# Patient Record
Sex: Female | Born: 1945
Health system: Southern US, Community
[De-identification: ages and names within clinical notes are randomized; demographics above are authoritative.]

## PROBLEM LIST (undated history)

## (undated) DIAGNOSIS — I10 Essential (primary) hypertension: Secondary | ICD-10-CM

## (undated) DIAGNOSIS — K5790 Diverticulosis of intestine, part unspecified, without perforation or abscess without bleeding: Secondary | ICD-10-CM

## (undated) DIAGNOSIS — E0590 Thyrotoxicosis, unspecified without thyrotoxic crisis or storm: Secondary | ICD-10-CM

## (undated) DIAGNOSIS — E039 Hypothyroidism, unspecified: Secondary | ICD-10-CM

## (undated) DIAGNOSIS — M542 Cervicalgia: Secondary | ICD-10-CM

## (undated) DIAGNOSIS — B351 Tinea unguium: Secondary | ICD-10-CM

## (undated) DIAGNOSIS — I447 Left bundle-branch block, unspecified: Secondary | ICD-10-CM

## (undated) DIAGNOSIS — H6120 Impacted cerumen, unspecified ear: Secondary | ICD-10-CM

## (undated) DIAGNOSIS — F411 Generalized anxiety disorder: Secondary | ICD-10-CM

## (undated) DIAGNOSIS — IMO0001 Reserved for inherently not codable concepts without codable children: Secondary | ICD-10-CM

## (undated) DIAGNOSIS — K644 Residual hemorrhoidal skin tags: Secondary | ICD-10-CM

## (undated) DIAGNOSIS — E041 Nontoxic single thyroid nodule: Secondary | ICD-10-CM

## (undated) DIAGNOSIS — R197 Diarrhea, unspecified: Secondary | ICD-10-CM

## (undated) DIAGNOSIS — C801 Malignant (primary) neoplasm, unspecified: Secondary | ICD-10-CM

## (undated) DIAGNOSIS — M545 Low back pain, unspecified: Secondary | ICD-10-CM

## (undated) DIAGNOSIS — L639 Alopecia areata, unspecified: Secondary | ICD-10-CM

## (undated) DIAGNOSIS — R350 Frequency of micturition: Secondary | ICD-10-CM

## (undated) DIAGNOSIS — L84 Corns and callosities: Secondary | ICD-10-CM

## (undated) DIAGNOSIS — H8110 Benign paroxysmal vertigo, unspecified ear: Secondary | ICD-10-CM

## (undated) DIAGNOSIS — S93529A Sprain of metatarsophalangeal joint of unspecified toe(s), initial encounter: Secondary | ICD-10-CM

## (undated) DIAGNOSIS — N6029 Fibroadenosis of unspecified breast: Secondary | ICD-10-CM

## (undated) DIAGNOSIS — E559 Vitamin D deficiency, unspecified: Secondary | ICD-10-CM

## (undated) DIAGNOSIS — M810 Age-related osteoporosis without current pathological fracture: Secondary | ICD-10-CM

## (undated) DIAGNOSIS — M797 Fibromyalgia: Secondary | ICD-10-CM

## (undated) DIAGNOSIS — M7750 Other enthesopathy of unspecified foot: Secondary | ICD-10-CM

## (undated) DIAGNOSIS — J310 Chronic rhinitis: Secondary | ICD-10-CM

## (undated) DIAGNOSIS — R1310 Dysphagia, unspecified: Secondary | ICD-10-CM

## (undated) DIAGNOSIS — M1990 Unspecified osteoarthritis, unspecified site: Secondary | ICD-10-CM

## (undated) DIAGNOSIS — M069 Rheumatoid arthritis, unspecified: Secondary | ICD-10-CM

## (undated) HISTORY — DX: Fibroadenosis of unspecified breast: N60.29

## (undated) HISTORY — DX: Generalized anxiety disorder: F41.1

## (undated) HISTORY — DX: Reserved for inherently not codable concepts without codable children: IMO0001

## (undated) HISTORY — PX: EYE SURGERY: SHX253

## (undated) HISTORY — DX: Essential (primary) hypertension: I10

## (undated) HISTORY — PX: COLONOSCOPY: SHX174

## (undated) HISTORY — DX: Low back pain: M54.5

## (undated) HISTORY — DX: Thyrotoxicosis, unspecified without thyrotoxic crisis or storm: E05.90

## (undated) HISTORY — DX: Alopecia areata, unspecified: L63.9

## (undated) HISTORY — PX: DG FINGERS, MULTIPLE LT HAND (ARMC HX): HXRAD1001

## (undated) HISTORY — DX: Tinea unguium: B35.1

## (undated) HISTORY — DX: Nontoxic single thyroid nodule: E04.1

## (undated) HISTORY — DX: Corns and callosities: L84

## (undated) HISTORY — DX: Rheumatoid arthritis, unspecified: M06.9

## (undated) HISTORY — DX: Impacted cerumen, unspecified ear: H61.20

## (undated) HISTORY — DX: Diverticulosis of intestine, part unspecified, without perforation or abscess without bleeding: K57.90

## (undated) HISTORY — DX: Frequency of micturition: R35.0

## (undated) HISTORY — DX: Vitamin D deficiency, unspecified: E55.9

## (undated) HISTORY — DX: Fibromyalgia: M79.7

## (undated) HISTORY — DX: Unspecified osteoarthritis, unspecified site: M19.90

## (undated) HISTORY — DX: Sprain of metatarsophalangeal joint of unspecified toe(s), initial encounter: S93.529A

## (undated) HISTORY — DX: Benign paroxysmal vertigo, unspecified ear: H81.10

## (undated) HISTORY — DX: Age-related osteoporosis without current pathological fracture: M81.0

## (undated) HISTORY — DX: Residual hemorrhoidal skin tags: K64.4

## (undated) HISTORY — DX: Dysphagia, unspecified: R13.10

## (undated) HISTORY — DX: Left bundle-branch block, unspecified: I44.7

## (undated) HISTORY — DX: Other enthesopathy of unspecified foot and ankle: M77.50

## (undated) HISTORY — DX: Cervicalgia: M54.2

## (undated) HISTORY — DX: Low back pain, unspecified: M54.50

## (undated) HISTORY — DX: Chronic rhinitis: J31.0

---

## 1971-09-05 HISTORY — PX: TUBAL LIGATION: SHX77

## 1972-09-04 HISTORY — PX: TONSILLECTOMY AND ADENOIDECTOMY: SUR1326

## 1995-09-05 HISTORY — PX: OTHER SURGICAL HISTORY: SHX169

## 1996-03-31 DIAGNOSIS — M7750 Other enthesopathy of unspecified foot: Secondary | ICD-10-CM

## 1996-03-31 HISTORY — DX: Other enthesopathy of unspecified foot and ankle: M77.50

## 1996-09-04 HISTORY — PX: OTHER SURGICAL HISTORY: SHX169

## 1998-03-23 ENCOUNTER — Ambulatory Visit (HOSPITAL_BASED_OUTPATIENT_CLINIC_OR_DEPARTMENT_OTHER): Admission: RE | Admit: 1998-03-23 | Discharge: 1998-03-23 | Payer: Self-pay | Admitting: Orthopedic Surgery

## 1998-03-26 ENCOUNTER — Other Ambulatory Visit: Admission: RE | Admit: 1998-03-26 | Discharge: 1998-03-26 | Payer: Self-pay | Admitting: Obstetrics and Gynecology

## 1999-04-07 ENCOUNTER — Other Ambulatory Visit: Admission: RE | Admit: 1999-04-07 | Discharge: 1999-04-07 | Payer: Self-pay | Admitting: Obstetrics and Gynecology

## 1999-07-27 ENCOUNTER — Other Ambulatory Visit: Admission: RE | Admit: 1999-07-27 | Discharge: 1999-07-27 | Payer: Self-pay | Admitting: Obstetrics and Gynecology

## 2000-03-31 ENCOUNTER — Encounter: Payer: Self-pay | Admitting: Internal Medicine

## 2000-03-31 ENCOUNTER — Encounter: Admission: RE | Admit: 2000-03-31 | Discharge: 2000-03-31 | Payer: Self-pay | Admitting: Internal Medicine

## 2000-04-05 ENCOUNTER — Encounter: Payer: Self-pay | Admitting: Internal Medicine

## 2000-04-05 ENCOUNTER — Encounter: Admission: RE | Admit: 2000-04-05 | Discharge: 2000-04-05 | Payer: Self-pay | Admitting: Internal Medicine

## 2000-07-06 ENCOUNTER — Encounter: Payer: Self-pay | Admitting: Obstetrics and Gynecology

## 2000-07-06 ENCOUNTER — Encounter: Admission: RE | Admit: 2000-07-06 | Discharge: 2000-07-06 | Payer: Self-pay | Admitting: Obstetrics and Gynecology

## 2000-07-31 ENCOUNTER — Other Ambulatory Visit: Admission: RE | Admit: 2000-07-31 | Discharge: 2000-07-31 | Payer: Self-pay | Admitting: Obstetrics and Gynecology

## 2000-12-04 ENCOUNTER — Encounter: Payer: Self-pay | Admitting: Neurosurgery

## 2000-12-04 ENCOUNTER — Encounter: Admission: RE | Admit: 2000-12-04 | Discharge: 2000-12-04 | Payer: Self-pay | Admitting: Neurosurgery

## 2000-12-10 ENCOUNTER — Encounter: Admission: RE | Admit: 2000-12-10 | Discharge: 2000-12-10 | Payer: Self-pay | Admitting: Neurosurgery

## 2000-12-10 ENCOUNTER — Encounter: Payer: Self-pay | Admitting: Neurosurgery

## 2001-07-15 ENCOUNTER — Encounter: Payer: Self-pay | Admitting: Neurosurgery

## 2001-07-15 ENCOUNTER — Encounter: Admission: RE | Admit: 2001-07-15 | Discharge: 2001-07-15 | Payer: Self-pay | Admitting: Neurosurgery

## 2001-07-17 ENCOUNTER — Encounter: Admission: RE | Admit: 2001-07-17 | Discharge: 2001-07-17 | Payer: Self-pay | Admitting: Obstetrics and Gynecology

## 2001-07-17 ENCOUNTER — Encounter: Payer: Self-pay | Admitting: Obstetrics and Gynecology

## 2001-09-12 ENCOUNTER — Other Ambulatory Visit: Admission: RE | Admit: 2001-09-12 | Discharge: 2001-09-12 | Payer: Self-pay | Admitting: Obstetrics and Gynecology

## 2002-08-22 ENCOUNTER — Encounter: Admission: RE | Admit: 2002-08-22 | Discharge: 2002-08-22 | Payer: Self-pay | Admitting: Obstetrics and Gynecology

## 2002-08-22 ENCOUNTER — Encounter: Payer: Self-pay | Admitting: Obstetrics and Gynecology

## 2002-09-01 ENCOUNTER — Ambulatory Visit (HOSPITAL_COMMUNITY): Admission: RE | Admit: 2002-09-01 | Discharge: 2002-09-01 | Payer: Self-pay | Admitting: Neurosurgery

## 2002-09-04 DIAGNOSIS — I10 Essential (primary) hypertension: Secondary | ICD-10-CM

## 2002-09-04 DIAGNOSIS — F411 Generalized anxiety disorder: Secondary | ICD-10-CM

## 2002-09-04 DIAGNOSIS — R1310 Dysphagia, unspecified: Secondary | ICD-10-CM

## 2002-09-04 HISTORY — DX: Generalized anxiety disorder: F41.1

## 2002-09-04 HISTORY — DX: Essential (primary) hypertension: I10

## 2002-09-04 HISTORY — DX: Dysphagia, unspecified: R13.10

## 2003-02-04 ENCOUNTER — Other Ambulatory Visit: Admission: RE | Admit: 2003-02-04 | Discharge: 2003-02-04 | Payer: Self-pay | Admitting: Obstetrics and Gynecology

## 2003-04-01 DIAGNOSIS — M1990 Unspecified osteoarthritis, unspecified site: Secondary | ICD-10-CM

## 2003-04-01 DIAGNOSIS — M810 Age-related osteoporosis without current pathological fracture: Secondary | ICD-10-CM

## 2003-04-01 DIAGNOSIS — H8110 Benign paroxysmal vertigo, unspecified ear: Secondary | ICD-10-CM

## 2003-04-01 DIAGNOSIS — N6029 Fibroadenosis of unspecified breast: Secondary | ICD-10-CM

## 2003-04-01 DIAGNOSIS — M542 Cervicalgia: Secondary | ICD-10-CM

## 2003-04-01 HISTORY — DX: Unspecified osteoarthritis, unspecified site: M19.90

## 2003-04-01 HISTORY — DX: Fibroadenosis of unspecified breast: N60.29

## 2003-04-01 HISTORY — DX: Cervicalgia: M54.2

## 2003-04-01 HISTORY — DX: Age-related osteoporosis without current pathological fracture: M81.0

## 2003-04-01 HISTORY — DX: Benign paroxysmal vertigo, unspecified ear: H81.10

## 2003-08-26 ENCOUNTER — Encounter: Admission: RE | Admit: 2003-08-26 | Discharge: 2003-08-26 | Payer: Self-pay | Admitting: Obstetrics and Gynecology

## 2004-02-18 ENCOUNTER — Other Ambulatory Visit: Admission: RE | Admit: 2004-02-18 | Discharge: 2004-02-18 | Payer: Self-pay | Admitting: Obstetrics and Gynecology

## 2004-10-05 DIAGNOSIS — M069 Rheumatoid arthritis, unspecified: Secondary | ICD-10-CM

## 2004-10-05 HISTORY — DX: Rheumatoid arthritis, unspecified: M06.9

## 2005-02-21 ENCOUNTER — Other Ambulatory Visit: Admission: RE | Admit: 2005-02-21 | Discharge: 2005-02-21 | Payer: Self-pay | Admitting: Obstetrics and Gynecology

## 2005-03-01 DIAGNOSIS — IMO0001 Reserved for inherently not codable concepts without codable children: Secondary | ICD-10-CM

## 2005-03-01 HISTORY — DX: Reserved for inherently not codable concepts without codable children: IMO0001

## 2005-09-01 DIAGNOSIS — J310 Chronic rhinitis: Secondary | ICD-10-CM

## 2005-09-01 HISTORY — DX: Chronic rhinitis: J31.0

## 2005-11-28 ENCOUNTER — Ambulatory Visit: Payer: Self-pay | Admitting: Internal Medicine

## 2005-12-01 ENCOUNTER — Ambulatory Visit: Payer: Self-pay | Admitting: Internal Medicine

## 2005-12-01 ENCOUNTER — Encounter (INDEPENDENT_AMBULATORY_CARE_PROVIDER_SITE_OTHER): Payer: Self-pay | Admitting: *Deleted

## 2005-12-01 LAB — HM COLONOSCOPY

## 2005-12-08 ENCOUNTER — Ambulatory Visit (HOSPITAL_COMMUNITY): Admission: RE | Admit: 2005-12-08 | Discharge: 2005-12-08 | Payer: Self-pay | Admitting: Internal Medicine

## 2006-01-01 ENCOUNTER — Ambulatory Visit: Payer: Self-pay | Admitting: Internal Medicine

## 2006-02-07 ENCOUNTER — Ambulatory Visit: Payer: Self-pay | Admitting: Internal Medicine

## 2006-02-16 DIAGNOSIS — L639 Alopecia areata, unspecified: Secondary | ICD-10-CM

## 2006-02-16 HISTORY — DX: Alopecia areata, unspecified: L63.9

## 2006-05-11 ENCOUNTER — Encounter: Admission: RE | Admit: 2006-05-11 | Discharge: 2006-05-11 | Payer: Self-pay | Admitting: Internal Medicine

## 2007-01-04 ENCOUNTER — Ambulatory Visit: Payer: Self-pay | Admitting: Internal Medicine

## 2009-07-16 ENCOUNTER — Encounter: Admission: RE | Admit: 2009-07-16 | Discharge: 2009-07-16 | Payer: Self-pay | Admitting: Neurosurgery

## 2009-11-19 ENCOUNTER — Encounter: Admission: RE | Admit: 2009-11-19 | Discharge: 2009-11-19 | Payer: Self-pay | Admitting: Obstetrics and Gynecology

## 2010-10-31 ENCOUNTER — Other Ambulatory Visit: Payer: Self-pay | Admitting: Obstetrics and Gynecology

## 2010-10-31 DIAGNOSIS — Z1231 Encounter for screening mammogram for malignant neoplasm of breast: Secondary | ICD-10-CM

## 2010-12-05 ENCOUNTER — Ambulatory Visit
Admission: RE | Admit: 2010-12-05 | Discharge: 2010-12-05 | Disposition: A | Discharge status: Home / Self Care | Payer: Medicare Other | Source: Ambulatory Visit | Attending: Obstetrics and Gynecology | Admitting: Obstetrics and Gynecology

## 2010-12-05 DIAGNOSIS — Z1231 Encounter for screening mammogram for malignant neoplasm of breast: Secondary | ICD-10-CM

## 2011-01-20 NOTE — Assessment & Plan Note (Signed)
[name]                         GASTROENTEROLOGY OFFICE NOTE   Joan, [name]                       MRN:          [id number]  DATE:01/04/2007                            DOB:          October 11, 1945    REFERRING PHYSICIAN:  Lemmie Evens, M.D.   PROBLEM:  Iron-deficiency anemia.   HISTORY:  Joan [name] is a very nice 65 year old white female known to Dr.  [name] from workup approximately one year ago at which time she was seen  for microcytic anemia. She underwent colonoscopy which showed sigmoid  colon diverticulosis and internal hemorrhoids and also had an upper  endoscopy which revealed an esophageal stricture which was dilated in  evidence for GERD.  The patient subsequently had small-bowel follow-  through which was also negative. She was treated with oral iron  supplementation.  She says that she thinks she took the iron for 3-4  months and then stopped taking it as her hemoglobin had returned to  normal.  She had recently had labs done with Dr. Jimmy Footman and was found  again to be anemic and, therefore, resumed iron tablets at 325 three  times a day.  Her most recent hemoglobin was 10.6, hematocrit 32.1, and  MCV 79.4.  If she had iron studies done, we do not have the results of  those today.  The patient says she feels fine and has no GI symptoms.  She has noted that her stool has been dark since starting the iron.  She  has ongoing difficulty with rheumatoid arthritis and fibromyalgia.   CURRENT MEDICATIONS:  1. Levothyroxine, uncertain dosage, 6 days per week.  2. Folic acid 1 mg daily.  3. Amitriptyline 10 mg nightly.  4. Arthrotec 75 mg daily.  5. Fosamax weekly.  6. Ferrous sulfate 325 mg 3 times a day.  7. Prednisone 5 mg daily.  8. Trental 50 b.i.d. p.r.n.  9. Enbrel weekly.   ALLERGIES:  Intolerance to PENICILLIN with rash.   PHYSICAL EXAMINATION:  GENERAL:  Well-developed, small white female in  no acute distress.  VITAL  SIGNS:  Weight 106. Blood pressure 124/82, pulse 92.  CARDIOVASCULAR:  Regular rate and rhythm with S1 and S2.  No murmurs,  rubs, or gallops.  PULMONARY:  Clear to auscultation and percussion.  ABDOMEN: Soft and nontender.  There is no palpable mass or  hepatosplenomegaly.  RECTAL:  Exam not done today.   IMPRESSION:  A 65 year old white female with recurrent microcytic  anemia, suspect iron deficiency anemia. Negative workup one year ago.  Suspect that she does have chronic intermittent blood loss with chronic  NSAID use accounting for recurrence of iron-deficiency anemia .   PLAN:  1. Continue ferrous sulfate 325 mg 3 times a day x4 months and repeat      hemoglobin at that time.  If her hemoglobin has normalized, we have      asked her to continue iron supplement but decrease dose to 325 once      daily or the minimal dose required to maintain a normal hemoglobin.  2. In the event that her hemoglobin and  iron studies do not correct,      please refer her back and will consider repeat endoscopy and/or      capsule endoscopy for further evaluation.  However, at this point,      do not feel that further endoscopic evaluation is warranted.      Mike Gip, PA-C  Electronically Signed      Wilhemina Bonito. [name], MD  Electronically Signed   AE/MedQ  DD: 01/07/2007  DT: 01/07/2007  Job #: [id number]   cc:   Lemmie Evens, M.D.  Lenon Curt Chilton Si, M.D.

## 2011-04-25 ENCOUNTER — Other Ambulatory Visit: Payer: Self-pay | Admitting: Obstetrics and Gynecology

## 2011-09-07 ENCOUNTER — Other Ambulatory Visit: Payer: Self-pay | Admitting: Obstetrics and Gynecology

## 2011-09-07 DIAGNOSIS — R102 Pelvic and perineal pain: Secondary | ICD-10-CM

## 2011-09-11 ENCOUNTER — Ambulatory Visit
Admission: RE | Admit: 2011-09-11 | Discharge: 2011-09-11 | Disposition: A | Discharge status: Home / Self Care | Payer: Medicare Other | Source: Ambulatory Visit | Attending: Obstetrics and Gynecology | Admitting: Obstetrics and Gynecology

## 2011-09-11 DIAGNOSIS — R102 Pelvic and perineal pain: Secondary | ICD-10-CM

## 2011-09-11 MED ORDER — IOHEXOL 300 MG/ML  SOLN
100.0000 mL | Freq: Once | INTRAMUSCULAR | Status: AC | PRN
  Administered 2011-09-11: 100 mL via INTRAVENOUS

## 2011-11-07 ENCOUNTER — Other Ambulatory Visit: Payer: Self-pay | Admitting: Obstetrics and Gynecology

## 2011-11-07 DIAGNOSIS — Z1231 Encounter for screening mammogram for malignant neoplasm of breast: Secondary | ICD-10-CM

## 2011-12-12 ENCOUNTER — Ambulatory Visit: Payer: Medicare Other

## 2011-12-15 ENCOUNTER — Ambulatory Visit
Admission: RE | Admit: 2011-12-15 | Discharge: 2011-12-15 | Disposition: A | Discharge status: Home / Self Care | Payer: Medicare Other | Source: Ambulatory Visit | Attending: Obstetrics and Gynecology | Admitting: Obstetrics and Gynecology

## 2011-12-15 DIAGNOSIS — Z1231 Encounter for screening mammogram for malignant neoplasm of breast: Secondary | ICD-10-CM

## 2012-05-22 ENCOUNTER — Other Ambulatory Visit: Payer: Self-pay | Admitting: Neurosurgery

## 2012-05-22 DIAGNOSIS — M549 Dorsalgia, unspecified: Secondary | ICD-10-CM

## 2012-05-31 ENCOUNTER — Ambulatory Visit
Admission: RE | Admit: 2012-05-31 | Discharge: 2012-05-31 | Disposition: A | Discharge status: Home / Self Care | Payer: Medicare Other | Source: Ambulatory Visit | Attending: Neurosurgery | Admitting: Neurosurgery

## 2012-05-31 DIAGNOSIS — M549 Dorsalgia, unspecified: Secondary | ICD-10-CM

## 2012-05-31 MED ORDER — IOHEXOL 180 MG/ML  SOLN
1.0000 mL | Freq: Once | INTRAMUSCULAR | Status: AC | PRN
  Administered 2012-05-31: 1 mL via EPIDURAL

## 2012-05-31 MED ORDER — METHYLPREDNISOLONE ACETATE 40 MG/ML INJ SUSP (RADIOLOG
120.0000 mg | Freq: Once | INTRAMUSCULAR | Status: AC
  Administered 2012-05-31: 120 mg via EPIDURAL

## 2012-05-31 NOTE — Discharge Instructions (Signed)

## 2012-11-01 ENCOUNTER — Encounter: Payer: Self-pay | Admitting: *Deleted

## 2012-11-07 ENCOUNTER — Other Ambulatory Visit: Payer: Self-pay

## 2012-11-07 DIAGNOSIS — Z1231 Encounter for screening mammogram for malignant neoplasm of breast: Secondary | ICD-10-CM

## 2012-11-21 ENCOUNTER — Other Ambulatory Visit: Payer: Self-pay | Admitting: Geriatric Medicine

## 2012-11-21 DIAGNOSIS — E0590 Thyrotoxicosis, unspecified without thyrotoxic crisis or storm: Secondary | ICD-10-CM

## 2012-11-29 ENCOUNTER — Other Ambulatory Visit: Payer: Self-pay

## 2012-11-29 DIAGNOSIS — Z1231 Encounter for screening mammogram for malignant neoplasm of breast: Secondary | ICD-10-CM

## 2012-11-29 DIAGNOSIS — E0590 Thyrotoxicosis, unspecified without thyrotoxic crisis or storm: Secondary | ICD-10-CM

## 2012-11-30 LAB — TSH: TSH: 0.007 u[IU]/mL — ABNORMAL LOW (ref 0.450–4.500)

## 2012-12-03 ENCOUNTER — Encounter: Payer: Self-pay | Admitting: Internal Medicine

## 2012-12-03 ENCOUNTER — Ambulatory Visit (INDEPENDENT_AMBULATORY_CARE_PROVIDER_SITE_OTHER): Payer: Medicare Other | Admitting: Internal Medicine

## 2012-12-03 ENCOUNTER — Other Ambulatory Visit: Payer: Self-pay | Admitting: Internal Medicine

## 2012-12-03 VITALS — BP 118/62 | HR 82 | Temp 98.5°F | Resp 18 | Ht 60.0 in | Wt 99.0 lb

## 2012-12-03 DIAGNOSIS — E0590 Thyrotoxicosis, unspecified without thyrotoxic crisis or storm: Secondary | ICD-10-CM | POA: Insufficient documentation

## 2012-12-03 NOTE — Patient Instructions (Signed)
Do not take any thyroid supplements

## 2012-12-03 NOTE — Progress Notes (Signed)
Subjective:     Patient ID: Joan [name], female   DOB: 1945/10/15, 67 y.o.   MRN: [id number]  HPI Current lab work shows an extremely low TSH of 0.007. She had an episode of hyperthyroidism several years ago and was evaluated with multiple tests at that time. More recently she has been hypothyroid. She denies any pain in the neck, tremor, headaches, difficulty swallowing, painful swallowing, shortness of breath, palpitations, significant weight loss, or GI symptoms. She stopped taking thyroid supplements approximately 6 weeks ago. Lab work continues to show the low TSH. Current Outpatient Prescriptions on File Prior to Visit  Medication Sig Dispense Refill  . traMADol (ULTRAM) 50 MG tablet Take 50 mg by mouth 4 (four) times daily. To help with pain       No current facility-administered medications on file prior to visit.    Review of Systems  Constitutional: Negative for fever, chills, weight loss and malaise/fatigue.  HENT: Negative for hearing loss, ear pain, congestion, sore throat, neck pain, tinnitus and ear discharge.   Eyes: Negative for blurred vision, double vision, photophobia and pain.  Respiratory: Negative for cough, hemoptysis, sputum production, shortness of breath and wheezing.   Cardiovascular: Negative for chest pain, palpitations, orthopnea, claudication, leg swelling and PND.  Gastrointestinal: Negative for heartburn, nausea, vomiting, abdominal pain, diarrhea and constipation.  Endocrine: Negative for polydipsia.  Genitourinary: Negative for dysuria, urgency and frequency.  Musculoskeletal: Positive for myalgias, back pain and joint pain. Negative for falls.  Skin: Negative for itching and rash.  Allergic/Immunologic: Negative for environmental allergies.  Neurological: Negative for dizziness, tingling, tremors, sensory change, speech change, focal weakness, seizures, loss of consciousness, weakness and headaches.  Hematological: Does not bruise/bleed easily.   Psychiatric/Behavioral: Negative for depression, suicidal ideas, hallucinations and memory loss. The patient is nervous/anxious. The patient does not have insomnia.    Review of Systems  Constitutional: Negative for fever, chills, weight loss and malaise/fatigue.  HENT: Negative for hearing loss, ear pain, congestion, sore throat, neck pain, tinnitus and ear discharge.   Eyes: Negative for blurred vision, double vision, photophobia and pain.  Respiratory: Negative for cough, hemoptysis, sputum production, shortness of breath and wheezing.   Cardiovascular: Negative for chest pain, palpitations, orthopnea, claudication, leg swelling and PND.  Gastrointestinal: Negative for heartburn, nausea, vomiting, abdominal pain, diarrhea and constipation.  Genitourinary: Negative for dysuria, urgency and frequency.  Musculoskeletal: Positive for myalgias, back pain and joint pain. Negative for falls.  Skin: Negative for itching and rash.  Neurological: Negative for dizziness, tingling, tremors, sensory change, speech change, focal weakness, seizures, loss of consciousness, weakness and headaches.  Endo/Heme/Allergies: Negative for environmental allergies and polydipsia. Does not bruise/bleed easily.  Psychiatric/Behavioral: Negative for depression, suicidal ideas, hallucinations and memory loss. The patient is nervous/anxious. The patient does not have insomnia.       Objective:BP 118/62  Pulse 82  Temp(Src) 98.5 F (36.9 C) (Oral)  Resp 18  Ht 5' (1.524 m)  Wt 99 lb (44.906 kg)  BMI 19.33 kg/m2  SpO2 99%    Physical Exam  Constitutional: She is oriented to person, place, and time. She appears well-developed and well-nourished.  HENT:  Head: Normocephalic.  Right Ear: External ear normal.  Left Ear: External ear normal.  Mouth/Throat: No oropharyngeal exudate.  Eyes: Conjunctivae and EOM are normal. Pupils are equal, round, and reactive to light.  Neck: Neck supple. No tracheal deviation  present. Thyromegaly present.  Cardiovascular: Normal rate, regular rhythm, normal heart sounds and intact distal  pulses.   Pulmonary/Chest: Effort normal and breath sounds normal. No stridor.  Abdominal: Soft. Bowel sounds are normal.  Musculoskeletal: She exhibits no edema and no tenderness.  Diffuse arthralgias. History of rheumatoid arthritis. Currently on Enbrel. Under the care of Dr. Dierdre Forth.  Lymphadenopathy:    She has no cervical adenopathy.  Neurological: She is oriented to person, place, and time. She has normal reflexes. No cranial nerve deficit. Coordination normal.  Skin: Skin is warm and dry. No rash noted.  Psychiatric: She has a normal mood and affect. Her behavior is normal. Judgment and thought content normal.       Assessment:     Asymptomatic hyperthyroid state    Plan:    Additional studies have been ordered: Radioactive iodine uptake, thyroid ultrasound, anti-thyroid peroxidase antibodies, TSH receptor antibodies, thyroid anti-thyroglobulin antibodies. Patient will return after completion of the studies.

## 2012-12-06 ENCOUNTER — Other Ambulatory Visit: Payer: Self-pay | Admitting: Geriatric Medicine

## 2012-12-06 DIAGNOSIS — E0590 Thyrotoxicosis, unspecified without thyrotoxic crisis or storm: Secondary | ICD-10-CM

## 2012-12-06 NOTE — Addendum Note (Signed)
Addended by: Conception Chancy R on: 12/06/2012 04:10 PM   Modules accepted: Orders

## 2012-12-18 ENCOUNTER — Encounter: Payer: Self-pay | Admitting: Internal Medicine

## 2012-12-18 DIAGNOSIS — E0590 Thyrotoxicosis, unspecified without thyrotoxic crisis or storm: Secondary | ICD-10-CM | POA: Insufficient documentation

## 2012-12-18 DIAGNOSIS — E041 Nontoxic single thyroid nodule: Secondary | ICD-10-CM | POA: Insufficient documentation

## 2012-12-18 DIAGNOSIS — I10 Essential (primary) hypertension: Secondary | ICD-10-CM | POA: Insufficient documentation

## 2012-12-18 LAB — THYROGLOBULIN ANTIBODY: Thyroglobulin Ab: 1.6 IU/mL — ABNORMAL HIGH (ref 0.0–0.9)

## 2012-12-18 LAB — THYROTROPIN RECEPTOR ANTIBODY: Thyrotropin Receptor Ab: 2.46 IU/L — ABNORMAL HIGH (ref 0.00–1.75)

## 2012-12-18 LAB — THYROID PEROXIDASE ANTIBODY: Thyroid Peroxidase Ab: 28 IU/mL (ref 0–34)

## 2012-12-18 NOTE — Addendum Note (Signed)
Addended by: Conception Chancy R on: 12/18/2012 03:47 PM   Modules accepted: Orders

## 2012-12-23 ENCOUNTER — Ambulatory Visit
Admission: RE | Admit: 2012-12-23 | Discharge: 2012-12-23 | Disposition: A | Discharge status: Home / Self Care | Payer: Medicare Other | Source: Ambulatory Visit

## 2012-12-26 ENCOUNTER — Ambulatory Visit (HOSPITAL_COMMUNITY)
Admission: RE | Admit: 2012-12-26 | Discharge: 2012-12-26 | Disposition: A | Discharge status: Home / Self Care | Payer: Medicare Other | Source: Ambulatory Visit | Attending: Internal Medicine | Admitting: Internal Medicine

## 2012-12-26 DIAGNOSIS — E0590 Thyrotoxicosis, unspecified without thyrotoxic crisis or storm: Secondary | ICD-10-CM

## 2012-12-26 DIAGNOSIS — E0510 Thyrotoxicosis with toxic single thyroid nodule without thyrotoxic crisis or storm: Secondary | ICD-10-CM | POA: Insufficient documentation

## 2013-01-06 ENCOUNTER — Encounter (HOSPITAL_COMMUNITY)
Admission: RE | Admit: 2013-01-06 | Discharge: 2013-01-06 | Disposition: A | Discharge status: Home / Self Care | Payer: Medicare Other | Source: Ambulatory Visit | Attending: Internal Medicine | Admitting: Internal Medicine

## 2013-01-06 ENCOUNTER — Encounter (HOSPITAL_COMMUNITY): Payer: Medicare Other

## 2013-01-06 DIAGNOSIS — E0500 Thyrotoxicosis with diffuse goiter without thyrotoxic crisis or storm: Secondary | ICD-10-CM | POA: Insufficient documentation

## 2013-01-06 DIAGNOSIS — E0590 Thyrotoxicosis, unspecified without thyrotoxic crisis or storm: Secondary | ICD-10-CM

## 2013-01-07 ENCOUNTER — Encounter (HOSPITAL_COMMUNITY): Payer: Medicare Other

## 2013-01-07 ENCOUNTER — Encounter (HOSPITAL_COMMUNITY)
Admission: RE | Admit: 2013-01-07 | Discharge: 2013-01-07 | Disposition: A | Discharge status: Home / Self Care | Payer: Medicare Other | Source: Ambulatory Visit | Attending: Internal Medicine | Admitting: Internal Medicine

## 2013-01-07 MED ORDER — SODIUM IODIDE I 131 CAPSULE
8.4000 | Freq: Once | INTRAVENOUS | Status: AC | PRN
  Administered 2013-01-06: 8.4 via ORAL

## 2013-01-07 MED ORDER — SODIUM PERTECHNETATE TC 99M INJECTION
10.4000 | Freq: Once | INTRAVENOUS | Status: AC | PRN
  Administered 2013-01-07: 10 via INTRAVENOUS

## 2013-01-15 ENCOUNTER — Encounter: Payer: Self-pay | Admitting: Internal Medicine

## 2013-01-15 ENCOUNTER — Encounter: Payer: Self-pay | Admitting: *Deleted

## 2013-01-15 ENCOUNTER — Ambulatory Visit (INDEPENDENT_AMBULATORY_CARE_PROVIDER_SITE_OTHER): Payer: Medicare Other | Admitting: Internal Medicine

## 2013-01-15 VITALS — BP 118/72 | HR 88 | Temp 98.5°F | Resp 14 | Ht 60.0 in | Wt 100.2 lb

## 2013-01-15 DIAGNOSIS — I10 Essential (primary) hypertension: Secondary | ICD-10-CM

## 2013-01-15 DIAGNOSIS — IMO0001 Reserved for inherently not codable concepts without codable children: Secondary | ICD-10-CM

## 2013-01-15 DIAGNOSIS — E0590 Thyrotoxicosis, unspecified without thyrotoxic crisis or storm: Secondary | ICD-10-CM

## 2013-01-15 DIAGNOSIS — R197 Diarrhea, unspecified: Secondary | ICD-10-CM

## 2013-01-15 DIAGNOSIS — M069 Rheumatoid arthritis, unspecified: Secondary | ICD-10-CM

## 2013-01-15 DIAGNOSIS — E041 Nontoxic single thyroid nodule: Secondary | ICD-10-CM | POA: Insufficient documentation

## 2013-01-15 MED ORDER — DIPHENOXYLATE-ATROPINE 2.5-0.025 MG PO TABS: 1.0000 | ORAL_TABLET | Freq: Four times a day (QID) | ORAL | Status: DC | PRN

## 2013-01-15 MED ORDER — PREDNISONE 5 MG PO TABS: 5.0000 mg | ORAL_TABLET | Freq: Every day | ORAL | Status: DC

## 2013-01-15 NOTE — Patient Instructions (Signed)
See Dr Altheimer about the thyroid nodule.

## 2013-01-15 NOTE — Progress Notes (Signed)
Subjective:    Patient ID: Joan [name], female    DOB: 02-25-1946, 67 y.o.   MRN: [id number]  HPI Has a cold nodule on recent evaluation of hyperthyroid state. Has a tremor, but it has not increased. Some intolerance to heat. Denies difficulty sleeping. Complains of diarrhea. Multiple loose stools. No blood. History of diverticulosis. Has not responded to Imodium. BP doing OK. Sticking with Enbrel for RA. Sees Dr. Dierdre Forth.  Review of Systems  Constitutional: Positive for unexpected weight change. Negative for fever, chills, weight loss and malaise/fatigue.       Losing weight.  HENT: Negative for hearing loss, ear pain, congestion, sore throat, neck pain, tinnitus and ear discharge.   Eyes: Negative for blurred vision, double vision, photophobia and pain.  Respiratory: Negative for cough, hemoptysis, sputum production, shortness of breath and wheezing.   Cardiovascular: Negative for chest pain, palpitations, orthopnea, claudication, leg swelling and PND.  Gastrointestinal: Positive for diarrhea. Negative for heartburn, nausea, vomiting, abdominal pain and constipation.  Endocrine: Negative for polydipsia.  Genitourinary: Negative for dysuria, urgency and frequency.  Musculoskeletal: Positive for myalgias, back pain and joint pain. Negative for falls.  Skin: Negative for itching and rash.  Allergic/Immunologic: Negative for environmental allergies.  Neurological: Negative for dizziness, tingling, tremors, sensory change, speech change, focal weakness, seizures, loss of consciousness, weakness and headaches.  Hematological: Does not bruise/bleed easily.  Psychiatric/Behavioral: Negative for depression, suicidal ideas, hallucinations and memory loss. The patient is nervous/anxious. The patient does not have insomnia.          Objective:BP 118/72  Pulse 88  Temp(Src) 98.5 F (36.9 C) (Oral)  Resp 14  Ht 5' (1.524 m)  Wt 100 lb 3.2 oz (45.45 kg)  BMI 19.57 kg/m2    Physical  Exam  Constitutional: She is oriented to person, place, and time. She appears well-developed and well-nourished.  HENT:  Head: Normocephalic.  Right Ear: External ear normal.  Left Ear: External ear normal.  Mouth/Throat: No oropharyngeal exudate.  Eyes: Conjunctivae and EOM are normal. Pupils are equal, round, and reactive to light.  Neck: Neck supple. No tracheal deviation present. Thyromegaly present.  Cardiovascular: Normal rate, regular rhythm, normal heart sounds and intact distal pulses.   Pulmonary/Chest: Effort normal and breath sounds normal. No stridor.  Abdominal: Soft. Bowel sounds are normal.  Musculoskeletal: She exhibits no edema and no tenderness.  Diffuse arthralgias. History of rheumatoid arthritis. Currently on Enbrel. Under the care of Dr. Dierdre Forth.  Lymphadenopathy:    She has no cervical adenopathy.  Neurological: She is oriented to person, place, and time. She has normal reflexes. No cranial nerve deficit. Coordination normal.  Skin: Skin is warm and dry. No rash noted.  Psychiatric: She has a normal mood and affect. Her behavior is normal. Judgment and thought content normal.    Orders Only on 12/03/2012  Component Date Value Range Status  . Thyroglobulin Ab 12/03/2012 1.6* 0.0 - 0.9 IU/mL Final   Comment: Low positive Thyroglobulin antibodies are seen in a portion of the                          asymptomatic populations.                          Antithyroglobulin antibodies measured by [facility] Methodology  . Thyrotropin Receptor Ab 12/03/2012 2.46* 0.00 - 1.75 IU/L Final  . Thyroid Peroxidase Ab 12/03/2012 28  0 -  34 IU/mL Final  Appointment on 11/29/2012  Component Date Value Range Status  . TSH 11/29/2012 0.007* 0.450 - 4.500 uIU/mL Final   Orders Only on 12/03/2012  Component Date Value Range Status  . Thyroglobulin Ab 12/03/2012 1.6* 0.0 - 0.9 IU/mL Final   Comment: Low positive Thyroglobulin antibodies are seen in a portion of the                           asymptomatic populations.                          Antithyroglobulin antibodies measured by [facility] Methodology  . Thyrotropin Receptor Ab 12/03/2012 2.46* 0.00 - 1.75 IU/L Final  . Thyroid Peroxidase Ab 12/03/2012 28  0 - 34 IU/mL Final  Appointment on 11/29/2012  Component Date Value Range Status  . TSH 11/29/2012 0.007* 0.450 - 4.500 uIU/mL Final   01/07/13 Thyroid ULTRASOUND   Technique: Ultrasound examination of the thyroid gland and adjacent soft tissues was performed.   Comparison:  None   Findings:   Right thyroid lobe:  4.9 x 2.1 x 2.7 cm.  Heterogeneous echogenicity. Left thyroid lobe:  5.0 x 2.7 x 1.8 cm.  Heterogeneous echogenicity. Isthmus:  4 mm thick   Focal nodules:  Isoechoic to hypoechoic noncalcified nodule at mid to inferior right thyroid lobe, 2.7 x 1.6 x 2.1 cm.  No additional discrete thyroid mass/nodule, cyst or calcification.   Lymphadenopathy:  None identified   IMPRESSION: Solitary right thyroid nodule 2.7 x 1.6 x 2.1 cm. Findings meet consensus criteria for biopsy.  Ultrasound-guided fine needle aspiration should be considered, as per the consensus statement: Management of Thyroid Nodules Detected at [place]:  Society of Radiologists in Ultrasound Consensus Conference Statement. Radiology 2005; 161:096-045.  12/26/12 THYROID SCAN AND UPTAKE - 24 HOURS   Technique:  Following the per oral administration of I-131 Sodium Iodide, the patient returned at 24 hours and uptake measurements were acquired with the uptake probe centered on the neck.  Thyroid imaging was performed following the intravenous administration of the Tc-70m Pertechnetate.   Radiopharmaceuticals: 8.4uCi I-131 Sodium Iodide orally and10.80mCi Tc-68m Pertechnetate intravenously.   Comparison:  Thyroid ultrasound 12/26/2012.   Findings:  The 24 hour uptake by the thyroid gland is 14.3%. Normal 24 hour uptake range is 10-35%.   The thyroid scan demonstrates fairly  homogeneous activity throughout the left lobe.  There is decreased activity throughout much of the right lobe, suspicious for a cold right thyroid nodule as correlated with prior ultrasound.  No focally increased activity is identified.   IMPRESSION: 1.  Normal (14%) 24 hour radioiodine uptake by the thyroid gland.   2.  Decreased activity in the right thyroid lobe suspicious for a cold nodule corresponding with the 2.7 cm lesion demonstrated on ultrasound.  This increases the need for fine-needle aspiration.        Assessment & Plan:  Hyperthyroidism  See endocrine Thyrotoxicosis  See endocrine Myalgia and myositis, unspecified  unchanged Rheumatoid arthritis  Continue with Dr. Dierdre Forth Nontoxic uninodular goiter  Now in toxic range. Hypertension  stable Cold thyroid nodule  Needs biopsy. Referred to endocrine. Patient would like Dr. Leslie Dales if possible. Diarrhea.  Possible related to hyperthyroidism.   Add Lomotil

## 2013-02-03 ENCOUNTER — Other Ambulatory Visit: Payer: Self-pay | Admitting: Endocrinology

## 2013-02-03 DIAGNOSIS — E041 Nontoxic single thyroid nodule: Secondary | ICD-10-CM

## 2013-02-05 ENCOUNTER — Other Ambulatory Visit (HOSPITAL_COMMUNITY)
Admission: RE | Admit: 2013-02-05 | Discharge: 2013-02-05 | Disposition: A | Discharge status: Home / Self Care | Payer: Medicare Other | Source: Ambulatory Visit | Attending: Interventional Radiology | Admitting: Interventional Radiology

## 2013-02-05 ENCOUNTER — Ambulatory Visit
Admission: RE | Admit: 2013-02-05 | Discharge: 2013-02-05 | Disposition: A | Discharge status: Home / Self Care | Payer: Medicare Other | Source: Ambulatory Visit | Attending: Endocrinology | Admitting: Endocrinology

## 2013-02-05 DIAGNOSIS — E041 Nontoxic single thyroid nodule: Secondary | ICD-10-CM

## 2013-02-12 ENCOUNTER — Other Ambulatory Visit: Payer: Self-pay | Admitting: Endocrinology

## 2013-02-12 DIAGNOSIS — E0590 Thyrotoxicosis, unspecified without thyrotoxic crisis or storm: Secondary | ICD-10-CM

## 2013-02-21 ENCOUNTER — Other Ambulatory Visit: Payer: Self-pay | Admitting: Internal Medicine

## 2013-02-25 ENCOUNTER — Ambulatory Visit: Payer: Medicare Other | Admitting: Internal Medicine

## 2013-02-27 ENCOUNTER — Ambulatory Visit (HOSPITAL_COMMUNITY): Payer: Medicare Other

## 2013-02-28 ENCOUNTER — Encounter: Payer: Self-pay | Admitting: *Deleted

## 2013-03-05 ENCOUNTER — Ambulatory Visit (INDEPENDENT_AMBULATORY_CARE_PROVIDER_SITE_OTHER): Payer: Medicare Other | Admitting: Internal Medicine

## 2013-03-05 ENCOUNTER — Encounter: Payer: Self-pay | Admitting: Internal Medicine

## 2013-03-05 VITALS — BP 112/62 | HR 76 | Temp 98.3°F | Resp 14 | Ht 60.0 in | Wt 100.0 lb

## 2013-03-05 DIAGNOSIS — E0590 Thyrotoxicosis, unspecified without thyrotoxic crisis or storm: Secondary | ICD-10-CM

## 2013-03-05 DIAGNOSIS — M069 Rheumatoid arthritis, unspecified: Secondary | ICD-10-CM

## 2013-03-05 MED ORDER — FLUTICASONE PROPIONATE 50 MCG/ACT NA SUSP: 1.0000 | Freq: Every day | NASAL | Status: DC

## 2013-03-05 MED ORDER — PREDNISONE 5 MG PO TABS: 5.0000 mg | ORAL_TABLET | Freq: Every day | ORAL | Status: DC

## 2013-03-05 MED ORDER — DIPHENOXYLATE-ATROPINE 2.5-0.025 MG PO TABS: 1.0000 | ORAL_TABLET | Freq: Four times a day (QID) | ORAL | Status: DC

## 2013-03-05 NOTE — Patient Instructions (Signed)
Continue to see Dr. Leslie Dales.

## 2013-03-21 ENCOUNTER — Encounter (HOSPITAL_COMMUNITY)
Admission: RE | Admit: 2013-03-21 | Discharge: 2013-03-21 | Disposition: A | Discharge status: Home / Self Care | Payer: Medicare Other | Source: Ambulatory Visit | Attending: Endocrinology | Admitting: Endocrinology

## 2013-03-21 DIAGNOSIS — E0590 Thyrotoxicosis, unspecified without thyrotoxic crisis or storm: Secondary | ICD-10-CM | POA: Insufficient documentation

## 2013-03-21 DIAGNOSIS — E041 Nontoxic single thyroid nodule: Secondary | ICD-10-CM | POA: Insufficient documentation

## 2013-03-21 MED ORDER — SODIUM IODIDE I 131 CAPSULE
29.5000 | Freq: Once | INTRAVENOUS | Status: AC | PRN
  Administered 2013-03-21: 29.5 via ORAL

## 2013-03-27 ENCOUNTER — Other Ambulatory Visit: Payer: Self-pay | Admitting: Internal Medicine

## 2013-03-28 ENCOUNTER — Other Ambulatory Visit: Payer: Self-pay | Admitting: Geriatric Medicine

## 2013-04-26 NOTE — Progress Notes (Signed)
Subjective:    Patient ID: Joan [name], female    DOB: 1945-09-11, 67 y.o.   MRN: [id number]  HPI Patient returns to the office for followup of hyperthyroidism. She has seen Dr. Leslie Dales. The biopsy of the thyroid was done 02/05/13 with findings consistent with a nonneoplastic disorder in regards to the inferior right lobe nodule. She has a followup appointment with Dr. Leslie Dales in 4 weeks.  She is mildly tremulous and maintained her weight at about 100 pounds. She denies rapid palpitations or dyspnea.  Current Outpatient Prescriptions on File Prior to Visit  Medication Sig Dispense Refill  . acetaminophen (TYLENOL) 500 MG tablet Take 500 mg by mouth every 6 (six) hours as needed for pain. Take two tablets up to four times a day as needed      . amitriptyline (ELAVIL) 10 MG tablet Take one tablet once at bedtime to help relax and rest.      . Calcium Carb-Cholecalciferol (CALCIUM 500 +D PO) Take 500 mg by mouth daily. Take one tablet once a daily for bone health.      . Diclofenac-Misoprostol 75-0.2 MG TBEC Take 50 mg by mouth daily. Take 1 tablet twice daily for arthritis.      Marland Kitchen ENBREL SURECLICK 50 MG/ML injection Inject 50 mg as directed once a week. Inject once weekly for arthritis      . EVISTA 60 MG tablet Take one tablet once daily for osteoporosis      . folic acid (FOLVITE) 1 MG tablet Take 1 mg by mouth daily. Take two tablets every morning.      . Glucosamine-Chondroit-Vit C-Mn (GLUCOSAMINE CHONDR 500 COMPLEX PO) Take 500 mg by mouth daily. Take 2 tablets daily for joint health.      . hydrochlorothiazide (MICROZIDE) 12.5 MG capsule Take 12.5 mg by mouth daily. Take 1 tablet once daily for blood pressure.      . miconazole (MICOTIN) 2 % cream Apply 1 application topically 2 (two) times daily. Apply to thumbnail daily.      . Multiple Vitamin (MULTIVITAMIN) tablet Take 1 tablet by mouth daily. Take 1 tablet once a daily      . Omega-3 Fatty Acids (FISH OIL) 1000 MG CAPS Take 1,000  mg by mouth daily. Take one tablet once a daily for heart, joint and skin health.      . Salicylic Acid 10 % CREA Apply topically. Apply to callous daily      . traMADol (ULTRAM) 50 MG tablet Take 50 mg by mouth 4 (four) times daily. To help with pain       No current facility-administered medications on file prior to visit.    Review of Systems  Constitutional: Positive for unexpected weight change. Negative for fever, chills, weight loss and malaise/fatigue.       Losing weight.  HENT: Negative for hearing loss, ear pain, congestion, sore throat, neck pain, tinnitus and ear discharge.   Eyes: Negative for blurred vision, double vision, photophobia and pain.  Respiratory: Negative for cough, hemoptysis, sputum production, shortness of breath and wheezing.   Cardiovascular: Negative for chest pain, palpitations, orthopnea, claudication, leg swelling and PND.  Gastrointestinal: Positive for diarrhea. Negative for heartburn, nausea, vomiting, abdominal pain and constipation.  Endocrine: Negative for polydipsia.  Genitourinary: Negative for dysuria, urgency and frequency.  Musculoskeletal: Positive for myalgias, back pain and joint pain. Negative for falls.  Skin: Negative for itching and rash.  Allergic/Immunologic: Negative for environmental allergies.  Neurological: Negative for dizziness, tingling,  tremors, sensory change, speech change, focal weakness, seizures, loss of consciousness, weakness and headaches.  Hematological: Does not bruise/bleed easily.  Psychiatric/Behavioral: Negative for depression, suicidal ideas, hallucinations and memory loss. The patient is nervous/anxious. The patient does not have insomnia.        Objective:BP 112/62  Pulse 76  Temp(Src) 98.3 F (36.8 C) (Oral)  Resp 14  Ht 5' (1.524 m)  Wt 100 lb (45.36 kg)  BMI 19.53 kg/m2  SpO2 97%      Physical Exam  Constitutional: She is oriented to person, place, and time. She appears well-developed and  well-nourished.  HENT:  Head: Normocephalic.  Right Ear: External ear normal.  Left Ear: External ear normal.  Mouth/Throat: No oropharyngeal exudate.  Eyes: Conjunctivae and EOM are normal. Pupils are equal, round, and reactive to light.  Neck: Neck supple. No tracheal deviation present. Thyromegaly present.  Cardiovascular: Normal rate, regular rhythm, normal heart sounds and intact distal pulses.   Pulmonary/Chest: Effort normal and breath sounds normal. No stridor.  Abdominal: Soft. Bowel sounds are normal.  Musculoskeletal: She exhibits no edema and no tenderness.  Diffuse arthralgias. History of rheumatoid arthritis. Currently on Enbrel. Under the care of Dr. Dierdre Forth.  Lymphadenopathy:    She has no cervical adenopathy.  Neurological: She is oriented to person, place, and time. She has normal reflexes. No cranial nerve deficit. Coordination normal.  Skin: Skin is warm and dry. No rash noted.  Psychiatric: She has a normal mood and affect. Her behavior is normal. Judgment and thought content normal.      No visits with results within 3 Month(s) from this visit. Latest known visit with results is:  Orders Only on 12/03/2012  Component Date Value Range Status  . Thyroglobulin Ab 12/03/2012 1.6* 0.0 - 0.9 IU/mL Final   Comment: Low positive Thyroglobulin antibodies are seen in a portion of the                          asymptomatic populations.                          Antithyroglobulin antibodies measured by [facility] Methodology  . Thyrotropin Receptor Ab 12/03/2012 2.46* 0.00 - 1.75 IU/L Final  . Thyroid Peroxidase Ab 12/03/2012 28  0 - 34 IU/mL Final   02/05/2013 cytopathology report. Fine needle aspiration right thyroid: Findings consistent with nonneoplastic goiter. 2 x 7 x 1.6 x 2.1 cm solitary inferior right lobe nodule     Assessment & Plan:  Hyperthyroidism: Continue followup visit with Dr. Leslie Dales. He will make final recommendations regarding treatment of her  hyperthyroid state and the thyroid nodule.  Rheumatoid arthritis(714.0): Stable without flare

## 2013-05-06 ENCOUNTER — Other Ambulatory Visit: Payer: Self-pay | Admitting: *Deleted

## 2013-05-06 MED ORDER — TRAMADOL HCL 50 MG PO TABS: 50.0000 mg | ORAL_TABLET | Freq: Four times a day (QID) | ORAL | Status: DC

## 2013-05-25 ENCOUNTER — Other Ambulatory Visit: Payer: Self-pay | Admitting: Internal Medicine

## 2013-07-08 ENCOUNTER — Encounter: Payer: Self-pay | Admitting: Internal Medicine

## 2013-07-08 ENCOUNTER — Ambulatory Visit (INDEPENDENT_AMBULATORY_CARE_PROVIDER_SITE_OTHER): Payer: Medicare Other | Admitting: Internal Medicine

## 2013-07-08 VITALS — BP 122/72 | HR 89 | Temp 98.4°F | Ht 58.5 in | Wt 104.4 lb

## 2013-07-08 DIAGNOSIS — I10 Essential (primary) hypertension: Secondary | ICD-10-CM

## 2013-07-08 DIAGNOSIS — R197 Diarrhea, unspecified: Secondary | ICD-10-CM

## 2013-07-08 DIAGNOSIS — M069 Rheumatoid arthritis, unspecified: Secondary | ICD-10-CM

## 2013-07-08 DIAGNOSIS — E0590 Thyrotoxicosis, unspecified without thyrotoxic crisis or storm: Secondary | ICD-10-CM

## 2013-07-08 MED ORDER — DIPHENOXYLATE-ATROPINE 2.5-0.025 MG PO TABS: ORAL_TABLET | ORAL | Status: DC

## 2013-07-08 NOTE — Progress Notes (Signed)
Subjective:    Patient ID: Joan [name], female    DOB: 25-Apr-1946, 67 y.o.   MRN: [id number]  Chief Complaint  Patient presents with  . Medical Managment of Chronic Issues    4 month follow-up    HPI  Hypertension: controlled  Hyperthyroidism: had NM RAI 03/21/13. Followed by Dr. Leslie Dales. Still having 2-3 loose stools daily. No tremor. No palpitations  Rheumatoid arthritis(714.0): followed by Dr. Dierdre Forth. Using Enbrel. He proposed Remicaid, but she does not want to use the Remicaid.  Still with callous on the plantar aspect of the foot.  She says she had the pneumonia vaccine 13- valent in [place].   Current Outpatient Prescriptions on File Prior to Visit  Medication Sig Dispense Refill  . acetaminophen (TYLENOL) 500 MG tablet Take 500 mg by mouth every 6 (six) hours as needed for pain. Take two tablets up to four times a day as needed      . amitriptyline (ELAVIL) 10 MG tablet Take one tablet once at bedtime to help relax and rest.      . Calcium Carb-Cholecalciferol (CALCIUM 500 +D PO) Take 500 mg by mouth daily. Take one tablet once a daily for bone health.      . Diclofenac-Misoprostol 75-0.2 MG TBEC Take 50 mg by mouth daily. Take 1 tablet twice daily for arthritis.      Marland Kitchen diphenoxylate-atropine (LOMOTIL) 2.5-0.025 MG per tablet TAKE ONE TABLET BY MOUTH 4 TIMES DAILY AS NEEDED FOR DIARRHEA OR LOOSE STOOLS.  50 tablet  0  . ENBREL SURECLICK 50 MG/ML injection Inject 50 mg as directed once a week. Inject once weekly for arthritis      . EVISTA 60 MG tablet Take one tablet once daily for osteoporosis      . fluticasone (FLONASE) 50 MCG/ACT nasal spray Place 1 spray into the nose daily.  16 g  3  . folic acid (FOLVITE) 1 MG tablet Take 1 mg by mouth daily. Take two tablets every morning.      . Glucosamine-Chondroit-Vit C-Mn (GLUCOSAMINE CHONDR 500 COMPLEX PO) Take 500 mg by mouth daily. Take 2 tablets daily for joint health.      . hydrochlorothiazide (MICROZIDE) 12.5 MG  capsule TAKE ONE CAPSULE BY MOUTH EVERY DAY FOR BLOOD PRESSURE  90 capsule  1  . miconazole (MICOTIN) 2 % cream Apply 1 application topically 2 (two) times daily. Apply to thumbnail daily.      . Multiple Vitamin (MULTIVITAMIN) tablet Take 1 tablet by mouth daily. Take 1 tablet once a daily      . Omega-3 Fatty Acids (FISH OIL) 1000 MG CAPS Take 1,000 mg by mouth daily. Take one tablet once a daily for heart, joint and skin health.      . predniSONE (DELTASONE) 5 MG tablet Take 1 tablet (5 mg total) by mouth daily. Taken for arthritis  90 tablet  4  . Salicylic Acid 10 % CREA Apply topically. Apply to callous daily      . traMADol (ULTRAM) 50 MG tablet Take 1 tablet (50 mg total) by mouth 4 (four) times daily. To help with pain  120 tablet  5   No current facility-administered medications on file prior to visit.    Review of Systems  Constitutional: Negative for fever, chills, weight loss, malaise/fatigue and unexpected weight change.       Regaining. weight.  HENT: Negative for congestion, ear discharge, ear pain, hearing loss, sore throat and tinnitus.   Eyes: Negative for blurred  vision, double vision, photophobia and pain.  Respiratory: Negative for cough, hemoptysis, sputum production, shortness of breath and wheezing.   Cardiovascular: Negative for chest pain, palpitations, orthopnea, claudication, leg swelling and PND.  Gastrointestinal: Positive for diarrhea. Negative for heartburn, nausea, vomiting, abdominal pain and constipation.  Endocrine: Negative for polydipsia.  Genitourinary: Negative for dysuria, urgency and frequency.  Musculoskeletal: Positive for back pain, joint pain and myalgias. Negative for falls and neck pain.  Skin: Negative for itching and rash.  Allergic/Immunologic: Negative for environmental allergies.  Neurological: Negative for dizziness, tingling, tremors, sensory change, speech change, focal weakness, seizures, loss of consciousness, weakness and headaches.   Hematological: Does not bruise/bleed easily.  Psychiatric/Behavioral: Negative for depression, suicidal ideas, hallucinations and memory loss. The patient is nervous/anxious. The patient does not have insomnia.        Objective:BP 122/72  Pulse 89  Temp(Src) 98.4 F (36.9 C) (Oral)  Ht 4' 10.5" (1.486 m)  Wt 104 lb 6.4 oz (47.356 kg)  BMI 21.45 kg/m2  SpO2 98%    Physical Exam  Constitutional: She is oriented to person, place, and time. She appears well-developed and well-nourished.  HENT:  Head: Normocephalic.  Right Ear: External ear normal.  Left Ear: External ear normal.  Mouth/Throat: No oropharyngeal exudate.  Eyes: Conjunctivae and EOM are normal. Pupils are equal, round, and reactive to light.  Neck: Neck supple. No tracheal deviation present. Thyromegaly present.  Cardiovascular: Normal rate, regular rhythm, normal heart sounds and intact distal pulses.   Pulmonary/Chest: Effort normal and breath sounds normal. No stridor.  Abdominal: Soft. Bowel sounds are normal.  Musculoskeletal: She exhibits no edema and no tenderness.  Diffuse arthralgias. History of rheumatoid arthritis. Currently on Enbrel. Under the care of Dr. Dierdre Forth.  Lymphadenopathy:    She has no cervical adenopathy.  Neurological: She is oriented to person, place, and time. She has normal reflexes. No cranial nerve deficit. Coordination normal.  Skin: Skin is warm and dry. No rash noted.  Psychiatric: She has a normal mood and affect. Her behavior is normal. Judgment and thought content normal.          Assessment & Plan:  Hypertension: controlled  Hyperthyroidism: continue follow up with Dr. Leslie Dales in Dec 2014.  Rheumatoid arthritis(714.0): continue Enbrel and follow up with Dr. Dierdre Forth.

## 2013-07-08 NOTE — Patient Instructions (Signed)
Continue current medications. 

## 2013-07-29 ENCOUNTER — Encounter: Payer: Self-pay | Admitting: Internal Medicine

## 2013-09-17 ENCOUNTER — Other Ambulatory Visit: Payer: Self-pay | Admitting: Internal Medicine

## 2013-11-03 ENCOUNTER — Other Ambulatory Visit: Payer: 59

## 2013-11-03 DIAGNOSIS — E0590 Thyrotoxicosis, unspecified without thyrotoxic crisis or storm: Secondary | ICD-10-CM

## 2013-11-03 DIAGNOSIS — I10 Essential (primary) hypertension: Secondary | ICD-10-CM

## 2013-11-03 DIAGNOSIS — M069 Rheumatoid arthritis, unspecified: Secondary | ICD-10-CM

## 2013-11-04 LAB — COMPREHENSIVE METABOLIC PANEL
A/G RATIO: 1.5 (ref 1.1–2.5)
ALBUMIN: 3.5 g/dL — AB (ref 3.6–4.8)
ALK PHOS: 55 IU/L (ref 39–117)
ALT: 10 IU/L (ref 0–32)
AST: 13 IU/L (ref 0–40)
BILIRUBIN TOTAL: 0.3 mg/dL (ref 0.0–1.2)
BUN / CREAT RATIO: 27 — AB (ref 11–26)
BUN: 17 mg/dL (ref 8–27)
CO2: 28 mmol/L (ref 18–29)
Calcium: 8.5 mg/dL — ABNORMAL LOW (ref 8.7–10.3)
Chloride: 99 mmol/L (ref 97–108)
Creatinine, Ser: 0.63 mg/dL (ref 0.57–1.00)
GFR, EST AFRICAN AMERICAN: 108 mL/min/{1.73_m2} (ref >59)
GFR, EST NON AFRICAN AMERICAN: 94 mL/min/{1.73_m2} (ref >59)
GLUCOSE: 82 mg/dL (ref 65–99)
Globulin, Total: 2.3 g/dL (ref 1.5–4.5)
Potassium: 4.4 mmol/L (ref 3.5–5.2)
Sodium: 142 mmol/L (ref 134–144)
TOTAL PROTEIN: 5.8 g/dL — AB (ref 6.0–8.5)

## 2013-11-04 LAB — TSH: TSH: 2.63 u[IU]/mL (ref 0.450–4.500)

## 2013-11-05 ENCOUNTER — Other Ambulatory Visit: Payer: Medicare Other

## 2013-11-07 ENCOUNTER — Encounter: Payer: Self-pay | Admitting: *Deleted

## 2013-11-11 ENCOUNTER — Ambulatory Visit (INDEPENDENT_AMBULATORY_CARE_PROVIDER_SITE_OTHER): Payer: 59 | Admitting: Internal Medicine

## 2013-11-11 ENCOUNTER — Encounter: Payer: Self-pay | Admitting: Internal Medicine

## 2013-11-11 VITALS — BP 136/72 | HR 81 | Temp 98.4°F | Ht 58.5 in | Wt 107.4 lb

## 2013-11-11 DIAGNOSIS — IMO0001 Reserved for inherently not codable concepts without codable children: Secondary | ICD-10-CM

## 2013-11-11 DIAGNOSIS — E0590 Thyrotoxicosis, unspecified without thyrotoxic crisis or storm: Secondary | ICD-10-CM

## 2013-11-11 DIAGNOSIS — R197 Diarrhea, unspecified: Secondary | ICD-10-CM

## 2013-11-11 DIAGNOSIS — L84 Corns and callosities: Secondary | ICD-10-CM

## 2013-11-11 DIAGNOSIS — I10 Essential (primary) hypertension: Secondary | ICD-10-CM

## 2013-11-11 DIAGNOSIS — M069 Rheumatoid arthritis, unspecified: Secondary | ICD-10-CM

## 2013-11-11 MED ORDER — UREA 20 % EX CREA: TOPICAL_CREAM | CUTANEOUS | Status: DC

## 2013-11-11 MED ORDER — DIPHENOXYLATE-ATROPINE 2.5-0.025 MG PO TABS: ORAL_TABLET | ORAL | Status: DC

## 2013-11-11 MED ORDER — AMITRIPTYLINE HCL 10 MG PO TABS: ORAL_TABLET | ORAL | Status: DC

## 2013-11-11 NOTE — Progress Notes (Signed)
Patient ID: Joan [name], female   DOB: [birth year], 68 y.o.   MRN: [id number]    Location:    AM  Place of Servce:  OFFICE    Allergies  Allergen Reactions  . Arava [Leflunomide] Diarrhea, Nausea Only and Other (See Comments)    Loss appetite  . Neurontin [Gabapentin] Nausea And Vomiting  . Penicillins Rash    Chief Complaint  Patient presents with  . Medical Managment of Chronic Issues    4 Month follow up and complains of blister on right foot    HPI:  Diarrhea - INTERMITTENT. IMPROVED. Benefits from diphenoxylate-atropine (LOMOTIL) 2.5-0.025 MG per tablet  Hyperthyroidism - : TSH now normal  Hypertension - controlled  Myalgia and myositis, unspecified: improved  Rheumatoid arthritis(714.0) -stable. Seeing Dr. Amil Amen  Callus -salicylic acid not helping    Medications: Patient's Medications  New Prescriptions   No medications on file  Previous Medications   ACETAMINOPHEN (TYLENOL) 500 MG TABLET    Take 500 mg by mouth every 6 (six) hours as needed for pain. Take two tablets up to four times a day as needed   CALCIUM CARB-CHOLECALCIFEROL (CALCIUM 500 +D PO)    Take 500 mg by mouth daily. Take one tablet once a daily for bone health.   DICLOFENAC-MISOPROSTOL 75-0.2 MG TBEC    Take 50 mg by mouth daily. Take 1 tablet twice daily for arthritis.   ENBREL SURECLICK 50 MG/ML INJECTION    Inject 50 mg as directed once a week. Inject once weekly for arthritis   EVISTA 60 MG TABLET    Take one tablet once daily for osteoporosis   FLUTICASONE (FLONASE) 50 MCG/ACT NASAL SPRAY    Place 1 spray into the nose daily.   FOLIC ACID (FOLVITE) 1 MG TABLET    Take 1 mg by mouth daily. Take two tablets every morning.   GLUCOSAMINE-CHONDROIT-VIT C-MN (GLUCOSAMINE CHONDR 500 COMPLEX PO)    Take 500 mg by mouth daily. Take 2 tablets daily for joint health.   HYDROCHLOROTHIAZIDE (MICROZIDE) 12.5 MG CAPSULE    TAKE ONE CAPSULE BY MOUTH EVERY DAY FOR BLOOD PRESSURE   MICONAZOLE (MICOTIN)  2 % CREAM    Apply 1 application topically 2 (two) times daily. Apply to thumbnail daily.   MULTIPLE VITAMIN (MULTIVITAMIN) TABLET    Take 1 tablet by mouth daily. Take 1 tablet once a daily   OMEGA-3 FATTY ACIDS (FISH OIL) 1000 MG CAPS    Take 1,000 mg by mouth daily. Take one tablet once a daily for heart, joint and skin health.   PREDNISONE (DELTASONE) 5 MG TABLET    Take 1 tablet (5 mg total) by mouth daily. Taken for arthritis   SALICYLIC ACID 10 % CREA    Apply topically. Apply to callous daily   TRAMADOL (ULTRAM) 50 MG TABLET    Take 1 tablet (50 mg total) by mouth 4 (four) times daily. To help with pain  Modified Medications   Modified Medication Previous Medication   AMITRIPTYLINE (ELAVIL) 10 MG TABLET amitriptyline (ELAVIL) 10 MG tablet      Take one tablet by mouth at bedtime to help relax muscles    TAKE ONE TABLET BY MOUTH AT BEDTIME TO HELP RELAX MUSCLES.   DIPHENOXYLATE-ATROPINE (LOMOTIL) 2.5-0.025 MG PER TABLET diphenoxylate-atropine (LOMOTIL) 2.5-0.025 MG per tablet      Take one tablet by mouth four times daily as needed for diarrhea or loose stools    TAKE ONE TABLET BY MOUTH 4 TIMES DAILY  AS NEEDED FOR DIARRHEA OR LOOSE STOOLS.  Discontinued Medications   No medications on file     Review of Systems  Constitutional: Negative for fever, chills, weight loss, malaise/fatigue and unexpected weight change.       Regaining. weight.  HENT: Negative for congestion, ear discharge, ear pain, hearing loss, sore throat and tinnitus.   Eyes: Negative for blurred vision, double vision, photophobia and pain.  Respiratory: Negative for cough, hemoptysis, sputum production, shortness of breath and wheezing.   Cardiovascular: Negative for chest pain, palpitations, orthopnea, claudication, leg swelling and PND.  Gastrointestinal: Positive for diarrhea. Negative for heartburn, nausea, vomiting, abdominal pain and constipation.  Endocrine: Negative for polydipsia.  Genitourinary: Negative  for dysuria, urgency and frequency.  Musculoskeletal: Positive for back pain, joint pain and myalgias. Negative for falls and neck pain.  Skin: Negative for itching and rash.       caullus on heels  Allergic/Immunologic: Negative for environmental allergies.  Neurological: Negative for dizziness, tingling, tremors, sensory change, speech change, focal weakness, seizures, loss of consciousness, weakness and headaches.  Hematological: Does not bruise/bleed easily.  Psychiatric/Behavioral: Negative for depression, suicidal ideas, hallucinations and memory loss. The patient is nervous/anxious. The patient does not have insomnia.     Filed Vitals:   11/11/13 1324  BP: 136/72  Pulse: 81  Temp: 98.4 F (36.9 C)  TempSrc: Oral  Height: 4' 10.5" (1.486 m)  Weight: 107 lb 6.4 oz (48.716 kg)   Physical Exam  Constitutional: She is oriented to person, place, and time. She appears well-developed and well-nourished.  HENT:  Head: Normocephalic.  Right Ear: External ear normal.  Left Ear: External ear normal.  Mouth/Throat: No oropharyngeal exudate.  Eyes: Conjunctivae and EOM are normal. Pupils are equal, round, and reactive to light.  Neck: Neck supple. No tracheal deviation present. Thyromegaly present.  Cardiovascular: Normal rate, regular rhythm, normal heart sounds and intact distal pulses.   Pulmonary/Chest: Effort normal and breath sounds normal. No stridor.  Abdominal: Soft. Bowel sounds are normal.  Musculoskeletal: She exhibits no edema and no tenderness.  Diffuse arthralgias. History of rheumatoid arthritis. Currently on Enbrel. Under the care of Dr. Amil Amen.  Lymphadenopathy:    She has no cervical adenopathy.  Neurological: She is oriented to person, place, and time. She has normal reflexes. No cranial nerve deficit. Coordination normal.  Skin: Skin is warm and dry. No rash noted.  Sore on the right foot at bunion  Psychiatric: She has a normal mood and affect. Her behavior is  normal. Judgment and thought content normal.     Labs reviewed: Abstract on 11/07/2013  Component Date Value Ref Range Status  . HM Colonoscopy 12/01/2005 [facility]/Dr Henrene Pastor, nl repeat 10 yrs   Final  Appointment on 11/03/2013  Component Date Value Ref Range Status  . TSH 11/03/2013 2.630  0.450 - 4.500 uIU/mL Final  . Glucose 11/03/2013 82  65 - 99 mg/dL Final  . BUN 11/03/2013 17  8 - 27 mg/dL Final  . Creatinine, Ser 11/03/2013 0.63  0.57 - 1.00 mg/dL Final  . GFR calc non Af Amer 11/03/2013 94  >59 mL/min/1.73 Final  . GFR calc Af Amer 11/03/2013 108  >59 mL/min/1.73 Final  . BUN/Creatinine Ratio 11/03/2013 27* 11 - 26 Final  . Sodium 11/03/2013 142  134 - 144 mmol/L Final  . Potassium 11/03/2013 4.4  3.5 - 5.2 mmol/L Final  . Chloride 11/03/2013 99  97 - 108 mmol/L Final  . CO2 11/03/2013 28  18 - 29 mmol/L Final  . Calcium 11/03/2013 8.5* 8.7 - 10.3 mg/dL Final  . Total Protein 11/03/2013 5.8* 6.0 - 8.5 g/dL Final  . Albumin 11/03/2013 3.5* 3.6 - 4.8 g/dL Final  . Globulin, Total 11/03/2013 2.3  1.5 - 4.5 g/dL Final  . Albumin/Globulin Ratio 11/03/2013 1.5  1.1 - 2.5 Final  . Total Bilirubin 11/03/2013 0.3  0.0 - 1.2 mg/dL Final  . Alkaline Phosphatase 11/03/2013 55  39 - 117 IU/L Final  . AST 11/03/2013 13  0 - 40 IU/L Final  . ALT 11/03/2013 10  0 - 32 IU/L Final      Assessment/Plan  1. Diarrhea intermiitent - diphenoxylate-atropine (LOMOTIL) 2.5-0.025 MG per tablet; Take one tablet by mouth four times daily as needed for diarrhea or loose stools  Dispense: 50 tablet; Refill: 1  2. Hyperthyroidism Normal TSH - TSH; Future  3. Hypertension controlled - CMP; Future  4. Myalgia and myositis, unspecified improved  5. Rheumatoid arthritis(714.0) stable - CMP; Future  6. Callus Stop salicylic acid. Start  urea (CARMOL) 20 % cream; Apply daily to callus  Dispense: 85 g; Refill: 5

## 2013-11-11 NOTE — Patient Instructions (Signed)
Use medication as listed 

## 2013-11-12 ENCOUNTER — Other Ambulatory Visit: Payer: Self-pay | Admitting: Internal Medicine

## 2013-11-16 ENCOUNTER — Other Ambulatory Visit: Payer: Self-pay | Admitting: Nurse Practitioner

## 2013-11-18 ENCOUNTER — Other Ambulatory Visit: Payer: Self-pay

## 2013-11-18 DIAGNOSIS — Z1231 Encounter for screening mammogram for malignant neoplasm of breast: Secondary | ICD-10-CM

## 2013-12-09 ENCOUNTER — Encounter: Payer: Self-pay | Admitting: Internal Medicine

## 2013-12-22 ENCOUNTER — Other Ambulatory Visit: Payer: Self-pay | Admitting: Nurse Practitioner

## 2013-12-24 ENCOUNTER — Telehealth: Payer: Self-pay | Admitting: *Deleted

## 2013-12-24 NOTE — Telephone Encounter (Signed)
Patient called and needed a handicap placard. Filled out form and Dr. Nyoka Cowden signed. Patient notified to pick up.

## 2013-12-29 ENCOUNTER — Encounter (INDEPENDENT_AMBULATORY_CARE_PROVIDER_SITE_OTHER): Payer: Self-pay

## 2013-12-29 ENCOUNTER — Ambulatory Visit
Admission: RE | Admit: 2013-12-29 | Discharge: 2013-12-29 | Disposition: A | Discharge status: Home / Self Care | Payer: Medicare Other | Source: Ambulatory Visit

## 2013-12-29 DIAGNOSIS — Z1231 Encounter for screening mammogram for malignant neoplasm of breast: Secondary | ICD-10-CM

## 2014-02-19 ENCOUNTER — Other Ambulatory Visit: Payer: Self-pay

## 2014-02-19 ENCOUNTER — Ambulatory Visit (INDEPENDENT_AMBULATORY_CARE_PROVIDER_SITE_OTHER): Payer: 59

## 2014-02-19 VITALS — BP 119/72 | HR 78 | Resp 18

## 2014-02-19 DIAGNOSIS — L03119 Cellulitis of unspecified part of limb: Principal | ICD-10-CM

## 2014-02-19 DIAGNOSIS — L02619 Cutaneous abscess of unspecified foot: Secondary | ICD-10-CM

## 2014-02-19 DIAGNOSIS — L84 Corns and callosities: Secondary | ICD-10-CM

## 2014-02-19 DIAGNOSIS — M069 Rheumatoid arthritis, unspecified: Secondary | ICD-10-CM

## 2014-02-19 DIAGNOSIS — Q828 Other specified congenital malformations of skin: Secondary | ICD-10-CM

## 2014-02-19 DIAGNOSIS — M2010 Hallux valgus (acquired), unspecified foot: Secondary | ICD-10-CM

## 2014-02-19 DIAGNOSIS — M2040 Other hammer toe(s) (acquired), unspecified foot: Secondary | ICD-10-CM

## 2014-02-19 MED ORDER — MUPIROCIN CALCIUM 2 % EX CREA: TOPICAL_CREAM | CUTANEOUS | Status: DC

## 2014-02-19 MED ORDER — CLINDAMYCIN HCL 150 MG PO CAPS: 150.0000 mg | ORAL_CAPSULE | Freq: Three times a day (TID) | ORAL | Status: DC

## 2014-02-19 NOTE — Progress Notes (Signed)
Subjective:    Patient ID: Joan [name], female    DOB: October 06, 1945, 68 y.o.   MRN: [id number]  HPI I  have some places on the bottom of both feet and have been going on longer than 6 months and went to Dr. Franne Grip and he trimmed on both of my feet and they are tender some    Review of Systems  Musculoskeletal: Positive for back pain.  Hematological: Bruises/bleeds easily.  All other systems reviewed and are negative.      Objective:   Physical Exam Lower extremity objective findings as follows this is a 68 year old white female well-developed well-nourished oriented x3 presents with a complaint of painful calluses or lesions plantar aspects of both feet. These are associated with her severe rheumatoid deformities right foot more so than left. There is very is a hemorrhage a keratoses subsecond MTP area bilateral as well along the lateral fifth MTP area in the right foot as well these been painful tender and symptomatic patient been doing tried different treatments creams or ointments clean urea creams shoes even told to apply a wart medications to the area which I suggested she avoid.  Okay objective findings as follows vascular status is intact DP postal for PT plus one over 4 bilateral capillary refill time 3 seconds epicritic and proprioceptive sensations intact and symmetric bilateral. Normal plantar response DTRs are listed dermatologically skin color pigment normal hair growth absent nails somewhat criptotic incurvated friable. There is multiple keratoses sub-second bilateral with hemorrhage a keratosis being noted and sub-fifth right secondary to digital contractures and plantigrade metatarsals no x-rays taken this time our based on deformities there is likely dislocation subluxation of the MTP joints with both HAV deformity as well as hammertoe deformities been present patient indicates she is not interested in surgery however this time patient is given several options for  treatment       Assessment & Plan:  Assessment this time is multiple 4 keratoses as well as possible abscess subsecond MTP area right foot secondary to rheumatoid arthropathy and deformities and plantigrade metatarsals. At this time debridement of keratoses was undertaken sub-fifth right subsecond left were debrided without consequence however on debriding the keratotic lesion subsecond right purulent discharge and drainage was expressed from the knee to keratosis at this time a deep culture swab was obtained and submitted for evaluation the area was flushed with sterile saline cleansed and further debrided down to the dermal subdermal junction. At this time Silvadene and gauze dressing were applied. Patient is prescribed antibiotic ointment in the form of clindamycin water 50 mg 3 times a day x10 days and a prescription for Bactroban or muprocin ointment is also given patient is also advised offloading areas much as possible no high heels no flip-flops no barefoot maintain a thick soled cushion shoe at all times. Long-term options were discussed including accommodative shoes with memory foam insoles. Routine a regular debridement of the recurrent keratoses. Toe separators pads and cushions which contemporary alleviate pain. Also the option of surgical intervention was given. Patient would be candidate for a Hoffman-Clayton procedure for reconstruction of the forefoot including a first MTP fusion pan met head resection and digital fusions. It was explained to the patient is a surgery to be done an outpatient basis under IV sedation and regional block. Patient however have to be out of work for at least 6-8 weeks all she was ambulating in a [employer]. Patient will consider her options. Recheck for abscess within 2  weeks contact us immediately dizzy changes or exacerbations  Harriet Masson DPM

## 2014-02-19 NOTE — Patient Instructions (Signed)
ANTIBACTERIAL SOAP INSTRUCTIONS  THE DAY AFTER PROCEDURE  Please follow the instructions your doctor has marked.   Shower as usual. Before getting out, place a drop of antibacterial liquid soap (Dial) on a wet, clean washcloth.  Gently wipe washcloth over affected area.  Afterward, rinse the area with warm water.  Blot the area dry with a soft cloth and cover with antibiotic ointment (neosporin, polysporin, bacitracin) and band aid or gauze and tape  Place 3-4 drops of antibacterial liquid soap in a quart of warm tap water.  Submerge foot into water for 20 minutes.  If bandage was applied after your procedure, leave on to allow for easy lift off, then remove and continue with soak for the remaining time.  Next, blot area dry with a soft cloth and cover with a bandage.  Apply other medications as directed by your doctor, such as cortisporin otic solution (eardrops) or neosporin antibiotic ointment  After washing and drying thoroughly applying the Bactroban or muprocin ointment and a dry sterile compressive dressing

## 2014-02-23 LAB — CULTURE, ROUTINE-ABSCESS
Gram Stain: NONE SEEN
Gram Stain: NONE SEEN

## 2014-02-26 ENCOUNTER — Other Ambulatory Visit: Payer: Self-pay | Admitting: Internal Medicine

## 2014-03-02 ENCOUNTER — Other Ambulatory Visit: Payer: Self-pay | Admitting: Internal Medicine

## 2014-03-03 ENCOUNTER — Telehealth: Payer: Self-pay | Admitting: *Deleted

## 2014-03-03 NOTE — Telephone Encounter (Signed)
Patient is take her normal bath or shower wash the foot regularly and apply the cream or lotion as instructed daily she's been using Silvadene and should continue to do so if the wound is still present.  Looking for my files for previous surgeries for rheumatoid arthritis find the patient are to cool is had pan metatarsal head resections and first MTP fusion procedures done. Contact one of these patients and ask if you would be okay for them to talk to the patient considering this procedure. Seek you can put this already in contact with one of my past patient's, you'll need to get their permission first  Thanks, Harriet Masson DPM

## 2014-03-03 NOTE — Telephone Encounter (Signed)
Been soaking foot in dial soap and water since 02/19/2014.  I won't see him again until 03/16/2014  Do I keep soaking my foot until then or can I just apply the cream?  I also want to talk to someone who has had the surgery done by him, before I make this major decision to have it done or not.

## 2014-03-04 NOTE — Telephone Encounter (Signed)
Called patient and left a message for the patient as far as what Dr Blenda Mounts wanted me to tell her and to have her call if any concerns or questions and that I would get back with her about some surgeries that the doctor has done in the past. lisa

## 2014-03-05 ENCOUNTER — Telehealth: Payer: Self-pay | Admitting: *Deleted

## 2014-03-05 ENCOUNTER — Ambulatory Visit: Payer: 59

## 2014-03-05 NOTE — Telephone Encounter (Signed)
I left a message day before yesterday.  I had asked if I need to still soak in dial sap and use the cream.  Do I need to continue to do this until the 13th when I see Dr. Blenda Mounts?  I'd also like to talk to some one who has had the surgery.  Please give me a call back today because I'm going out of town tomorrow.

## 2014-03-05 NOTE — Telephone Encounter (Signed)
Called patient and I told her that I had left a message on her cell phone which she doesn't check much so I went over what Dr Blenda Mounts stated he wanted her to do and I was still working on the surgeries for her. lisa

## 2014-03-14 ENCOUNTER — Other Ambulatory Visit: Payer: Self-pay | Admitting: Internal Medicine

## 2014-03-16 ENCOUNTER — Other Ambulatory Visit: Payer: 59

## 2014-03-16 ENCOUNTER — Ambulatory Visit (INDEPENDENT_AMBULATORY_CARE_PROVIDER_SITE_OTHER): Payer: 59

## 2014-03-16 VITALS — BP 142/80 | HR 87 | Resp 18

## 2014-03-16 DIAGNOSIS — M069 Rheumatoid arthritis, unspecified: Secondary | ICD-10-CM

## 2014-03-16 DIAGNOSIS — Q828 Other specified congenital malformations of skin: Secondary | ICD-10-CM

## 2014-03-16 DIAGNOSIS — M2010 Hallux valgus (acquired), unspecified foot: Secondary | ICD-10-CM

## 2014-03-16 DIAGNOSIS — I10 Essential (primary) hypertension: Secondary | ICD-10-CM

## 2014-03-16 DIAGNOSIS — M2040 Other hammer toe(s) (acquired), unspecified foot: Secondary | ICD-10-CM

## 2014-03-16 DIAGNOSIS — E0590 Thyrotoxicosis, unspecified without thyrotoxic crisis or storm: Secondary | ICD-10-CM

## 2014-03-16 NOTE — Patient Instructions (Signed)
Rheumatoid Arthritis Rheumatoid arthritis is a disease that causes pain, puffiness (swelling), and stiffness of the joints. It is a long-term (chronic) disease. It can affect the whole body, even the eyes and lungs.  HOME CARE  Stay active, but lessen activity when the disease gets worse.  Eat healthy foods.  Put heat on the affected joints when you wake up and before activity. Keep the heat on for as long as told by your doctor.  Put ice on the affected joints after activity or exercise.  Put ice in a plastic bag.  Place a towel between your skin and the bag.  Leave the ice on for 15-20 minutes, 03-04 times a day.  Take all medicine and other dietary pills (supplements) as told by your doctor.  Use a splint as told by your doctor. Splints help keep joints in a certain position to keep the joint working right.  Do not sleep with pillows under your knees.  Go to programs that can keep you updated on treatments and ways to deal with your disease. GET HELP RIGHT AWAY IF:  You have times where you pass out (faint).  You have times where you are really weak.  You suddenly have a hot, painful joint that feels worse than your normal joint ache.  You have chills.  You have a fever. MAKE SURE YOU:   Understand these instructions.  Will watch your condition.  Will get help right away if you are not doing well or get worse. Document Released: 11/13/2011 Document Reviewed: 11/13/2011 [facility] Patient Information 2015 [place]. This information is not intended to replace advice given to you by your health care provider. Make sure you discuss any questions you have with your health care provider.

## 2014-03-16 NOTE — Progress Notes (Signed)
   Subjective:    Patient ID: Joan [name], female    DOB: 1946-08-15, 68 y.o.   MRN: [id number]  HPI the places on the balls of both my feet are still hurting and still there    Review of Systems no new findings or systemic changes noted     Objective:   Physical Exam Neurovascular status is intact and unchanged pedal pulses are palpable epicritic and proprioceptive sensations unchanged patient these have keratoses subsecond MTP area bilateral with right having pre-ulceration which is resolved are improved for easy screw shoes at this time begin discussed options for surgical intervention from a pan met head resection or Tretha Sciara type procedure was advised to take the first MTP fusion as well as hammertoe repairs 2 through 5 and met head resections 2 through 5. Patient by she would be able were high heels are dry shoes that was of concern to her however ID films on Wednesday benefit or try to make arrangements for her to talk to somebody that had often Truddie Crumble procedure done in the past patient did have some questions about the risk of cases surgery postop followup advised to be nonweightbearing for at least 6 weeks in air fracture boot up to 8 or 10 weeks using using a rollabout or crutches since she her hand arthropathy would allow crutches or rollabout would be a good option for her      Assessment & Plan:  Assessment rheumatoid arthropathy with HAV deformity and hammertoe deformities 2 through 5 as well as subluxation dislocation MTP joints 2 through 5 both feet right worse or than left is associated 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 On patient is recommended conservative care as well cushioned shoes pads insoles were shoes with met pads applied at this time. Reappointed to be  is for surgical consult when ready next progress Harriet Masson DPM

## 2014-03-17 LAB — COMPREHENSIVE METABOLIC PANEL
ALK PHOS: 48 IU/L (ref 39–117)
ALT: 12 IU/L (ref 0–32)
AST: 15 IU/L (ref 0–40)
Albumin/Globulin Ratio: 2 (ref 1.1–2.5)
Albumin: 4.1 g/dL (ref 3.6–4.8)
BILIRUBIN TOTAL: 0.3 mg/dL (ref 0.0–1.2)
BUN / CREAT RATIO: 22 (ref 11–26)
BUN: 16 mg/dL (ref 8–27)
CHLORIDE: 99 mmol/L (ref 97–108)
CO2: 28 mmol/L (ref 18–29)
Calcium: 8.4 mg/dL — ABNORMAL LOW (ref 8.7–10.3)
Creatinine, Ser: 0.73 mg/dL (ref 0.57–1.00)
GFR calc non Af Amer: 86 mL/min/{1.73_m2} (ref >59)
GFR, EST AFRICAN AMERICAN: 99 mL/min/{1.73_m2} (ref >59)
Globulin, Total: 2.1 g/dL (ref 1.5–4.5)
Glucose: 85 mg/dL (ref 65–99)
Potassium: 4 mmol/L (ref 3.5–5.2)
SODIUM: 141 mmol/L (ref 134–144)
Total Protein: 6.2 g/dL (ref 6.0–8.5)

## 2014-03-17 LAB — TSH: TSH: 4.61 u[IU]/mL — AB (ref 0.450–4.500)

## 2014-03-18 ENCOUNTER — Encounter: Payer: Self-pay | Admitting: Internal Medicine

## 2014-03-18 ENCOUNTER — Ambulatory Visit (INDEPENDENT_AMBULATORY_CARE_PROVIDER_SITE_OTHER): Payer: 59 | Admitting: Internal Medicine

## 2014-03-18 VITALS — BP 124/70 | HR 71 | Temp 99.3°F | Ht <= 58 in | Wt 108.0 lb

## 2014-03-18 DIAGNOSIS — M2041 Other hammer toe(s) (acquired), right foot: Secondary | ICD-10-CM | POA: Insufficient documentation

## 2014-03-18 DIAGNOSIS — M21611 Bunion of right foot: Secondary | ICD-10-CM | POA: Insufficient documentation

## 2014-03-18 DIAGNOSIS — M2040 Other hammer toe(s) (acquired), unspecified foot: Secondary | ICD-10-CM

## 2014-03-18 DIAGNOSIS — I10 Essential (primary) hypertension: Secondary | ICD-10-CM

## 2014-03-18 DIAGNOSIS — M21619 Bunion of unspecified foot: Secondary | ICD-10-CM

## 2014-03-18 DIAGNOSIS — R197 Diarrhea, unspecified: Secondary | ICD-10-CM | POA: Insufficient documentation

## 2014-03-18 DIAGNOSIS — E0590 Thyrotoxicosis, unspecified without thyrotoxic crisis or storm: Secondary | ICD-10-CM

## 2014-03-18 DIAGNOSIS — M069 Rheumatoid arthritis, unspecified: Secondary | ICD-10-CM

## 2014-03-18 DIAGNOSIS — M21612 Bunion of left foot: Secondary | ICD-10-CM

## 2014-03-18 MED ORDER — DIPHENOXYLATE-ATROPINE 2.5-0.025 MG PO TABS: ORAL_TABLET | ORAL | Status: DC

## 2014-03-18 MED ORDER — TRAMADOL HCL 50 MG PO TABS: ORAL_TABLET | ORAL | Status: DC

## 2014-03-18 MED ORDER — PREDNISONE 5 MG PO TABS: ORAL_TABLET | ORAL | Status: DC

## 2014-03-18 MED ORDER — HYDROCHLOROTHIAZIDE 12.5 MG PO CAPS: ORAL_CAPSULE | ORAL | Status: DC

## 2014-03-18 NOTE — Progress Notes (Signed)
Patient ID: Joan [name], female   DOB: [birth year], 68 y.o.   MRN: [id number]    Location:    [facility]  Place of Service:  OFFICE    Allergies  Allergen Reactions  . Arava [Leflunomide] Diarrhea, Nausea Only and Other (See Comments)    Loss appetite  . Neurontin [Gabapentin] Nausea And Vomiting  . Penicillins Rash    Chief Complaint  Patient presents with  . Medical Management of Chronic Issues    4 month follow-up, discuss labs (copy printed)     HPI:  Diarrhea - Intermittent. Benefits from diphenoxylate-atropine (LOMOTIL) 2.5-0.025 MG per tabletprn  Hyperthyroidism: drifting in to a hypothyroid state. Has appt with Dr. Elyse Hsu next week.  Rheumatoid arthritis(714.0): stable. Deformities of both hands and probably chin. Has steroid induced ecchymoses.  Essential hypertension: controlled  Bilateral bunions: seeing Dr. Blenda Mounts, who has proposed surgical correction and repair of the 3rd MT that has caused pressure on the sole and a large callus that recently became infected.  Hammer toe of right foot: 2nd toecrosses the right great toe and has a small red area on the PIP joint dorsally.    Medications: Patient's Medications  New Prescriptions   No medications on file  Previous Medications   ACETAMINOPHEN (TYLENOL) 500 MG TABLET    Take 500 mg by mouth every 6 (six) hours as needed for pain. Take two tablets up to four times a day as needed   AMITRIPTYLINE (ELAVIL) 10 MG TABLET    TAKE ONE TABLET BY MOUTH AT BEDTIME TO  HELP  RELAX  MUSCLES.   CALCIUM CARB-CHOLECALCIFEROL (CALCIUM 500 +D PO)    Take 500 mg by mouth daily. Take one tablet once a daily for bone health.   DICLOFENAC-MISOPROSTOL 75-0.2 MG TBEC    Take 50 mg by mouth daily. Take 1 tablet twice daily for arthritis.   DIPHENOXYLATE-ATROPINE (LOMOTIL) 2.5-0.025 MG PER TABLET    Take one tablet by mouth four times daily as needed for diarrhea or loose stools   ENBREL SURECLICK 50 MG/ML INJECTION    Inject 50 mg as  directed once a week. Inject once weekly for arthritis   EVISTA 60 MG TABLET    Take one tablet once daily for osteoporosis   FLUTICASONE (FLONASE) 50 MCG/ACT NASAL SPRAY    Place 1 spray into the nose daily.   FOLIC ACID (FOLVITE) 1 MG TABLET    Take 1 mg by mouth daily. Take two tablets every morning.   GLUCOSAMINE-CHONDROIT-VIT C-MN (GLUCOSAMINE CHONDR 500 COMPLEX PO)    Take 500 mg by mouth daily. Take 2 tablets daily for joint health.   HYDROCHLOROTHIAZIDE (MICROZIDE) 12.5 MG CAPSULE    TAKE ONE CAPSULE BY MOUTH ONCE DAILY FOR BLOOD PRESSURE   MICONAZOLE (MICOTIN) 2 % CREAM    Apply 1 application topically 2 (two) times daily. Apply to thumbnail daily.   MULTIPLE VITAMIN (MULTIVITAMIN) TABLET    Take 1 tablet by mouth daily. Take 1 tablet once a daily   MUPIROCIN CREAM (BACTROBAN) 2 %    Apply once daily to the affected area with dressing change   OMEGA-3 FATTY ACIDS (FISH OIL) 1000 MG CAPS    Take 1,000 mg by mouth daily. Take one tablet once a daily for heart, joint and skin health.   PREDNISONE (DELTASONE) 5 MG TABLET    TAKE ONE TABLET BY MOUTH ONCE DAILY FOR ARTHRITIS   TRAMADOL (ULTRAM) 50 MG TABLET    TAKE ONE TABLET BY MOUTH 4  TIMES DAILY AS NEEDED FOR PAIN   UREA (CARMOL) 20 % CREAM    Apply daily to callus  Modified Medications   No medications on file  Discontinued Medications   CLINDAMYCIN (CLEOCIN) 150 MG CAPSULE    Take 1 capsule (150 mg total) by mouth 3 (three) times daily.     Review of Systems  Constitutional: Negative for fever, chills, weight loss, malaise/fatigue and unexpected weight change.       Regaining. weight.  HENT: Negative for congestion, ear discharge, ear pain, hearing loss, sore throat and tinnitus.   Eyes: Negative for blurred vision, double vision, photophobia and pain.  Respiratory: Negative for cough, hemoptysis, sputum production, shortness of breath and wheezing.   Cardiovascular: Negative for chest pain, palpitations, orthopnea, claudication,  leg swelling and PND.  Gastrointestinal: Positive for diarrhea. Negative for heartburn, nausea, vomiting, abdominal pain and constipation.  Endocrine: Negative for polydipsia.  Genitourinary: Negative for dysuria, urgency and frequency.  Musculoskeletal: Positive for back pain, joint pain and myalgias. Negative for falls and neck pain.  Skin: Negative for itching and rash.       caullus on heels  Allergic/Immunologic: Negative for environmental allergies.  Neurological: Negative for dizziness, tingling, tremors, sensory change, speech change, focal weakness, seizures, loss of consciousness, weakness and headaches.  Hematological: Does not bruise/bleed easily.  Psychiatric/Behavioral: Negative for depression, suicidal ideas, hallucinations and memory loss. The patient is nervous/anxious. The patient does not have insomnia.     Filed Vitals:   03/18/14 1112  BP: 124/70  Pulse: 71  Temp: 99.3 F (37.4 C)  TempSrc: Oral  Height: 4\' 10"  (1.473 m)  Weight: 108 lb (48.988 kg)  SpO2: 98%   Body mass index is 22.58 kg/(m^2).  Physical Exam  Constitutional: She is oriented to person, place, and time. She appears well-developed and well-nourished.  HENT:  Head: Normocephalic.  Right Ear: External ear normal.  Left Ear: External ear normal.  Mouth/Throat: No oropharyngeal exudate.  Eyes: Conjunctivae and EOM are normal. Pupils are equal, round, and reactive to light.  Neck: Neck supple. No tracheal deviation present. Thyromegaly present.  Cardiovascular: Normal rate, regular rhythm, normal heart sounds and intact distal pulses.   Pulmonary/Chest: Effort normal and breath sounds normal. No stridor.  Abdominal: Soft. Bowel sounds are normal.  Musculoskeletal: She exhibits no edema and no tenderness.  Diffuse arthralgias. History of rheumatoid arthritis. Currently on Enbrel. Under the care of Dr. Amil Amen. Bilateral bunions and right hammer toe.  Lymphadenopathy:    She has no cervical  adenopathy.  Neurological: She is oriented to person, place, and time. She has normal reflexes. No cranial nerve deficit. Coordination normal.  Skin: Skin is warm and dry. No rash noted.  Ulceration of the right foot sole at the 3rd toe distal head.   Psychiatric: She has a normal mood and affect. Her behavior is normal. Judgment and thought content normal.     Labs reviewed: Appointment on 03/16/2014  Component Date Value Ref Range Status  . Glucose 03/16/2014 85  65 - 99 mg/dL Final  . BUN 03/16/2014 16  8 - 27 mg/dL Final  . Creatinine, Ser 03/16/2014 0.73  0.57 - 1.00 mg/dL Final  . GFR calc non Af Amer 03/16/2014 86  >59 mL/min/1.73 Final  . GFR calc Af Amer 03/16/2014 99  >59 mL/min/1.73 Final  . BUN/Creatinine Ratio 03/16/2014 22  11 - 26 Final  . Sodium 03/16/2014 141  134 - 144 mmol/L Final  . Potassium 03/16/2014 4.0  3.5 - 5.2 mmol/L Final  . Chloride 03/16/2014 99  97 - 108 mmol/L Final  . CO2 03/16/2014 28  18 - 29 mmol/L Final  . Calcium 03/16/2014 8.4* 8.7 - 10.3 mg/dL Final  . Total Protein 03/16/2014 6.2  6.0 - 8.5 g/dL Final  . Albumin 03/16/2014 4.1  3.6 - 4.8 g/dL Final  . Globulin, Total 03/16/2014 2.1  1.5 - 4.5 g/dL Final  . Albumin/Globulin Ratio 03/16/2014 2.0  1.1 - 2.5 Final  . Total Bilirubin 03/16/2014 0.3  0.0 - 1.2 mg/dL Final  . Alkaline Phosphatase 03/16/2014 48  39 - 117 IU/L Final  . AST 03/16/2014 15  0 - 40 IU/L Final  . ALT 03/16/2014 12  0 - 32 IU/L Final  . TSH 03/16/2014 4.610* 0.450 - 4.500 uIU/mL Final  Orders Only on 02/19/2014  Component Date Value Ref Range Status  . Culture 02/19/2014 Moderate STAPHYLOCOCCUS AUREUS   Final  . Gram Stain 02/19/2014 No WBC Seen   Final  . Gram Stain 02/19/2014 No Squamous Epithelial Cells Seen   Final  . Gram Stain 02/19/2014 Moderate Gram Positive Cocci In Pairs   Final  . Organism ID, Bacteria 02/19/2014 STAPHYLOCOCCUS AUREUS   Final   Comment: Rifampin and Gentamicin should not be used as                           single drugs for treatment of Staph infections.      Assessment/Plan  1. Diarrhea - diphenoxylate-atropine (LOMOTIL) 2.5-0.025 MG per tablet; Take one tablet by mouth four times daily as needed for diarrhea or loose stools  Dispense: 100 tablet; Refill: 3  2. Hyperthyroidism Will probably need supplementation - TSH; Future  3. Rheumatoid arthritis(714.0) stable - traMADol (ULTRAM) 50 MG tablet; TAKE ONE TABLET BY MOUTH 4 TIMES DAILY AS NEEDED FOR PAIN  Dispense: 120 tablet; Refill: 3 - predniSONE (DELTASONE) 5 MG tablet; TAKE ONE TABLET BY MOUTH ONCE DAILY FOR ARTHRITIS  Dispense: 90 tablet; Refill: 3 - CBC With differential/Platelet; Future - Comprehensive metabolic panel; Future  4. Essential hypertension controlled - hydrochlorothiazide (MICROZIDE) 12.5 MG capsule; TAKE ONE CAPSULE BY MOUTH ONCE DAILY FOR BLOOD PRESSURE  Dispense: 90 capsule; Refill: 3 - Comprehensive metabolic panel; Future  5. Bilateral bunions Surgery is justified  6. Hammer toe of right foot Surgery is justified

## 2014-03-18 NOTE — Patient Instructions (Signed)
Surgery to correct bunion and hammer toe is warranted.

## 2014-04-28 ENCOUNTER — Other Ambulatory Visit: Payer: Self-pay | Admitting: Internal Medicine

## 2014-04-28 NOTE — Telephone Encounter (Signed)
RX was called in.

## 2014-05-22 HISTORY — PX: FOOT SURGERY: SHX648

## 2014-05-27 ENCOUNTER — Other Ambulatory Visit: Payer: Self-pay | Admitting: Internal Medicine

## 2014-07-15 ENCOUNTER — Other Ambulatory Visit: Payer: 59

## 2014-07-15 ENCOUNTER — Other Ambulatory Visit: Payer: Self-pay | Admitting: Obstetrics and Gynecology

## 2014-07-15 DIAGNOSIS — E0590 Thyrotoxicosis, unspecified without thyrotoxic crisis or storm: Secondary | ICD-10-CM

## 2014-07-15 DIAGNOSIS — I10 Essential (primary) hypertension: Secondary | ICD-10-CM

## 2014-07-15 DIAGNOSIS — M069 Rheumatoid arthritis, unspecified: Secondary | ICD-10-CM

## 2014-07-16 LAB — CBC WITH DIFFERENTIAL
BASOS ABS: 0 10*3/uL (ref 0.0–0.2)
Basos: 0 %
EOS ABS: 0.2 10*3/uL (ref 0.0–0.4)
Eos: 2 %
HCT: 42.6 % (ref 34.0–46.6)
Hemoglobin: 14 g/dL (ref 11.1–15.9)
IMMATURE GRANULOCYTES: 0 %
Immature Grans (Abs): 0 10*3/uL (ref 0.0–0.1)
LYMPHS ABS: 3.3 10*3/uL — AB (ref 0.7–3.1)
Lymphs: 36 %
MCH: 31.2 pg (ref 26.6–33.0)
MCHC: 32.9 g/dL (ref 31.5–35.7)
MCV: 95 fL (ref 79–97)
Monocytes Absolute: 1.6 10*3/uL — ABNORMAL HIGH (ref 0.1–0.9)
Monocytes: 17 %
Neutrophils Absolute: 4.2 10*3/uL (ref 1.4–7.0)
Neutrophils Relative %: 45 %
Platelets: 307 10*3/uL (ref 150–379)
RBC: 4.49 x10E6/uL (ref 3.77–5.28)
RDW: 13.9 % (ref 12.3–15.4)
WBC: 9.2 10*3/uL (ref 3.4–10.8)

## 2014-07-16 LAB — COMPREHENSIVE METABOLIC PANEL
ALK PHOS: 60 IU/L (ref 39–117)
ALT: 14 IU/L (ref 0–32)
AST: 16 IU/L (ref 0–40)
Albumin/Globulin Ratio: 1.5 (ref 1.1–2.5)
Albumin: 4.4 g/dL (ref 3.6–4.8)
BILIRUBIN TOTAL: 0.3 mg/dL (ref 0.0–1.2)
BUN / CREAT RATIO: 26 (ref 11–26)
BUN: 22 mg/dL (ref 8–27)
CO2: 27 mmol/L (ref 18–29)
Calcium: 9.2 mg/dL (ref 8.7–10.3)
Chloride: 99 mmol/L (ref 97–108)
Creatinine, Ser: 0.85 mg/dL (ref 0.57–1.00)
GFR calc non Af Amer: 72 mL/min/{1.73_m2} (ref >59)
GFR, EST AFRICAN AMERICAN: 83 mL/min/{1.73_m2} (ref >59)
Globulin, Total: 2.9 g/dL (ref 1.5–4.5)
Glucose: 87 mg/dL (ref 65–99)
POTASSIUM: 4.3 mmol/L (ref 3.5–5.2)
Sodium: 142 mmol/L (ref 134–144)
Total Protein: 7.3 g/dL (ref 6.0–8.5)

## 2014-07-16 LAB — TSH: TSH: 3.82 u[IU]/mL (ref 0.450–4.500)

## 2014-07-16 LAB — CYTOLOGY - PAP

## 2014-07-20 ENCOUNTER — Other Ambulatory Visit: Payer: 59

## 2014-07-22 ENCOUNTER — Ambulatory Visit (INDEPENDENT_AMBULATORY_CARE_PROVIDER_SITE_OTHER): Payer: 59 | Admitting: Internal Medicine

## 2014-07-22 ENCOUNTER — Encounter: Payer: Self-pay | Admitting: Internal Medicine

## 2014-07-22 VITALS — BP 118/72 | HR 69 | Temp 98.7°F | Resp 10 | Ht <= 58 in | Wt 112.0 lb

## 2014-07-22 DIAGNOSIS — M069 Rheumatoid arthritis, unspecified: Secondary | ICD-10-CM

## 2014-07-22 DIAGNOSIS — R312 Other microscopic hematuria: Secondary | ICD-10-CM

## 2014-07-22 DIAGNOSIS — R350 Frequency of micturition: Secondary | ICD-10-CM

## 2014-07-22 DIAGNOSIS — M461 Sacroiliitis, not elsewhere classified: Secondary | ICD-10-CM

## 2014-07-22 DIAGNOSIS — R3129 Other microscopic hematuria: Secondary | ICD-10-CM

## 2014-07-22 LAB — POCT URINALYSIS DIPSTICK
BILIRUBIN UA: NEGATIVE
Glucose, UA: NEGATIVE
Ketones, UA: NEGATIVE
LEUKOCYTES UA: NEGATIVE
NITRITE UA: NEGATIVE
PH UA: 8
PROTEIN UA: NEGATIVE
RBC UA: NEGATIVE
Spec Grav, UA: <=1.005
Urobilinogen, UA: 0.2

## 2014-07-22 MED ORDER — AMITRIPTYLINE HCL 10 MG PO TABS: ORAL_TABLET | ORAL | Status: DC

## 2014-07-22 MED ORDER — PREDNISONE 5 MG PO TABS: ORAL_TABLET | ORAL | Status: DC

## 2014-07-22 MED ORDER — FLUTICASONE PROPIONATE 50 MCG/ACT NA SUSP: NASAL | Status: DC

## 2014-07-22 NOTE — Progress Notes (Signed)
Patient ID: Joan [name], female   DOB: 1946/06/19, 68 y.o.   MRN: [id number]    Facility  [facility]    Place of Service:   OFFICE   Allergies  Allergen Reactions  . Arava [Leflunomide] Diarrhea, Nausea Only and Other (See Comments)    Loss appetite  . Neurontin [Gabapentin] Nausea And Vomiting  . Penicillins Rash    Chief Complaint  Patient presents with  . Medical Management of Chronic Issues    4 month follow-up  . Acute Concern    Patient was at GYN last week and was told blood in urine- microscopic, nothing further was said or done. Patient would like recheck on urine today     HPI:  Increasing back pain in lower back for about 3 weeks. Thought she might have bladder infx. No dysuria, but has increased frequency. Using BR every hour in the day.   Rheumatoid arthritis - unchanged  Hematuria, microscopic - last week. Increased frquency, but no dysuria.  Sacroiliitis: mainly right sided  Frequency of urination - uncertain etiology    Medications: Patient's Medications  New Prescriptions   No medications on file  Previous Medications   ACETAMINOPHEN (TYLENOL) 500 MG TABLET    Take 500 mg by mouth every 6 (six) hours as needed for pain. Take two tablets up to four times a day as needed   CALCIUM CARB-CHOLECALCIFEROL (CALCIUM 500 +D PO)    Take 500 mg by mouth daily. Take one tablet once a daily for bone health.   DICLOFENAC-MISOPROSTOL 75-0.2 MG TBEC    Take 1 tablet twice daily for arthritis.   DIPHENOXYLATE-ATROPINE (LOMOTIL) 2.5-0.025 MG PER TABLET    Take one tablet by mouth four times daily as needed for diarrhea or loose stools   ENBREL SURECLICK 50 MG/ML INJECTION    Inject 50 mg as directed once a week. Inject once weekly for arthritis   EVISTA 60 MG TABLET    Take one tablet once daily for osteoporosis   FOLIC ACID (FOLVITE) 1 MG TABLET    Take 1 mg by mouth daily. Take two tablets every morning.   GLUCOSAMINE-CHONDROIT-VIT C-MN (GLUCOSAMINE CHONDR 500 COMPLEX  PO)    Take 500 mg by mouth daily. Take 2 tablets daily for joint health.   HYDROCHLOROTHIAZIDE (MICROZIDE) 12.5 MG CAPSULE    TAKE ONE CAPSULE BY MOUTH ONCE DAILY FOR BLOOD PRESSURE   MULTIPLE VITAMIN (MULTIVITAMIN) TABLET    Take 1 tablet by mouth daily. Take 1 tablet once a daily   MUPIROCIN CREAM (BACTROBAN) 2 %    Apply once daily to the affected area with dressing change   OMEGA-3 FATTY ACIDS (FISH OIL) 1000 MG CAPS    Take 1,000 mg by mouth daily. Take one tablet once a daily for heart, joint and skin health.   TRAMADOL (ULTRAM) 50 MG TABLET    TAKE ONE TABLET BY MOUTH 4 TIMES DAILY AS NEEDED FOR PAIN  Modified Medications   Modified Medication Previous Medication   AMITRIPTYLINE (ELAVIL) 10 MG TABLET amitriptyline (ELAVIL) 10 MG tablet      TAKE ONE TABLET BY MOUTH AT BEDTIME TO HELP  RELAX MUSCLES    TAKE ONE TABLET BY MOUTH AT BEDTIME TO HELP  RELAX MUSCLES   FLUTICASONE (FLONASE) 50 MCG/ACT NASAL SPRAY fluticasone (FLONASE) 50 MCG/ACT nasal spray      Place 1 spray into the nose daily.    Place 1 spray into the nose daily.   PREDNISONE (DELTASONE) 5 MG TABLET  predniSONE (DELTASONE) 5 MG tablet      TAKE ONE TABLET BY MOUTH ONCE DAILY FOR ARTHRITIS    TAKE ONE TABLET BY MOUTH ONCE DAILY FOR ARTHRITIS  Discontinued Medications   MICONAZOLE (MICOTIN) 2 % CREAM    Apply 1 application topically 2 (two) times daily. Apply to thumbnail daily.   UREA (CARMOL) 20 % CREAM    Apply daily to callus     Review of Systems  Constitutional: Negative for fever, chills and unexpected weight change.       Regaining. weight.  HENT: Negative for congestion, ear discharge, ear pain, hearing loss, sore throat and tinnitus.   Eyes: Negative for photophobia and pain.  Respiratory: Negative for cough, shortness of breath and wheezing.   Cardiovascular: Negative for chest pain, palpitations and leg swelling.  Gastrointestinal: Positive for diarrhea. Negative for nausea, vomiting, abdominal pain and  constipation.  Endocrine: Negative for polydipsia.  Genitourinary: Negative for dysuria, urgency and frequency.  Musculoskeletal: Positive for myalgias and back pain. Negative for neck pain.  Skin: Negative for rash.       caullus on heels  Allergic/Immunologic: Negative for environmental allergies.  Neurological: Negative for dizziness, tremors, seizures, weakness and headaches.  Hematological: Does not bruise/bleed easily.  Psychiatric/Behavioral: Negative for suicidal ideas and hallucinations. The patient is nervous/anxious.     Filed Vitals:   07/22/14 1125  BP: 118/72  Pulse: 69  Temp: 98.7 F (37.1 C)  TempSrc: Oral  Resp: 10  Height: 4\' 10"  (1.473 m)  Weight: 112 lb (50.803 kg)  SpO2: 96%   Body mass index is 23.41 kg/(m^2).  Physical Exam  Constitutional: She is oriented to person, place, and time. She appears well-developed and well-nourished.  HENT:  Head: Normocephalic.  Right Ear: External ear normal.  Left Ear: External ear normal.  Mouth/Throat: No oropharyngeal exudate.  Eyes: Conjunctivae and EOM are normal. Pupils are equal, round, and reactive to light.  Neck: Neck supple. No tracheal deviation present. Thyromegaly present.  Cardiovascular: Normal rate, regular rhythm, normal heart sounds and intact distal pulses.   Pulmonary/Chest: Effort normal and breath sounds normal. No stridor.  Abdominal: Soft. Bowel sounds are normal.  Musculoskeletal: She exhibits no edema or tenderness.  Diffuse arthralgias. History of rheumatoid arthritis. Currently on Enbrel. Under the care of Dr. Amil Amen. Bilateral bunions and right hammer toe. Tender right SI joint.  Lymphadenopathy:    She has no cervical adenopathy.  Neurological: She is oriented to person, place, and time. She has normal reflexes. No cranial nerve deficit. Coordination normal.  Skin: Skin is warm and dry. No rash noted.  Ulceration of the right foot sole at the 3rd toe distal head.   Psychiatric: She  has a normal mood and affect. Her behavior is normal. Judgment and thought content normal.     Labs reviewed: Office Visit on 07/22/2014  Component Date Value Ref Range Status  . Color, UA 07/22/2014 Bright Yellow   Final  . Clarity, UA 07/22/2014 Clear   Final  . Glucose, UA 07/22/2014 Neg   Final  . Bilirubin, UA 07/22/2014 Neg   Final  . Ketones, UA 07/22/2014 Neg   Final  . Spec Grav, UA 07/22/2014 <=1.005   Final  . Blood, UA 07/22/2014 Neg   Final  . pH, UA 07/22/2014 8.0   Final  . Protein, UA 07/22/2014 Neg   Final  . Urobilinogen, UA 07/22/2014 0.2   Final  . Nitrite, UA 07/22/2014 Neg   Final  .  Leukocytes, UA 07/22/2014 Negative   Final  Appointment on 07/15/2014  Component Date Value Ref Range Status  . WBC 07/15/2014 9.2  3.4 - 10.8 x10E3/uL Final  . RBC 07/15/2014 4.49  3.77 - 5.28 x10E6/uL Final  . Hemoglobin 07/15/2014 14.0  11.1 - 15.9 g/dL Final  . HCT 07/15/2014 42.6  34.0 - 46.6 % Final  . MCV 07/15/2014 95  79 - 97 fL Final  . MCH 07/15/2014 31.2  26.6 - 33.0 pg Final  . MCHC 07/15/2014 32.9  31.5 - 35.7 g/dL Final  . RDW 07/15/2014 13.9  12.3 - 15.4 % Final  . Platelets 07/15/2014 307  150 - 379 x10E3/uL Final  . Neutrophils Relative % 07/15/2014 45   Final  . Lymphs 07/15/2014 36   Final  . Monocytes 07/15/2014 17   Final  . Eos 07/15/2014 2   Final  . Basos 07/15/2014 0   Final  . Neutrophils Absolute 07/15/2014 4.2  1.4 - 7.0 x10E3/uL Final  . Lymphocytes Absolute 07/15/2014 3.3* 0.7 - 3.1 x10E3/uL Final  . Monocytes Absolute 07/15/2014 1.6* 0.1 - 0.9 x10E3/uL Final  . Eosinophils Absolute 07/15/2014 0.2  0.0 - 0.4 x10E3/uL Final  . Basophils Absolute 07/15/2014 0.0  0.0 - 0.2 x10E3/uL Final  . Immature Granulocytes 07/15/2014 0   Final  . Immature Grans (Abs) 07/15/2014 0.0  0.0 - 0.1 x10E3/uL Final  . Glucose 07/15/2014 87  65 - 99 mg/dL Final  . BUN 07/15/2014 22  8 - 27 mg/dL Final  . Creatinine, Ser 07/15/2014 0.85  0.57 - 1.00 mg/dL Final  .  GFR calc non Af Amer 07/15/2014 72  >59 mL/min/1.73 Final  . GFR calc Af Amer 07/15/2014 83  >59 mL/min/1.73 Final  . BUN/Creatinine Ratio 07/15/2014 26  11 - 26 Final  . Sodium 07/15/2014 142  134 - 144 mmol/L Final  . Potassium 07/15/2014 4.3  3.5 - 5.2 mmol/L Final  . Chloride 07/15/2014 99  97 - 108 mmol/L Final  . CO2 07/15/2014 27  18 - 29 mmol/L Final  . Calcium 07/15/2014 9.2  8.7 - 10.3 mg/dL Final  . Total Protein 07/15/2014 7.3  6.0 - 8.5 g/dL Final  . Albumin 07/15/2014 4.4  3.6 - 4.8 g/dL Final  . Globulin, Total 07/15/2014 2.9  1.5 - 4.5 g/dL Final  . Albumin/Globulin Ratio 07/15/2014 1.5  1.1 - 2.5 Final  . Total Bilirubin 07/15/2014 0.3  0.0 - 1.2 mg/dL Final  . Alkaline Phosphatase 07/15/2014 60  39 - 117 IU/L Final  . AST 07/15/2014 16  0 - 40 IU/L Final  . ALT 07/15/2014 14  0 - 32 IU/L Final  . TSH 07/15/2014 3.820  0.450 - 4.500 uIU/mL Final  Orders Only on 07/15/2014  Component Date Value Ref Range Status  . CYTOLOGY - PAP 07/15/2014 PAP RESULT   Final     Assessment/Plan  1. Rheumatoid arthritis - predniSONE (DELTASONE) 5 MG tablet; TAKE ONE TABLET BY MOUTH ONCE DAILY FOR ARTHRITIS  Dispense: 90 tablet; Refill: 3 - CBC With differential/Platelet; Future - Comprehensive metabolic panel; Future  2. Hematuria, microscopic - POCT urinalysis dipstick - Urine culture  3. Sacroiliitis Use otc Stopain roll on, warm packs, stretching exercises  4. Frequency of urination - Urine culture

## 2014-07-26 LAB — URINE CULTURE

## 2014-10-01 ENCOUNTER — Other Ambulatory Visit: Payer: Self-pay | Admitting: Neurosurgery

## 2014-10-01 DIAGNOSIS — M545 Low back pain: Secondary | ICD-10-CM

## 2014-10-13 ENCOUNTER — Ambulatory Visit
Admission: RE | Admit: 2014-10-13 | Discharge: 2014-10-13 | Disposition: A | Discharge status: Home / Self Care | Payer: PRIVATE HEALTH INSURANCE | Source: Ambulatory Visit | Attending: Neurosurgery | Admitting: Neurosurgery

## 2014-10-13 DIAGNOSIS — M545 Low back pain: Secondary | ICD-10-CM

## 2014-10-13 MED ORDER — IOHEXOL 180 MG/ML  SOLN
1.0000 mL | Freq: Once | INTRAMUSCULAR | Status: AC | PRN
  Administered 2014-10-13: 1 mL via EPIDURAL

## 2014-10-13 MED ORDER — METHYLPREDNISOLONE ACETATE 40 MG/ML INJ SUSP (RADIOLOG
120.0000 mg | Freq: Once | INTRAMUSCULAR | Status: AC
  Administered 2014-10-13: 120 mg via EPIDURAL

## 2014-10-13 NOTE — Discharge Instructions (Signed)

## 2014-11-23 ENCOUNTER — Other Ambulatory Visit: Payer: Self-pay

## 2014-11-23 DIAGNOSIS — Z1231 Encounter for screening mammogram for malignant neoplasm of breast: Secondary | ICD-10-CM

## 2014-11-30 ENCOUNTER — Other Ambulatory Visit: Payer: Medicare Other

## 2014-11-30 DIAGNOSIS — M069 Rheumatoid arthritis, unspecified: Secondary | ICD-10-CM

## 2014-12-01 LAB — COMPREHENSIVE METABOLIC PANEL
A/G RATIO: 1.8 (ref 1.1–2.5)
ALBUMIN: 4.4 g/dL (ref 3.6–4.8)
ALK PHOS: 53 IU/L (ref 39–117)
ALT: 17 IU/L (ref 0–32)
AST: 15 IU/L (ref 0–40)
BUN/Creatinine Ratio: 33 — ABNORMAL HIGH (ref 11–26)
BUN: 23 mg/dL (ref 8–27)
Bilirubin Total: 0.2 mg/dL (ref 0.0–1.2)
CHLORIDE: 101 mmol/L (ref 97–108)
CO2: 26 mmol/L (ref 18–29)
Calcium: 9.2 mg/dL (ref 8.7–10.3)
Creatinine, Ser: 0.69 mg/dL (ref 0.57–1.00)
GFR calc Af Amer: 104 mL/min/{1.73_m2} (ref >59)
GFR calc non Af Amer: 90 mL/min/{1.73_m2} (ref >59)
GLOBULIN, TOTAL: 2.4 g/dL (ref 1.5–4.5)
Glucose: 87 mg/dL (ref 65–99)
Potassium: 4.1 mmol/L (ref 3.5–5.2)
SODIUM: 142 mmol/L (ref 134–144)
Total Protein: 6.8 g/dL (ref 6.0–8.5)

## 2014-12-01 LAB — CBC WITH DIFFERENTIAL
Basophils Absolute: 0 10*3/uL (ref 0.0–0.2)
Basos: 0 %
EOS ABS: 0.1 10*3/uL (ref 0.0–0.4)
EOS: 1 %
HEMATOCRIT: 43.5 % (ref 34.0–46.6)
HEMOGLOBIN: 14.6 g/dL (ref 11.1–15.9)
IMMATURE GRANULOCYTES: 0 %
Immature Grans (Abs): 0 10*3/uL (ref 0.0–0.1)
LYMPHS ABS: 3.1 10*3/uL (ref 0.7–3.1)
LYMPHS: 35 %
MCH: 32.7 pg (ref 26.6–33.0)
MCHC: 33.6 g/dL (ref 31.5–35.7)
MCV: 97 fL (ref 79–97)
Monocytes Absolute: 1.7 10*3/uL — ABNORMAL HIGH (ref 0.1–0.9)
Monocytes: 19 %
Neutrophils Absolute: 3.9 10*3/uL (ref 1.4–7.0)
Neutrophils Relative %: 45 %
RBC: 4.47 x10E6/uL (ref 3.77–5.28)
RDW: 14 % (ref 12.3–15.4)
WBC: 8.8 10*3/uL (ref 3.4–10.8)

## 2014-12-02 ENCOUNTER — Ambulatory Visit: Payer: 59 | Admitting: Internal Medicine

## 2014-12-02 ENCOUNTER — Ambulatory Visit: Payer: Self-pay | Admitting: Internal Medicine

## 2014-12-02 ENCOUNTER — Other Ambulatory Visit: Payer: Self-pay | Admitting: Internal Medicine

## 2014-12-03 ENCOUNTER — Other Ambulatory Visit: Payer: Self-pay | Admitting: Internal Medicine

## 2014-12-03 MED ORDER — TRAMADOL HCL 50 MG PO TABS: ORAL_TABLET | ORAL | Status: DC

## 2014-12-03 NOTE — Addendum Note (Signed)
Addended byMarisa Cyphers C on: 12/03/2014 08:51 AM   Modules accepted: Orders

## 2014-12-09 ENCOUNTER — Ambulatory Visit (INDEPENDENT_AMBULATORY_CARE_PROVIDER_SITE_OTHER): Payer: Medicare Other | Admitting: Internal Medicine

## 2014-12-09 ENCOUNTER — Encounter: Payer: Self-pay | Admitting: Internal Medicine

## 2014-12-09 VITALS — BP 126/78 | HR 71 | Temp 98.6°F | Ht <= 58 in | Wt 115.2 lb

## 2014-12-09 DIAGNOSIS — E0590 Thyrotoxicosis, unspecified without thyrotoxic crisis or storm: Secondary | ICD-10-CM

## 2014-12-09 DIAGNOSIS — M5387 Other specified dorsopathies, lumbosacral region: Secondary | ICD-10-CM | POA: Insufficient documentation

## 2014-12-09 DIAGNOSIS — I10 Essential (primary) hypertension: Secondary | ICD-10-CM

## 2014-12-09 DIAGNOSIS — M069 Rheumatoid arthritis, unspecified: Secondary | ICD-10-CM | POA: Diagnosis not present

## 2014-12-09 DIAGNOSIS — L84 Corns and callosities: Secondary | ICD-10-CM

## 2014-12-09 DIAGNOSIS — M5431 Sciatica, right side: Secondary | ICD-10-CM

## 2014-12-09 MED ORDER — UREA 20 % EX CREA: TOPICAL_CREAM | CUTANEOUS | Status: DC

## 2014-12-09 NOTE — Progress Notes (Signed)
Patient ID: Joan [name], female   DOB: Jun 15, 1946, 69 y.o.   MRN: [id number]    Facility  [facility]    Place of Service:   OFFICE   Allergies  Allergen Reactions  . Arava [Leflunomide] Diarrhea, Nausea Only and Other (See Comments)    Loss appetite  . Neurontin [Gabapentin] Nausea And Vomiting  . Penicillins Rash    Chief Complaint  Patient presents with  . Medical Management of Chronic Issues    4 month Follow up    HPI:   Patient has surgery scheduled in [place], [place] to remove a bone of the right foot. She has bunion. She is artery had 1 foot done.  Essential hypertension: Controlled  Hyperthyroidism: Continues to see Dr. Elyse Hsu. She is not on any supplements at this time. She has been treated with radioiodine therapy. He anticipates that she will transition into a hypothyroid state in the future.  Rheumatoid arthritis: Controlled  Callus - ran out of urea (CARMOL) 20 % cream. Calluses started building up again and is painful to walk on.  Sciatica of right side associated with disorder of lumbosacral spine: Patient has had variable responses to epidural steroid injections. Her last shot was in February 2016 and she thought she had a pretty good response at that time. Dr. Jola Baptist gave her the injection of medication. She has had other injections by Dr. Maryjean Ka, but she did not believe she benefited as much from those injections. She has seen a neurosurgeon, Dr. Earle Gell. His last note indicates that she has not had any additional workup for several years and that if she was not responding well with the shots that she should probably return for further x-rays and evaluation. Because of the improvement with the last shot, she is anticipating that she could have another shot in July 2016. She would like this to be done by Dr. Jola Baptist.   Medications: Patient's Medications  New Prescriptions   No medications on file  Previous Medications   ACETAMINOPHEN (TYLENOL)  500 MG TABLET    Take 500 mg by mouth every 6 (six) hours as needed for pain. Take two tablets up to four times a day as needed   AMITRIPTYLINE (ELAVIL) 10 MG TABLET    TAKE ONE TABLET BY MOUTH AT BEDTIME TO HELP  RELAX MUSCLES   CALCIUM CARB-CHOLECALCIFEROL (CALCIUM 500 +D PO)    Take 500 mg by mouth daily. Take one tablet once a daily for bone health.   DICLOFENAC-MISOPROSTOL 75-0.2 MG TBEC    Take 1 tablet twice daily for arthritis.   DIPHENOXYLATE-ATROPINE (LOMOTIL) 2.5-0.025 MG PER TABLET    Take one tablet by mouth four times daily as needed for diarrhea or loose stools   ENBREL SURECLICK 50 MG/ML INJECTION    Inject 50 mg as directed once a week. Inject once weekly for arthritis   EVISTA 60 MG TABLET    Take one tablet once daily for osteoporosis   FLUTICASONE (FLONASE) 50 MCG/ACT NASAL SPRAY    Place 1 spray into the nose daily.   FOLIC ACID (FOLVITE) 1 MG TABLET    Take 1 mg by mouth daily. Take two tablets every morning.   GLUCOSAMINE-CHONDROIT-VIT C-MN (GLUCOSAMINE CHONDR 500 COMPLEX PO)    Take 500 mg by mouth daily. Take 2 tablets daily for joint health.   HYDROCHLOROTHIAZIDE (MICROZIDE) 12.5 MG CAPSULE    TAKE ONE CAPSULE BY MOUTH ONCE DAILY FOR BLOOD PRESSURE   MULTIPLE VITAMIN (MULTIVITAMIN) TABLET  Take 1 tablet by mouth daily. Take 1 tablet once a daily   MUPIROCIN CREAM (BACTROBAN) 2 %    Apply once daily to the affected area with dressing change   OMEGA-3 FATTY ACIDS (FISH OIL) 1000 MG CAPS    Take 1,000 mg by mouth daily. Take one tablet once a daily for heart, joint and skin health.   PREDNISONE (DELTASONE) 5 MG TABLET    TAKE ONE TABLET BY MOUTH ONCE DAILY FOR ARTHRITIS   TRAMADOL (ULTRAM) 50 MG TABLET    TAKE ONE TABLET BY MOUTH 4 TIMES DAILY AS NEEDED FOR PAIN  Modified Medications   No medications on file  Discontinued Medications   No medications on file     Review of Systems  Constitutional: Negative for fever, chills and unexpected weight change.        Regaining. weight.  HENT: Negative for congestion, ear discharge, ear pain, hearing loss, sore throat and tinnitus.   Eyes: Negative for photophobia and pain.  Respiratory: Negative for cough, shortness of breath and wheezing.   Cardiovascular: Negative for chest pain, palpitations and leg swelling.  Gastrointestinal: Positive for diarrhea. Negative for nausea, vomiting, abdominal pain and constipation.  Endocrine: Negative for polydipsia.  Genitourinary: Negative for dysuria, urgency and frequency.  Musculoskeletal: Positive for myalgias and back pain (Chronic low back discomfort with pain radiating down the right leg). Negative for neck pain.  Skin: Negative for rash.       caullus on heels  Allergic/Immunologic: Negative for environmental allergies.  Neurological: Negative for dizziness, tremors, seizures, weakness and headaches.  Hematological: Does not bruise/bleed easily.  Psychiatric/Behavioral: Negative for suicidal ideas and hallucinations. The patient is nervous/anxious.     Filed Vitals:   12/09/14 1201  BP: 126/78  Pulse: 71  Temp: 98.6 F (37 C)  TempSrc: Oral  Height: 4\' 10"  (1.473 m)  Weight: 115 lb 3.2 oz (52.254 kg)   Body mass index is 24.08 kg/(m^2).  Physical Exam  Constitutional: She is oriented to person, place, and time. She appears well-developed and well-nourished.  HENT:  Head: Normocephalic.  Right Ear: External ear normal.  Left Ear: External ear normal.  Mouth/Throat: No oropharyngeal exudate.  Eyes: Conjunctivae and EOM are normal. Pupils are equal, round, and reactive to light.  Neck: Neck supple. No tracheal deviation present. Thyromegaly present.  Cardiovascular: Normal rate, regular rhythm, normal heart sounds and intact distal pulses.   Pulmonary/Chest: Effort normal and breath sounds normal. No stridor.  Abdominal: Soft. Bowel sounds are normal.  Musculoskeletal: She exhibits no edema or tenderness.  Diffuse arthralgias. History of  rheumatoid arthritis. Currently on Enbrel. Under the care of Dr. Amil Amen. Bilateral bunions and right hammer toe. Tender right SI joint. Chronic back discomfort with pain radiating down the right leg.  Lymphadenopathy:    She has no cervical adenopathy.  Neurological: She is oriented to person, place, and time. She has normal reflexes. No cranial nerve deficit. Coordination normal.  Skin: Skin is warm and dry. No rash noted.  Psychiatric: She has a normal mood and affect. Her behavior is normal. Judgment and thought content normal.     Labs reviewed: Appointment on 11/30/2014  Component Date Value Ref Range Status  . WBC 11/30/2014 8.8  3.4 - 10.8 x10E3/uL Final  . RBC 11/30/2014 4.47  3.77 - 5.28 x10E6/uL Final  . Hemoglobin 11/30/2014 14.6  11.1 - 15.9 g/dL Final  . HCT 11/30/2014 43.5  34.0 - 46.6 % Final  . MCV 11/30/2014 97  79 - 97 fL Final  . MCH 11/30/2014 32.7  26.6 - 33.0 pg Final  . MCHC 11/30/2014 33.6  31.5 - 35.7 g/dL Final  . RDW 11/30/2014 14.0  12.3 - 15.4 % Final  . Neutrophils Relative % 11/30/2014 45   Final  . Lymphs 11/30/2014 35   Final  . Monocytes 11/30/2014 19   Final  . Eos 11/30/2014 1   Final  . Basos 11/30/2014 0   Final  . Neutrophils Absolute 11/30/2014 3.9  1.4 - 7.0 x10E3/uL Final  . Lymphocytes Absolute 11/30/2014 3.1  0.7 - 3.1 x10E3/uL Final  . Monocytes Absolute 11/30/2014 1.7* 0.1 - 0.9 x10E3/uL Final  . Eosinophils Absolute 11/30/2014 0.1  0.0 - 0.4 x10E3/uL Final  . Basophils Absolute 11/30/2014 0.0  0.0 - 0.2 x10E3/uL Final  . Immature Granulocytes 11/30/2014 0   Final  . Immature Grans (Abs) 11/30/2014 0.0  0.0 - 0.1 x10E3/uL Final  . Glucose 11/30/2014 87  65 - 99 mg/dL Final  . BUN 11/30/2014 23  8 - 27 mg/dL Final  . Creatinine, Ser 11/30/2014 0.69  0.57 - 1.00 mg/dL Final  . GFR calc non Af Amer 11/30/2014 90  >59 mL/min/1.73 Final  . GFR calc Af Amer 11/30/2014 104  >59 mL/min/1.73 Final  . BUN/Creatinine Ratio 11/30/2014 33* 11  - 26 Final  . Sodium 11/30/2014 142  134 - 144 mmol/L Final  . Potassium 11/30/2014 4.1  3.5 - 5.2 mmol/L Final  . Chloride 11/30/2014 101  97 - 108 mmol/L Final  . CO2 11/30/2014 26  18 - 29 mmol/L Final  . Calcium 11/30/2014 9.2  8.7 - 10.3 mg/dL Final  . Total Protein 11/30/2014 6.8  6.0 - 8.5 g/dL Final  . Albumin 11/30/2014 4.4  3.6 - 4.8 g/dL Final  . Globulin, Total 11/30/2014 2.4  1.5 - 4.5 g/dL Final  . Albumin/Globulin Ratio 11/30/2014 1.8  1.1 - 2.5 Final  . Bilirubin Total 11/30/2014 0.2  0.0 - 1.2 mg/dL Final  . Alkaline Phosphatase 11/30/2014 53  39 - 117 IU/L Final  . AST 11/30/2014 15  0 - 40 IU/L Final  . ALT 11/30/2014 17  0 - 32 IU/L Final     Assessment/Plan  Essential hypertension: Controlled  Hyperthyroidism: Continue see Dr. Elyse Hsu. Last TSH was in the normal range when checked at his office.  Rheumatoid arthritis: Continue to see rheumatologist. Arthritis seems under reasonable control at this time.  Callus  -Sharply debrided during exam today. No anesthesia. Suture tolerated well. There was an ulceration underneath the callus and some fluid that was clear and without odor. - Plan: urea (CARMOL) 20 % cream -Patient advised to soak foot 15-20 minutes daily and to keep wound covered. She was advised to call us for any problems.  Sciatica of right side associated with disorder of lumbosacral spine: If pain is still present in July, I advised patient that would be willing to give her a referral to Dr.Curnes. Her other alternative is to go back to Dr. Arnoldo Morale for further evaluation.

## 2015-01-11 ENCOUNTER — Ambulatory Visit: Payer: PRIVATE HEALTH INSURANCE

## 2015-01-12 ENCOUNTER — Ambulatory Visit
Admission: RE | Admit: 2015-01-12 | Discharge: 2015-01-12 | Disposition: A | Discharge status: Home / Self Care | Payer: Medicare Other | Source: Ambulatory Visit

## 2015-01-12 DIAGNOSIS — Z1231 Encounter for screening mammogram for malignant neoplasm of breast: Secondary | ICD-10-CM

## 2015-03-05 ENCOUNTER — Other Ambulatory Visit: Payer: Self-pay | Admitting: Nurse Practitioner

## 2015-03-23 ENCOUNTER — Encounter: Payer: Self-pay | Admitting: Nurse Practitioner

## 2015-03-23 ENCOUNTER — Encounter: Payer: Self-pay | Admitting: Internal Medicine

## 2015-03-23 ENCOUNTER — Encounter (INDEPENDENT_AMBULATORY_CARE_PROVIDER_SITE_OTHER): Payer: Medicare Other | Admitting: Nurse Practitioner

## 2015-03-23 ENCOUNTER — Ambulatory Visit (INDEPENDENT_AMBULATORY_CARE_PROVIDER_SITE_OTHER): Payer: Medicare Other | Admitting: Internal Medicine

## 2015-03-23 VITALS — BP 110/76 | HR 70 | Temp 98.1°F | Resp 18 | Ht <= 58 in | Wt 109.6 lb

## 2015-03-23 DIAGNOSIS — R197 Diarrhea, unspecified: Secondary | ICD-10-CM

## 2015-03-23 DIAGNOSIS — I10 Essential (primary) hypertension: Secondary | ICD-10-CM | POA: Diagnosis not present

## 2015-03-23 DIAGNOSIS — M069 Rheumatoid arthritis, unspecified: Secondary | ICD-10-CM

## 2015-03-23 DIAGNOSIS — E039 Hypothyroidism, unspecified: Secondary | ICD-10-CM | POA: Insufficient documentation

## 2015-03-23 DIAGNOSIS — M5431 Sciatica, right side: Secondary | ICD-10-CM

## 2015-03-23 DIAGNOSIS — M5387 Other specified dorsopathies, lumbosacral region: Secondary | ICD-10-CM

## 2015-03-23 MED ORDER — HYDROCHLOROTHIAZIDE 12.5 MG PO CAPS: ORAL_CAPSULE | ORAL | Status: DC

## 2015-03-23 MED ORDER — DICYCLOMINE HCL 20 MG PO TABS: ORAL_TABLET | ORAL | Status: DC

## 2015-03-23 MED ORDER — DICLOFENAC-MISOPROSTOL 75-0.2 MG PO TBEC: DELAYED_RELEASE_TABLET | ORAL | Status: DC

## 2015-03-23 MED ORDER — DIPHENOXYLATE-ATROPINE 2.5-0.025 MG PO TABS: ORAL_TABLET | ORAL | Status: DC

## 2015-03-23 NOTE — Progress Notes (Signed)
Patient ID: Joan [name], female   DOB: 02-15-1946, 69 y.o.   MRN: [id number]    Facility  [facility]    Place of Service:   OFFICE    Allergies  Allergen Reactions  . Arava [Leflunomide] Diarrhea, Nausea Only and Other (See Comments)    Loss appetite  . Neurontin [Gabapentin] Nausea And Vomiting  . Penicillins Rash    Chief Complaint  Patient presents with  . Acute Visit    diarrhea x 1 week    HPI:  Diarrhea: Started levothyroxine 25 mcg 02/11/15. Diarrhea started son afterwards. Stopped levothyroxine 7/816. Continues to have 4-5 stools daily. Liquid stools. Denies pain or cramping. Tried Imodium. Using Lomotil. No fever. Last colonoscopy 2007 by Dr. Henrene Pastor.  Hypothyroidism, unspecified hypothyroidism type: Has transitioned into the hypothyroid state following radioablation therapy of the thyroid due to hyperthyroidism.  Rheumatoid arthritis - benefits from Enbrel and Diclofenac-Misoprostol 75-0.2 MG. Dr. Amil Amen will be leaving [facility] to start his own practice. She has concerns about what will happen to her now.  Essential hypertension -controlled  Sciatica of right side associated with disorder of lumbosacral spine - pains are getting worse again. She said epidural steroid injections in the past and benefited her. She is requesting another epidural steroid. She would like to see Dr. Jola Baptist  Medications: Patient's Medications  New Prescriptions   No medications on file  Previous Medications   ACETAMINOPHEN (TYLENOL) 500 MG TABLET    Take 500 mg by mouth every 6 (six) hours as needed for pain. Take two tablets up to four times a day as needed   AMITRIPTYLINE (ELAVIL) 10 MG TABLET    TAKE ONE TABLET BY MOUTH AT BEDTIME TO HELP  RELAX MUSCLES   CALCIUM CARB-CHOLECALCIFEROL (CALCIUM 500 +D PO)    Take 500 mg by mouth daily. Take one tablet once a daily for bone health.   DICLOFENAC-MISOPROSTOL 75-0.2 MG TBEC    Take 1 tablet twice daily for arthritis.   DIPHENOXYLATE-ATROPINE (LOMOTIL) 2.5-0.025 MG PER TABLET    Take one tablet by mouth four times daily as needed for diarrhea or loose stools   ENBREL SURECLICK 50 MG/ML INJECTION    Inject 50 mg as directed once a week. Inject once weekly for arthritis   EVISTA 60 MG TABLET    Take one tablet once daily for osteoporosis   FLUTICASONE (FLONASE) 50 MCG/ACT NASAL SPRAY    Place 1 spray into the nose daily.   FOLIC ACID (FOLVITE) 1 MG TABLET    Take 1 mg by mouth daily. Take two tablets every morning.   GLUCOSAMINE-CHONDROIT-VIT C-MN (GLUCOSAMINE CHONDR 500 COMPLEX PO)    Take 500 mg by mouth daily. Take 2 tablets daily for joint health.   HYDROCHLOROTHIAZIDE (MICROZIDE) 12.5 MG CAPSULE    TAKE ONE CAPSULE BY MOUTH ONCE DAILY FOR BLOOD PRESSURE   MULTIPLE VITAMIN (MULTIVITAMIN) TABLET    Take 1 tablet by mouth daily. Take 1 tablet once a daily   MUPIROCIN CREAM (BACTROBAN) 2 %    Apply once daily to the affected area with dressing change   OMEGA-3 FATTY ACIDS (FISH OIL) 1000 MG CAPS    Take 1,000 mg by mouth daily. Take one tablet once a daily for heart, joint and skin health.   PREDNISONE (DELTASONE) 5 MG TABLET    TAKE ONE TABLET BY MOUTH ONCE DAILY FOR ARTHRITIS   TRAMADOL (ULTRAM) 50 MG TABLET    TAKE ONE TABLET BY MOUTH 4 TIMES DAILY  AS NEEDED FOR PAIN   UREA (CARMOL) 20 % CREAM    Apply to callus daily  Modified Medications   No medications on file  Discontinued Medications   No medications on file     Review of Systems  Constitutional: Negative for fever, chills and unexpected weight change.       Regaining. weight.  HENT: Negative for congestion, ear discharge, ear pain, hearing loss, sore throat and tinnitus.   Eyes: Negative for photophobia and pain.  Respiratory: Negative for cough, shortness of breath and wheezing.   Cardiovascular: Negative for chest pain, palpitations and leg swelling.  Gastrointestinal: Positive for diarrhea. Negative for nausea, vomiting, abdominal pain and  constipation.  Endocrine: Negative for polydipsia.  Genitourinary: Negative for dysuria, urgency and frequency.  Musculoskeletal: Positive for myalgias and back pain (Chronic low back discomfort with pain radiating down the right leg). Negative for neck pain.  Skin: Negative for rash.       caullus on heels  Allergic/Immunologic: Negative for environmental allergies.  Neurological: Negative for dizziness, tremors, seizures, weakness and headaches.  Hematological: Does not bruise/bleed easily.  Psychiatric/Behavioral: Negative for suicidal ideas and hallucinations. The patient is nervous/anxious.     Filed Vitals:   03/23/15 1616  BP: 110/76  Pulse: 70  Temp: 98.1 F (36.7 C)  TempSrc: Oral  Resp: 18  Height: 4\' 10"  (1.473 m)  Weight: 109 lb 9.6 oz (49.714 kg)  SpO2: 95%   Body mass index is 22.91 kg/(m^2).  Physical Exam  Constitutional: She is oriented to person, place, and time. She appears well-developed and well-nourished.  HENT:  Head: Normocephalic.  Right Ear: External ear normal.  Left Ear: External ear normal.  Mouth/Throat: No oropharyngeal exudate.  Eyes: Conjunctivae and EOM are normal. Pupils are equal, round, and reactive to light.  Neck: Neck supple. No tracheal deviation present. Thyromegaly present.  Cardiovascular: Normal rate, regular rhythm, normal heart sounds and intact distal pulses.   Pulmonary/Chest: Effort normal and breath sounds normal. No stridor.  Abdominal: Soft. Bowel sounds are normal.  Musculoskeletal: She exhibits no edema or tenderness.  Diffuse arthralgias. History of rheumatoid arthritis. Currently on Enbrel. Under the care of Dr. Amil Amen. Bilateral bunions and right hammer toe. Tender right SI joint. Chronic back discomfort with pain radiating down the right leg.  Lymphadenopathy:    She has no cervical adenopathy.  Neurological: She is oriented to person, place, and time. She has normal reflexes. No cranial nerve deficit.  Coordination normal.  Skin: Skin is warm and dry. No rash noted.  Psychiatric: She has a normal mood and affect. Her behavior is normal. Judgment and thought content normal.     Labs reviewed: No visits with results within 3 Month(s) from this visit. Latest known visit with results is:  Appointment on 11/30/2014  Component Date Value Ref Range Status  . WBC 11/30/2014 8.8  3.4 - 10.8 x10E3/uL Final  . RBC 11/30/2014 4.47  3.77 - 5.28 x10E6/uL Final  . Hemoglobin 11/30/2014 14.6  11.1 - 15.9 g/dL Final  . HCT 11/30/2014 43.5  34.0 - 46.6 % Final  . MCV 11/30/2014 97  79 - 97 fL Final  . MCH 11/30/2014 32.7  26.6 - 33.0 pg Final  . MCHC 11/30/2014 33.6  31.5 - 35.7 g/dL Final  . RDW 11/30/2014 14.0  12.3 - 15.4 % Final  . Neutrophils Relative % 11/30/2014 45   Final  . Lymphs 11/30/2014 35   Final  . Monocytes 11/30/2014 19  Final  . Eos 11/30/2014 1   Final  . Basos 11/30/2014 0   Final  . Neutrophils Absolute 11/30/2014 3.9  1.4 - 7.0 x10E3/uL Final  . Lymphocytes Absolute 11/30/2014 3.1  0.7 - 3.1 x10E3/uL Final  . Monocytes Absolute 11/30/2014 1.7* 0.1 - 0.9 x10E3/uL Final  . Eosinophils Absolute 11/30/2014 0.1  0.0 - 0.4 x10E3/uL Final  . Basophils Absolute 11/30/2014 0.0  0.0 - 0.2 x10E3/uL Final  . Immature Granulocytes 11/30/2014 0   Final  . Immature Grans (Abs) 11/30/2014 0.0  0.0 - 0.1 x10E3/uL Final  . Glucose 11/30/2014 87  65 - 99 mg/dL Final  . BUN 11/30/2014 23  8 - 27 mg/dL Final  . Creatinine, Ser 11/30/2014 0.69  0.57 - 1.00 mg/dL Final  . GFR calc non Af Amer 11/30/2014 90  >59 mL/min/1.73 Final  . GFR calc Af Amer 11/30/2014 104  >59 mL/min/1.73 Final  . BUN/Creatinine Ratio 11/30/2014 33* 11 - 26 Final  . Sodium 11/30/2014 142  134 - 144 mmol/L Final  . Potassium 11/30/2014 4.1  3.5 - 5.2 mmol/L Final  . Chloride 11/30/2014 101  97 - 108 mmol/L Final  . CO2 11/30/2014 26  18 - 29 mmol/L Final  . Calcium 11/30/2014 9.2  8.7 - 10.3 mg/dL Final  . Total  Protein 11/30/2014 6.8  6.0 - 8.5 g/dL Final  . Albumin 11/30/2014 4.4  3.6 - 4.8 g/dL Final  . Globulin, Total 11/30/2014 2.4  1.5 - 4.5 g/dL Final  . Albumin/Globulin Ratio 11/30/2014 1.8  1.1 - 2.5 Final  . Bilirubin Total 11/30/2014 0.2  0.0 - 1.2 mg/dL Final  . Alkaline Phosphatase 11/30/2014 53  39 - 117 IU/L Final  . AST 11/30/2014 15  0 - 40 IU/L Final  . ALT 11/30/2014 17  0 - 32 IU/L Final     Assessment/Plan  1. Diarrhea - diphenoxylate-atropine (LOMOTIL) 2.5-0.025 MG per tablet; Take one tablet by mouth four times daily as needed for diarrhea or loose stools  Dispense: 100 tablet; Refill: 3 - dicyclomine (BENTYL) 20 MG tablet; One every 6 hours as needed to control diarrhea.  Dispense: 50 tablet; Refill: 2 - Clostridium difficile EIA; Future - Stool culture; Future  2. Hypothyroidism, unspecified hypothyroidism type She is currently off her levothyroxine supplement of 25 g daily.  3. Rheumatoid arthritis - Diclofenac-Misoprostol 75-0.2 MG TBEC; Take 1 tablet twice daily for arthritis.  Dispense: 60 tablet; Refill: 5  4. Essential hypertension Controlled - hydrochlorothiazide (MICROZIDE) 12.5 MG capsule; TAKE ONE CAPSULE BY MOUTH ONCE DAILY FOR BLOOD PRESSURE  Dispense: 90 capsule; Refill: 3  5. Sciatica of right side associated with disorder of lumbosacral spine - Ambulatory epidural steroid injection; Future

## 2015-03-24 ENCOUNTER — Other Ambulatory Visit: Payer: Medicare Other

## 2015-03-24 DIAGNOSIS — R197 Diarrhea, unspecified: Secondary | ICD-10-CM

## 2015-03-26 LAB — CLOSTRIDIUM DIFFICILE EIA: C difficile Toxins A+B, EIA: NEGATIVE

## 2015-03-29 ENCOUNTER — Telehealth: Payer: Self-pay | Admitting: Internal Medicine

## 2015-03-29 LAB — STOOL CULTURE: E COLI SHIGA TOXIN ASSAY: NEGATIVE

## 2015-03-29 NOTE — Telephone Encounter (Signed)
Pt states she has been having diarrhea and seeing her PCP. States she has taken bentyl, lomotil, and Imodium but they do not help. Pt has also started a probiotic. Offered pt an appt for tomorrow with [facility] but she requested an appt next Monday. Pt scheduled to see Lori Hvozdovic, PA-C 04/05/15@1 :15pm. Pt aware of appt.

## 2015-03-29 NOTE — Telephone Encounter (Signed)
Left message for pt to call back  °

## 2015-03-30 ENCOUNTER — Telehealth: Payer: Self-pay | Admitting: *Deleted

## 2015-03-30 NOTE — Telephone Encounter (Signed)
Patient called and wanted to know what labs was done last. Stool Cx and C Diff was done last and was negative. Patient wanted to make sure the GI provider would be able to see these. Confirmed that they are connected to [facility] and would be able to review the records. Patient worried about why she still has the diarrhea for so long. Confirmed with patient she does have an appointment with the GI provider on 04/05/2015.

## 2015-03-30 NOTE — Progress Notes (Signed)
This encounter was created in error - please disregard.

## 2015-04-05 ENCOUNTER — Ambulatory Visit (INDEPENDENT_AMBULATORY_CARE_PROVIDER_SITE_OTHER): Payer: Medicare Other | Admitting: Physician Assistant

## 2015-04-05 ENCOUNTER — Encounter: Payer: Self-pay | Admitting: Physician Assistant

## 2015-04-05 ENCOUNTER — Other Ambulatory Visit (INDEPENDENT_AMBULATORY_CARE_PROVIDER_SITE_OTHER): Payer: Medicare Other

## 2015-04-05 VITALS — BP 120/70 | HR 80 | Ht <= 58 in | Wt 104.1 lb

## 2015-04-05 DIAGNOSIS — R197 Diarrhea, unspecified: Secondary | ICD-10-CM

## 2015-04-05 LAB — CBC WITH DIFFERENTIAL/PLATELET
Basophils Absolute: 0 10*3/uL (ref 0.0–0.1)
Basophils Relative: 0.4 % (ref 0.0–3.0)
Eosinophils Absolute: 0 10*3/uL (ref 0.0–0.7)
Eosinophils Relative: 0.4 % (ref 0.0–5.0)
HEMATOCRIT: 40.8 % (ref 36.0–46.0)
HEMOGLOBIN: 13.5 g/dL (ref 12.0–15.0)
LYMPHS ABS: 2.2 10*3/uL (ref 0.7–4.0)
LYMPHS PCT: 23.8 % (ref 12.0–46.0)
MCHC: 33.1 g/dL (ref 30.0–36.0)
MCV: 93 fl (ref 78.0–100.0)
MONO ABS: 1 10*3/uL (ref 0.1–1.0)
Monocytes Relative: 10.8 % (ref 3.0–12.0)
Neutro Abs: 6 10*3/uL (ref 1.4–7.7)
Neutrophils Relative %: 64.6 % (ref 43.0–77.0)
PLATELETS: 328 10*3/uL (ref 150.0–400.0)
RBC: 4.39 Mil/uL (ref 3.87–5.11)
RDW: 14.1 % (ref 11.5–15.5)
WBC: 9.2 10*3/uL (ref 4.0–10.5)

## 2015-04-05 LAB — IGA: IgA: 182 mg/dL (ref 68–378)

## 2015-04-05 MED ORDER — MOVIPREP 100 G PO SOLR: 1.0000 | Freq: Once | ORAL | Status: DC

## 2015-04-05 MED ORDER — METRONIDAZOLE 250 MG PO TABS: 250.0000 mg | ORAL_TABLET | Freq: Three times a day (TID) | ORAL | Status: DC

## 2015-04-05 MED ORDER — COLESTIPOL HCL 1 G PO TABS: 1.0000 g | ORAL_TABLET | Freq: Two times a day (BID) | ORAL | Status: DC

## 2015-04-05 NOTE — Progress Notes (Signed)
Patient ID: Joan [name], female   DOB: Mar 06, 1946, 69 y.o.   MRN: [id number]    HPI:  Joan [name] is a 69 y.o.   female referred by Estill Dooms, MD for evaluation of diarrhea. She has had a colonoscopy by Dr. Henrene Pastor in March 2007 which revealed diverticulosis and internal hemorrhoids, but no polyps. She was advised to have surveillance in 10 years.    Violia presents today with a 2-1/2 to three-month history of diarrhea. She states her diarrhea started abruptly. She had started a new thyroid medication and her diarrhea started 2 weeks afterwards. She states she has been on the medication intermittently for years and has never had problems with it. She was seen by her PCP and was prescribed Lomotil and Bentyl. She had a stool culture done which was negative for salmonella, shigella, Campylobacter, or Escherichia coli. C. Difficile toxins a and B were negative. She states the Bentyl alleviates her abdominal cramping but the Lomotil does not improve her diarrhea at all. She has also tried using a probiotic with no relief. She had had a dental crown placed several weeks prior to the onset of her diarrhea but says she was not on any anti-biotics. She is currently having 3-4  Watery bowel movements daily that are moderate in volume. She has no nocturnal stooling. She has no bright red blood per rectum or melena. Her stools are occasionally oily. Prior to the onset of her diarrhea she had not had any Vicente Males biotic therapy nor had she traveled outside of the [place] she has no new pets. No one else in the house has diarrhea. She does have well water at home. She has no associated oral ulcers, eye pain, joint pain, or skin rashes. She denies frequent use of non-steroidal anti-inflammatory's but has been on diclofenac misoprostol for years. She does have days where she is gassy. Her appetite has been diminished and she is afraid to eat because she has diarrhea shortly after every meal. She has lost about 6  pounds in the past month. She is unaware if a family history of colon cancer, colon polyps, or inflammatory bowel disease.    her past medical history  Is significant for hypertension,  Thyrotoxicosis status post treatment now hypothyroid , myalgia and myositis, rheumatoid arthritis , alopecia , anxiety , vertigo , and left bundle branch block.   Past Medical History  Diagnosis Date  . Thyroid disease     hypothyroidism  . Impacted cerumen     right ear  . Thyrotoxicosis   . Unspecified vitamin D deficiency   . Nontoxic uninodular goiter   . Other left bundle branch block   . External hemorrhoids without mention of complication   . Alopecia areata 02/16/2006  . Chronic rhinitis 09/01/05  . Pain in joint, site unspecified 03/01/05  . Myalgia and myositis, unspecified 03/01/05  . Rheumatoid arthritis(714.0) 10/05/2004  . Dysphagia, unspecified(787.20) 2004  . Anxiety state, unspecified 2004  . Benign paroxysmal positional vertigo 04/01/03  . 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 Osteoarthrosis, unspecified whether generalized or localized, unspecified site 04/01/03  . Cervicalgia 04/01/03  . Senile osteoporosis 04/01/03  . Hypertension 2004  . Enthesopathy of ankle and tarsus, unspecified 03/31/96  . Foot and toe(s), blister, without mention of infection   . Corns and callosities   . Dermatophytosis of nail   . Other specified circulatory system disorders   . Encounter for long-term (current) use of other medications   .  Nontoxic uninodular goiter   . Thyrotoxicosis without mention of goiter or other cause, without mention of thyrotoxic crisis or storm   . Routine general medical examination at a health care facility   . Lumbago   . Urinary frequency   . Goiter, unspecified   . Anxiety state, unspecified   . Osteoarthrosis, unspecified whether generalized or localized, unspecified site   . Cervicalgia   . Osteoporosis, unspecified   . Disorder of bone and cartilage, unspecified   .  Other and unspecified nonspecific immunological findings   . Sprain of metatarsophalangeal (joint) of foot   . Unspecified essential hypertension   . Enthesopathy of ankle and tarsus, unspecified   . Fibromyalgia     Past Surgical History  Procedure Laterality Date  . Tubal ligation  1973  . Tonsillectomy and adenoidectomy  1974  . Right wrist sugery for tenosynovitis  1997    DR SYPHER   . Remove ganglion  1998    DR SYPHER   . Foot surgery Right 05/22/2014    Dr.Strom    Family History  Problem Relation Age of Onset  . Heart attack Father     age 7  . Heart disease Father    History  Substance Use Topics  . Smoking status: Never Smoker   . Smokeless tobacco: Never Used  . Alcohol Use: No   Current Outpatient Prescriptions  Medication Sig Dispense Refill  . acetaminophen (TYLENOL) 500 MG tablet Take 500 mg by mouth every 6 (six) hours as needed for pain. Take two tablets up to four times a day as needed    . amitriptyline (ELAVIL) 10 MG tablet TAKE ONE TABLET BY MOUTH AT BEDTIME TO HELP  RELAX MUSCLES 90 tablet 3  . Calcium Carb-Cholecalciferol (CALCIUM 500 +D PO) Take 500 mg by mouth daily. Take one tablet once a daily for bone health.    . Diclofenac-Misoprostol 75-0.2 MG TBEC Take 1 tablet twice daily for arthritis. 60 tablet 5  . dicyclomine (BENTYL) 20 MG tablet One every 6 hours as needed to control diarrhea. 50 tablet 2  . diphenoxylate-atropine (LOMOTIL) 2.5-0.025 MG per tablet Take one tablet by mouth four times daily as needed for diarrhea or loose stools 100 tablet 3  . ENBREL SURECLICK 50 MG/ML injection Inject 50 mg as directed once a week. Inject once weekly for arthritis    . EVISTA 60 MG tablet Take one tablet once daily for osteoporosis    . fluticasone (FLONASE) 50 MCG/ACT nasal spray Place 1 spray into the nose daily. 16 g 11  . folic acid (FOLVITE) 1 MG tablet Take 1 mg by mouth daily. Take two tablets every morning.    . Glucosamine-Chondroit-Vit C-Mn  (GLUCOSAMINE CHONDR 500 COMPLEX PO) Take 500 mg by mouth daily. Take 2 tablets daily for joint health.    . hydrochlorothiazide (MICROZIDE) 12.5 MG capsule TAKE ONE CAPSULE BY MOUTH ONCE DAILY FOR BLOOD PRESSURE 90 capsule 3  . Multiple Vitamin (MULTIVITAMIN) tablet Take 1 tablet by mouth daily. Take 1 tablet once a daily    . mupirocin cream (BACTROBAN) 2 % Apply once daily to the affected area with dressing change 30 g 1  . Omega-3 Fatty Acids (FISH OIL) 1000 MG CAPS Take 1,000 mg by mouth daily. Take one tablet once a daily for heart, joint and skin health.    . traMADol (ULTRAM) 50 MG tablet TAKE ONE TABLET BY MOUTH 4 TIMES DAILY AS NEEDED FOR PAIN 120 tablet 0  .  urea (CARMOL) 20 % cream Apply to callus daily 85 g 5  . colestipol (COLESTID) 1 G tablet Take 1 tablet (1 g total) by mouth 2 (two) times daily. 60 tablet 2  . metroNIDAZOLE (FLAGYL) 250 MG tablet Take 1 tablet (250 mg total) by mouth 3 (three) times daily. 30 tablet 0  . MOVIPREP 100 G SOLR Take 1 kit (200 g total) by mouth once. 1 kit 0   No current facility-administered medications for this visit.   Allergies  Allergen Reactions  . Arava [Leflunomide] Diarrhea, Nausea Only and Other (See Comments)    Loss appetite  . Neurontin [Gabapentin] Nausea And Vomiting  . Penicillins Rash     Review of Systems:  per history of present illness otherwise negative.   LAB RESULTS:  stool for C. Difficile on 03/24/2015 was negative. Stool culture on 03/24/2015 was negative. Prior Endoscopies:    Colonoscopy 12/01/2005 revealed diverticulosis and internal hemorrhoids, no polyps.  Physical Exam: BP 120/70 mmHg  Pulse 80  Ht 4' 8.75" (1.441 m)  Wt 104 lb 2 oz (47.231 kg)  BMI 22.75 kg/m2 Constitutional: Pleasant,well-developed, anxious, Caucasianfemale in no acute distress. HEENT: Normocephalic and atraumatic. Conjunctivae are normal. No scleral icterus. Neck supple.  No cervical adenopathy Cardiovascular: Normal rate, regular  rhythm.  Pulmonary/chest: Effort normal and breath sounds normal. No wheezing, rales or rhonchi. Abdominal: Soft, nondistended, nontender. Bowel sounds active throughout. There are no masses palpable. No hepatomegaly. Extremities: no edema Lymphadenopathy: No cervical adenopathy noted. Neurological: Alert and oriented to person place and time. Skin: Skin is warm and dry. No rashes noted. Psychiatric: Normal mood and affect. Behavior is normal.  ASSESSMENT AND PLAN:  69 year old female with a 2-1/2 to three-month history of diarrhea referred for evaluation. A CBC with differential, IgA, TTG,  and TSH will be obtained along with a repeat stool for C. Difficile PCR and a stool for Giardia as patient has well water. She will be scheduled for a colonoscopy to evaluate for polyps, neoplasia, IBD, or possible microscopic colitis.The risks, benefits, and alternatives to colonoscopy with possible biopsy and possible polypectomy were discussed with the patient and they consent to proceed.  In the meantime. She will avoid dairy and we'll try to adhere to a high-fiber low-fat diet. After she  Her stool test she will be given an empiric trial of Flagyl 250 mg 3 times a day for 10 days for possible small intestinal bacterial overgrowth. She will also try florastor  250 mg 1 by mouth twice daily for 30 days. Patient reports occasional oily stools and we'll also try Colestid 1 g twice a day. Further recommendations will be made pending the findings of the above.    Kimyah Frein, Deloris Ping 04/05/2015, 3:19 PM  CC: Estill Dooms, MD

## 2015-04-05 NOTE — Progress Notes (Signed)
Agree with initial assessment and plans 

## 2015-04-05 NOTE — Patient Instructions (Addendum)
You have been scheduled for a colonoscopy. Please follow written instructions given to you at your visit today.  Please pick up your prep supplies at the pharmacy within the next 1-3 days. If you use inhalers (even only as needed), please bring them with you on the day of your procedure. Your physician has requested that you go to www.startemmi.com and enter the access code given to you at your visit today. This web site gives a general overview about your procedure. However, you should still follow specific instructions given to you by our office regarding your preparation for the procedure.  We have sent the following medications to your pharmacy for you to pick up at your convenience: Colestid 1 gram twice daily  AFTER you drop off your stool tests at the lab, please start Flagyl 250 mg three times daily x 10 days.  Please purchase the following medications over the counter and take as directed: Florastor 250 ,g twice daily x 30 days  Your physician has requested that you go to the basement for the following lab work before leaving today: Stool for giardia, CBC, IgA, TtG, C diff by PCR, TSH  Please refrain from dairy.  Follow a high fiber, low fat diet.  High-Fiber Diet Fiber is found in fruits, vegetables, and grains. A high-fiber diet encourages the addition of more whole grains, legumes, fruits, and vegetables in your diet. The recommended amount of fiber for adult males is 38 g per day. For adult females, it is 25 g per day. Pregnant and lactating women should get 28 g of fiber per day. If you have a digestive or bowel problem, ask your caregiver for advice before adding high-fiber foods to your diet. Eat a variety of high-fiber foods instead of only a select few type of foods.  PURPOSE  To increase stool bulk.  To make bowel movements more regular to prevent constipation.  To lower cholesterol.  To prevent overeating. WHEN IS THIS DIET USED?  It may be used if you have  constipation and hemorrhoids.  It may be used if you have uncomplicated diverticulosis (intestine condition) and irritable bowel syndrome.  It may be used if you need help with weight management.  It may be used if you want to add it to your diet as a protective measure against atherosclerosis, diabetes, and cancer. SOURCES OF FIBER  Whole-grain breads and cereals.  Fruits, such as apples, oranges, bananas, berries, prunes, and pears.  Vegetables, such as green peas, carrots, sweet potatoes, beets, broccoli, cabbage, spinach, and artichokes.  Legumes, such split peas, soy, lentils.  Almonds. FIBER CONTENT IN FOODS Starches and Grains / Dietary Fiber (g)  Cheerios, 1 cup / 3 g  Corn Flakes cereal, 1 cup / 0.7 g  Rice crispy treat cereal, 1 cup / 0.3 g  Instant oatmeal (cooked),  cup / 2 g  Frosted wheat cereal, 1 cup / 5.1 g  Brown, long-grain rice (cooked), 1 cup / 3.5 g  White, long-grain rice (cooked), 1 cup / 0.6 g  Enriched macaroni (cooked), 1 cup / 2.5 g Legumes / Dietary Fiber (g)  Baked beans (canned, plain, or vegetarian),  cup / 5.2 g  Kidney beans (canned),  cup / 6.8 g  Pinto beans (cooked),  cup / 5.5 g Breads and Crackers / Dietary Fiber (g)  Plain or honey graham crackers, 2 squares / 0.7 g  Saltine crackers, 3 squares / 0.3 g  Plain, salted pretzels, 10 pieces / 1.8 g  Whole-wheat  bread, 1 slice / 1.9 g  White bread, 1 slice / 0.7 g  Raisin bread, 1 slice / 1.2 g  Plain bagel, 3 oz / 2 g  Flour tortilla, 1 oz / 0.9 g  Corn tortilla, 1 small / 1.5 g  Hamburger or hotdog bun, 1 small / 0.9 g Fruits / Dietary Fiber (g)  Apple with skin, 1 medium / 4.4 g  Sweetened applesauce,  cup / 1.5 g  Banana,  medium / 1.5 g  Grapes, 10 grapes / 0.4 g  Orange, 1 small / 2.3 g  Raisin, 1.5 oz / 1.6 g  Melon, 1 cup / 1.4 g Vegetables / Dietary Fiber (g)  Green beans (canned),  cup / 1.3 g  Carrots (cooked),  cup / 2.3  g  Broccoli (cooked),  cup / 2.8 g  Peas (cooked),  cup / 4.4 g  Mashed potatoes,  cup / 1.6 g  Lettuce, 1 cup / 0.5 g  Corn (canned),  cup / 1.6 g  Tomato,  cup / 1.1 g Document Released: 08/21/2005 Document Revised: 02/20/2012 Document Reviewed: 11/23/2011 [employer] Patient Information 2015 [place], [place]. This information is not intended to replace advice given to you by your health care provider. Make sure you discuss any questions you have with your health care provider.   Fat and Cholesterol Control Diet Fat and cholesterol levels in your blood and organs are influenced by your diet. High levels of fat and cholesterol may lead to diseases of the heart, small and large blood vessels, gallbladder, liver, and pancreas. CONTROLLING FAT AND CHOLESTEROL WITH DIET Although exercise and lifestyle factors are important, your diet is key. That is because certain foods are known to raise cholesterol and others to lower it. The goal is to balance foods for their effect on cholesterol and more importantly, to replace saturated and trans fat with other types of fat, such as monounsaturated fat, polyunsaturated fat, and omega-3 fatty acids. On average, a person should consume no more than 15 to 17 g of saturated fat daily. Saturated and trans fats are considered "bad" fats, and they will raise LDL cholesterol. Saturated fats are primarily found in animal products such as meats, butter, and cream. However, that does not mean you need to give up all your favorite foods. Today, there are good tasting, low-fat, low-cholesterol substitutes for most of the things you like to eat. Choose low-fat or nonfat alternatives. Choose round or loin cuts of red meat. These types of cuts are lowest in fat and cholesterol. Chicken (without the skin), fish, veal, and ground [place] breast are great choices. Eliminate fatty meats, such as hot dogs and salami. Even shellfish have little or no saturated fat. Have a 3 oz  (85 g) portion when you eat lean meat, poultry, or fish. Trans fats are also called "partially hydrogenated oils." They are oils that have been scientifically manipulated so that they are solid at room temperature resulting in a longer shelf life and improved taste and texture of foods in which they are added. Trans fats are found in stick margarine, some tub margarines, cookies, crackers, and baked goods.  When baking and cooking, oils are a great substitute for butter. The monounsaturated oils are especially beneficial since it is believed they lower LDL and raise HDL. The oils you should avoid entirely are saturated tropical oils, such as coconut and palm.  Remember to eat a lot from food groups that are naturally free of saturated and trans fat, including  fish, fruit, vegetables, beans, grains (barley, rice, couscous, bulgur wheat), and pasta (without cream sauces).  IDENTIFYING FOODS THAT LOWER FAT AND CHOLESTEROL  Soluble fiber may lower your cholesterol. This type of fiber is found in fruits such as apples, vegetables such as broccoli, potatoes, and carrots, legumes such as beans, peas, and lentils, and grains such as barley. Foods fortified with plant sterols (phytosterol) may also lower cholesterol. You should eat at least 2 g per day of these foods for a cholesterol lowering effect.  Read package labels to identify low-saturated fats, trans fat free, and low-fat foods at the supermarket. Select cheeses that have only 2 to 3 g saturated fat per ounce. Use a heart-healthy tub margarine that is free of trans fats or partially hydrogenated oil. When buying baked goods (cookies, crackers), avoid partially hydrogenated oils. Breads and muffins should be made from whole grains (whole-wheat or whole oat flour, instead of "flour" or "enriched flour"). Buy non-creamy canned soups with reduced salt and no added fats.  FOOD PREPARATION TECHNIQUES  Never deep-fry. If you must fry, either stir-fry, which uses  very little fat, or use non-stick cooking sprays. When possible, broil, bake, or roast meats, and steam vegetables. Instead of putting butter or margarine on vegetables, use lemon and herbs, applesauce, and cinnamon (for squash and sweet potatoes). Use nonfat yogurt, salsa, and low-fat dressings for salads.  LOW-SATURATED FAT / LOW-FAT FOOD SUBSTITUTES Meats / Saturated Fat (g)  Avoid: Steak, marbled (3 oz/85 g) / 11 g  Choose: Steak, lean (3 oz/85 g) / 4 g  Avoid: Hamburger (3 oz/85 g) / 7 g  Choose: Hamburger, lean (3 oz/85 g) / 5 g  Avoid: Ham (3 oz/85 g) / 6 g  Choose: Ham, lean cut (3 oz/85 g) / 2.4 g  Avoid: Chicken, with skin, dark meat (3 oz/85 g) / 4 g  Choose: Chicken, skin removed, dark meat (3 oz/85 g) / 2 g  Avoid: Chicken, with skin, light meat (3 oz/85 g) / 2.5 g  Choose: Chicken, skin removed, light meat (3 oz/85 g) / 1 g Dairy / Saturated Fat (g)  Avoid: Whole milk (1 cup) / 5 g  Choose: Low-fat milk, 2% (1 cup) / 3 g  Choose: Low-fat milk, 1% (1 cup) / 1.5 g  Choose: Skim milk (1 cup) / 0.3 g  Avoid: Hard cheese (1 oz/28 g) / 6 g  Choose: Skim milk cheese (1 oz/28 g) / 2 to 3 g  Avoid: Cottage cheese, 4% fat (1 cup) / 6.5 g  Choose: Low-fat cottage cheese, 1% fat (1 cup) / 1.5 g  Avoid: Ice cream (1 cup) / 9 g  Choose: Sherbet (1 cup) / 2.5 g  Choose: Nonfat frozen yogurt (1 cup) / 0.3 g  Choose: Frozen fruit bar / trace  Avoid: Whipped cream (1 tbs) / 3.5 g  Choose: Nondairy whipped topping (1 tbs) / 1 g Condiments / Saturated Fat (g)  Avoid: Mayonnaise (1 tbs) / 2 g  Choose: Low-fat mayonnaise (1 tbs) / 1 g  Avoid: Butter (1 tbs) / 7 g  Choose: Extra light margarine (1 tbs) / 1 g  Avoid: Coconut oil (1 tbs) / 11.8 g  Choose: Olive oil (1 tbs) / 1.8 g  Choose: Corn oil (1 tbs) / 1.7 g  Choose: Safflower oil (1 tbs) / 1.2 g  Choose: Sunflower oil (1 tbs) / 1.4 g  Choose: Soybean oil (1 tbs) / 2.4 g  Choose: Canola oil (1  tbs)  / 1 g Document Released: 08/21/2005 Document Revised: 12/16/2012 Document Reviewed: 11/19/2013 [facility] Patient Information 2015 [place], [place]. This information is not intended to replace advice given to you by your health care provider. Make sure you discuss any questions you have with your health care provider.

## 2015-04-06 ENCOUNTER — Other Ambulatory Visit: Payer: Medicare Other

## 2015-04-06 ENCOUNTER — Telehealth: Payer: Self-pay | Admitting: *Deleted

## 2015-04-06 DIAGNOSIS — R197 Diarrhea, unspecified: Secondary | ICD-10-CM

## 2015-04-06 LAB — TSH: TSH: 1.6 u[IU]/mL (ref 0.35–4.50)

## 2015-04-06 LAB — TISSUE TRANSGLUTAMINASE, IGA: TISSUE TRANSGLUTAMINASE AB, IGA: 1 U/mL (ref <4)

## 2015-04-06 NOTE — Telephone Encounter (Signed)
Patient notified of recommendations. OV scheduled.

## 2015-04-06 NOTE — Telephone Encounter (Signed)
Left a message for patient to call back. 

## 2015-04-06 NOTE — Telephone Encounter (Signed)
-----   Message from Vita Barley Hvozdovic, PA-C sent at 04/06/2015  9:51 AM EDT ----- Rollene Fare, this pt was in yesterday. Was given flagyl and colestid. We told her we would see if there was a spot for colonoscopy soon. Per Dr Henrene Pastor, have her try the flagyl first. If diarrhea doesn't stop with flagyl, have her start colestid that was prescribed. Have her come for f/u in 4-5 weeks. Thanks. ----- Message -----    From: Irene Shipper, MD    Sent: 04/05/2015   4:35 PM      To: Vita Barley Hvozdovic, PA-C  I saw your note. Treat her with empiric flagyl as you planned. If no better, Colestid as you planned. You could see her for follow up in 4-5 weeks, if no better, we'll find a spot for colonoscopy ----- Message -----    From: Vita Barley Hvozdovic, PA-C    Sent: 04/05/2015   2:16 PM      To: Irene Shipper, MD  Dr Henrene Pastor, Pt was in with 2 1/2 mos diarrhea.  Stool studies neg thus far. Pt due for colonoscopy--you are booking October. Is there any spot you could sneak a colonoscopy in sooner ?  Cecille Rubin

## 2015-04-07 ENCOUNTER — Other Ambulatory Visit: Payer: Medicare Other

## 2015-04-07 ENCOUNTER — Other Ambulatory Visit: Payer: Self-pay | Admitting: Physician Assistant

## 2015-04-07 DIAGNOSIS — R197 Diarrhea, unspecified: Secondary | ICD-10-CM

## 2015-04-08 ENCOUNTER — Encounter: Payer: Self-pay | Admitting: Physician Assistant

## 2015-04-08 LAB — CLOSTRIDIUM DIFFICILE BY PCR: Toxigenic C. Difficile by PCR: NOT DETECTED

## 2015-04-08 LAB — GIARDIA/CRYPTOSPORIDIUM (EIA)
Cryptosporidium Screen (EIA): NEGATIVE
Giardia Screen (EIA): NEGATIVE

## 2015-04-08 NOTE — Telephone Encounter (Signed)
ERROR

## 2015-04-09 ENCOUNTER — Telehealth: Payer: Self-pay | Admitting: Physician Assistant

## 2015-04-09 NOTE — Telephone Encounter (Signed)
Patient notified of recommendations. 

## 2015-04-09 NOTE — Telephone Encounter (Signed)
Patient is using Flagyl, Bentyl and Colestid. She states she is still having diarrhea. States she has had 3 diarrhea stools today. Occur after she eats. She is worried because the diarrhea is still occurring.

## 2015-04-09 NOTE — Telephone Encounter (Signed)
Stop colestid til done flagyl, then restart. Should also use florastor 250 mg 2 capsules twice daily for 4 weeks. Clear liquid diet x 24 hours, then a BRAT diet for 2 days, then slowly advance diet.

## 2015-04-14 ENCOUNTER — Encounter (HOSPITAL_COMMUNITY): Payer: Self-pay | Admitting: Emergency Medicine

## 2015-04-14 DIAGNOSIS — Z7952 Long term (current) use of systemic steroids: Secondary | ICD-10-CM

## 2015-04-14 DIAGNOSIS — I447 Left bundle-branch block, unspecified: Secondary | ICD-10-CM | POA: Diagnosis present

## 2015-04-14 DIAGNOSIS — E890 Postprocedural hypothyroidism: Secondary | ICD-10-CM | POA: Diagnosis present

## 2015-04-14 DIAGNOSIS — N6029 Fibroadenosis of unspecified breast: Secondary | ICD-10-CM | POA: Diagnosis present

## 2015-04-14 DIAGNOSIS — Z79899 Other long term (current) drug therapy: Secondary | ICD-10-CM

## 2015-04-14 DIAGNOSIS — M810 Age-related osteoporosis without current pathological fracture: Secondary | ICD-10-CM | POA: Diagnosis present

## 2015-04-14 DIAGNOSIS — I10 Essential (primary) hypertension: Secondary | ICD-10-CM | POA: Diagnosis present

## 2015-04-14 DIAGNOSIS — M1990 Unspecified osteoarthritis, unspecified site: Secondary | ICD-10-CM | POA: Diagnosis present

## 2015-04-14 DIAGNOSIS — N8320 Unspecified ovarian cysts: Secondary | ICD-10-CM | POA: Diagnosis present

## 2015-04-14 DIAGNOSIS — R197 Diarrhea, unspecified: Secondary | ICD-10-CM | POA: Diagnosis not present

## 2015-04-14 DIAGNOSIS — M47816 Spondylosis without myelopathy or radiculopathy, lumbar region: Secondary | ICD-10-CM | POA: Diagnosis present

## 2015-04-14 DIAGNOSIS — J310 Chronic rhinitis: Secondary | ICD-10-CM | POA: Diagnosis present

## 2015-04-14 DIAGNOSIS — M542 Cervicalgia: Secondary | ICD-10-CM | POA: Diagnosis present

## 2015-04-14 DIAGNOSIS — H8110 Benign paroxysmal vertigo, unspecified ear: Secondary | ICD-10-CM | POA: Diagnosis present

## 2015-04-14 DIAGNOSIS — M797 Fibromyalgia: Secondary | ICD-10-CM | POA: Diagnosis present

## 2015-04-14 DIAGNOSIS — K529 Noninfective gastroenteritis and colitis, unspecified: Principal | ICD-10-CM | POA: Diagnosis present

## 2015-04-14 DIAGNOSIS — K5730 Diverticulosis of large intestine without perforation or abscess without bleeding: Secondary | ICD-10-CM | POA: Diagnosis present

## 2015-04-14 DIAGNOSIS — D509 Iron deficiency anemia, unspecified: Secondary | ICD-10-CM | POA: Diagnosis present

## 2015-04-14 DIAGNOSIS — E559 Vitamin D deficiency, unspecified: Secondary | ICD-10-CM | POA: Diagnosis present

## 2015-04-14 DIAGNOSIS — F411 Generalized anxiety disorder: Secondary | ICD-10-CM | POA: Diagnosis present

## 2015-04-14 DIAGNOSIS — M069 Rheumatoid arthritis, unspecified: Secondary | ICD-10-CM | POA: Diagnosis present

## 2015-04-14 DIAGNOSIS — Z880 Allergy status to penicillin: Secondary | ICD-10-CM

## 2015-04-14 DIAGNOSIS — E860 Dehydration: Secondary | ICD-10-CM | POA: Diagnosis present

## 2015-04-14 DIAGNOSIS — Z888 Allergy status to other drugs, medicaments and biological substances status: Secondary | ICD-10-CM

## 2015-04-14 DIAGNOSIS — E871 Hypo-osmolality and hyponatremia: Secondary | ICD-10-CM | POA: Diagnosis present

## 2015-04-14 DIAGNOSIS — E876 Hypokalemia: Secondary | ICD-10-CM | POA: Diagnosis present

## 2015-04-14 LAB — COMPREHENSIVE METABOLIC PANEL
ALBUMIN: 3.6 g/dL (ref 3.5–5.0)
ALT: 19 U/L (ref 14–54)
ANION GAP: 12 (ref 5–15)
AST: 25 U/L (ref 15–41)
Alkaline Phosphatase: 49 U/L (ref 38–126)
BUN: 17 mg/dL (ref 6–20)
CALCIUM: 8.6 mg/dL — AB (ref 8.9–10.3)
CO2: 26 mmol/L (ref 22–32)
Chloride: 95 mmol/L — ABNORMAL LOW (ref 101–111)
Creatinine, Ser: 0.75 mg/dL (ref 0.44–1.00)
GFR calc Af Amer: >60 mL/min (ref >60)
GFR calc non Af Amer: >60 mL/min (ref >60)
GLUCOSE: 121 mg/dL — AB (ref 65–99)
POTASSIUM: 2.8 mmol/L — AB (ref 3.5–5.1)
Sodium: 133 mmol/L — ABNORMAL LOW (ref 135–145)
TOTAL PROTEIN: 6.7 g/dL (ref 6.5–8.1)
Total Bilirubin: 0.5 mg/dL (ref 0.3–1.2)

## 2015-04-14 LAB — URINALYSIS, ROUTINE W REFLEX MICROSCOPIC
Glucose, UA: NEGATIVE mg/dL
KETONES UR: 40 mg/dL — AB
Nitrite: POSITIVE — AB
PH: 6 (ref 5.0–8.0)
Protein, ur: NEGATIVE mg/dL
Specific Gravity, Urine: >1.03 — ABNORMAL HIGH (ref 1.005–1.030)
UROBILINOGEN UA: 0.2 mg/dL (ref 0.0–1.0)

## 2015-04-14 LAB — CBC
HCT: 40.3 % (ref 36.0–46.0)
HEMOGLOBIN: 13.7 g/dL (ref 12.0–15.0)
MCH: 31.1 pg (ref 26.0–34.0)
MCHC: 34 g/dL (ref 30.0–36.0)
MCV: 91.4 fL (ref 78.0–100.0)
PLATELETS: 323 10*3/uL (ref 150–400)
RBC: 4.41 MIL/uL (ref 3.87–5.11)
RDW: 13.5 % (ref 11.5–15.5)
WBC: 18.5 10*3/uL — ABNORMAL HIGH (ref 4.0–10.5)

## 2015-04-14 LAB — URINE MICROSCOPIC-ADD ON

## 2015-04-14 LAB — LIPASE, BLOOD: LIPASE: 18 U/L — AB (ref 22–51)

## 2015-04-14 NOTE — ED Notes (Signed)
Pt. reports chronic/persistent diarrhea for 9 weeks , nausea , fatigue and generalized weakness. Pt. added left shoulder pain denies injury , states history of fibromyalgia . Denies fever or chills. Seen her gastroenterologist last week currently taking Flagyl and probiotics.

## 2015-04-15 ENCOUNTER — Encounter (HOSPITAL_COMMUNITY)
Admission: EM | Disposition: A | Discharge status: Home / Self Care | Payer: Self-pay | Source: Home / Self Care | Attending: Internal Medicine

## 2015-04-15 ENCOUNTER — Encounter (HOSPITAL_COMMUNITY): Payer: Self-pay | Admitting: Radiology

## 2015-04-15 ENCOUNTER — Inpatient Hospital Stay (HOSPITAL_COMMUNITY)
Admission: EM | Admit: 2015-04-15 | Discharge: 2015-04-17 | DRG: 392 | Disposition: A | Discharge status: Home / Self Care | Payer: Medicare Other | Attending: Internal Medicine | Admitting: Internal Medicine

## 2015-04-15 ENCOUNTER — Emergency Department (HOSPITAL_COMMUNITY): Payer: Medicare Other

## 2015-04-15 DIAGNOSIS — M1990 Unspecified osteoarthritis, unspecified site: Secondary | ICD-10-CM | POA: Diagnosis present

## 2015-04-15 DIAGNOSIS — I10 Essential (primary) hypertension: Secondary | ICD-10-CM | POA: Diagnosis present

## 2015-04-15 DIAGNOSIS — E559 Vitamin D deficiency, unspecified: Secondary | ICD-10-CM | POA: Diagnosis present

## 2015-04-15 DIAGNOSIS — E871 Hypo-osmolality and hyponatremia: Secondary | ICD-10-CM | POA: Diagnosis present

## 2015-04-15 DIAGNOSIS — E890 Postprocedural hypothyroidism: Secondary | ICD-10-CM | POA: Diagnosis present

## 2015-04-15 DIAGNOSIS — I447 Left bundle-branch block, unspecified: Secondary | ICD-10-CM | POA: Diagnosis present

## 2015-04-15 DIAGNOSIS — Z880 Allergy status to penicillin: Secondary | ICD-10-CM | POA: Diagnosis not present

## 2015-04-15 DIAGNOSIS — E876 Hypokalemia: Secondary | ICD-10-CM | POA: Diagnosis present

## 2015-04-15 DIAGNOSIS — M797 Fibromyalgia: Secondary | ICD-10-CM | POA: Diagnosis present

## 2015-04-15 DIAGNOSIS — D509 Iron deficiency anemia, unspecified: Secondary | ICD-10-CM | POA: Diagnosis present

## 2015-04-15 DIAGNOSIS — R1032 Left lower quadrant pain: Secondary | ICD-10-CM

## 2015-04-15 DIAGNOSIS — K529 Noninfective gastroenteritis and colitis, unspecified: Secondary | ICD-10-CM | POA: Diagnosis present

## 2015-04-15 DIAGNOSIS — N8320 Unspecified ovarian cysts: Secondary | ICD-10-CM | POA: Diagnosis present

## 2015-04-15 DIAGNOSIS — Z79899 Other long term (current) drug therapy: Secondary | ICD-10-CM | POA: Diagnosis not present

## 2015-04-15 DIAGNOSIS — M47816 Spondylosis without myelopathy or radiculopathy, lumbar region: Secondary | ICD-10-CM | POA: Diagnosis present

## 2015-04-15 DIAGNOSIS — M810 Age-related osteoporosis without current pathological fracture: Secondary | ICD-10-CM | POA: Diagnosis present

## 2015-04-15 DIAGNOSIS — N6029 Fibroadenosis of unspecified breast: Secondary | ICD-10-CM | POA: Diagnosis present

## 2015-04-15 DIAGNOSIS — H8110 Benign paroxysmal vertigo, unspecified ear: Secondary | ICD-10-CM | POA: Diagnosis present

## 2015-04-15 DIAGNOSIS — E860 Dehydration: Secondary | ICD-10-CM | POA: Diagnosis not present

## 2015-04-15 DIAGNOSIS — K5730 Diverticulosis of large intestine without perforation or abscess without bleeding: Secondary | ICD-10-CM | POA: Diagnosis present

## 2015-04-15 DIAGNOSIS — R197 Diarrhea, unspecified: Secondary | ICD-10-CM | POA: Diagnosis present

## 2015-04-15 DIAGNOSIS — M542 Cervicalgia: Secondary | ICD-10-CM | POA: Diagnosis present

## 2015-04-15 DIAGNOSIS — M069 Rheumatoid arthritis, unspecified: Secondary | ICD-10-CM | POA: Diagnosis present

## 2015-04-15 DIAGNOSIS — E039 Hypothyroidism, unspecified: Secondary | ICD-10-CM | POA: Diagnosis present

## 2015-04-15 DIAGNOSIS — Z888 Allergy status to other drugs, medicaments and biological substances status: Secondary | ICD-10-CM | POA: Diagnosis not present

## 2015-04-15 DIAGNOSIS — F411 Generalized anxiety disorder: Secondary | ICD-10-CM | POA: Diagnosis present

## 2015-04-15 DIAGNOSIS — Z7952 Long term (current) use of systemic steroids: Secondary | ICD-10-CM | POA: Diagnosis not present

## 2015-04-15 DIAGNOSIS — J310 Chronic rhinitis: Secondary | ICD-10-CM | POA: Diagnosis present

## 2015-04-15 DIAGNOSIS — K52838 Other microscopic colitis: Secondary | ICD-10-CM | POA: Diagnosis present

## 2015-04-15 HISTORY — PX: COLONOSCOPY: SHX5424

## 2015-04-15 HISTORY — DX: Hypothyroidism, unspecified: E03.9

## 2015-04-15 HISTORY — DX: Diarrhea, unspecified: R19.7

## 2015-04-15 LAB — MAGNESIUM: Magnesium: 1.9 mg/dL (ref 1.7–2.4)

## 2015-04-15 LAB — BASIC METABOLIC PANEL
ANION GAP: 8 (ref 5–15)
BUN: 8 mg/dL (ref 6–20)
CHLORIDE: 105 mmol/L (ref 101–111)
CO2: 25 mmol/L (ref 22–32)
Calcium: 7.8 mg/dL — ABNORMAL LOW (ref 8.9–10.3)
Creatinine, Ser: 0.61 mg/dL (ref 0.44–1.00)
Glucose, Bld: 93 mg/dL (ref 65–99)
POTASSIUM: 3.6 mmol/L (ref 3.5–5.1)
Sodium: 138 mmol/L (ref 135–145)

## 2015-04-15 SURGERY — COLONOSCOPY
Anesthesia: Moderate Sedation

## 2015-04-15 SURGERY — SIGMOIDOSCOPY, FLEXIBLE
Anesthesia: Moderate Sedation

## 2015-04-15 MED ORDER — ACETAMINOPHEN 650 MG RE SUPP: 650.0000 mg | Freq: Four times a day (QID) | RECTAL | Status: DC | PRN

## 2015-04-15 MED ORDER — ALBUTEROL SULFATE (2.5 MG/3ML) 0.083% IN NEBU: 2.5000 mg | INHALATION_SOLUTION | RESPIRATORY_TRACT | Status: DC | PRN

## 2015-04-15 MED ORDER — POTASSIUM CHLORIDE CRYS ER 20 MEQ PO TBCR
60.0000 meq | EXTENDED_RELEASE_TABLET | Freq: Once | ORAL | Status: AC
  Administered 2015-04-15: 60 meq via ORAL
  Filled 2015-04-15: qty 3

## 2015-04-15 MED ORDER — METRONIDAZOLE IN NACL 5-0.79 MG/ML-% IV SOLN
500.0000 mg | Freq: Once | INTRAVENOUS | Status: AC
  Administered 2015-04-15: 500 mg via INTRAVENOUS
  Filled 2015-04-15: qty 100

## 2015-04-15 MED ORDER — SODIUM CHLORIDE 0.9 % IJ SOLN
3.0000 mL | Freq: Two times a day (BID) | INTRAMUSCULAR | Status: DC
  Administered 2015-04-16 – 2015-04-17 (×2): 3 mL via INTRAVENOUS

## 2015-04-15 MED ORDER — SODIUM CHLORIDE 0.9 % IV SOLN: INTRAVENOUS | Status: DC

## 2015-04-15 MED ORDER — LEVOTHYROXINE SODIUM 25 MCG PO TABS
25.0000 ug | ORAL_TABLET | Freq: Every day | ORAL | Status: DC
  Filled 2015-04-15 (×2): qty 1

## 2015-04-15 MED ORDER — ACETAMINOPHEN 325 MG PO TABS
650.0000 mg | ORAL_TABLET | Freq: Four times a day (QID) | ORAL | Status: DC | PRN
Start: 2015-04-15 — End: 2015-04-17

## 2015-04-15 MED ORDER — METRONIDAZOLE IN NACL 5-0.79 MG/ML-% IV SOLN
500.0000 mg | Freq: Three times a day (TID) | INTRAVENOUS | Status: DC
  Administered 2015-04-15 – 2015-04-16 (×3): 500 mg via INTRAVENOUS
  Filled 2015-04-15 (×3): qty 100

## 2015-04-15 MED ORDER — TRAMADOL HCL 50 MG PO TABS
50.0000 mg | ORAL_TABLET | Freq: Four times a day (QID) | ORAL | Status: DC | PRN
  Administered 2015-04-15 – 2015-04-17 (×6): 50 mg via ORAL
  Filled 2015-04-15 (×6): qty 1

## 2015-04-15 MED ORDER — FOLIC ACID 1 MG PO TABS
1.0000 mg | ORAL_TABLET | Freq: Every day | ORAL | Status: DC
Start: 2015-04-15 — End: 2015-04-17
  Administered 2015-04-15 – 2015-04-17 (×3): 1 mg via ORAL
  Filled 2015-04-15 (×3): qty 1

## 2015-04-15 MED ORDER — SODIUM CHLORIDE 0.9 % IV BOLUS (SEPSIS)
2000.0000 mL | Freq: Once | INTRAVENOUS | Status: AC
  Administered 2015-04-15: 2000 mL via INTRAVENOUS

## 2015-04-15 MED ORDER — CIPROFLOXACIN IN D5W 400 MG/200ML IV SOLN
400.0000 mg | Freq: Once | INTRAVENOUS | Status: AC
  Administered 2015-04-15: 400 mg via INTRAVENOUS
  Filled 2015-04-15: qty 200

## 2015-04-15 MED ORDER — PREDNISONE 5 MG PO TABS
5.0000 mg | ORAL_TABLET | Freq: Every day | ORAL | Status: DC
  Administered 2015-04-16 – 2015-04-17 (×2): 5 mg via ORAL
  Filled 2015-04-15 (×2): qty 1

## 2015-04-15 MED ORDER — CIPROFLOXACIN IN D5W 400 MG/200ML IV SOLN
400.0000 mg | Freq: Two times a day (BID) | INTRAVENOUS | Status: DC
  Administered 2015-04-15 – 2015-04-16 (×2): 400 mg via INTRAVENOUS
  Filled 2015-04-15 (×2): qty 200

## 2015-04-15 MED ORDER — POTASSIUM CHLORIDE 10 MEQ/100ML IV SOLN
10.0000 meq | INTRAVENOUS | Status: AC
  Administered 2015-04-15 (×2): 10 meq via INTRAVENOUS
  Filled 2015-04-15 (×2): qty 100

## 2015-04-15 MED ORDER — POTASSIUM CHLORIDE IN NACL 40-0.9 MEQ/L-% IV SOLN
INTRAVENOUS | Status: AC
  Administered 2015-04-15 – 2015-04-16 (×2): 100 mL/h via INTRAVENOUS
  Filled 2015-04-15 (×4): qty 1000

## 2015-04-15 MED ORDER — DIPHENHYDRAMINE HCL 50 MG/ML IJ SOLN
INTRAMUSCULAR | Status: AC
Start: 2015-04-15 — End: 2015-04-15
  Filled 2015-04-15: qty 1

## 2015-04-15 MED ORDER — MIDAZOLAM HCL 5 MG/5ML IJ SOLN
INTRAMUSCULAR | Status: DC | PRN
  Administered 2015-04-15: 1 mg via INTRAVENOUS
  Administered 2015-04-15 (×2): 2 mg via INTRAVENOUS

## 2015-04-15 MED ORDER — FENTANYL CITRATE (PF) 100 MCG/2ML IJ SOLN
INTRAMUSCULAR | Status: AC
  Filled 2015-04-15: qty 2

## 2015-04-15 MED ORDER — ENOXAPARIN SODIUM 40 MG/0.4ML ~~LOC~~ SOLN
40.0000 mg | SUBCUTANEOUS | Status: DC
  Administered 2015-04-15 – 2015-04-16 (×2): 40 mg via SUBCUTANEOUS
  Filled 2015-04-15 (×2): qty 0.4

## 2015-04-15 MED ORDER — RALOXIFENE HCL 60 MG PO TABS
60.0000 mg | ORAL_TABLET | Freq: Every day | ORAL | Status: DC
  Administered 2015-04-15 – 2015-04-17 (×3): 60 mg via ORAL
  Filled 2015-04-15 (×3): qty 1

## 2015-04-15 MED ORDER — MIDAZOLAM HCL 5 MG/ML IJ SOLN
INTRAMUSCULAR | Status: AC
  Filled 2015-04-15: qty 2

## 2015-04-15 MED ORDER — FENTANYL CITRATE (PF) 100 MCG/2ML IJ SOLN
INTRAMUSCULAR | Status: DC | PRN
  Administered 2015-04-15 (×2): 25 ug via INTRAVENOUS
  Administered 2015-04-15: 12.5 ug via INTRAVENOUS

## 2015-04-15 MED ORDER — AMITRIPTYLINE HCL 10 MG PO TABS
10.0000 mg | ORAL_TABLET | Freq: Every day | ORAL | Status: DC
  Administered 2015-04-15 – 2015-04-16 (×2): 10 mg via ORAL
  Filled 2015-04-15 (×4): qty 1

## 2015-04-15 MED ORDER — DICYCLOMINE HCL 20 MG PO TABS
20.0000 mg | ORAL_TABLET | Freq: Four times a day (QID) | ORAL | Status: DC | PRN
  Filled 2015-04-15: qty 1

## 2015-04-15 MED ORDER — IOHEXOL 300 MG/ML  SOLN
75.0000 mL | Freq: Once | INTRAMUSCULAR | Status: AC | PRN
  Administered 2015-04-15: 75 mL via INTRAVENOUS

## 2015-04-15 MED ORDER — DIPHENOXYLATE-ATROPINE 2.5-0.025 MG PO TABS: 1.0000 | ORAL_TABLET | Freq: Four times a day (QID) | ORAL | Status: DC | PRN

## 2015-04-15 NOTE — ED Notes (Signed)
PA at bedside.

## 2015-04-15 NOTE — Progress Notes (Signed)
Patient trasfered from ED to 5W29 via stretcher; alert and oriented x 4; no complaints of pain; IV saline locked in RW; skin intact. Orient patient to room and unit; gave patient care guide; instructed how to use the call bell and  fall risk precautions. Will continue to monitor the patient.

## 2015-04-15 NOTE — ED Notes (Signed)
Dr. Claudine Mouton  at bedside with patient and family.

## 2015-04-15 NOTE — Op Note (Signed)
[facility] [place], [address]   COLONOSCOPY PROCEDURE REPORT  PATIENT: Joan [name], Joan [name]  MR#: [id number] BIRTHDATE: 1946-02-23 , 59  yrs. old GENDER: female ENDOSCOPIST: Gatha Mayer, MD, [facility] PROCEDURE DATE:  04/15/2015 PROCEDURE:   Colonoscopy, diagnostic and Colonoscopy with biopsy First Screening Colonoscopy - Avg.  risk and is 50 yrs.  old or older - No.  Prior Negative Screening - Now for repeat screening. N/A  History of Adenoma - Now for follow-up colonoscopy & has been > or = to 3 yrs.  N/A  Polyps removed today? No Recommend repeat exam, <10 yrs? No ASA CLASS:   Class II INDICATIONS:Clinically significant diarrhea of unexplained origin and Patient is not applicable for Colorectal Neoplasm Risk Assessment for this procedure. MEDICATIONS: Versed 5 mg IV and Fentanyl 62.5 mg IV  DESCRIPTION OF PROCEDURE:   After the risks benefits and alternatives of the procedure were thoroughly explained, informed consent was obtained.  The digital rectal exam revealed no abnormalities of the rectum.   The Pentax Adult Colon [id number] endoscope was introduced through the anus and advanced to the terminal ileum which was intubated for a short distance. No adverse events experienced.   The quality of the prep was none good.  The instrument was then slowly withdrawn as the colon was fully examined. Estimated blood loss is zero unless otherwise noted in this procedure report.      COLON FINDINGS: There was severe diverticulosis noted in the sigmoid colon with associated luminal narrowing and muscular hypertrophy. The colonic mucosa appeared normal throughout the entire examined colon.  Multiple random biopsies were performed using cold forceps. Samples were sent to R/O microscopic colitis.  Retroflexed views revealed no abnormalities. The time to cecum = 3.4 Withdrawal time = 10.3   The scope was withdrawn and the procedure  completed. COMPLICATIONS: There were no immediate complications.  ENDOSCOPIC IMPRESSION: 1.   There was severe diverticulosis noted in the sigmoid colon 2.   The colonic mucosa appeared normal throughout the entire examined colon; multiple random biopsies were performed using cold forceps  RECOMMENDATIONS: Await pathology results  eSigned:  Gatha Mayer, MD, [facility] 04/15/2015 4:28 PM

## 2015-04-15 NOTE — Consult Note (Signed)
[facility] Consult: 10:36 AM 04/15/2015  LOS: 0 days    Referring Provider:  Dr Algis Liming Primary Care Physician:  Estill Dooms, MD Primary Gastroenterologist:  Dr. Henrene Pastor    Reason for Consultation: Diarrhea.   HPI: Joan [name] is a 69 y.o. female.  History: Rhematoid arthritis.  Fibromyalgia. Iron def anemia. Thyroid nodule.  Hypothyroidism.  Alopecia.  Anxiety.  L BBB.   11/2005 Colonoscopy.  For diarrhea and weight loss.  Sigmoid diverticulosis and int rrhoids. No biopsies.  11/2005 EGD.  Esophageal stricture, dilated.  Duodenal biopsy: pathology benign SB mucosa.  2007 SBFT. Negative.   Upwards of 3 months hx diarrhea.  3 to 4 watery, non-bloody stools per 24 hours. Began ~ 2 weeks after starting new thyroid med. Had previously used this with no problem. Medications used to try to control symptoms include Imodium, Lomotil, Bentyl and more recently metronidazole, Florastor: None of these helped.  No exposure tablet via within the last several months. Home water supply is a well, however her husband has no GI symptoms. No exposure to children in diapers. Her only travel has been to [facility] on occasion. When she is there, she only drinks bottled water. Diminished appetite and po intake, as meals initiate diarrhea.  No abdominal pain or significant cramping. No nausea/vomiting. 9 # weight loss.  Studies thus far include C diff A & B EIA,  and repeat c diff PCR, stool cultures, IgA, TTG, TSH all negative.  Stool Giardia is pending. No stool fat studies.  GI added empiric 10 day course Metronidazole, added florastor, BID Colestid.  Was set up for colonoscopy on 10/4.  However the patient is increasingly weak and stools have failed to respond to Florastor  metronidazole. She was not to start the Colestid until  after she finished the metronidazole..  In the ED a temperature of 101.9 F noted at initial measurement but afebrile since.  WBCs 18 K.  urine analysis shows small leukocytes and is positive for nitrites, but only 0-2 WBCs. The hospitalist M.D. has empirically started on IV metronidazole and IV ciprofloxacin.   Past Medical History  Diagnosis Date  . Hypothyroidism   . Impacted cerumen     right ear  . Thyrotoxicosis   . Unspecified vitamin D deficiency   . Nontoxic uninodular goiter   . Other left bundle branch block   . External hemorrhoids without mention of complication   . Alopecia areata 02/16/2006  . Chronic rhinitis 09/01/05  . Myalgia and myositis, unspecified 03/01/05  . Rheumatoid arthritis(714.0) 10/05/2004    lumbar spondylosis. Lumbo-sacral anterolisthesis    . Dysphagia, unspecified(787.20) 2004  . Anxiety state, unspecified 2004  . Benign paroxysmal positional vertigo 04/01/03  . 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 Osteoarthrosis, unspecified whether generalized or localized, unspecified site 04/01/03  . Cervicalgia 04/01/03  . Senile osteoporosis 04/01/03  . Hypertension 2004  . Enthesopathy of ankle and tarsus, unspecified 03/31/96  . Corns and callosities   . Dermatophytosis of nail   . Thyrotoxicosis without mention of goiter or other  cause, without mention of thyrotoxic crisis or storm   . Lumbago   . Urinary frequency   . Sprain of metatarsophalangeal (joint) of foot   . Fibromyalgia     Past Surgical History  Procedure Laterality Date  . Tubal ligation  1973  . Tonsillectomy and adenoidectomy  1974  . Right wrist sugery for tenosynovitis  1997    DR SYPHER   . Remove ganglion  1998    DR SYPHER   . Foot surgery Right 05/22/2014    Dr.Strom     Prior to Admission medications   Medication Sig Start Date End Date Taking? Authorizing Provider  acetaminophen (TYLENOL) 500 MG tablet Take 500 mg by mouth every 6 (six) hours as needed for pain. Take two  tablets up to four times a day as needed   Yes Historical Provider, MD  amitriptyline (ELAVIL) 10 MG tablet TAKE ONE TABLET BY MOUTH AT BEDTIME TO HELP  RELAX MUSCLES 07/22/14  Yes Estill Dooms, MD  CALCIUM PO Take 1 tablet by mouth daily.   Yes Historical Provider, MD  Cholecalciferol (VITAMIN D PO) Take 1 tablet by mouth daily.   Yes Historical Provider, MD  Diclofenac-Misoprostol 75-0.2 MG TBEC Take 1 tablet twice daily for arthritis. 03/23/15  Yes Estill Dooms, MD  dicyclomine (BENTYL) 20 MG tablet One every 6 hours as needed to control diarrhea. 03/23/15  Yes Estill Dooms, MD  diphenoxylate-atropine (LOMOTIL) 2.5-0.025 MG per tablet Take one tablet by mouth four times daily as needed for diarrhea or loose stools 03/23/15  Yes Estill Dooms, MD  EVISTA 60 MG tablet Take one tablet once daily for osteoporosis 11/22/12  Yes Historical Provider, MD  fluticasone (FLONASE) 50 MCG/ACT nasal spray Place 1 spray into the nose daily. 07/22/14  Yes Estill Dooms, MD  folic acid (FOLVITE) 1 MG tablet Take 1 mg by mouth daily. Take two tablets every morning. 10/14/12  Yes Historical Provider, MD  Glucosamine-Chondroit-Vit C-Mn (GLUCOSAMINE CHONDR 500 COMPLEX PO) Take 500 mg by mouth daily. Take 2 tablets daily for joint health.   Yes Historical Provider, MD  hydrochlorothiazide (MICROZIDE) 12.5 MG capsule TAKE ONE CAPSULE BY MOUTH ONCE DAILY FOR BLOOD PRESSURE 03/23/15  Yes Estill Dooms, MD  levothyroxine (SYNTHROID, LEVOTHROID) 25 MCG tablet Take 25 mcg by mouth daily before breakfast.   Yes Historical Provider, MD  metroNIDAZOLE (FLAGYL) 250 MG tablet Take 1 tablet (250 mg total) by mouth 3 (three) times daily. 04/05/15  Yes Lori P Hvozdovic, PA-C  Multiple Vitamin (MULTIVITAMIN) tablet Take 1 tablet by mouth daily. Take 1 tablet once a daily   Yes Historical Provider, MD  mupirocin cream (BACTROBAN) 2 % Apply once daily to the affected area with dressing change 02/19/14  Yes Harriet Masson, DPM  Omega-3  Fatty Acids (FISH OIL) 1000 MG CAPS Take 1,000 mg by mouth daily. Take one tablet once a daily for heart, joint and skin health.   Yes Historical Provider, MD  predniSONE (DELTASONE) 5 MG tablet Take 5 mg by mouth daily with breakfast.   Yes Historical Provider, MD  traMADol (ULTRAM) 50 MG tablet TAKE ONE TABLET BY MOUTH 4 TIMES DAILY AS NEEDED FOR PAIN 03/05/15  Yes Tiffany L Reed, DO  urea (CARMOL) 20 % cream Apply to callus daily 12/09/14  Yes Estill Dooms, MD  colestipol (COLESTID) 1 G tablet Take 1 tablet (1 g total) by mouth 2 (two) times daily. 04/05/15   Lori P Hvozdovic, PA-C  ENBREL SURECLICK 50 MG/ML injection Inject 50 mg as directed once a week. Inject once weekly for arthritis 11/30/12   Historical Provider, MD  MOVIPREP 100 G SOLR Take 1 kit (200 g total) by mouth once. 04/05/15   Lori P Hvozdovic, PA-C    Scheduled Meds:   Infusions: . ciprofloxacin Stopped (04/15/15 0945)  . metronidazole     PRN Meds:    Allergies as of 04/14/2015 - Review Complete 04/14/2015  Allergen Reaction Noted  . Arava [leflunomide] Diarrhea, Nausea Only, and Other (See Comments) 01/15/2013  . Neurontin [gabapentin] Nausea And Vomiting 11/01/2012  . Penicillins Rash 05/31/2012    Family History  Problem Relation Age of Onset  . Heart attack Father     age 75  . Heart disease Father     Social History   Social History  . Marital Status: Married    Spouse Name: N/A  . Number of Children: N/A  . Years of Education: N/A   Occupational History  . Not on file.   Social History Main Topics  . Smoking status: Never Smoker   . Smokeless tobacco: Never Used  . Alcohol Use: No  . Drug Use: No  . Sexual Activity: Not on file   Other Topics Concern  . Not on file   Social History Narrative    REVIEW OF SYSTEMS: Constitutional:  Weakness, 9 pound weight loss. No falls. ENT:  No nose bleeds Pulm:  No cough, no dyspnea. CV:  No palpitations, no LE edema. No chest pain GU:  No hematuria,  no frequency.  No oliguria. GI:  Per HPI.  No dysphagia or significant reflux Heme:  No unusual bleeding or bruising   Transfusions:  None per her recall.  Neuro:  No headaches, no peripheral tingling or numbness MS: Chronically bothered by low back pain sometimes this radiates into the legs. Derm:  No itching, no rash or sores.  Endocrine:  + chills though only yesterday..  No polyuria or dysuria Immunization:  Did not inquire. Travel: per HPI   PHYSICAL EXAM: Vital signs in last 24 hours: Filed Vitals:   04/15/15 1000  BP: 116/55  Pulse: 81  Temp:  98.3. MAXIMUM TEMPERATURE 101.9.   Resp: 20   Wt Readings from Last 3 Encounters:  04/14/15 102 lb (46.267 kg)  04/05/15 104 lb 2 oz (47.231 kg)  03/23/15 109 lb 9.6 oz (49.714 kg)    General: Pleasant, frail and somewhat ill appearing, weak older WF Head:  No facial asymmetry, swelling or signs of trauma.  Eyes:  No scleral icterus. No conjunctival pallor. EOMI. Ears:  Hearing intact without obvious deficits  Nose:  No congestion or nasal discharge Mouth:  Clear, slightly dry oral mucosa. Dentition in good repair. Neck:  No JVD, no masses,. Although there is some swelling at the base of the neck. There is no palpable goiter.  Neck is not tender. Lungs:  Somewhat diminished breath sounds but clear. No cough. No dyspnea. Heart: RRR. No MRG. S1/S2 audible. Abdomen:  Soft, thin, no organomegaly or masses. No hernias. Bowel sounds hypoactive but no tinkling or tympanitic bowel sounds. No tenderness..   Rectal: Deferred   Musc/Skeltl: Rheumatoid changes in the hands/fingers. Slight kyphosis. Extremities:  No CCE. Feet warm with good perfusion.  Neurologic:  Oriented 3. Alert. Moves all 4 limbs, strength not tested. No gross neurologic deficits. No tremor. Skin:  No telangiectasia, no rashes or sores. Tattoos:  None Nodes:  No cervical or inguinal adenopathy.  Psych:  Cooperative, pleasant, relaxed.  Intake/Output from previous  day: 08/10 0701 - 08/11 0700 In: 2200 [I.V.:2200] Out: 425 [Urine:425] Intake/Output this shift:    LAB RESULTS:  Recent Labs  04/14/15 2137  WBC 18.5*  HGB 13.7  HCT 40.3  PLT 323   BMET Lab Results  Component Value Date   NA 138 04/15/2015   NA 133* 04/14/2015   NA 142 11/30/2014   K 3.6 04/15/2015   K 2.8* 04/14/2015   K 4.1 11/30/2014   CL 105 04/15/2015   CL 95* 04/14/2015   CL 101 11/30/2014   CO2 25 04/15/2015   CO2 26 04/14/2015   CO2 26 11/30/2014   GLUCOSE 93 04/15/2015   GLUCOSE 121* 04/14/2015   GLUCOSE 87 11/30/2014   BUN 8 04/15/2015   BUN 17 04/14/2015   BUN 23 11/30/2014   CREATININE 0.61 04/15/2015   CREATININE 0.75 04/14/2015   CREATININE 0.69 11/30/2014   CALCIUM 7.8* 04/15/2015   CALCIUM 8.6* 04/14/2015   CALCIUM 9.2 11/30/2014   LFT  Recent Labs  04/14/15 2137  PROT 6.7  ALBUMIN 3.6  AST 25  ALT 19  ALKPHOS 49  BILITOT 0.5   PT/INR No results found for: INR, PROTIME Hepatitis Panel No results for input(s): HEPBSAG, HCVAB, HEPAIGM, HEPBIGM in the last 72 hours. C-Diff No components found for: CDIFF Lipase     Component Value Date/Time   LIPASE 18* 04/14/2015 2137    Drugs of Abuse  No results found for: LABOPIA, COCAINSCRNUR, LABBENZ, AMPHETMU, THCU, LABBARB   RADIOLOGY STUDIES: Ct Abdomen Pelvis W Contrast  04/15/2015   CLINICAL DATA:  Chronic left lower quadrant abdominal pain and diarrhea. Initial encounter.  EXAM: CT ABDOMEN AND PELVIS WITH CONTRAST  TECHNIQUE: Multidetector CT imaging of the abdomen and pelvis was performed using the standard protocol following bolus administration of intravenous contrast.  CONTRAST:  105m OMNIPAQUE IOHEXOL 300 MG/ML  SOLN  COMPARISON:  CT of the abdomen and pelvis performed 09/11/2011  FINDINGS: The visualized lung bases are clear.  The liver and spleen are unremarkable in appearance. The gallbladder is within normal limits. The pancreas and adrenal glands are unremarkable.  The  kidneys are unremarkable in appearance. There is no evidence of hydronephrosis. No renal or ureteral stones are seen. No perinephric stranding is appreciated.  No free fluid is identified. The small bowel is unremarkable in appearance. The stomach is within normal limits. No acute vascular abnormalities are seen. Scattered calcification is noted along the abdominal aorta.  The appendix is not well characterized; there is no evidence for appendicitis. Mild mucosal thickening is suggested along the sigmoid colon, which could reflect a mild infectious or inflammatory process.  The bladder is mildly distended and grossly unremarkable. The uterus is unremarkable in appearance. A 4.2 cm cyst is noted at the left ovary. The ovaries are otherwise grossly unremarkable. No inguinal lymphadenopathy is seen.  No acute osseous abnormalities are identified. Vacuum phenomenon is noted at L3-L4, and L5-S1. There is grade 2 anterolisthesis of L5 on S1, reflecting underlying chronic bilateral pars defects at L5.  IMPRESSION: 1. Mild mucosal thickening suggested along the sigmoid colon, which could reflect a mild infectious or inflammatory process. 2. Scattered calcification along the abdominal aorta. 3. 4.2 cm cyst at the left ovary. Pelvic ultrasound could be considered for further evaluation, on an elective nonemergent basis. 4. Degenerative change at the lower lumbar spine. Grade 2 anterolisthesis of L5 on S1, reflecting underlying chronic bilateral pars defects at  L5.   Electronically Signed   By: Garald Balding M.D.   On: 04/15/2015 04:51    ENDOSCOPIC STUDIES: Per HPI  IMPRESSION:   *  At least 2 months history of unexplained diarrhea. Thus far infectious workup is negative.  Question microscopic colitis. Question medication side effect.  *  Remote history of nontoxic nodular goiter with previous chemical ablation. Develop hypothyroidism in 01/2015 at which point she was started on levothyroxine.  TSH of 8/10 + 1.6.    *  Long-standing rheumatoid arthritis. Meds for this include low-dose prednisone, Arthrotec, Ultram, weekly Enbrel.  Arthrotec is known to lead to diarrhea, however the patient has been taking this for many years without GI side effects.    PLAN:     *  Colonoscopy without prep set up for this afternoon. This will be an unprepped study. However due to her recently consuming oral contrast, she is having something of a bowel purge on top of her chronic diarrhea.   Azucena Freed  04/15/2015, 10:36 AM Pager: [phone]     [facility] Attending  I have also seen and assessed the patient and agree with the advanced practitioner's assessment and plan. Givenm immunosuppression CMV is possible  Gatha Mayer, MD, [facility] [phone] (pager) 04/15/2015 3:44 PM

## 2015-04-15 NOTE — Progress Notes (Signed)
   Chart reviewed and discussed patient w/ Dr. Algis Liming  Advised dc stool pathogen panel Anticipate unprepped sigmoidoscopy with moderate sedation later today to assess diarrhea and abnormal sigmoid on CT scan.  Full consult note from Azucena Freed, PA-C to follow. I have placed an order for the sigmoidoscopy.   Gatha Mayer, MD, [facility] [phone] (pager) 04/15/2015 8:35 AM

## 2015-04-15 NOTE — H&P (Signed)
History and Physical  Joan [name]:[id number] DOB: April 24, 1946 DOA: 04/15/2015  Referring physician: Dr. Everlene Balls, EDP PCP: Estill Dooms, MD  Outpatient Specialists:  1. Gastroenterology: Dr. Scarlette Shorts  Chief Complaint: Persistent diarrhea and abdominal pain  HPI: Joan [name] is a 69 y.o. female with PMH of hypothyroid, vitamin D deficiency, LBBB, RA on chronic prednisone, HTN, ongoing chronic diarrhea and abdominal pain of 9 weeks duration, presented to [facility] ED on 04/14/15 with complaints of persistent diarrhea, abdominal pain, generalized weakness. She states that for the last 9 weeks she has had persistent watery diarrhea, varying frequency but up to 4 times daily at least, no blood or mucus in stools, associated with mostly left lower quadrant abdominal pain-moderate in intensity and intermittent, decreased appetite, 10 pound weight loss, no fever or chills, no nausea or vomiting. She was seen by her gastroenterologist on 8/1, stool workup was negative for C. difficile toxin, Giardia and cryptosporidium. She was started on Flagyl and despite being on it for 1-1/2 weeks has not seen significant relief. She thereby presented to the ED where fever off 101.9, lab work significant for sodium 133, potassium 2.8, WBC 18.5 and CT abdomen shows findings suggestive of sigmoid colitis. She has received a dose of IV Cipro, Flagyl, oral and IV potassium. Hospitalist admission was requested.   Review of Systems: All systems reviewed and apart from history of presenting illness, are negative.  Past Medical History  Diagnosis Date  . Thyroid disease     hypothyroidism  . Impacted cerumen     right ear  . Thyrotoxicosis   . Unspecified vitamin D deficiency   . Nontoxic uninodular goiter   . Other left bundle branch block   . External hemorrhoids without mention of complication   . Alopecia areata 02/16/2006  . Chronic rhinitis 09/01/05  . Pain in joint, site unspecified 03/01/05  .  Myalgia and myositis, unspecified 03/01/05  . Rheumatoid arthritis(714.0) 10/05/2004  . Dysphagia, unspecified(787.20) 2004  . Anxiety state, unspecified 2004  . Benign paroxysmal positional vertigo 04/01/03  . 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 Osteoarthrosis, unspecified whether generalized or localized, unspecified site 04/01/03  . Cervicalgia 04/01/03  . Senile osteoporosis 04/01/03  . Hypertension 2004  . Enthesopathy of ankle and tarsus, unspecified 03/31/96  . Foot and toe(s), blister, without mention of infection   . Corns and callosities   . Dermatophytosis of nail   . Other specified circulatory system disorders   . Encounter for long-term (current) use of other medications   . Nontoxic uninodular goiter   . Thyrotoxicosis without mention of goiter or other cause, without mention of thyrotoxic crisis or storm   . Routine general medical examination at a health care facility   . Lumbago   . Urinary frequency   . Goiter, unspecified   . Anxiety state, unspecified   . Osteoarthrosis, unspecified whether generalized or localized, unspecified site   . Cervicalgia   . Osteoporosis, unspecified   . Disorder of bone and cartilage, unspecified   . Other and unspecified nonspecific immunological findings   . Sprain of metatarsophalangeal (joint) of foot   . Unspecified essential hypertension   . Enthesopathy of ankle and tarsus, unspecified   . Fibromyalgia    Past Surgical History  Procedure Laterality Date  . Tubal ligation  1973  . Tonsillectomy and adenoidectomy  1974  . Right wrist sugery for tenosynovitis  1997    DR SYPHER   . Remove ganglion  [place]   . Foot surgery Right 05/22/2014    Dr.Strom    Social History:  reports that she has never smoked. She has never used smokeless tobacco. She reports that she does not drink alcohol or use illicit drugs. Married. Independent of activities of daily living.  Allergies  Allergen Reactions  . Arava [Leflunomide]  Diarrhea, Nausea Only and Other (See Comments)    Loss appetite  . Methotrexate Derivatives     Hair loss  . Neurontin [Gabapentin] Nausea And Vomiting  . Penicillins Rash    Family History  Problem Relation Age of Onset  . Heart attack Father     age 34  . Heart disease Father    Prior to Admission medications   Medication Sig Start Date End Date Taking? Authorizing Provider  acetaminophen (TYLENOL) 500 MG tablet Take 500 mg by mouth every 6 (six) hours as needed for pain. Take two tablets up to four times a day as needed   Yes Historical Provider, MD  amitriptyline (ELAVIL) 10 MG tablet TAKE ONE TABLET BY MOUTH AT BEDTIME TO HELP  RELAX MUSCLES 07/22/14  Yes Estill Dooms, MD  CALCIUM PO Take 1 tablet by mouth daily.   Yes Historical Provider, MD  Cholecalciferol (VITAMIN D PO) Take 1 tablet by mouth daily.   Yes Historical Provider, MD  Diclofenac-Misoprostol 75-0.2 MG TBEC Take 1 tablet twice daily for arthritis. 03/23/15  Yes Estill Dooms, MD  dicyclomine (BENTYL) 20 MG tablet One every 6 hours as needed to control diarrhea. 03/23/15  Yes Estill Dooms, MD  diphenoxylate-atropine (LOMOTIL) 2.5-0.025 MG per tablet Take one tablet by mouth four times daily as needed for diarrhea or loose stools 03/23/15  Yes Estill Dooms, MD  EVISTA 60 MG tablet Take one tablet once daily for osteoporosis 11/22/12  Yes Historical Provider, MD  fluticasone (FLONASE) 50 MCG/ACT nasal spray Place 1 spray into the nose daily. 07/22/14  Yes Estill Dooms, MD  folic acid (FOLVITE) 1 MG tablet Take 1 mg by mouth daily. Take two tablets every morning. 10/14/12  Yes Historical Provider, MD  Glucosamine-Chondroit-Vit C-Mn (GLUCOSAMINE CHONDR 500 COMPLEX PO) Take 500 mg by mouth daily. Take 2 tablets daily for joint health.   Yes Historical Provider, MD  hydrochlorothiazide (MICROZIDE) 12.5 MG capsule TAKE ONE CAPSULE BY MOUTH ONCE DAILY FOR BLOOD PRESSURE 03/23/15  Yes Estill Dooms, MD  levothyroxine  (SYNTHROID, LEVOTHROID) 25 MCG tablet Take 25 mcg by mouth daily before breakfast.   Yes Historical Provider, MD  metroNIDAZOLE (FLAGYL) 250 MG tablet Take 1 tablet (250 mg total) by mouth 3 (three) times daily. 04/05/15  Yes Lori P Hvozdovic, PA-C  Multiple Vitamin (MULTIVITAMIN) tablet Take 1 tablet by mouth daily. Take 1 tablet once a daily   Yes Historical Provider, MD  mupirocin cream (BACTROBAN) 2 % Apply once daily to the affected area with dressing change 02/19/14  Yes Harriet Masson, DPM  Omega-3 Fatty Acids (FISH OIL) 1000 MG CAPS Take 1,000 mg by mouth daily. Take one tablet once a daily for heart, joint and skin health.   Yes Historical Provider, MD  predniSONE (DELTASONE) 5 MG tablet Take 5 mg by mouth daily with breakfast.   Yes Historical Provider, MD  traMADol (ULTRAM) 50 MG tablet TAKE ONE TABLET BY MOUTH 4 TIMES DAILY AS NEEDED FOR PAIN 03/05/15  Yes Tiffany L Reed, DO  urea (CARMOL) 20 % cream Apply to callus daily 12/09/14  Yes Estill Dooms, MD  colestipol (COLESTID) 1 G tablet Take 1 tablet (1 g total) by mouth 2 (two) times daily. 04/05/15   Lori P Hvozdovic, PA-C  ENBREL SURECLICK 50 MG/ML injection Inject 50 mg as directed once a week. Inject once weekly for arthritis 11/30/12   Historical Provider, MD  MOVIPREP 100 G SOLR Take 1 kit (200 g total) by mouth once. 04/05/15   Lori P Hvozdovic, PA-C   Physical Exam: Filed Vitals:   04/15/15 0630 04/15/15 0645 04/15/15 0700 04/15/15 0715  BP: 106/51 102/52 106/54 129/68  Pulse: 78 77 78 101  Temp:      TempSrc:      Resp: _0 Height:      Weight:      SpO2: 100% 99% 100% 100%   MAXIMUM TEMPERATURE 101.9   General exam: Moderately built and thinly nourished middle aged female patient, lying comfortably supine on the gurney in no obvious distress.  Head, eyes and ENT: Nontraumatic and normocephalic. Pupils equally reacting to light and accommodation. Oral mucosa dry.  Neck: Supple. No JVD, carotid bruit or  thyromegaly.  Lymphatics: No lymphadenopathy.  Respiratory system: Clear to auscultation. No increased work of breathing.  Cardiovascular system: S1 and S2 heard, RRR. No JVD, murmurs, gallops, clicks or pedal edema.  Gastrointestinal system: Abdomen is nondistended, soft. Diffuse mild tenderness but mostly in the left middle and lower quadrant without rigidity, guarding or rebound. Normal bowel sounds heard. No organomegaly or masses appreciated.  Central nervous system: Alert and oriented. No focal neurological deficits.  Extremities: Symmetric 5 x 5 power. Peripheral pulses symmetrically felt.   Skin: No rashes or acute findings.  Musculoskeletal system: Negative exam.  Psychiatry: Pleasant and cooperative.   Labs on Admission:  Basic Metabolic Panel:  Recent Labs Lab 04/14/15 2137 04/15/15 0120  NA 133*  --   K 2.8*  --   CL 95*  --   CO2 26  --   GLUCOSE 121*  --   BUN 17  --   CREATININE 0.75  --   CALCIUM 8.6*  --   MG  --  1.9   Liver Function Tests:  Recent Labs Lab 04/14/15 2137  AST 25  ALT 19  ALKPHOS 49  BILITOT 0.5  PROT 6.7  ALBUMIN 3.6    Recent Labs Lab 04/14/15 2137  LIPASE 18*   No results for input(s): AMMONIA in the last 168 hours. CBC:  Recent Labs Lab 04/14/15 2137  WBC 18.5*  HGB 13.7  HCT 40.3  MCV 91.4  PLT 323   Cardiac Enzymes: No results for input(s): CKTOTAL, CKMB, CKMBINDEX, TROPONINI in the last 168 hours.  BNP (last 3 results) No results for input(s): PROBNP in the last 8760 hours. CBG: No results for input(s): GLUCAP in the last 168 hours.  Radiological Exams on Admission: Ct Abdomen Pelvis W Contrast  04/15/2015   CLINICAL DATA:  Chronic left lower quadrant abdominal pain and diarrhea. Initial encounter.  EXAM: CT ABDOMEN AND PELVIS WITH CONTRAST  TECHNIQUE: Multidetector CT imaging of the abdomen and pelvis was performed using the standard protocol following bolus administration of intravenous contrast.   CONTRAST:  42m OMNIPAQUE IOHEXOL 300 MG/ML  SOLN  COMPARISON:  CT of the abdomen and pelvis performed 09/11/2011  FINDINGS: The visualized lung bases are clear.  The liver and spleen are unremarkable in appearance. The gallbladder is within normal limits. The pancreas and adrenal glands are unremarkable.  The kidneys  are unremarkable in appearance. There is no evidence of hydronephrosis. No renal or ureteral stones are seen. No perinephric stranding is appreciated.  No free fluid is identified. The small bowel is unremarkable in appearance. The stomach is within normal limits. No acute vascular abnormalities are seen. Scattered calcification is noted along the abdominal aorta.  The appendix is not well characterized; there is no evidence for appendicitis. Mild mucosal thickening is suggested along the sigmoid colon, which could reflect a mild infectious or inflammatory process.  The bladder is mildly distended and grossly unremarkable. The uterus is unremarkable in appearance. A 4.2 cm cyst is noted at the left ovary. The ovaries are otherwise grossly unremarkable. No inguinal lymphadenopathy is seen.  No acute osseous abnormalities are identified. Vacuum phenomenon is noted at L3-L4, and L5-S1. There is grade 2 anterolisthesis of L5 on S1, reflecting underlying chronic bilateral pars defects at L5.  IMPRESSION: 1. Mild mucosal thickening suggested along the sigmoid colon, which could reflect a mild infectious or inflammatory process. 2. Scattered calcification along the abdominal aorta. 3. 4.2 cm cyst at the left ovary. Pelvic ultrasound could be considered for further evaluation, on an elective nonemergent basis. 4. Degenerative change at the lower lumbar spine. Grade 2 anterolisthesis of L5 on S1, reflecting underlying chronic bilateral pars defects at L5.   Electronically Signed   By: Garald Balding M.D.   On: 04/15/2015 04:51    EKG: None seen in Epic for today  Assessment/Plan Principal Problem:    Colitis Active Problems:   Hypertension   Diarrhea   Hypothyroidism   Dehydration   Hypokalemia   1. Chronic diarrhea/?? Sigmoid colitis: Patient has been extensively evaluated as outpatient by [facility] including negative C. difficile toxin, negative Giardia and cryptosporidium, has failed oral antibiotics/Flagyl trial. Currently presented with persistent diarrhea, fever of 101.65F, leukocytosis and CT abdomen suggesting possible sigmoid colitis. Discussed with and consulted [facility]/Dr. Carlean Purl who recommended checking a C. difficile PCR, continue empiric IV Cipro and Flagyl for now and possibly for flexible sigmoidoscopy later this afternoon. Differential diagnosis-infectious versus noninfectious etiology (microscopic colitis, IBD, neoplasm, polyps) 2. Dehydration with hyponatremia: IV normal saline 3. Hypokalemia: Secondary to GI losses: Replaced aggressively overnight. Check BMP stat and replace as needed 4. Hypothyroid: Continue Synthroid 5. Essential hypertension: Controlled. Hold HCTZ due to dehydration and hypokalemia 6. Rheumatoid arthritis: Continue chronic prednisone but hold Enbrel pending GI evaluation    DVT prophylaxis: Lovenox Code Status: Full  Family Communication: None at bedside  Disposition Plan: DC home when medically stable, possibly 3-4 days.   Time spent: 60 minutes.  Vernell Leep, MD, FACP, FHM. [facility] Pager [phone]  If 7PM-7AM, please contact night-coverage [email or web address] Password [email or web address] 04/15/2015, 8:05 AM

## 2015-04-15 NOTE — ED Notes (Signed)
Meal tray ordered for patient.

## 2015-04-15 NOTE — ED Provider Notes (Signed)
CSN: [id number]     Arrival date & time 04/14/15  2119 History  This chart was scribed for Everlene Balls, MD by Evelene Croon, ED Scribe. This patient was seen in room B18C/B18C and the patient's care was started 12:36 AM.    Chief Complaint  Patient presents with  . Diarrhea    The history is provided by the patient and the spouse. No language interpreter was used.     HPI Comments:  Joan [name] is a 69 y.o. female with a PMHx of diverticulitis who presents to the Emergency Department complaining of persistent diarrhea for ~ 9 weeks with ~3-4 episodes a day. She reports associated intermittent LLQ abdominal pain an episode of nausea today, weight loss--10 pounds in 2 months and l . She denies blood in her stool and vomiting. No sick contacts at home; no alleviating factors noted. She has ben evaluated by her PCP for her symptoms and was placed on Flagyl.  She has a colonoscopy scheduled for Oct 4th, 2016.   Past Medical History  Diagnosis Date  . Thyroid disease     hypothyroidism  . Impacted cerumen     right ear  . Thyrotoxicosis   . Unspecified vitamin D deficiency   . Nontoxic uninodular goiter   . Other left bundle branch block   . External hemorrhoids without mention of complication   . Alopecia areata 02/16/2006  . Chronic rhinitis 09/01/05  . Pain in joint, site unspecified 03/01/05  . Myalgia and myositis, unspecified 03/01/05  . Rheumatoid arthritis(714.0) 10/05/2004  . Dysphagia, unspecified(787.20) 2004  . Anxiety state, unspecified 2004  . Benign paroxysmal positional vertigo 04/01/03  . 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 Osteoarthrosis, unspecified whether generalized or localized, unspecified site 04/01/03  . Cervicalgia 04/01/03  . Senile osteoporosis 04/01/03  . Hypertension 2004  . Enthesopathy of ankle and tarsus, unspecified 03/31/96  . Foot and toe(s), blister, without mention of infection   . Corns and callosities   . Dermatophytosis of nail   . Other  specified circulatory system disorders   . Encounter for long-term (current) use of other medications   . Nontoxic uninodular goiter   . Thyrotoxicosis without mention of goiter or other cause, without mention of thyrotoxic crisis or storm   . Routine general medical examination at a health care facility   . Lumbago   . Urinary frequency   . Goiter, unspecified   . Anxiety state, unspecified   . Osteoarthrosis, unspecified whether generalized or localized, unspecified site   . Cervicalgia   . Osteoporosis, unspecified   . Disorder of bone and cartilage, unspecified   . Other and unspecified nonspecific immunological findings   . Sprain of metatarsophalangeal (joint) of foot   . Unspecified essential hypertension   . Enthesopathy of ankle and tarsus, unspecified   . Fibromyalgia    Past Surgical History  Procedure Laterality Date  . Tubal ligation  1973  . Tonsillectomy and adenoidectomy  1974  . Right wrist sugery for tenosynovitis  1997    DR SYPHER   . Remove ganglion  1998    DR SYPHER   . Foot surgery Right 05/22/2014    Dr.Strom    Family History  Problem Relation Age of Onset  . Heart attack Father     age 92  . Heart disease Father    Social History  Substance Use Topics  . Smoking status: Never Smoker   . Smokeless tobacco: Never Used  . Alcohol Use:  No   OB History    No data available     Review of Systems  A complete 10 system review of systems was obtained and all systems are negative except as noted in the HPI and PMH.    Allergies  Arava; Neurontin; and Penicillins  Home Medications   Prior to Admission medications   Medication Sig Start Date End Date Taking? Authorizing Provider  acetaminophen (TYLENOL) 500 MG tablet Take 500 mg by mouth every 6 (six) hours as needed for pain. Take two tablets up to four times a day as needed    Historical Provider, MD  amitriptyline (ELAVIL) 10 MG tablet TAKE ONE TABLET BY MOUTH AT BEDTIME TO HELP  RELAX  MUSCLES 07/22/14   Estill Dooms, MD  Calcium Carb-Cholecalciferol (CALCIUM 500 +D PO) Take 500 mg by mouth daily. Take one tablet once a daily for bone health.    Historical Provider, MD  colestipol (COLESTID) 1 G tablet Take 1 tablet (1 g total) by mouth 2 (two) times daily. 04/05/15   Lori P Hvozdovic, PA-C  Diclofenac-Misoprostol 75-0.2 MG TBEC Take 1 tablet twice daily for arthritis. 03/23/15   Estill Dooms, MD  dicyclomine (BENTYL) 20 MG tablet One every 6 hours as needed to control diarrhea. 03/23/15   Estill Dooms, MD  diphenoxylate-atropine (LOMOTIL) 2.5-0.025 MG per tablet Take one tablet by mouth four times daily as needed for diarrhea or loose stools 03/23/15   Estill Dooms, MD  ENBREL SURECLICK 50 MG/ML injection Inject 50 mg as directed once a week. Inject once weekly for arthritis 11/30/12   Historical Provider, MD  EVISTA 60 MG tablet Take one tablet once daily for osteoporosis 11/22/12   Historical Provider, MD  fluticasone (FLONASE) 50 MCG/ACT nasal spray Place 1 spray into the nose daily. 07/22/14   Estill Dooms, MD  folic acid (FOLVITE) 1 MG tablet Take 1 mg by mouth daily. Take two tablets every morning. 10/14/12   Historical Provider, MD  Glucosamine-Chondroit-Vit C-Mn (GLUCOSAMINE CHONDR 500 COMPLEX PO) Take 500 mg by mouth daily. Take 2 tablets daily for joint health.    Historical Provider, MD  hydrochlorothiazide (MICROZIDE) 12.5 MG capsule TAKE ONE CAPSULE BY MOUTH ONCE DAILY FOR BLOOD PRESSURE 03/23/15   Estill Dooms, MD  metroNIDAZOLE (FLAGYL) 250 MG tablet Take 1 tablet (250 mg total) by mouth 3 (three) times daily. 04/05/15   Lori P Hvozdovic, PA-C  MOVIPREP 100 G SOLR Take 1 kit (200 g total) by mouth once. 04/05/15   Lori P Hvozdovic, PA-C  Multiple Vitamin (MULTIVITAMIN) tablet Take 1 tablet by mouth daily. Take 1 tablet once a daily    Historical Provider, MD  mupirocin cream (BACTROBAN) 2 % Apply once daily to the affected area with dressing change 02/19/14   Harriet Masson, DPM  Omega-3 Fatty Acids (FISH OIL) 1000 MG CAPS Take 1,000 mg by mouth daily. Take one tablet once a daily for heart, joint and skin health.    Historical Provider, MD  traMADol (ULTRAM) 50 MG tablet TAKE ONE TABLET BY MOUTH 4 TIMES DAILY AS NEEDED FOR PAIN 03/05/15   Tiffany L Reed, DO  urea (CARMOL) 20 % cream Apply to callus daily 12/09/14   Estill Dooms, MD   BP 136/53 mmHg  Pulse 95  Temp(Src) 101.9 F (38.8 C) (Oral)  Resp 18  Ht _0  (1.422 m)  Wt 102 lb (46.267 kg)  BMI 22.88 kg/m2  SpO2 99% Physical Exam  Constitutional: She is oriented to person, place, and time. She appears well-developed and well-nourished. No distress.  HENT:  Head: Normocephalic and atraumatic.  Nose: Nose normal.  Mouth/Throat: Oropharynx is clear and moist. No oropharyngeal exudate.  Eyes: Conjunctivae and EOM are normal. Pupils are equal, round, and reactive to light. No scleral icterus.  Neck: Normal range of motion. Neck supple. No JVD present. No tracheal deviation present. No thyromegaly present.  Cardiovascular: Normal rate, regular rhythm and normal heart sounds.  Exam reveals no gallop and no friction rub.   No murmur heard. Pulmonary/Chest: Effort normal and breath sounds normal. No respiratory distress. She has no wheezes. She exhibits no tenderness.  Abdominal: Soft. Bowel sounds are normal. She exhibits no distension and no mass. There is tenderness (LLQ). There is no rebound and no guarding.  Musculoskeletal: Normal range of motion. She exhibits no edema or tenderness.  Lymphadenopathy:    She has no cervical adenopathy.  Neurological: She is alert and oriented to person, place, and time. No cranial nerve deficit. She exhibits normal muscle tone.  Skin: Skin is warm and dry. No rash noted. No erythema. No pallor.  Nursing note and vitals reviewed.   ED Course  Procedures   DIAGNOSTIC STUDIES:  Oxygen Saturation is 99% on RA, normal by my interpretation.    COORDINATION OF  CARE:  12:41 AM Will order CT abdomen. Discussed treatment plan with pt at bedside and pt agreed to plan.  Labs Review Labs Reviewed  LIPASE, BLOOD - Abnormal; Notable for the following:    Lipase 18 (*)    All other components within normal limits  COMPREHENSIVE METABOLIC PANEL - Abnormal; Notable for the following:    Sodium 133 (*)    Potassium 2.8 (*)    Chloride 95 (*)    Glucose, Bld 121 (*)    Calcium 8.6 (*)    All other components within normal limits  CBC - Abnormal; Notable for the following:    WBC 18.5 (*)    All other components within normal limits  URINALYSIS, ROUTINE W REFLEX MICROSCOPIC (NOT AT [facility]) - Abnormal; Notable for the following:    APPearance HAZY (*)    Specific Gravity, Urine >1.030 (*)    Hgb urine dipstick LARGE (*)    Bilirubin Urine SMALL (*)    Ketones, ur 40 (*)    Nitrite POSITIVE (*)    Leukocytes, UA SMALL (*)    All other components within normal limits  URINE MICROSCOPIC-ADD ON  MAGNESIUM    Imaging Review Ct Abdomen Pelvis W Contrast  04/15/2015   CLINICAL DATA:  Chronic left lower quadrant abdominal pain and diarrhea. Initial encounter.  EXAM: CT ABDOMEN AND PELVIS WITH CONTRAST  TECHNIQUE: Multidetector CT imaging of the abdomen and pelvis was performed using the standard protocol following bolus administration of intravenous contrast.  CONTRAST:  38m OMNIPAQUE IOHEXOL 300 MG/ML  SOLN  COMPARISON:  CT of the abdomen and pelvis performed 09/11/2011  FINDINGS: The visualized lung bases are clear.  The liver and spleen are unremarkable in appearance. The gallbladder is within normal limits. The pancreas and adrenal glands are unremarkable.  The kidneys are unremarkable in appearance. There is no evidence of hydronephrosis. No renal or ureteral stones are seen. No perinephric stranding is appreciated.  No free fluid is identified. The small bowel is unremarkable in appearance. The stomach is within normal limits. No acute vascular  abnormalities are seen. Scattered calcification is noted along the abdominal aorta.  The  appendix is not well characterized; there is no evidence for appendicitis. Mild mucosal thickening is suggested along the sigmoid colon, which could reflect a mild infectious or inflammatory process.  The bladder is mildly distended and grossly unremarkable. The uterus is unremarkable in appearance. A 4.2 cm cyst is noted at the left ovary. The ovaries are otherwise grossly unremarkable. No inguinal lymphadenopathy is seen.  No acute osseous abnormalities are identified. Vacuum phenomenon is noted at L3-L4, and L5-S1. There is grade 2 anterolisthesis of L5 on S1, reflecting underlying chronic bilateral pars defects at L5.  IMPRESSION: 1. Mild mucosal thickening suggested along the sigmoid colon, which could reflect a mild infectious or inflammatory process. 2. Scattered calcification along the abdominal aorta. 3. 4.2 cm cyst at the left ovary. Pelvic ultrasound could be considered for further evaluation, on an elective nonemergent basis. 4. Degenerative change at the lower lumbar spine. Grade 2 anterolisthesis of L5 on S1, reflecting underlying chronic bilateral pars defects at L5.   Electronically Signed   By: Garald Balding M.D.   On: 04/15/2015 04:51     EKG Interpretation None      MDM   Final diagnoses:  None   Patient presents emergency department for 3 months worth of diarrhea. She has seen a gastroenterologist and has had numerous tests including C. difficile and Giardia which are negative. She has tried Flagyl as well. She continues to have diarrhea and is now becoming dehydrated and weak per her husband. Laboratory studies here reveal white count of 18, potassium 2.8, urinalysis shows possible infection. CT scan shows mild sigmoid colitis. Patient was given Cipro and Flagyl for treatment. Upon arrival patient was febrile to 38.8 as well. Patient is admitted to telemetry, I spoke with Dr. Jabier Mutton for  admission.  I personally performed the services described in this documentation, which was scribed in my presence. The recorded information has been reviewed and is accurate.    Everlene Balls, MD 04/15/15 [phone]

## 2015-04-16 ENCOUNTER — Encounter (HOSPITAL_COMMUNITY): Payer: Self-pay | Admitting: Internal Medicine

## 2015-04-16 DIAGNOSIS — K529 Noninfective gastroenteritis and colitis, unspecified: Principal | ICD-10-CM

## 2015-04-16 LAB — BASIC METABOLIC PANEL
Anion gap: 6 (ref 5–15)
CALCIUM: 7.4 mg/dL — AB (ref 8.9–10.3)
CO2: 25 mmol/L (ref 22–32)
Chloride: 106 mmol/L (ref 101–111)
Creatinine, Ser: 0.53 mg/dL (ref 0.44–1.00)
GFR calc Af Amer: >60 mL/min (ref >60)
GFR calc non Af Amer: >60 mL/min (ref >60)
GLUCOSE: 101 mg/dL — AB (ref 65–99)
Potassium: 3.7 mmol/L (ref 3.5–5.1)
SODIUM: 137 mmol/L (ref 135–145)

## 2015-04-16 LAB — CBC
HEMATOCRIT: 33.3 % — AB (ref 36.0–46.0)
HEMOGLOBIN: 11.1 g/dL — AB (ref 12.0–15.0)
MCH: 31.4 pg (ref 26.0–34.0)
MCHC: 33.3 g/dL (ref 30.0–36.0)
MCV: 94.1 fL (ref 78.0–100.0)
Platelets: 234 10*3/uL (ref 150–400)
RBC: 3.54 MIL/uL — ABNORMAL LOW (ref 3.87–5.11)
RDW: 14.3 % (ref 11.5–15.5)
WBC: 8.2 10*3/uL (ref 4.0–10.5)

## 2015-04-16 LAB — C DIFFICILE QUICK SCREEN W PCR REFLEX
C DIFFICLE (CDIFF) ANTIGEN: NEGATIVE
C Diff interpretation: NEGATIVE
C Diff toxin: NEGATIVE

## 2015-04-16 LAB — TSH: TSH: 1.876 u[IU]/mL (ref 0.350–4.500)

## 2015-04-16 MED ORDER — CIPROFLOXACIN IN D5W 400 MG/200ML IV SOLN
400.0000 mg | Freq: Two times a day (BID) | INTRAVENOUS | Status: DC
  Administered 2015-04-16: 400 mg via INTRAVENOUS
  Filled 2015-04-16 (×2): qty 200

## 2015-04-16 MED ORDER — COLESTIPOL HCL 1 G PO TABS
1.0000 g | ORAL_TABLET | Freq: Two times a day (BID) | ORAL | Status: DC
  Administered 2015-04-16 – 2015-04-17 (×3): 1 g via ORAL
  Filled 2015-04-16 (×4): qty 1

## 2015-04-16 MED ORDER — METRONIDAZOLE IN NACL 5-0.79 MG/ML-% IV SOLN
500.0000 mg | Freq: Three times a day (TID) | INTRAVENOUS | Status: DC
  Administered 2015-04-16 – 2015-04-17 (×3): 500 mg via INTRAVENOUS
  Filled 2015-04-16 (×3): qty 100

## 2015-04-16 NOTE — Progress Notes (Signed)
Patient Demographics:    Joan [name], is a 69 y.o. female, DOB - 1945/12/08, HTD:[id number]  Admit date - 04/15/2015   Admitting Physician Rise Patience, MD  Outpatient Primary MD for the patient is GREEN, Viviann Spare, MD  LOS - 1   Chief Complaint  Patient presents with  . Diarrhea        Subjective:    Joan [name] today has, No headache, No chest pain, No abdominal pain - No Nausea, No new weakness tingling or numbness, No Cough - SOB. Diarrhea has resolved   Assessment  & Plan :    1. Chronic diarrhea. C. difficile negative. Seen by GI, colonoscopy unrevealing, CT scan raised the question of sigmoid colitis. On empiric anti-biotics. Biopsies pending. Continue supportive care for the management per GI for this problem.   2. Hypo-thyroidism. Check TSH as she has diarrhea, continue home dose Synthroid.   3. Rheumatoid arthritis. Home medications continued except for Enbrel, resume in the outpatient setting.   4. Dehydration with hypokalemia. Resolved with IV fluids and potassium replacement.   5. Essential hypertension. Stable off medications monitor.    6. Nonspecific ovarian cyst noted on CT scan. Kindly review the report attached below, she will require a pelvic ultrasound in the next 1-2 months.     Code Status : Full  Family Communication  : None present  Disposition Plan  : Home in 1-2 days  Consults  :  GI  Procedures  :   Colonoscopy - unremarkable  DVT Prophylaxis  :  Lovenox   Lab Results  Component Value Date   PLT 234 04/16/2015    Inpatient Medications  Scheduled Meds: . amitriptyline  10 mg Oral QHS  . ciprofloxacin  400 mg Intravenous BID  . colestipol  1 g Oral BID  . enoxaparin (LOVENOX) injection  40 mg Subcutaneous Q24H  . folic acid  1 mg  Oral Daily  . levothyroxine  25 mcg Oral QAC breakfast  . metronidazole  500 mg Intravenous Q8H  . predniSONE  5 mg Oral Q breakfast  . raloxifene  60 mg Oral Daily  . sodium chloride  3 mL Intravenous Q12H   Continuous Infusions: . 0.9 % NaCl with KCl 40 mEq / L 100 mL/hr (04/16/15 0526)   PRN Meds:.acetaminophen **OR** acetaminophen, albuterol, diphenoxylate-atropine, traMADol  Antibiotics  :     Anti-infectives    Start     Dose/Rate Route Frequency Ordered Stop   04/16/15 1800  ciprofloxacin (CIPRO) IVPB 400 mg     400 mg 200 mL/hr over 60 Minutes Intravenous 2 times daily 04/16/15 1021     04/16/15 1030  metroNIDAZOLE (FLAGYL) IVPB 500 mg     500 mg 100 mL/hr over 60 Minutes Intravenous Every 8 hours 04/16/15 1021     04/15/15 1400  metroNIDAZOLE (FLAGYL) IVPB 500 mg  Status:  Discontinued     500 mg 100 mL/hr over 60 Minutes Intravenous Every 8 hours 04/15/15 0820 04/16/15 1018   04/15/15 0830  ciprofloxacin (CIPRO) IVPB 400 mg  Status:  Discontinued     400 mg 200 mL/hr over 60 Minutes Intravenous 2 times daily 04/15/15 0820 04/16/15 1018   04/15/15 0500  ciprofloxacin (CIPRO) IVPB 400 mg  400 mg 200 mL/hr over 60 Minutes Intravenous  Once 04/15/15 0456 04/15/15 0720   04/15/15 0500  metroNIDAZOLE (FLAGYL) IVPB 500 mg     500 mg 100 mL/hr over 60 Minutes Intravenous  Once 04/15/15 0456 04/15/15 0720        Objective:   Filed Vitals:   04/15/15 1620 04/15/15 2205 04/16/15 0537 04/16/15 0542  BP: 135/54 131/65  139/66  Pulse: 86 105  87  Temp:  100 F (37.8 C)  97.5 F (36.4 C)  TempSrc:  Oral  Oral  Resp: 20 18  20   Height:      Weight:   44.18 kg (97 lb 6.4 oz)   SpO2: 98% 100%  97%    Wt Readings from Last 3 Encounters:  04/16/15 44.18 kg (97 lb 6.4 oz)  04/05/15 47.231 kg (104 lb 2 oz)  03/23/15 49.714 kg (109 lb 9.6 oz)     Intake/Output Summary (Last 24 hours) at 04/16/15 1250 Last data filed at 04/16/15 0931  Gross per 24 hour  Intake  1736.67 ml  Output   1025 ml  Net 711.67 ml     Physical Exam  Awake Alert, Oriented X 3, No new F.N deficits, Normal affect [place].AT,PERRAL Supple Neck,No JVD, No cervical lymphadenopathy appriciated.  Symmetrical Chest wall movement, Good air movement bilaterally, CTAB RRR,No Gallops,Rubs or new Murmurs, No Parasternal Heave +ve B.Sounds, Abd Soft, No tenderness, No organomegaly appriciated, No rebound - guarding or rigidity. No Cyanosis, Clubbing or edema, No new Rash or bruise       Data Review:   Micro Results Recent Results (from the past 240 hour(s))  Clostridium Difficile by PCR (not at [facility])     Status: None   Collection Time: 04/07/15  7:50 AM  Result Value Ref Range Status   Toxigenic C Difficile by pcr Not Detected Not Detected Final    Comment: This test is for use only with liquid or soft stools; performance characteristics of other clinical specimen types have not been established.   This assay was performed by Cepheid GeneXpert(R) PCR. The performance characteristics of this assay have been determined by [employer]. Performance characteristics refer to the analytical performance of the test.   Giardia/cryptosporidium (EIA)     Status: None   Collection Time: 04/07/15  7:50 AM  Result Value Ref Range Status   Giardia Screen (EIA) NEGATIVE  Final   Cryptosporidium Screen (EIA) NEGATIVE  Final  C difficile quick scan w PCR reflex     Status: None   Collection Time: 04/16/15 11:20 AM  Result Value Ref Range Status   C Diff antigen NEGATIVE NEGATIVE Final   C Diff toxin NEGATIVE NEGATIVE Final   C Diff interpretation Negative for toxigenic C. difficile  Final    Radiology Reports Ct Abdomen Pelvis W Contrast  04/15/2015   CLINICAL DATA:  Chronic left lower quadrant abdominal pain and diarrhea. Initial encounter.  EXAM: CT ABDOMEN AND PELVIS WITH CONTRAST  TECHNIQUE: Multidetector CT imaging of the abdomen and pelvis was performed using the standard  protocol following bolus administration of intravenous contrast.  CONTRAST:  89mL OMNIPAQUE IOHEXOL 300 MG/ML  SOLN  COMPARISON:  CT of the abdomen and pelvis performed 09/11/2011  FINDINGS: The visualized lung bases are clear.  The liver and spleen are unremarkable in appearance. The gallbladder is within normal limits. The pancreas and adrenal glands are unremarkable.  The kidneys are unremarkable in appearance. There is no evidence of hydronephrosis. No renal  or ureteral stones are seen. No perinephric stranding is appreciated.  No free fluid is identified. The small bowel is unremarkable in appearance. The stomach is within normal limits. No acute vascular abnormalities are seen. Scattered calcification is noted along the abdominal aorta.  The appendix is not well characterized; there is no evidence for appendicitis. Mild mucosal thickening is suggested along the sigmoid colon, which could reflect a mild infectious or inflammatory process.  The bladder is mildly distended and grossly unremarkable. The uterus is unremarkable in appearance. A 4.2 cm cyst is noted at the left ovary. The ovaries are otherwise grossly unremarkable. No inguinal lymphadenopathy is seen.  No acute osseous abnormalities are identified. Vacuum phenomenon is noted at L3-L4, and L5-S1. There is grade 2 anterolisthesis of L5 on S1, reflecting underlying chronic bilateral pars defects at L5.  IMPRESSION: 1. Mild mucosal thickening suggested along the sigmoid colon, which could reflect a mild infectious or inflammatory process. 2. Scattered calcification along the abdominal aorta. 3. 4.2 cm cyst at the left ovary. Pelvic ultrasound could be considered for further evaluation, on an elective nonemergent basis. 4. Degenerative change at the lower lumbar spine. Grade 2 anterolisthesis of L5 on S1, reflecting underlying chronic bilateral pars defects at L5.   Electronically Signed   By: Garald Balding M.D.   On: 04/15/2015 04:51      CBC  Recent Labs Lab 04/14/15 2137 04/16/15 0640  WBC 18.5* 8.2  HGB 13.7 11.1*  HCT 40.3 33.3*  PLT 323 234  MCV 91.4 94.1  MCH 31.1 31.4  MCHC 34.0 33.3  RDW 13.5 14.3    Chemistries   Recent Labs Lab 04/14/15 2137 04/15/15 0120 04/15/15 0827 04/16/15 0640  NA 133*  --  138 137  K 2.8*  --  3.6 3.7  CL 95*  --  105 106  CO2 26  --  25 25  GLUCOSE 121*  --  93 101*  BUN 17  --  8 <5*  CREATININE 0.75  --  0.61 0.53  CALCIUM 8.6*  --  7.8* 7.4*  MG  --  1.9  --   --   AST 25  --   --   --   ALT 19  --   --   --   ALKPHOS 49  --   --   --   BILITOT 0.5  --   --   --    ------------------------------------------------------------------------------------------------------------------ estimated creatinine clearance is 42.6 mL/min (by C-G formula based on Cr of 0.53). ------------------------------------------------------------------------------------------------------------------ No results for input(s): HGBA1C in the last 72 hours. ------------------------------------------------------------------------------------------------------------------ No results for input(s): CHOL, HDL, LDLCALC, TRIG, CHOLHDL, LDLDIRECT in the last 72 hours. ------------------------------------------------------------------------------------------------------------------ No results for input(s): TSH, T4TOTAL, T3FREE, THYROIDAB in the last 72 hours.  Invalid input(s): FREET3 ------------------------------------------------------------------------------------------------------------------ No results for input(s): VITAMINB12, FOLATE, FERRITIN, TIBC, IRON, RETICCTPCT in the last 72 hours.  Coagulation profile No results for input(s): INR, PROTIME in the last 168 hours.  No results for input(s): DDIMER in the last 72 hours.  Cardiac Enzymes No results for input(s): CKMB, TROPONINI, MYOGLOBIN in the last 168 hours.  Invalid input(s):  CK ------------------------------------------------------------------------------------------------------------------ Invalid input(s): POCBNP   Time Spent in minutes 35   SINGH,PRASHANT K M.D on 04/16/2015 at 12:50 PM  Between 7am to 7pm - Pager - [phone]  After 7pm go to [email or web address] - password [email or web address]  [facility] -  Office  [phone]

## 2015-04-16 NOTE — Progress Notes (Signed)
   Patient Name: Joan [name] Date of Encounter: 04/16/2015, 10:50 AM    Subjective  Better - no diarrhea since colonoscopy   Objective  BP 139/66 mmHg  Pulse 87  Temp(Src) 97.5 F (36.4 C) (Oral)  Resp 20  Ht 4\' 8"  (1.422 m)  Wt 97 lb 6.4 oz (44.18 kg)  BMI 21.85 kg/m2  SpO2 97% NAD - eating solid food    Assessment and Plan  Chronic diarrhea in RA patient on biologics and prednisone ? cause   Await colon bxs - hopefully out today  Gatha Mayer, MD, [facility] [phone] (pager) 04/16/2015 10:50 AM

## 2015-04-16 NOTE — Care Management Note (Signed)
Case Management Note  Patient Details  Name: IKRAM RIEBE MRN: [id number] Date of Birth: 1946-08-28  Subjective/Objective:    Patient is from home with spouse, pta indep.  NCM will cont to follow for dc needs.               Action/Plan:   Expected Discharge Date:                  Expected Discharge Plan:  Home/Self Care  In-House Referral:     Discharge planning Services  CM Consult  Post Acute Care Choice:    Choice offered to:     DME Arranged:    DME Agency:     HH Arranged:    HH Agency:     Status of Service:  In process, will continue to follow  Medicare Important Message Given:    Date Medicare IM Given:    Medicare IM give by:    Date Additional Medicare IM Given:    Additional Medicare Important Message give by:     If discussed at [place] of Stay Meetings, dates discussed:    Additional Comments:  Zenon Mayo, RN 04/16/2015, 1:48 PM

## 2015-04-17 MED ORDER — METRONIDAZOLE 500 MG PO TABS
500.0000 mg | ORAL_TABLET | Freq: Three times a day (TID) | ORAL | Status: DC
  Administered 2015-04-17: 500 mg via ORAL
  Filled 2015-04-17: qty 1

## 2015-04-17 MED ORDER — METRONIDAZOLE 500 MG PO TABS: 500.0000 mg | ORAL_TABLET | Freq: Three times a day (TID) | ORAL | Status: DC

## 2015-04-17 MED ORDER — CIPROFLOXACIN HCL 500 MG PO TABS: 500.0000 mg | ORAL_TABLET | Freq: Two times a day (BID) | ORAL | Status: DC

## 2015-04-17 MED ORDER — CIPROFLOXACIN HCL 500 MG PO TABS
500.0000 mg | ORAL_TABLET | Freq: Two times a day (BID) | ORAL | Status: DC
  Administered 2015-04-17: 500 mg via ORAL
  Filled 2015-04-17: qty 1

## 2015-04-17 MED ORDER — COLESTIPOL HCL 1 G PO TABS: 1.0000 g | ORAL_TABLET | Freq: Two times a day (BID) | ORAL | Status: DC

## 2015-04-17 NOTE — Progress Notes (Signed)
Patient discharge teaching given, including activity, diet, follow-up appoints, and medications. Patient verbalized understanding of all discharge instructions. IV access was d/c'd. Vitals are stable. Skin is intact except as charted in most recent assessments. Pt to be escorted out by NT, to be driven home by family. 

## 2015-04-17 NOTE — Discharge Instructions (Signed)
Follow with Primary MD GREEN, Viviann Spare, MD in 7 days   Get CBC, CMP, 2 view Chest X ray checked  by Primary MD next visit.    Activity: As tolerated with Full fall precautions use walker/cane & assistance as needed   Disposition Home     Diet: Heart Healthy Dairy Free.  For Heart failure patients - Check your Weight same time everyday, if you gain over 2 pounds, or you develop in leg swelling, experience more shortness of breath or chest pain, call your Primary MD immediately. Follow Cardiac Low Salt Diet and 1.5 lit/day fluid restriction.   On your next visit with your primary care physician please Get Medicines reviewed and adjusted.   Please request your Prim.MD to go over all Hospital Tests and Procedure/Radiological results at the follow up, please get all Hospital records sent to your Prim MD by signing hospital release before you go home.   If you experience worsening of your admission symptoms, develop shortness of breath, life threatening emergency, suicidal or homicidal thoughts you must seek medical attention immediately by calling 911 or calling your MD immediately  if symptoms less severe.  You Must read complete instructions/literature along with all the possible adverse reactions/side effects for all the Medicines you take and that have been prescribed to you. Take any new Medicines after you have completely understood and accpet all the possible adverse reactions/side effects.   Do not drive, operating heavy machinery, perform activities at heights, swimming or participation in water activities or provide baby sitting services if your were admitted for syncope or siezures until you have seen by Primary MD or a Neurologist and advised to do so again.  Do not drive when taking Pain medications.    Do not take more than prescribed Pain, Sleep and Anxiety Medications  Special Instructions: If you have smoked or chewed Tobacco  in the last 2 yrs please stop smoking, stop  any regular Alcohol  and or any Recreational drug use.  Wear Seat belts while driving.   Please note  You were cared for by a hospitalist during your hospital stay. If you have any questions about your discharge medications or the care you received while you were in the hospital after you are discharged, you can call the unit and asked to speak with the hospitalist on call if the hospitalist that took care of you is not available. Once you are discharged, your primary care physician will handle any further medical issues. Please note that NO REFILLS for any discharge medications will be authorized once you are discharged, as it is imperative that you return to your primary care physician (or establish a relationship with a primary care physician if you do not have one) for your aftercare needs so that they can reassess your need for medications and monitor your lab values.

## 2015-04-17 NOTE — Discharge Summary (Signed)
Joan [name], is a 69 y.o. female  DOB 05/27/1946  MRN [id number].  Admission date:  04/15/2015  Admitting Physician  Rise Patience, MD  Discharge Date:  04/17/2015   Primary MD  Estill Dooms, MD  Recommendations for primary care physician for things to follow:   Follow-up: Biopsy results, CBC, BMP in the next 2-3 days.  He must follow with Dr. Carlean Purl in the next 2-3 days for her chronic diarrhea and colonoscopy follow-up.  She will require a pelvic ultrasound in the next 1-2 months.    Admission Diagnosis  Diarrhea [R19.7] Dehydration [E86.0] Colitis [K52.9] LLQ abdominal pain [R10.32]   Discharge Diagnosis  Diarrhea [R19.7] Dehydration [E86.0] Colitis [K52.9] LLQ abdominal pain [R10.32]     Principal Problem:   Colitis Active Problems:   Hypertension   Diarrhea   Hypothyroidism   Dehydration   Hypokalemia      Past Medical History  Diagnosis Date  . Hypothyroidism   . Impacted cerumen     right ear  . Thyrotoxicosis   . Unspecified vitamin D deficiency   . Nontoxic uninodular goiter   . Other left bundle branch block   . External hemorrhoids without mention of complication   . Alopecia areata 02/16/2006  . Chronic rhinitis 09/01/05  . Myalgia and myositis, unspecified 03/01/05  . Rheumatoid arthritis(714.0) 10/05/2004    lumbar spondylosis. Lumbo-sacral anterolisthesis    . Dysphagia, unspecified(787.20) 2004  . Anxiety state, unspecified 2004  . Benign paroxysmal positional vertigo 04/01/03  . 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 Osteoarthrosis, unspecified whether generalized or localized, unspecified site 04/01/03  . Cervicalgia 04/01/03  . Senile osteoporosis 04/01/03  . Hypertension 2004  . Enthesopathy of ankle and tarsus, unspecified 03/31/96  . Corns and callosities   .  Dermatophytosis of nail   . Thyrotoxicosis without mention of goiter or other cause, without mention of thyrotoxic crisis or storm   . Lumbago   . Urinary frequency   . Sprain of metatarsophalangeal (joint) of foot   . Fibromyalgia   . Diarrhea 04/15/2015    Past Surgical History  Procedure Laterality Date  . Tubal ligation  1973  . Tonsillectomy and adenoidectomy  1974  . Right wrist sugery for tenosynovitis  1997    DR SYPHER   . Remove ganglion  1998    DR SYPHER   . Foot surgery Right 05/22/2014    Dr.Strom   . Colonoscopy    . Colonoscopy N/A 04/15/2015    Procedure: COLONOSCOPY;  Surgeon: Gatha Mayer, MD;  Location: [place];  Service: Endoscopy;  Laterality: N/A;       HPI  from the history and physical done on the day of admission:   GISSELE [name] is a 69 y.o. female with PMH of hypothyroid, vitamin D deficiency, LBBB, RA on chronic prednisone, HTN, ongoing chronic diarrhea and abdominal pain of 9 weeks duration, presented to [facility] ED on 04/14/15 with complaints of persistent diarrhea, abdominal pain, generalized weakness. She states that for  the last 9 weeks she has had persistent watery diarrhea, varying frequency but up to 4 times daily at least, no blood or mucus in stools, associated with mostly left lower quadrant abdominal pain-moderate in intensity and intermittent, decreased appetite, 10 pound weight loss, no fever or chills, no nausea or vomiting. She was seen by her gastroenterologist on 8/1, stool workup was negative for C. difficile toxin, Giardia and cryptosporidium. She was started on Flagyl and despite being on it for 1-1/2 weeks has not seen significant relief. She thereby presented to the ED where fever off 101.9, lab work significant for sodium 133, potassium 2.8, WBC 18.5 and CT abdomen shows findings suggestive of sigmoid colitis. She has received a dose of IV Cipro, Flagyl, oral and IV potassium. Hospitalist admission was requested.     Hospital  Course:    1. Chronic diarrhea. C. difficile negative. Seen by GI, colonoscopy unrevealing, CT scan raised the question of sigmoid colitis. Was started on empiric Cipro and Flagyl by GI which will be continued for another 5 days, colon biopsies are pending. Could have microscopic colitis. Her diarrhea overall is much improved and she is currently symptom-free. Had 2-3 bowel movements in the last 36 hours. Was seen today by Dr. Carol Ada [facility] was cleared for discharge on 5 more days of oral Cipro and Flagyl with outpatient follow-up with Dr. Carlean Purl on follow-up on her colon biopsy results. Request PCP to check CBC CMP next visit.    2. Hypo-thyroidism. Stable TSH, continue home dose Synthroid.   3. Rheumatoid arthritis. Home medications continued except for Enbrel, resume in the outpatient setting.   4. Dehydration with hypokalemia. Resolved with IV fluids and potassium replacement.   5. Essential hypertension. Stable , continue home meds.    6. Nonspecific ovarian cyst noted on CT scan. Kindly review the report attached below, she will require a pelvic ultrasound in the next 1-2 months.     Discharge Condition: Stable  Follow UP  Follow-up Information    Follow up with GREEN, Viviann Spare, MD. Schedule an appointment as soon as possible for a visit in 1 week.   Specialty:  Internal Medicine   Contact information:   [place] [address] [phone]       Follow up with Silvano Rusk, MD. Schedule an appointment as soon as possible for a visit in 3 days.   Specialty:  Gastroenterology   Why:  Biopsy results and follow-up for diarrhea   Contact information:   [address] [place] [address] [phone]        Consults obtained - GI  Diet and Activity recommendation: See Discharge Instructions below  Discharge Instructions       Discharge Instructions    Discharge instructions    Complete by:  As directed   Follow with Primary MD GREEN,  Viviann Spare, MD in 7 days   Get CBC, CMP, 2 view Chest X ray checked  by Primary MD next visit.    Activity: As tolerated with Full fall precautions use walker/cane & assistance as needed   Disposition Home     Diet: Heart Healthy Dairy Free.  For Heart failure patients - Check your Weight same time everyday, if you gain over 2 pounds, or you develop in leg swelling, experience more shortness of breath or chest pain, call your Primary MD immediately. Follow Cardiac Low Salt Diet and 1.5 lit/day fluid restriction.   On your next visit with your primary care  physician please Get Medicines reviewed and adjusted.   Please request your Prim.MD to go over all Hospital Tests and Procedure/Radiological results at the follow up, please get all Hospital records sent to your Prim MD by signing hospital release before you go home.   If you experience worsening of your admission symptoms, develop shortness of breath, life threatening emergency, suicidal or homicidal thoughts you must seek medical attention immediately by calling 911 or calling your MD immediately  if symptoms less severe.  You Must read complete instructions/literature along with all the possible adverse reactions/side effects for all the Medicines you take and that have been prescribed to you. Take any new Medicines after you have completely understood and accpet all the possible adverse reactions/side effects.   Do not drive, operating heavy machinery, perform activities at heights, swimming or participation in water activities or provide baby sitting services if your were admitted for syncope or siezures until you have seen by Primary MD or a Neurologist and advised to do so again.  Do not drive when taking Pain medications.    Do not take more than prescribed Pain, Sleep and Anxiety Medications  Special Instructions: If you have smoked or chewed Tobacco  in the last 2 yrs please stop smoking, stop any regular Alcohol  and or any  Recreational drug use.  Wear Seat belts while driving.   Please note  You were cared for by a hospitalist during your hospital stay. If you have any questions about your discharge medications or the care you received while you were in the hospital after you are discharged, you can call the unit and asked to speak with the hospitalist on call if the hospitalist that took care of you is not available. Once you are discharged, your primary care physician will handle any further medical issues. Please note that NO REFILLS for any discharge medications will be authorized once you are discharged, as it is imperative that you return to your primary care physician (or establish a relationship with a primary care physician if you do not have one) for your aftercare needs so that they can reassess your need for medications and monitor your lab values.     Increase activity slowly    Complete by:  As directed              Discharge Medications       Medication List    TAKE these medications        amitriptyline 10 MG tablet  Commonly known as:  ELAVIL  TAKE ONE TABLET BY MOUTH AT BEDTIME TO HELP  RELAX MUSCLES     CALCIUM PO  Take 1 tablet by mouth daily.     ciprofloxacin 500 MG tablet  Commonly known as:  CIPRO  Take 1 tablet (500 mg total) by mouth 2 (two) times daily.     colestipol 1 G tablet  Commonly known as:  COLESTID  Take 1 tablet (1 g total) by mouth 2 (two) times daily.     Diclofenac-Misoprostol 75-0.2 MG Tbec  Take 1 tablet twice daily for arthritis.     dicyclomine 20 MG tablet  Commonly known as:  BENTYL  One every 6 hours as needed to control diarrhea.     diphenoxylate-atropine 2.5-0.025 MG per tablet  Commonly known as:  LOMOTIL  Take one tablet by mouth four times daily as needed for diarrhea or loose stools     ENBREL SURECLICK 50 MG/ML injection  Generic drug:  etanercept  Inject 50 mg as directed once a week. Inject once weekly for arthritis      EVISTA 60 MG tablet  Generic drug:  raloxifene  Take one tablet once daily for osteoporosis     Fish Oil 1000 MG Caps  Take 1,000 mg by mouth daily. Take one tablet once a daily for heart, joint and skin health.     fluticasone 50 MCG/ACT nasal spray  Commonly known as:  FLONASE  Place 1 spray into the nose daily.     folic acid 1 MG tablet  Commonly known as:  FOLVITE  Take 1 mg by mouth daily. Take two tablets every morning.     GLUCOSAMINE CHONDR 500 COMPLEX PO  Take 500 mg by mouth daily. Take 2 tablets daily for joint health.     hydrochlorothiazide 12.5 MG capsule  Commonly known as:  MICROZIDE  TAKE ONE CAPSULE BY MOUTH ONCE DAILY FOR BLOOD PRESSURE     levothyroxine 25 MCG tablet  Commonly known as:  SYNTHROID, LEVOTHROID  Take 25 mcg by mouth daily before breakfast.     metroNIDAZOLE 500 MG tablet  Commonly known as:  FLAGYL  Take 1 tablet (500 mg total) by mouth every 8 (eight) hours.     MOVIPREP 100 G Solr  Generic drug:  peg 3350 powder  Take 1 kit (200 g total) by mouth once.     multivitamin tablet  Take 1 tablet by mouth daily. Take 1 tablet once a daily     mupirocin cream 2 %  Commonly known as:  BACTROBAN  Apply once daily to the affected area with dressing change     predniSONE 5 MG tablet  Commonly known as:  DELTASONE  Take 5 mg by mouth daily with breakfast.     traMADol 50 MG tablet  Commonly known as:  ULTRAM  TAKE ONE TABLET BY MOUTH 4 TIMES DAILY AS NEEDED FOR PAIN     TYLENOL 500 MG tablet  Generic drug:  acetaminophen  Take 500 mg by mouth every 6 (six) hours as needed for pain. Take two tablets up to four times a day as needed     urea 20 % cream  Commonly known as:  CARMOL  Apply to callus daily     VITAMIN D PO  Take 1 tablet by mouth daily.        Major procedures and Radiology Reports - PLEASE review detailed and final reports for all details, in brief -   Colonoscopy -   ENDOSCOPIC IMPRESSION: 1. There was severe  diverticulosis noted in the sigmoid colon 2. The colonic mucosa appeared normal throughout the entire examined colon; multiple random biopsies were performed using cold forceps  RECOMMENDATIONS: Await pathology results  eSigned: Gatha Mayer, MD, [facility] 04/15/2015 4:28 PM   Ct Abdomen Pelvis W Contrast  04/15/2015   CLINICAL DATA:  Chronic left lower quadrant abdominal pain and diarrhea. Initial encounter.  EXAM: CT ABDOMEN AND PELVIS WITH CONTRAST  TECHNIQUE: Multidetector CT imaging of the abdomen and pelvis was performed using the standard protocol following bolus administration of intravenous contrast.  CONTRAST:  14m OMNIPAQUE IOHEXOL 300 MG/ML  SOLN  COMPARISON:  CT of the abdomen and pelvis performed 09/11/2011  FINDINGS: The visualized lung bases are clear.  The liver and spleen are unremarkable in appearance. The gallbladder is within normal limits. The pancreas and adrenal glands are unremarkable.  The kidneys are unremarkable in appearance. There is no evidence of hydronephrosis. No renal  or ureteral stones are seen. No perinephric stranding is appreciated.  No free fluid is identified. The small bowel is unremarkable in appearance. The stomach is within normal limits. No acute vascular abnormalities are seen. Scattered calcification is noted along the abdominal aorta.  The appendix is not well characterized; there is no evidence for appendicitis. Mild mucosal thickening is suggested along the sigmoid colon, which could reflect a mild infectious or inflammatory process.  The bladder is mildly distended and grossly unremarkable. The uterus is unremarkable in appearance. A 4.2 cm cyst is noted at the left ovary. The ovaries are otherwise grossly unremarkable. No inguinal lymphadenopathy is seen.  No acute osseous abnormalities are identified. Vacuum phenomenon is noted at L3-L4, and L5-S1. There is grade 2 anterolisthesis of L5 on S1, reflecting underlying chronic bilateral pars defects at  L5.  IMPRESSION: 1. Mild mucosal thickening suggested along the sigmoid colon, which could reflect a mild infectious or inflammatory process. 2. Scattered calcification along the abdominal aorta. 3. 4.2 cm cyst at the left ovary. Pelvic ultrasound could be considered for further evaluation, on an elective nonemergent basis. 4. Degenerative change at the lower lumbar spine. Grade 2 anterolisthesis of L5 on S1, reflecting underlying chronic bilateral pars defects at L5.   Electronically Signed   By: Garald Balding M.D.   On: 04/15/2015 04:51    Micro Results      Recent Results (from the past 240 hour(s))  C difficile quick scan w PCR reflex     Status: None   Collection Time: 04/16/15 11:20 AM  Result Value Ref Range Status   C Diff antigen NEGATIVE NEGATIVE Final   C Diff toxin NEGATIVE NEGATIVE Final   C Diff interpretation Negative for toxigenic C. difficile  Final       Today   Subjective    Vielka Dahlen today has no headache,no chest abdominal pain,no new weakness tingling or numbness, feels much better wants to go home today.     Objective   Blood pressure 154/95, pulse 77, temperature 98.4 F (36.9 C), temperature source Oral, resp. rate 20, height _0  (1.422 m), weight 46.5 kg (102 lb 8.2 oz), SpO2 100 %.   Intake/Output Summary (Last 24 hours) at 04/17/15 1046 Last data filed at 04/17/15 0656  Gross per 24 hour  Intake   1280 ml  Output   1300 ml  Net    -20 ml    Exam Awake Alert, Oriented x 3, No new F.N deficits, Normal affect [place].AT,PERRAL Supple Neck,No JVD, No cervical lymphadenopathy appriciated.  Symmetrical Chest wall movement, Good air movement bilaterally, CTAB RRR,No Gallops,Rubs or new Murmurs, No Parasternal Heave +ve B.Sounds, Abd Soft, Non tender, No organomegaly appriciated, No rebound -guarding or rigidity. No Cyanosis, Clubbing or edema, No new Rash or bruise   Data Review   CBC w Diff: Lab Results  Component Value Date   WBC 8.2  04/16/2015   WBC 8.8 11/30/2014   HGB 11.1* 04/16/2015   HCT 33.3* 04/16/2015   PLT 234 04/16/2015   LYMPHOPCT 23.8 04/05/2015   MONOPCT 10.8 04/05/2015   EOSPCT 0.4 04/05/2015   BASOPCT 0.4 04/05/2015    CMP: Lab Results  Component Value Date   NA 137 04/16/2015   NA 142 11/30/2014   K 3.7 04/16/2015   CL 106 04/16/2015   CO2 25 04/16/2015   BUN <5* 04/16/2015   BUN 23 11/30/2014   CREATININE 0.53 04/16/2015   PROT 6.7 04/14/2015   PROT 6.8  11/30/2014   ALBUMIN 3.6 04/14/2015   BILITOT 0.5 04/14/2015   BILITOT 0.2 11/30/2014   ALKPHOS 49 04/14/2015   AST 25 04/14/2015   ALT 19 04/14/2015  .  Lab Results  Component Value Date   TSH 1.876 04/16/2015    Total Time in preparing paper work, data evaluation and todays exam - 35 minutes  Thurnell Lose M.D on 04/17/2015 at 10:46 AM  [facility]   Office  [phone]

## 2015-04-19 ENCOUNTER — Telehealth: Payer: Self-pay | Admitting: Internal Medicine

## 2015-04-19 NOTE — Progress Notes (Signed)
Quick Note:  Biopsies do show colon inflammation - cause not clear but it is not Crohn's or ulcerative colitis  Could be infection. Is not typical for microscopic colitis.  Please let her know and get a symptom update for me and I will advise   ______

## 2015-04-19 NOTE — Telephone Encounter (Signed)
Pt states she was told to call and schedule an OV with Dr. Carlean Purl within 3 days for followup to discuss colon results. Does pt need to be seen that soon? Please advise.

## 2015-04-19 NOTE — Telephone Encounter (Signed)
Left message for pt to call back.  Spoke with pt and she is aware. 

## 2015-04-19 NOTE — Telephone Encounter (Signed)
No - I will call her when we see the pathology and make recommendations Pathology results should be in today or tomorrow  She got standard hospital dc recommendations

## 2015-04-21 ENCOUNTER — Ambulatory Visit (INDEPENDENT_AMBULATORY_CARE_PROVIDER_SITE_OTHER): Payer: Medicare Other | Admitting: Internal Medicine

## 2015-04-21 ENCOUNTER — Encounter: Payer: Self-pay | Admitting: Internal Medicine

## 2015-04-21 VITALS — BP 146/88 | HR 80 | Temp 98.3°F | Ht <= 58 in | Wt 103.6 lb

## 2015-04-21 DIAGNOSIS — I10 Essential (primary) hypertension: Secondary | ICD-10-CM

## 2015-04-21 DIAGNOSIS — E039 Hypothyroidism, unspecified: Secondary | ICD-10-CM | POA: Diagnosis not present

## 2015-04-21 DIAGNOSIS — L84 Corns and callosities: Secondary | ICD-10-CM | POA: Insufficient documentation

## 2015-04-21 DIAGNOSIS — R197 Diarrhea, unspecified: Secondary | ICD-10-CM | POA: Diagnosis not present

## 2015-04-21 DIAGNOSIS — B379 Candidiasis, unspecified: Secondary | ICD-10-CM | POA: Diagnosis not present

## 2015-04-21 DIAGNOSIS — E876 Hypokalemia: Secondary | ICD-10-CM

## 2015-04-21 MED ORDER — FLUCONAZOLE 150 MG PO TABS: 150.0000 mg | ORAL_TABLET | Freq: Once | ORAL | Status: DC

## 2015-04-21 NOTE — Progress Notes (Signed)
Patient ID: Joan [name], female   DOB: 26-Jun-1946, 69 y.o.   MRN: [id number]    Facility  [place]    Place of Service:   OFFICE    Allergies  Allergen Reactions  . Arava [Leflunomide] Diarrhea, Nausea Only and Other (See Comments)    Loss appetite  . Methotrexate Derivatives     Hair loss  . Neurontin [Gabapentin] Nausea And Vomiting  . Penicillins Rash    Chief Complaint  Patient presents with  . Medical Management of Chronic Issues    Medical Management of Chronic issues. 4 Month follow up. Was in [facility] 8/11 for Diarrhea.    HPI:  Office visit for follow-up posthospitalization 04/15/2015 through 04/17/2015. Hospitalized for diarrhea and abdominal discomfort of 9 weeks duration. She had been seen by gastroenterologist a couple weeks prior to the hospitalization. At the time of admission her temperature was 101.9. Electrolytes were disturbed with a sodium of 133 and potassium 2.8. White blood cell count was elevated at 18,500. CT of the abdomen done while she was in the emergency room showed findings suggestive sigmoid colitis. Seen by gastroenterologist, Dr. Henrene Pastor. Patient was put on Florastor, colestipol, Lomotil, Bentyl, and then metronidazole and Cipro. Colonoscopy was done 1116 by Dr. Henrene Pastor. C. difficile testing was negative. Stool was negative for Giardia and cryptosporidia. Salmonella Campylobacter, Campylobacter, Escherichia coli, and Shigella were also all negative.  Hypothyroidism, unspecified hypothyroidism type: Continues to be followed by endocrinologist. She was taken off her low-dose levothyroxin of 25 g when her diarrhea started. She continues to see Dr. Elyse Hsu.  Essential hypertension: Controlled  Monilia infection - following the multiple antibiotics, she has developed a vaginal itch and slight discharge.    Medications: Patient's Medications  New Prescriptions   No medications on file  Previous Medications   ACETAMINOPHEN (TYLENOL) 500 MG TABLET     Take 500 mg by mouth every 6 (six) hours as needed for pain. Take two tablets up to four times a day as needed   AMITRIPTYLINE (ELAVIL) 10 MG TABLET    TAKE ONE TABLET BY MOUTH AT BEDTIME TO HELP  RELAX MUSCLES   CIPROFLOXACIN (CIPRO) 500 MG TABLET    Take 1 tablet (500 mg total) by mouth 2 (two) times daily.   COLESTIPOL (COLESTID) 1 G TABLET    Take 1 tablet (1 g total) by mouth 2 (two) times daily.   DICLOFENAC-MISOPROSTOL 75-0.2 MG TBEC    Take 1 tablet twice daily for arthritis.   DICYCLOMINE (BENTYL) 20 MG TABLET    One every 6 hours as needed to control diarrhea.   DIPHENOXYLATE-ATROPINE (LOMOTIL) 2.5-0.025 MG PER TABLET    Take one tablet by mouth four times daily as needed for diarrhea or loose stools   ENBREL SURECLICK 50 MG/ML INJECTION    Inject 50 mg as directed once a week. Inject once weekly for arthritis   EVISTA 60 MG TABLET    Take one tablet once daily for osteoporosis   FLUTICASONE (FLONASE) 50 MCG/ACT NASAL SPRAY    Place 1 spray into the nose daily.   FOLIC ACID (FOLVITE) 1 MG TABLET    Take 1 mg by mouth daily. Take two tablets every morning.   GLUCOSAMINE-CHONDROIT-VIT C-MN (GLUCOSAMINE CHONDR 500 COMPLEX PO)    Take 500 mg by mouth daily. Take 2 tablets daily for joint health.   HYDROCHLOROTHIAZIDE (MICROZIDE) 12.5 MG CAPSULE    TAKE ONE CAPSULE BY MOUTH ONCE DAILY FOR BLOOD PRESSURE   METRONIDAZOLE (FLAGYL) 500 MG  TABLET    Take 1 tablet (500 mg total) by mouth every 8 (eight) hours.   MULTIPLE VITAMIN (MULTIVITAMIN) TABLET    Take 1 tablet by mouth daily. Take 1 tablet once a daily   MUPIROCIN CREAM (BACTROBAN) 2 %    Apply once daily to the affected area with dressing change   OMEGA-3 FATTY ACIDS (FISH OIL) 1000 MG CAPS    Take 1,000 mg by mouth daily. Take one tablet once a daily for heart, joint and skin health.   PREDNISONE (DELTASONE) 5 MG TABLET    Take 5 mg by mouth daily with breakfast.   SACCHAROMYCES BOULARDII (FLORASTOR) 250 MG CAPSULE    Take one tablet by  mouth once daily for probiotic   TRAMADOL (ULTRAM) 50 MG TABLET    TAKE ONE TABLET BY MOUTH 4 TIMES DAILY AS NEEDED FOR PAIN   UREA (CARMOL) 20 % CREAM    Apply to callus daily  Modified Medications   No medications on file  Discontinued Medications   CALCIUM PO    Take 1 tablet by mouth daily.   CHOLECALCIFEROL (VITAMIN D PO)    Take 1 tablet by mouth daily.   LEVOTHYROXINE (SYNTHROID, LEVOTHROID) 25 MCG TABLET    Take 25 mcg by mouth daily before breakfast.   MOVIPREP 100 G SOLR    Take 1 kit (200 g total) by mouth once.     Review of Systems  Constitutional: Negative for fever, chills and unexpected weight change.       Regaining weight.  HENT: Negative for congestion, ear discharge, ear pain, hearing loss, sore throat and tinnitus.   Eyes: Negative for photophobia and pain.  Respiratory: Negative for cough, shortness of breath and wheezing.   Cardiovascular: Negative for chest pain, palpitations and leg swelling.  Gastrointestinal: Positive for diarrhea. Negative for nausea, vomiting, abdominal pain and constipation.  Endocrine: Negative for polydipsia.  Genitourinary: Negative for dysuria, urgency and frequency.  Musculoskeletal: Positive for back pain (Chronic low back discomfort with pain radiating down the right leg). Negative for neck pain.       Diffuse arthralgias. History of rheumatoid arthritis. Currently on Enbrel. Under the care of Dr. Amil Amen.  Skin: Negative for rash.       callus on heels. Painful corn on the sole of the right foot.  Allergic/Immunologic: Negative for environmental allergies.  Neurological: Negative for dizziness, tremors, seizures, weakness and headaches.  Hematological: Does not bruise/bleed easily.  Psychiatric/Behavioral: Negative for suicidal ideas and hallucinations. The patient is nervous/anxious.     Filed Vitals:   04/21/15 1159  BP: 146/88  Pulse: 80  Temp: 98.3 F (36.8 C)  TempSrc: Oral  Height: 4' 8"  (1.422 m)  Weight: 103 lb  9.6 oz (46.993 kg)   Body mass index is 23.24 kg/(m^2).  Physical Exam  Constitutional: She is oriented to person, place, and time. She appears well-developed and well-nourished.  HENT:  Head: Normocephalic.  Right Ear: External ear normal.  Left Ear: External ear normal.  Mouth/Throat: No oropharyngeal exudate.  Eyes: Conjunctivae and EOM are normal. Pupils are equal, round, and reactive to light.  Neck: Neck supple. No tracheal deviation present. Thyromegaly present.  Cardiovascular: Normal rate, regular rhythm, normal heart sounds and intact distal pulses.   Pulmonary/Chest: Effort normal and breath sounds normal. No stridor.  Abdominal: Soft. Bowel sounds are normal.  Musculoskeletal: She exhibits no edema or tenderness.  Bilateral bunions and right hammer toe. Tender right SI joint. Chronic back discomfort  with pain radiating down the right leg.  Lymphadenopathy:    She has no cervical adenopathy.  Neurological: She is oriented to person, place, and time. She has normal reflexes. No cranial nerve deficit. Coordination normal.  Skin: Skin is warm and dry. No rash noted.  Psychiatric: She has a normal mood and affect. Her behavior is normal. Judgment and thought content normal.     Labs reviewed: Admission on 04/15/2015, Discharged on 04/17/2015  Component Date Value Ref Range Status  . Lipase 04/14/2015 18* 22 - 51 U/L Final  . Sodium 04/14/2015 133* 135 - 145 mmol/L Final  . Potassium 04/14/2015 2.8* 3.5 - 5.1 mmol/L Final  . Chloride 04/14/2015 95* 101 - 111 mmol/L Final  . CO2 04/14/2015 26  22 - 32 mmol/L Final  . Glucose, Bld 04/14/2015 121* 65 - 99 mg/dL Final  . BUN 04/14/2015 17  6 - 20 mg/dL Final  . Creatinine, Ser 04/14/2015 0.75  0.44 - 1.00 mg/dL Final  . Calcium 04/14/2015 8.6* 8.9 - 10.3 mg/dL Final  . Total Protein 04/14/2015 6.7  6.5 - 8.1 g/dL Final  . Albumin 04/14/2015 3.6  3.5 - 5.0 g/dL Final  . AST 04/14/2015 25  15 - 41 U/L Final  . ALT  04/14/2015 19  14 - 54 U/L Final  . Alkaline Phosphatase 04/14/2015 49  38 - 126 U/L Final  . Total Bilirubin 04/14/2015 0.5  0.3 - 1.2 mg/dL Final  . GFR calc non Af Amer 04/14/2015 >60  >60 mL/min Final  . GFR calc Af Amer 04/14/2015 >60  >60 mL/min Final   Comment: (NOTE) The eGFR has been calculated using the CKD EPI equation. This calculation has not been validated in all clinical situations. eGFR's persistently <60 mL/min signify possible Chronic Kidney Disease.   . Anion gap 04/14/2015 12  5 - 15 Final  . WBC 04/14/2015 18.5* 4.0 - 10.5 K/uL Final  . RBC 04/14/2015 4.41  3.87 - 5.11 MIL/uL Final  . Hemoglobin 04/14/2015 13.7  12.0 - 15.0 g/dL Final  . HCT 04/14/2015 40.3  36.0 - 46.0 % Final  . MCV 04/14/2015 91.4  78.0 - 100.0 fL Final  . MCH 04/14/2015 31.1  26.0 - 34.0 pg Final  . MCHC 04/14/2015 34.0  30.0 - 36.0 g/dL Final  . RDW 04/14/2015 13.5  11.5 - 15.5 % Final  . Platelets 04/14/2015 323  150 - 400 K/uL Final  . Color, Urine 04/14/2015 YELLOW  YELLOW Final  . APPearance 04/14/2015 HAZY* CLEAR Final  . Specific Gravity, Urine 04/14/2015 >1.030* 1.005 - 1.030 Final  . pH 04/14/2015 6.0  5.0 - 8.0 Final  . Glucose, UA 04/14/2015 NEGATIVE  NEGATIVE mg/dL Final  . Hgb urine dipstick 04/14/2015 LARGE* NEGATIVE Final  . Bilirubin Urine 04/14/2015 SMALL* NEGATIVE Final  . Ketones, ur 04/14/2015 40* NEGATIVE mg/dL Final  . Protein, ur 04/14/2015 NEGATIVE  NEGATIVE mg/dL Final  . Urobilinogen, UA 04/14/2015 0.2  0.0 - 1.0 mg/dL Final  . Nitrite 04/14/2015 POSITIVE* NEGATIVE Final  . Leukocytes, UA 04/14/2015 SMALL* NEGATIVE Final  . Squamous Epithelial / LPF 04/14/2015 RARE  RARE Final  . WBC, UA 04/14/2015 0-2  <3 WBC/hpf Final  . RBC / HPF 04/14/2015 3-6  <3 RBC/hpf Final  . Urine-Other 04/14/2015 MUCOUS PRESENT   Final   Comment: LESS THAN 10 mL OF URINE SUBMITTED MICROSCOPIC EXAM PERFORMED ON UNCONCENTRATED URINE   . Magnesium 04/15/2015 1.9  1.7 - 2.4 mg/dL Final    .  Sodium 04/15/2015 138  135 - 145 mmol/L Final  . Potassium 04/15/2015 3.6  3.5 - 5.1 mmol/L Final   DELTA CHECK NOTED  . Chloride 04/15/2015 105  101 - 111 mmol/L Final  . CO2 04/15/2015 25  22 - 32 mmol/L Final  . Glucose, Bld 04/15/2015 93  65 - 99 mg/dL Final  . BUN 04/15/2015 8  6 - 20 mg/dL Final  . Creatinine, Ser 04/15/2015 0.61  0.44 - 1.00 mg/dL Final  . Calcium 04/15/2015 7.8* 8.9 - 10.3 mg/dL Final  . GFR calc non Af Amer 04/15/2015 >60  >60 mL/min Final  . GFR calc Af Amer 04/15/2015 >60  >60 mL/min Final   Comment: (NOTE) The eGFR has been calculated using the CKD EPI equation. This calculation has not been validated in all clinical situations. eGFR's persistently <60 mL/min signify possible Chronic Kidney Disease.   . Anion gap 04/15/2015 8  5 - 15 Final  . C Diff antigen 04/16/2015 NEGATIVE  NEGATIVE Final  . C Diff toxin 04/16/2015 NEGATIVE  NEGATIVE Final  . C Diff interpretation 04/16/2015 Negative for toxigenic C. difficile   Final  . Sodium 04/16/2015 137  135 - 145 mmol/L Final  . Potassium 04/16/2015 3.7  3.5 - 5.1 mmol/L Final  . Chloride 04/16/2015 106  101 - 111 mmol/L Final  . CO2 04/16/2015 25  22 - 32 mmol/L Final  . Glucose, Bld 04/16/2015 101* 65 - 99 mg/dL Final  . BUN 04/16/2015 <5* 6 - 20 mg/dL Final  . Creatinine, Ser 04/16/2015 0.53  0.44 - 1.00 mg/dL Final  . Calcium 04/16/2015 7.4* 8.9 - 10.3 mg/dL Final  . GFR calc non Af Amer 04/16/2015 >60  >60 mL/min Final  . GFR calc Af Amer 04/16/2015 >60  >60 mL/min Final   Comment: (NOTE) The eGFR has been calculated using the CKD EPI equation. This calculation has not been validated in all clinical situations. eGFR's persistently <60 mL/min signify possible Chronic Kidney Disease.   . Anion gap 04/16/2015 6  5 - 15 Final  . WBC 04/16/2015 8.2  4.0 - 10.5 K/uL Final  . RBC 04/16/2015 3.54* 3.87 - 5.11 MIL/uL Final  . Hemoglobin 04/16/2015 11.1* 12.0 - 15.0 g/dL Final  . HCT 04/16/2015 33.3*  36.0 - 46.0 % Final  . MCV 04/16/2015 94.1  78.0 - 100.0 fL Final  . MCH 04/16/2015 31.4  26.0 - 34.0 pg Final  . MCHC 04/16/2015 33.3  30.0 - 36.0 g/dL Final  . RDW 04/16/2015 14.3  11.5 - 15.5 % Final  . Platelets 04/16/2015 234  150 - 400 K/uL Final  . TSH 04/16/2015 1.876  0.350 - 4.500 uIU/mL Final  Appointment on 04/07/2015  Component Date Value Ref Range Status  . Toxigenic C Difficile by pcr 04/07/2015 Not Detected  Not Detected Final   Comment: This test is for use only with liquid or soft stools; performance characteristics of other clinical specimen types have not been established.   This assay was performed by Cepheid GeneXpert(R) PCR. The performance characteristics of this assay have been determined by [employer]. Performance characteristics refer to the analytical performance of the test.   Orders Only on 04/07/2015  Component Date Value Ref Range Status  . Giardia Screen (EIA) 04/07/2015 NEGATIVE   Final  . Cryptosporidium Screen (EIA) 04/07/2015 NEGATIVE   Final  Appointment on 04/06/2015  Component Date Value Ref Range Status  . TSH 04/06/2015 1.60  0.35 - 4.50 uIU/mL Final  Appointment on 04/05/2015  Component Date Value Ref Range Status  . IgA 04/05/2015 182  68 - 378 mg/dL Final      . Tissue Transglutaminase Ab, IgA 04/05/2015 1  <4 U/mL Final   Comment: Value Interpretation:   <4:   Antibody Not Detected  >=4:   Antibody Detected   . WBC 04/05/2015 9.2  4.0 - 10.5 K/uL Final  . RBC 04/05/2015 4.39  3.87 - 5.11 Mil/uL Final  . Hemoglobin 04/05/2015 13.5  12.0 - 15.0 g/dL Final  . HCT 04/05/2015 40.8  36.0 - 46.0 % Final  . MCV 04/05/2015 93.0  78.0 - 100.0 fl Final  . MCHC 04/05/2015 33.1  30.0 - 36.0 g/dL Final  . RDW 04/05/2015 14.1  11.5 - 15.5 % Final  . Platelets 04/05/2015 328.0  150.0 - 400.0 K/uL Final  . Neutrophils Relative % 04/05/2015 64.6  43.0 - 77.0 % Final  . Lymphocytes Relative 04/05/2015 23.8  12.0 - 46.0 % Final  .  Monocytes Relative 04/05/2015 10.8  3.0 - 12.0 % Final  . Eosinophils Relative 04/05/2015 0.4  0.0 - 5.0 % Final  . Basophils Relative 04/05/2015 0.4  0.0 - 3.0 % Final  . Neutro Abs 04/05/2015 6.0  1.4 - 7.7 K/uL Final  . Lymphs Abs 04/05/2015 2.2  0.7 - 4.0 K/uL Final  . Monocytes Absolute 04/05/2015 1.0  0.1 - 1.0 K/uL Final  . Eosinophils Absolute 04/05/2015 0.0  0.0 - 0.7 K/uL Final  . Basophils Absolute 04/05/2015 0.0  0.0 - 0.1 K/uL Final  Appointment on 03/24/2015  Component Date Value Ref Range Status  . C difficile Toxins A+B, EIA 03/24/2015 Negative  Negative Final  . Salmonella/Shigella Screen 03/24/2015 Final report   Final  . RESULT 1 03/24/2015 Comment   Final   No Salmonella or Shigella recovered.  . Campylobacter Culture 03/24/2015 Final report   Final  . RESULT 1 03/24/2015 Comment   Final   No Campylobacter species isolated.  . E coli, Shiga toxin Assay 03/24/2015 Negative  Negative Final     Assessment/Plan  1. Diarrhea Appears to be resolving - Basic metabolic panel; Future  2. Hypokalemia Improved - Basic metabolic panel; Future  3. Hypothyroidism, unspecified hypothyroidism type Continue see Dr. Elyse Hsu. Most recent TSH on 04/16/2015 was 1.876. - TSH; Future  4. Essential hypertension controlled - Basic metabolic panel; Future  5. Monilia infection - fluconazole (DIFLUCAN) 150 MG tablet; Take 1 tablet (150 mg total) by mouth once.  Dispense: 1 tablet; Refill: 0  6. Corn Sharply debrided in the office today.

## 2015-04-26 ENCOUNTER — Encounter: Payer: Self-pay | Admitting: Gastroenterology

## 2015-04-26 ENCOUNTER — Ambulatory Visit (INDEPENDENT_AMBULATORY_CARE_PROVIDER_SITE_OTHER): Payer: Medicare Other | Admitting: Gastroenterology

## 2015-04-26 VITALS — BP 118/68 | HR 72 | Ht <= 58 in | Wt 102.2 lb

## 2015-04-26 DIAGNOSIS — R197 Diarrhea, unspecified: Secondary | ICD-10-CM

## 2015-04-26 NOTE — Patient Instructions (Signed)
Please follow up as needed 

## 2015-04-29 ENCOUNTER — Telehealth: Payer: Self-pay | Admitting: Internal Medicine

## 2015-04-29 NOTE — Telephone Encounter (Signed)
Patient given instructions. Scheduled OV with Dr. Henrene Pastor on 06/23/15 at 2:45 PM.

## 2015-04-29 NOTE — Telephone Encounter (Signed)
Left a message for patient to call back. 

## 2015-04-29 NOTE — Telephone Encounter (Signed)
We discussed EXTENSIVELY at her visit and her husband was there as well.  Continue Florastor twice a day until all of what she has is gone.  I told her that she may continue to have some loose stools for a little while before things go back to normal in the gut after her suspected infectious colitis and antibiotics.  She was told to discontinue the Lomotil and see how her stools were then to decrease colestipol to once a day and then discontinue (make each change gradually) so we can see how she is going to do without these medications.  The Bentyl is only as needed for abdominal cramping.    Jess

## 2015-04-29 NOTE — Telephone Encounter (Signed)
Patient is calling to ask how to take her medications. She reports she is still having loose stools. She does not remember what Alonza Bogus, PA told her to do. She has Lomotil, Bentyl, colestipol and Florastor. She does not know which to take and how to take it. Please, advise.

## 2015-04-30 ENCOUNTER — Encounter: Payer: Self-pay | Admitting: Gastroenterology

## 2015-04-30 ENCOUNTER — Other Ambulatory Visit: Payer: Self-pay | Admitting: Internal Medicine

## 2015-04-30 NOTE — Progress Notes (Signed)
     04/26/2015 Joan [name] [id number] 1946/01/29   History of Present Illness:  This is a 69 year old female who is known to Dr. Henrene Pastor for colonoscopy in 2007 that was normal.  Was seen by Laveda Abbe in our office on 8/1 for diarrhea.  Was evaluated with some stool studies and colonoscopy was scheduled.  Was treated empirically for infectious source with flagyl 250 mg TID for 10 days, Florastor probiotic, and colestid.  She ended up going to the hospital and was admitted where further evaluation took place.  CT scan on 04/15/2015 mild mucosal thickening suggested along the sigmoid colon, possibly reflecting mild infectious or inflammatory process.  Colonoscopy 04/15/2015 by Dr. Carlean Purl showed severe diverticulosis in the sigmoid colon but the mucosa was normal; random biopsies were obtained and showed active colitis with active neutrophilic inflammation but no chronic changes, c/w infectious source.  Was treated with course of cipro and flagyl.  Also she was continued on Florastor, given Lomotil and colestid.    She is here today for follow-up.  Denies any further diarrhea but is terribly afraid that it will return.  She is just eating soft, bland foods and is still taking colestid twice a day and lomotil regularly as well as Florastor BID.  She is here with her husband and has MULTIPLE questions regarding diet.   Current Medications, Allergies, Past Medical History, Past Surgical History, Family History and Social History were reviewed in [employer] record.   Physical Exam: BP 118/68 mmHg  Pulse 72  Ht 4' 8.75" (1.441 m)  Wt 102 lb 4 oz (46.38 kg)  BMI 22.34 kg/m2 General: Well developed white female in no acute distress Head: Normocephalic and atraumatic Eyes:  Sclerae anicteric, conjunctiva pink  Ears: Normal auditory acuity Lungs: Clear throughout to auscultation Heart: Regular rate and rhythm Abdomen: Soft, non-distended.  Normal bowel sounds.   Non-tender. Musculoskeletal: Symmetrical with no gross deformities  Extremities: No edema  Neurological: Alert oriented x 4, grossly non-focal Psychological:  Alert and cooperative. Normal mood and affect  Assessment and Recommendations: -Diarrhea:  Thought to be possibly infectious source from colonoscopy biopsies.  No diarrhea currently but still taking Lomotil and colestid.  We discussed EXTENSIVELY (with her husband as well) regarding diet and medication management.  Continue Florastor twice a day until all of what she has is gone.  I told her that she may continue to have some loose stools for a little while before things go back to normal in the gut after her suspected infectious colitis and antibiotic use.  She was told to discontinue the Lomotil and see how her stools were then to decrease colestipol to once a day and then discontinue (make each change gradually) so we can see how she is going to do without these medications.  The Bentyl is only as needed for abdominal cramping.    *45 minutes spent with patient over 50% in counseling and discussion of options regarding care.

## 2015-05-01 NOTE — Progress Notes (Signed)
Agree 

## 2015-05-05 ENCOUNTER — Ambulatory Visit: Payer: Medicare Other | Admitting: Physician Assistant

## 2015-05-06 ENCOUNTER — Telehealth: Payer: Self-pay | Admitting: Internal Medicine

## 2015-05-06 DIAGNOSIS — M5431 Sciatica, right side: Secondary | ICD-10-CM

## 2015-05-06 NOTE — Telephone Encounter (Signed)
Patient called the office this morning asking about a referral for back injections from Dr. Jola Baptist  Patient stated when she saw you on 04/21/15 she discussed with you that she would like a referral for back injections to Dr. Jola Baptist at  [facility] [address], [place] [address] [phone]  No order or referral was placed   Please advise    (Patient was notified Dr. Nyoka Cowden is on vacation and it may be next week before she hears back from [place] on this issue)

## 2015-05-07 NOTE — Telephone Encounter (Signed)
Order was placed on the visit of 03/23/2015. Please go ahead with scheduling this and request Dr. Jola Baptist.

## 2015-05-07 NOTE — Telephone Encounter (Signed)
Order placed

## 2015-05-11 ENCOUNTER — Telehealth: Payer: Self-pay | Admitting: Internal Medicine

## 2015-05-11 NOTE — Telephone Encounter (Signed)
Pt wanted to know if she could continue taking the florastor bid. Discussed with pt that she can and that she should have already stopped the lomotil. Pt states she is taking cholestipol daily. Instructed pt to take it daily for maybe a week and see if she can take it every other day after that and see how she does. Pt knows to keep her follow-up appt. Reviewed with pt that she should continue to avoid diary.

## 2015-05-12 ENCOUNTER — Other Ambulatory Visit: Payer: Self-pay | Admitting: Neurosurgery

## 2015-05-12 DIAGNOSIS — M545 Low back pain, unspecified: Secondary | ICD-10-CM

## 2015-05-12 DIAGNOSIS — G8929 Other chronic pain: Secondary | ICD-10-CM

## 2015-05-17 ENCOUNTER — Ambulatory Visit
Admission: RE | Admit: 2015-05-17 | Discharge: 2015-05-17 | Disposition: A | Discharge status: Home / Self Care | Payer: Medicare Other | Source: Ambulatory Visit | Attending: Neurosurgery | Admitting: Neurosurgery

## 2015-05-17 DIAGNOSIS — G8929 Other chronic pain: Secondary | ICD-10-CM

## 2015-05-17 DIAGNOSIS — M545 Low back pain: Principal | ICD-10-CM

## 2015-05-17 MED ORDER — METHYLPREDNISOLONE ACETATE 40 MG/ML INJ SUSP (RADIOLOG
120.0000 mg | Freq: Once | INTRAMUSCULAR | Status: AC
  Administered 2015-05-17: 120 mg via EPIDURAL

## 2015-05-17 MED ORDER — IOHEXOL 180 MG/ML  SOLN
1.0000 mL | Freq: Once | INTRAMUSCULAR | Status: DC | PRN
  Administered 2015-05-17: 1 mL via EPIDURAL

## 2015-05-17 NOTE — Discharge Instructions (Signed)

## 2015-05-24 ENCOUNTER — Other Ambulatory Visit: Payer: Medicare Other

## 2015-05-24 DIAGNOSIS — E876 Hypokalemia: Secondary | ICD-10-CM

## 2015-05-24 DIAGNOSIS — I10 Essential (primary) hypertension: Secondary | ICD-10-CM

## 2015-05-24 DIAGNOSIS — R197 Diarrhea, unspecified: Secondary | ICD-10-CM

## 2015-05-24 DIAGNOSIS — E039 Hypothyroidism, unspecified: Secondary | ICD-10-CM

## 2015-05-25 LAB — BASIC METABOLIC PANEL
BUN / CREAT RATIO: 36 — AB (ref 11–26)
BUN: 22 mg/dL (ref 8–27)
CALCIUM: 9.3 mg/dL (ref 8.7–10.3)
CHLORIDE: 98 mmol/L (ref 97–108)
CO2: 28 mmol/L (ref 18–29)
Creatinine, Ser: 0.61 mg/dL (ref 0.57–1.00)
GFR, EST AFRICAN AMERICAN: 108 mL/min/{1.73_m2} (ref >59)
GFR, EST NON AFRICAN AMERICAN: 94 mL/min/{1.73_m2} (ref >59)
Glucose: 84 mg/dL (ref 65–99)
POTASSIUM: 4.2 mmol/L (ref 3.5–5.2)
Sodium: 141 mmol/L (ref 134–144)

## 2015-05-25 LAB — TSH: TSH: 4.71 u[IU]/mL — AB (ref 0.450–4.500)

## 2015-05-27 ENCOUNTER — Other Ambulatory Visit: Payer: Self-pay | Admitting: *Deleted

## 2015-05-27 MED ORDER — PREDNISONE 5 MG PO TABS: 5.0000 mg | ORAL_TABLET | Freq: Every day | ORAL | Status: DC

## 2015-05-27 NOTE — Telephone Encounter (Signed)
[facility] 

## 2015-05-28 ENCOUNTER — Other Ambulatory Visit: Payer: Medicare Other

## 2015-06-02 ENCOUNTER — Ambulatory Visit (INDEPENDENT_AMBULATORY_CARE_PROVIDER_SITE_OTHER): Payer: Medicare Other | Admitting: Internal Medicine

## 2015-06-02 ENCOUNTER — Telehealth: Payer: Self-pay

## 2015-06-02 ENCOUNTER — Encounter: Payer: Self-pay | Admitting: Internal Medicine

## 2015-06-02 VITALS — BP 134/78 | HR 73 | Temp 98.7°F | Ht <= 58 in | Wt 101.4 lb

## 2015-06-02 DIAGNOSIS — M5387 Other specified dorsopathies, lumbosacral region: Secondary | ICD-10-CM

## 2015-06-02 DIAGNOSIS — I10 Essential (primary) hypertension: Secondary | ICD-10-CM | POA: Diagnosis not present

## 2015-06-02 DIAGNOSIS — R197 Diarrhea, unspecified: Secondary | ICD-10-CM | POA: Diagnosis not present

## 2015-06-02 DIAGNOSIS — E039 Hypothyroidism, unspecified: Secondary | ICD-10-CM

## 2015-06-02 DIAGNOSIS — M069 Rheumatoid arthritis, unspecified: Secondary | ICD-10-CM | POA: Diagnosis not present

## 2015-06-02 DIAGNOSIS — M5431 Sciatica, right side: Secondary | ICD-10-CM | POA: Diagnosis not present

## 2015-06-02 NOTE — Telephone Encounter (Signed)
Patient was seen today and discussed getting injection in her back to help with pain. Patient was calling to make sure referral order was placed for her to get back injection next month.  Dr.Green please place referral order

## 2015-06-02 NOTE — Progress Notes (Signed)
Patient ID: Joan [name], female   DOB: 1946-08-06, 69 y.o.   MRN: [id number]    Facility  [facility]    Place of Service:   OFFICE    Allergies  Allergen Reactions  . Arava [Leflunomide] Diarrhea, Nausea Only and Other (See Comments)    Loss appetite  . Methotrexate Derivatives Other (See Comments)    Hair loss  . Neurontin [Gabapentin] Nausea And Vomiting  . Penicillins Rash    Chief Complaint  Patient presents with  . Medical Management of Chronic Issues    Medical Management of chronic issues    HPI:   Essential hypertension - controlled  Diarrhea - much improved. She is still having an occasional loose bowel movement. She has formed stools most days. Denies fever or night sweats. No abdominal pain. She continues to use Lomotil occasionally and Bentyl occasionally. She is also continuing her Florastor, but says she would like to stop it. She is now off colestipol.  Sciatica of right side associated with disorder of lumbosacral spine -patient had epidural steroid injection since last seen. This was done 05/17/2015. It has helped some. She would like to go back next month for another injection.  Hypothyroidism, unspecified hypothyroidism type - mild rise in TSH. It is still close to normal that I am not going to resume levothyroxine. Patient still has concerns about the levothyroxine being associated with her previous diarrhea. Dr. Elyse Hsu continues to follow her. His last TSH was 4.00.  Rheumatoid arthritis - continues to have arthritis discomfort. She has not seen her arthritis doctor at his new location.   Osteoporosis - Last bone density was 2013. She is taking raloxifene 60 mg daily.  Bunion - scheduled for left bunionectomy on 06/04/2015 by Dr.Strom in [place], [place].    Medications: Patient's Medications  New Prescriptions   No medications on file  Previous Medications   ACETAMINOPHEN (TYLENOL) 500 MG TABLET    Take 500 mg by mouth every 6 (six) hours  as needed for pain. Take two tablets up to four times a day as needed   AMITRIPTYLINE (ELAVIL) 10 MG TABLET    TAKE ONE TABLET BY MOUTH AT BEDTIME TO HELP  RELAX MUSCLES   COLESTIPOL (COLESTID) 1 G TABLET    Take 1 tablet (1 g total) by mouth 2 (two) times daily.   DICLOFENAC-MISOPROSTOL 75-0.2 MG TBEC    Take 1 tablet twice daily for arthritis.   DICYCLOMINE (BENTYL) 20 MG TABLET    One every 6 hours as needed to control diarrhea.   DIPHENOXYLATE-ATROPINE (LOMOTIL) 2.5-0.025 MG PER TABLET    Take one tablet by mouth four times daily as needed for diarrhea or loose stools   ENBREL SURECLICK 50 MG/ML INJECTION    Inject 50 mg as directed once a week. Inject once weekly for arthritis   EVISTA 60 MG TABLET    Take one tablet once daily for osteoporosis   FLUTICASONE (FLONASE) 50 MCG/ACT NASAL SPRAY    Place 1 spray into the nose daily.   FOLIC ACID (FOLVITE) 1 MG TABLET    Take 1 mg by mouth daily. Take two tablets every morning.   GLUCOSAMINE-CHONDROIT-VIT C-MN (GLUCOSAMINE CHONDR 500 COMPLEX PO)    Take 500 mg by mouth daily. Take 2 tablets daily for joint health.   HYDROCHLOROTHIAZIDE (MICROZIDE) 12.5 MG CAPSULE    TAKE ONE CAPSULE BY MOUTH ONCE DAILY FOR BLOOD PRESSURE   MULTIPLE VITAMIN (MULTIVITAMIN) TABLET    Take 1 tablet by mouth  daily. Take 1 tablet once a daily   MUPIROCIN CREAM (BACTROBAN) 2 %    Apply once daily to the affected area with dressing change   OMEGA-3 FATTY ACIDS (FISH OIL) 1000 MG CAPS    Take 1,000 mg by mouth daily. Take one tablet once a daily for heart, joint and skin health.   PREDNISONE (DELTASONE) 5 MG TABLET    Take 1 tablet (5 mg total) by mouth daily with breakfast. For arthritis   SACCHAROMYCES BOULARDII (FLORASTOR) 250 MG CAPSULE    Take one tablet by mouth once daily for probiotic   TRAMADOL (ULTRAM) 50 MG TABLET    TAKE ONE TABLET BY MOUTH 4 TIMES DAILY AS NEEDED FOR PAIN   UREA (CARMOL) 20 % CREAM    Apply to callus daily  Modified Medications   No  medications on file  Discontinued Medications   No medications on file     Review of Systems  Constitutional: Negative for fever, chills and unexpected weight change.       Regaining weight.  HENT: Negative for congestion, ear discharge, ear pain, hearing loss, sore throat and tinnitus.   Eyes: Negative for photophobia and pain.  Respiratory: Negative for cough, shortness of breath and wheezing.   Cardiovascular: Negative for chest pain, palpitations and leg swelling.  Gastrointestinal: Positive for diarrhea. Negative for nausea, vomiting, abdominal pain and constipation.  Endocrine: Negative for polydipsia.  Genitourinary: Negative for dysuria, urgency and frequency.  Musculoskeletal: Positive for back pain (Chronic low back discomfort with pain radiating down the right leg). Negative for neck pain.       Diffuse arthralgias. History of rheumatoid arthritis. Currently on Enbrel. Under the care of Dr. Amil Amen. Scheduled for bunionectomy by Dr. Wilhelmina Mcardle 06/04/2015.  Skin: Negative for rash.       callus on heels. Painful corn on the sole of the right foot.  Allergic/Immunologic: Negative for environmental allergies.  Neurological: Negative for dizziness, tremors, seizures, weakness and headaches.  Hematological: Does not bruise/bleed easily.  Psychiatric/Behavioral: Negative for suicidal ideas and hallucinations. The patient is nervous/anxious.     Filed Vitals:   06/02/15 1348  BP: 134/78  Pulse: 73  Temp: 98.7 F (37.1 C)  TempSrc: Oral  Height: 4' 8.75" (1.441 m)  Weight: 101 lb 6.4 oz (45.995 kg)   Body mass index is 22.15 kg/(m^2).  Physical Exam  Constitutional: She is oriented to person, place, and time. She appears well-developed and well-nourished.  HENT:  Head: Normocephalic.  Right Ear: External ear normal.  Left Ear: External ear normal.  Mouth/Throat: No oropharyngeal exudate.  Eyes: Conjunctivae and EOM are normal. Pupils are equal, round, and reactive to  light.  Neck: Neck supple. No tracheal deviation present. Thyromegaly present.  Cardiovascular: Normal rate, regular rhythm, normal heart sounds and intact distal pulses.   Pulmonary/Chest: Effort normal and breath sounds normal. No stridor.  Abdominal: Soft. Bowel sounds are normal.  Musculoskeletal: She exhibits no edema or tenderness.  Bilateral bunions and right hammer toe. Tender right SI joint. Chronic back discomfort with pain radiating down the right leg.  Lymphadenopathy:    She has no cervical adenopathy.  Neurological: She is oriented to person, place, and time. She has normal reflexes. No cranial nerve deficit. Coordination normal.  Skin: Skin is warm and dry. No rash noted.  Psychiatric: She has a normal mood and affect. Her behavior is normal. Judgment and thought content normal.     Labs reviewed: Lab Summary Latest Ref Rng 05/24/2015  04/16/2015 04/15/2015 04/14/2015  Hemoglobin 12.0 - 15.0 g/dL (None) 11.1(L) (None) 13.7  Hematocrit 36.0 - 46.0 % (None) 33.3(L) (None) 40.3  White count 4.0 - 10.5 K/uL (None) 8.2 (None) 18.5(H)  Platelet count 150 - 400 K/uL (None) 234 (None) 323  Sodium 134 - 144 mmol/L 141 137 138 133(L)  Potassium 3.5 - 5.2 mmol/L 4.2 3.7 3.6 2.8(L)  Calcium 8.7 - 10.3 mg/dL 9.3 7.4(L) 7.8(L) 8.6(L)  Phosphorus - (None) (None) (None) (None)  Creatinine 0.57 - 1.00 mg/dL 0.61 0.53 0.61 0.75  AST 15 - 41 U/L (None) (None) (None) 25  Alk Phos 38 - 126 U/L (None) (None) (None) 49  Bilirubin 0.3 - 1.2 mg/dL (None) (None) (None) 0.5  Glucose 65 - 99 mg/dL 84 101(H) 93 121(H)  Cholesterol - (None) (None) (None) (None)  HDL cholesterol - (None) (None) (None) (None)  Triglycerides - (None) (None) (None) (None)  LDL Direct - (None) (None) (None) (None)  LDL Calc - (None) (None) (None) (None)  Total protein 6.5 - 8.1 g/dL (None) (None) (None) 6.7  Albumin 3.5 - 5.0 g/dL (None) (None) (None) 3.6   Lab Results  Component Value Date   TSH 4.710* 05/24/2015     THYROIDAB 28 12/03/2012   Lab Results  Component Value Date   BUN 22 05/24/2015   No results found for: HGBA1C     Assessment/Plan 1. Essential hypertension Continue current medications - Comprehensive metabolic panel; Future  2. Diarrhea Continue Lomotil and Bentyl as needed. Discontinue Florastor. - Comprehensive metabolic panel; Future  3. Sciatica of right side associated with disorder of lumbosacral spine Schedule epidural steroid injection mid-October 2016 by Dr.Curnes.  4. Hypothyroidism, unspecified hypothyroidism type Continue see Dr. Elyse Hsu - TSH; Future  5. Rheumatoid arthritis Recommend that she call Dr. Amil Amen and schedule appointment.

## 2015-06-08 ENCOUNTER — Encounter: Payer: Medicare Other | Admitting: Internal Medicine

## 2015-06-23 ENCOUNTER — Ambulatory Visit (INDEPENDENT_AMBULATORY_CARE_PROVIDER_SITE_OTHER): Payer: Medicare Other | Admitting: Internal Medicine

## 2015-06-23 ENCOUNTER — Encounter: Payer: Self-pay | Admitting: Internal Medicine

## 2015-06-23 VITALS — BP 108/60 | HR 80 | Ht <= 58 in | Wt 102.1 lb

## 2015-06-23 DIAGNOSIS — R197 Diarrhea, unspecified: Secondary | ICD-10-CM | POA: Diagnosis not present

## 2015-06-23 DIAGNOSIS — K5730 Diverticulosis of large intestine without perforation or abscess without bleeding: Secondary | ICD-10-CM | POA: Diagnosis not present

## 2015-06-23 NOTE — Progress Notes (Signed)
HISTORY OF PRESENT ILLNESS:  Joan [name] is a 69 y.o. female with past medical history as listed below. She was evaluated by the physician assistant in early August with 2 month history of diarrhea. Stool studies were negative. Empirically treated with metronidazole, flora store, and Colestid. Subsequently hospitalized 04/15/2015 with diarrhea and dehydration. CT scan suggested thickening of the sigmoid colon. Colonoscopy revealed severe sigmoid diverticulosis but was otherwise negative including intubation of the ileum. Random colon biopsies revealed changes consistent with acute self-limited infectious colitis. She was treated symptomatically with dicyclomine and Lomotil. She was seen in follow-up 04/26/2015. Today's follow-up arranged. She reports that she is doing better. She is not requiring antidiarrheal agents or antispasmodics with any regularity. No longer has Colestid. She is accompanied today by her husband. She has multiple questions regarding diet. No incontinence or bleeding.  REVIEW OF SYSTEMS:  All non-GI ROS negative except for arthritis, fatigue  Past Medical History  Diagnosis Date  . Hypothyroidism   . Impacted cerumen     right ear  . Thyrotoxicosis   . Unspecified vitamin D deficiency   . Nontoxic uninodular goiter   . Other left bundle branch block   . External hemorrhoids without mention of complication   . Alopecia areata 02/16/2006  . Chronic rhinitis 09/01/05  . Myalgia and myositis, unspecified 03/01/05  . Rheumatoid arthritis(714.0) 10/05/2004    lumbar spondylosis. Lumbo-sacral anterolisthesis    . Dysphagia, unspecified(787.20) 2004  . Anxiety state, unspecified 2004  . Benign paroxysmal positional vertigo 04/01/03  . 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 Osteoarthrosis, unspecified whether generalized or localized, unspecified site 04/01/03  . Cervicalgia 04/01/03  . Senile osteoporosis 04/01/03  . Hypertension 2004  . Enthesopathy of ankle and tarsus,  unspecified 03/31/96  . Corns and callosities   . Dermatophytosis of nail   . Thyrotoxicosis without mention of goiter or other cause, without mention of thyrotoxic crisis or storm   . Lumbago   . Urinary frequency   . Sprain of metatarsophalangeal (joint) of foot   . Fibromyalgia   . Diarrhea 04/15/2015  . Diverticulosis     Past Surgical History  Procedure Laterality Date  . Tubal ligation  1973  . Tonsillectomy and adenoidectomy  1974  . Right wrist sugery for tenosynovitis  1997    DR SYPHER   . Remove ganglion  1998    DR SYPHER   . Foot surgery Right 05/22/2014    Dr.Strom   . Colonoscopy    . Colonoscopy N/A 04/15/2015    Procedure: COLONOSCOPY;  Surgeon: Gatha Mayer, MD;  Location: [facility];  Service: Endoscopy;  Laterality: N/A;    Social History [place]  reports that she has never smoked. She has never used smokeless tobacco. She reports that she does not drink alcohol or use illicit drugs.  family history includes Heart attack in her father; Heart disease in her father.  Allergies  Allergen Reactions  . Arava [Leflunomide] Diarrhea, Nausea Only and Other (See Comments)    Loss appetite  . Methotrexate Derivatives Other (See Comments)    Hair loss  . Neurontin [Gabapentin] Nausea And Vomiting  . Penicillins Rash       PHYSICAL EXAMINATION: Vital signs: BP 108/60 mmHg  Pulse 80  Ht 4' 8.75" (1.441 m)  Wt 102 lb 2 oz (46.324 kg)  BMI 22.31 kg/m2 General: Well-developed, well-nourished, no acute distress HEENT: Sclerae are anicteric, conjunctiva pink. Oral mucosa intact Lungs: Clear Heart: Regular Abdomen: soft, nontender,  nondistended, no obvious ascites, no peritoneal signs, normal bowel sounds. No organomegaly. Extremities: No clubbing cyanosis or edema. Soft cast on left foot submitted Psychiatric: alert and oriented x3. Cooperative   ASSESSMENT:  #1. Diarrhea. Most likely acute self-limited colitis #2. Sigmoid diverticulosis #3.  Gen. medical problems  PLAN:  #1. Long discussion today regarding her problem. Multiple questions answered. #2. Antidiarrheals and antispasmodics as needed only #3. Return to the care of your primary provider. GI follow-up as needed  20 minutes was spent with the patient face-to-face. Greater than 50% of the time was used for counseling regarding her issues with diarrhea and answering multiple questions from her and her husband.

## 2015-06-23 NOTE — Patient Instructions (Signed)
Please follow up as needed 

## 2015-07-05 ENCOUNTER — Other Ambulatory Visit: Payer: Self-pay | Admitting: Internal Medicine

## 2015-07-06 ENCOUNTER — Other Ambulatory Visit: Payer: Self-pay | Admitting: Internal Medicine

## 2015-07-06 ENCOUNTER — Other Ambulatory Visit: Payer: Self-pay | Admitting: Neurosurgery

## 2015-07-06 DIAGNOSIS — M545 Low back pain: Principal | ICD-10-CM

## 2015-07-06 DIAGNOSIS — G8929 Other chronic pain: Secondary | ICD-10-CM

## 2015-08-02 ENCOUNTER — Ambulatory Visit
Admission: RE | Admit: 2015-08-02 | Discharge: 2015-08-02 | Disposition: A | Discharge status: Home / Self Care | Payer: Medicare Other | Source: Ambulatory Visit | Attending: Neurosurgery | Admitting: Neurosurgery

## 2015-08-02 DIAGNOSIS — G8929 Other chronic pain: Secondary | ICD-10-CM

## 2015-08-02 DIAGNOSIS — M545 Low back pain: Principal | ICD-10-CM

## 2015-08-02 MED ORDER — IOHEXOL 180 MG/ML  SOLN
1.0000 mL | Freq: Once | INTRAMUSCULAR | Status: AC | PRN
  Administered 2015-08-02: 1 mL via EPIDURAL

## 2015-08-02 MED ORDER — METHYLPREDNISOLONE ACETATE 40 MG/ML INJ SUSP (RADIOLOG
120.0000 mg | Freq: Once | INTRAMUSCULAR | Status: AC
  Administered 2015-08-02: 120 mg via EPIDURAL

## 2015-08-02 NOTE — Discharge Instructions (Signed)

## 2015-08-27 ENCOUNTER — Other Ambulatory Visit: Payer: Self-pay | Admitting: Nurse Practitioner

## 2015-09-22 ENCOUNTER — Other Ambulatory Visit: Payer: Self-pay | Admitting: Internal Medicine

## 2015-10-04 ENCOUNTER — Other Ambulatory Visit: Payer: Medicare Other

## 2015-10-04 DIAGNOSIS — R197 Diarrhea, unspecified: Secondary | ICD-10-CM

## 2015-10-04 DIAGNOSIS — E039 Hypothyroidism, unspecified: Secondary | ICD-10-CM

## 2015-10-04 DIAGNOSIS — I10 Essential (primary) hypertension: Secondary | ICD-10-CM

## 2015-10-05 LAB — COMPREHENSIVE METABOLIC PANEL
ALK PHOS: 58 IU/L (ref 39–117)
ALT: 9 IU/L (ref 0–32)
AST: 13 IU/L (ref 0–40)
Albumin/Globulin Ratio: 1.4 (ref 1.1–2.5)
Albumin: 3.6 g/dL (ref 3.6–4.8)
BUN/Creatinine Ratio: 31 — ABNORMAL HIGH (ref 11–26)
BUN: 18 mg/dL (ref 8–27)
CHLORIDE: 97 mmol/L (ref 96–106)
CO2: 28 mmol/L (ref 18–29)
Calcium: 8.4 mg/dL — ABNORMAL LOW (ref 8.7–10.3)
Creatinine, Ser: 0.59 mg/dL (ref 0.57–1.00)
GFR calc Af Amer: 109 mL/min/{1.73_m2} (ref >59)
GFR calc non Af Amer: 94 mL/min/{1.73_m2} (ref >59)
GLUCOSE: 77 mg/dL (ref 65–99)
Globulin, Total: 2.6 g/dL (ref 1.5–4.5)
Potassium: 4.1 mmol/L (ref 3.5–5.2)
Sodium: 140 mmol/L (ref 134–144)
TOTAL PROTEIN: 6.2 g/dL (ref 6.0–8.5)

## 2015-10-05 LAB — TSH: TSH: 6.38 u[IU]/mL — AB (ref 0.450–4.500)

## 2015-10-06 ENCOUNTER — Encounter: Payer: Self-pay | Admitting: Internal Medicine

## 2015-10-06 ENCOUNTER — Ambulatory Visit (INDEPENDENT_AMBULATORY_CARE_PROVIDER_SITE_OTHER): Payer: Medicare Other | Admitting: Internal Medicine

## 2015-10-06 VITALS — BP 120/78 | HR 76 | Temp 98.5°F | Resp 20 | Ht <= 58 in | Wt 109.0 lb

## 2015-10-06 DIAGNOSIS — M059 Rheumatoid arthritis with rheumatoid factor, unspecified: Secondary | ICD-10-CM

## 2015-10-06 DIAGNOSIS — H6123 Impacted cerumen, bilateral: Secondary | ICD-10-CM | POA: Diagnosis not present

## 2015-10-06 DIAGNOSIS — M5431 Sciatica, right side: Secondary | ICD-10-CM

## 2015-10-06 DIAGNOSIS — E039 Hypothyroidism, unspecified: Secondary | ICD-10-CM

## 2015-10-06 DIAGNOSIS — Z23 Encounter for immunization: Secondary | ICD-10-CM

## 2015-10-06 DIAGNOSIS — H6120 Impacted cerumen, unspecified ear: Secondary | ICD-10-CM | POA: Insufficient documentation

## 2015-10-06 DIAGNOSIS — M5387 Other specified dorsopathies, lumbosacral region: Secondary | ICD-10-CM

## 2015-10-06 DIAGNOSIS — I10 Essential (primary) hypertension: Secondary | ICD-10-CM | POA: Diagnosis not present

## 2015-10-06 MED ORDER — NEOMYCIN-COLIST-HC-THONZONIUM 3.3-3-10-0.5 MG/ML OT SUSP: OTIC | Status: DC

## 2015-10-06 MED ORDER — TRAMADOL HCL 50 MG PO TABS: ORAL_TABLET | ORAL | Status: DC

## 2015-10-06 MED ORDER — PREDNISONE 5 MG PO TABS: 5.0000 mg | ORAL_TABLET | Freq: Every day | ORAL | Status: DC

## 2015-10-06 NOTE — Patient Instructions (Signed)
You received Prevnar pneumonia vaccine today

## 2015-10-06 NOTE — Progress Notes (Signed)
Patient ID: Joan [name], female   DOB: 14-Oct-1945, 70 y.o.   MRN: [id number]    Facility  [facility]    Place of Service:   OFFICE    Allergies  Allergen Reactions  . Arava [Leflunomide] Diarrhea, Nausea Only and Other (See Comments)    Loss appetite  . Methotrexate Derivatives Other (See Comments)    Hair loss  . Neurontin [Gabapentin] Nausea And Vomiting  . Penicillins Rash    Chief Complaint  Patient presents with  . Medical Management of Chronic Issues    4 month follow-up for Hypertension, Hypothroidism, Rheumatoid Arthritis  . Immunizations    Discuss prevnar13, Patients c/o would like ears checked     HPI:  Congested and stopped up sine 10/01/15. Ears stopped up. No fever. Denies sore throat.  Had injection in the back almost 3 months ago. Seemed to help some. Done by Dr. Jola Baptist.   If she ever needs back surgery, she would like an opinion from Dr. Javier Docker at [facility], [address] [place], [place] [address]. [phone]  Rheumatoid arthritis with positive rheumatoid factor, involving unspecified site ([place]) - Continues on Enbrel, traMADol (ULTRAM) 50 MG tablet, predniSONE (DELTASONE) 5 MG tablet  Hypothyroidism, unspecified hypothyroidism type - TSH is rising. Has appt with Dr. Elyse Hsu next week  Essential hypertension - controlled  Cerumen impaction, bilateral - difficult hearing from right hear   Sciatica of right side associated with disorder of lumbosacral spine - requesting epidural    Medications: Patient's Medications  New Prescriptions   No medications on file  Previous Medications   ACETAMINOPHEN (TYLENOL) 500 MG TABLET    Take 500 mg by mouth every 6 (six) hours as needed for pain. Take two tablets up to four times a day as needed   AMITRIPTYLINE (ELAVIL) 10 MG TABLET    TAKE ONE TABLET BY MOUTH AT BEDTIME TO  HELP  RELAX  MUSCLES   COLESTIPOL (COLESTID) 1 G TABLET    Take 1 tablet (1 g total) by mouth 2 (two) times daily.   DICLOFENAC-MISOPROSTOL 75-0.2 MG TBEC    Take 1 tablet twice daily for arthritis.   DICYCLOMINE (BENTYL) 20 MG TABLET    One every 6 hours as needed to control diarrhea.   DIPHENOXYLATE-ATROPINE (LOMOTIL) 2.5-0.025 MG PER TABLET    Take one tablet by mouth four times daily as needed for diarrhea or loose stools   ENBREL SURECLICK 50 MG/ML INJECTION    Inject 50 mg as directed once a week. Inject once weekly for arthritis   EVISTA 60 MG TABLET    Take one tablet once daily for osteoporosis   FLUTICASONE (FLONASE) 50 MCG/ACT NASAL SPRAY    Place 1 spray into the nose daily.   FOLIC ACID (FOLVITE) 1 MG TABLET    Take 1 mg by mouth daily. Take two tablets every morning.   GLUCOSAMINE-CHONDROIT-VIT C-MN (GLUCOSAMINE CHONDR 500 COMPLEX PO)    Take 500 mg by mouth daily. Take 2 tablets daily for joint health.   HYDROCHLOROTHIAZIDE (MICROZIDE) 12.5 MG CAPSULE    TAKE ONE CAPSULE BY MOUTH ONCE DAILY FOR BLOOD PRESSURE   MULTIPLE VITAMIN (MULTIVITAMIN) TABLET    Take 1 tablet by mouth daily. Take 1 tablet once a daily   MUPIROCIN CREAM (BACTROBAN) 2 %    Apply once daily to the affected area with dressing change   OMEGA-3 FATTY ACIDS (FISH OIL) 1000 MG CAPS    Take 1,000 mg by mouth daily. Take one  tablet once a daily for heart, joint and skin health.   PREDNISONE (DELTASONE) 5 MG TABLET    Take 1 tablet (5 mg total) by mouth daily with breakfast. For arthritis   SACCHAROMYCES BOULARDII (FLORASTOR) 250 MG CAPSULE    Take one tablet by mouth once daily for probiotic   TRAMADOL (ULTRAM) 50 MG TABLET    TAKE ONE TABLET BY MOUTH 4 TIMES DAILY AS NEEDED FOR PAIN   UREA (CARMOL) 20 % CREAM    Apply to callus daily  Modified Medications   No medications on file  Discontinued Medications   No medications on file    Review of Systems  Constitutional: Negative for fever, chills and unexpected weight change.       Regaining weight.  HENT: Negative for congestion, ear discharge, ear pain, hearing loss, sore  throat and tinnitus.   Eyes: Negative for photophobia and pain.  Respiratory: Negative for cough, shortness of breath and wheezing.   Cardiovascular: Negative for chest pain, palpitations and leg swelling.  Gastrointestinal: Positive for diarrhea. Negative for nausea, vomiting, abdominal pain and constipation.  Endocrine: Negative for polydipsia.  Genitourinary: Negative for dysuria, urgency and frequency.  Musculoskeletal: Positive for back pain (Chronic low back discomfort with pain radiating down the right leg). Negative for neck pain.       Diffuse arthralgias. History of rheumatoid arthritis. Currently on Enbrel. Under the care of Dr. Amil Amen. Scheduled for bunionectomy by Dr. Wilhelmina Mcardle 06/04/2015.  Skin: Negative for rash.       callus on heels. Painful corn on the sole of the right foot.  Allergic/Immunologic: Negative for environmental allergies.  Neurological: Negative for dizziness, tremors, seizures, weakness and headaches.  Hematological: Does not bruise/bleed easily.  Psychiatric/Behavioral: Negative for suicidal ideas and hallucinations. The patient is nervous/anxious.     Filed Vitals:   10/06/15 1224  BP: 120/78  Pulse: 76  Temp: 98.5 F (36.9 C)  TempSrc: Oral  Resp: 20  Height: 4' 9"  (1.448 m)  Weight: 109 lb (49.442 kg)  SpO2: 93%   Body mass index is 23.58 kg/(m^2). Filed Weights   10/06/15 1224  Weight: 109 lb (49.442 kg)     Physical Exam  Constitutional: She is oriented to person, place, and time. She appears well-developed and well-nourished.  HENT:  Head: Normocephalic.  Right Ear: External ear normal.  Mouth/Throat: No oropharyngeal exudate.  Wax in both EAC  Eyes: Conjunctivae and EOM are normal. Pupils are equal, round, and reactive to light.  Neck: Neck supple. No tracheal deviation present. Thyromegaly present.  Cardiovascular: Normal rate, regular rhythm, normal heart sounds and intact distal pulses.   Pulmonary/Chest: Effort normal and breath  sounds normal. No stridor.  Abdominal: Soft. Bowel sounds are normal.  Musculoskeletal: She exhibits no edema or tenderness.  Bilateral bunions and right hammer toe. Tender right SI joint. Chronic back discomfort with pain radiating down the right leg.  Lymphadenopathy:    She has no cervical adenopathy.  Neurological: She is oriented to person, place, and time. She has normal reflexes. No cranial nerve deficit. Coordination normal.  Skin: Skin is warm and dry. No rash noted.  Psychiatric: She has a normal mood and affect. Her behavior is normal. Judgment and thought content normal.    Labs reviewed: Lab Summary Latest Ref Rng 10/04/2015 05/24/2015 04/16/2015 04/15/2015 04/14/2015  Hemoglobin 12.0 - 15.0 g/dL (None) (None) 11.1(L) (None) 13.7  Hematocrit 36.0 - 46.0 % (None) (None) 33.3(L) (None) 40.3  White count 4.0 - 10.5  K/uL (None) (None) 8.2 (None) 18.5(H)  Platelet count 150 - 400 K/uL (None) (None) 234 (None) 323  Sodium 134 - 144 mmol/L 140 141 137 138 133(L)  Potassium 3.5 - 5.2 mmol/L 4.1 4.2 3.7 3.6 2.8(L)  Calcium 8.7 - 10.3 mg/dL 8.4(L) 9.3 7.4(L) 7.8(L) 8.6(L)  Phosphorus - (None) (None) (None) (None) (None)  Creatinine 0.57 - 1.00 mg/dL 0.59 0.61 0.53 0.61 0.75  AST 0 - 40 IU/L 13 (None) (None) (None) 25  Alk Phos 39 - 117 IU/L 58 (None) (None) (None) 49  Bilirubin 0.0 - 1.2 mg/dL <0.2 (None) (None) (None) 0.5  Glucose 65 - 99 mg/dL 77 84 101(H) 93 121(H)  Cholesterol - (None) (None) (None) (None) (None)  HDL cholesterol - (None) (None) (None) (None) (None)  Triglycerides - (None) (None) (None) (None) (None)  LDL Direct - (None) (None) (None) (None) (None)  LDL Calc - (None) (None) (None) (None) (None)  Total protein 6.5 - 8.1 g/dL (None) (None) (None) (None) 6.7  Albumin 3.6 - 4.8 g/dL 3.6 (None) (None) (None) 3.6   Lab Results  Component Value Date   TSH 6.380* 10/04/2015   TSH 4.710* 05/24/2015   TSH 1.876 04/16/2015   THYROIDAB 28 12/03/2012   Lab Results    Component Value Date   BUN 18 10/04/2015   BUN 22 05/24/2015   BUN <5* 04/16/2015   No results found for: HGBA1C  Assessment/Plan  1. Rheumatoid arthritis with positive rheumatoid factor, involving unspecified site ([facility]) - traMADol (ULTRAM) 50 MG tablet;   Dispense: 120 tablet; Refill: 0 - predniSONE (DELTASONE) 5 MG tablet; Take 1 tablet (5 mg total) by mouth daily with breakfast. For arthritis  Dispense: 90 tablet; Refill: 1  2. Hypothyroidism, unspecified hypothyroidism type -TSH, future  3. Essential hypertension controlled  4. Cerumen impaction, bilateral Lavaged in office  5. Sciatica of right side associated with disorder of lumbosacral spine Schedule epidural

## 2015-10-07 ENCOUNTER — Telehealth: Payer: Self-pay

## 2015-10-07 NOTE — Telephone Encounter (Signed)
CORTISPORIN-TC is no longer available, please rx an alternative to [facility] in [place]

## 2015-10-11 ENCOUNTER — Other Ambulatory Visit: Payer: Self-pay | Admitting: Internal Medicine

## 2015-10-11 MED ORDER — NEOMYCIN-POLYMYXIN-HC 3.5-10000-1 OT SOLN: OTIC | Status: DC

## 2015-10-11 NOTE — Telephone Encounter (Signed)
Prescription was sent for Cortisporin otic solution

## 2015-11-05 ENCOUNTER — Other Ambulatory Visit: Payer: Self-pay | Admitting: Internal Medicine

## 2015-11-05 ENCOUNTER — Other Ambulatory Visit: Payer: Medicare Other

## 2015-11-05 ENCOUNTER — Ambulatory Visit
Admission: RE | Admit: 2015-11-05 | Discharge: 2015-11-05 | Disposition: A | Discharge status: Home / Self Care | Payer: Medicare Other | Source: Ambulatory Visit | Attending: Internal Medicine | Admitting: Internal Medicine

## 2015-11-05 ENCOUNTER — Encounter: Payer: Self-pay | Admitting: Radiology

## 2015-11-05 DIAGNOSIS — M5387 Other specified dorsopathies, lumbosacral region: Secondary | ICD-10-CM

## 2015-11-05 MED ORDER — IOHEXOL 180 MG/ML  SOLN
1.0000 mL | Freq: Once | INTRAMUSCULAR | Status: AC | PRN
  Administered 2015-11-05: 1 mL via EPIDURAL

## 2015-11-05 MED ORDER — METHYLPREDNISOLONE ACETATE 40 MG/ML INJ SUSP (RADIOLOG
120.0000 mg | Freq: Once | INTRAMUSCULAR | Status: AC
  Administered 2015-11-05: 120 mg via EPIDURAL

## 2015-11-05 NOTE — Discharge Instructions (Signed)

## 2015-11-09 ENCOUNTER — Other Ambulatory Visit: Payer: Self-pay | Admitting: Internal Medicine

## 2015-12-03 ENCOUNTER — Telehealth: Payer: Self-pay | Admitting: Internal Medicine

## 2015-12-03 MED ORDER — METRONIDAZOLE 250 MG PO TABS: 250.0000 mg | ORAL_TABLET | Freq: Three times a day (TID) | ORAL | Status: DC

## 2015-12-03 NOTE — Telephone Encounter (Signed)
Pt aware and script sent to pharmacy. 

## 2015-12-03 NOTE — Telephone Encounter (Signed)
Pt states she is having diarrhea again. Pt thinks that her thyroid medication may be causing the diarrhea. She is taking florastor, colistipol, and lomotil. Pt states she was seen by Cecille Rubin Hvozdovic, PA-C and given flagyl. Pt thinks this helped with her diarrhea and wants to know if she can try this again. Please advise.

## 2015-12-03 NOTE — Telephone Encounter (Signed)
May try repeat course of metronidazole for 1 week

## 2015-12-06 ENCOUNTER — Telehealth: Payer: Self-pay | Admitting: Internal Medicine

## 2015-12-06 NOTE — Telephone Encounter (Signed)
Pt states she is still having diarrhea. Discussed with pt that she has only been on Flagyl for a couple of days and that she needed to continue the med and give it a little more time to work. Discussed foods that pt could eat and confirmed that she is still taking the colestipol, florastor,and lomotil. Pt to call back later in the week with an update.

## 2015-12-10 ENCOUNTER — Telehealth: Payer: Self-pay

## 2015-12-10 MED ORDER — ONDANSETRON HCL 4 MG PO TABS: ORAL_TABLET | ORAL | Status: DC

## 2015-12-10 NOTE — Telephone Encounter (Signed)
Prescribe ondansetron 4 mg every 6 hrs prn - in the sig add "as directed by MD"  Explain it is generally intended for nausea and vomiting but can help diarrhea especially in IBS which is likely in her  Disp # 60 no RF

## 2015-12-10 NOTE — Telephone Encounter (Signed)
Spoke with pt and she is aware, script sent to pharmacy. 

## 2015-12-10 NOTE — Telephone Encounter (Signed)
Joan [name] pt calling with an update on her diarrhea. Pt called 12/03/15 and stated that she has been having diarrhea again. Pt reported that she was taking lomotil and colestipol and still having diarrhea. Per Dr. Henrene [name] pt was placed on Flagyl 250mg  tid for 10days. Pt has been taking florastor along with the antibiotic and the colestipol and lomotil. Pt has been following the brat diet and reports she is still having diarrhea and she states she is very weak. Pt wants to know if there is something else she can try for the diarrhea. Dr. Carlean Purl please advise as doc of the day.

## 2015-12-13 ENCOUNTER — Telehealth: Payer: Self-pay | Admitting: Internal Medicine

## 2015-12-13 NOTE — Telephone Encounter (Signed)
Pt scheduled to see Dr. Henrene Pastor 12/15/15@1 :30pm. Left message for pt to call back.

## 2015-12-13 NOTE — Telephone Encounter (Signed)
Pt states she continues to have diarrhea and wants to see Dr. Henrene Pastor. Pt scheduled to see Dr. Henrene Pastor 12/15/15@1 :30pm. Pt aware of appt.

## 2015-12-15 ENCOUNTER — Encounter: Payer: Self-pay | Admitting: Internal Medicine

## 2015-12-15 ENCOUNTER — Ambulatory Visit (INDEPENDENT_AMBULATORY_CARE_PROVIDER_SITE_OTHER): Payer: Medicare Other | Admitting: Internal Medicine

## 2015-12-15 VITALS — BP 106/58 | HR 84 | Ht <= 58 in | Wt 103.5 lb

## 2015-12-15 DIAGNOSIS — R197 Diarrhea, unspecified: Secondary | ICD-10-CM | POA: Diagnosis not present

## 2015-12-15 MED ORDER — RIFAXIMIN 550 MG PO TABS: 550.0000 mg | ORAL_TABLET | Freq: Three times a day (TID) | ORAL | Status: DC

## 2015-12-15 MED ORDER — DIPHENOXYLATE-ATROPINE 2.5-0.025 MG PO TABS: ORAL_TABLET | ORAL | Status: DC

## 2015-12-15 MED ORDER — DICYCLOMINE HCL 10 MG PO CAPS: 10.0000 mg | ORAL_CAPSULE | Freq: Three times a day (TID) | ORAL | Status: DC

## 2015-12-15 NOTE — Progress Notes (Signed)
HISTORY OF PRESENT ILLNESS:  Joan [name] is a 71 y.o. female with past medical history as listed below. She presents today accompanied by her husband with chief complaint of diarrhea. Patient was initially evaluated in this office in early August with 2 month history of diarrhea. Stool studies were negative. She was empirically treated with metronidazole, probiotic, and Colestid. She was hospitalized 10 days later for ongoing diarrhea with dehydration. A CT scan suggested thickening of the sigmoid colon. Colonoscopy revealed severe diverticulosis but was otherwise unremarkable including intubation of the terminal ileum (Dr. Carlean Purl). Random colon biopsies were consistent with acute self-limited infectious colitis. She was discharged home on empiric dicyclomine and Lomotil. She was seen in follow-up 10 days post hospitalization with resolution of diarrhea. Subsequent follow-up with me 06/23/2015. At that time, she was asymptomatic off all medications. Follow-up as needed recommended. Patient reports recurrence of her diarrhea proximal to 6 weeks ago. She states that this coincided with reinitiation of thyroid medication. However despite being off thyroid medication for approximate 3 weeks her diarrhea continues. She contacted this office 12 days ago regarding diarrhea. She requested empiric metronidazole. This was prescribed and has been completed. No improvement. She also resumed Colestid twice daily, Bentyl as needed, and Lomotil 3-4 times daily as needed. She describes watery or soft stools postprandially after hyperactive bowel sounds. No pain or bleeding. No steatorrhea. She reports weight loss, though her weight is up 1 pound from last October. She sees her endocrinologist on Monday. No other symptoms. No new medications. She continues on low-dose prednisone. Currently 5 mg every other day. She and her husband have innumerable questions and concerns.  REVIEW OF SYSTEMS:  All non-GI ROS negative except  for arthritis, back pain, fibromyalgia  Past Medical History  Diagnosis Date  . Hypothyroidism   . Impacted cerumen     right ear  . Thyrotoxicosis   . Unspecified vitamin D deficiency   . Nontoxic uninodular goiter   . Other left bundle branch block   . External hemorrhoids without mention of complication   . Alopecia areata 02/16/2006  . Chronic rhinitis 09/01/05  . Myalgia and myositis, unspecified 03/01/05  . Rheumatoid arthritis(714.0) 10/05/2004    lumbar spondylosis. Lumbo-sacral anterolisthesis    . Dysphagia, unspecified(787.20) 2004  . Anxiety state, unspecified 2004  . Benign paroxysmal positional vertigo 04/01/03  . 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 Osteoarthrosis, unspecified whether generalized or localized, unspecified site 04/01/03  . Cervicalgia 04/01/03  . Senile osteoporosis 04/01/03  . Hypertension 2004  . Enthesopathy of ankle and tarsus, unspecified 03/31/96  . Corns and callosities   . Dermatophytosis of nail   . Thyrotoxicosis without mention of goiter or other cause, without mention of thyrotoxic crisis or storm   . Lumbago   . Urinary frequency   . Sprain of metatarsophalangeal (joint) of foot   . Fibromyalgia   . Diarrhea 04/15/2015  . Diverticulosis     Past Surgical History  Procedure Laterality Date  . Tubal ligation  1973  . Tonsillectomy and adenoidectomy  1974  . Right wrist sugery for tenosynovitis  1997    DR SYPHER   . Remove ganglion  1998    DR SYPHER   . Foot surgery Right 05/22/2014    Dr.Strom   . Colonoscopy    . Colonoscopy N/A 04/15/2015    Procedure: COLONOSCOPY;  Surgeon: Gatha Mayer, MD;  Location: [facility];  Service: Endoscopy;  Laterality: N/A;    Social History Joan [name]  reports that she has never smoked. She has never used smokeless tobacco. She reports that she does not drink alcohol or use illicit drugs.  family history includes Heart attack in her father; Heart disease in her father.  Allergies   Allergen Reactions  . Arava [Leflunomide] Diarrhea, Nausea Only and Other (See Comments)    Loss appetite  . Methotrexate Derivatives Other (See Comments)    Hair loss  . Neurontin [Gabapentin] Nausea And Vomiting  . Penicillins Rash       PHYSICAL EXAMINATION: Vital signs: BP 106/58 mmHg  Pulse 84  Ht 4' 8.75" (1.441 m)  Wt 103 lb 8 oz (46.947 kg)  BMI 22.61 kg/m2  Constitutional: generally well-appearing, no acute distress Psychiatric: alert and oriented x3, cooperative. Anxious Eyes: extraocular movements intact, anicteric, conjunctiva pink Mouth: oral pharynx moist, no lesions Neck: supple without thyromegaly Lymph: Slight fullness with slight tenderness in the left supraclavicular region. ( Patient brought this to my attention after being told by a PA that this was secondary to diarrhea). Cardiovascular: heart regular rate and rhythm, no murmur Lungs: clear to auscultation bilaterally Abdomen: soft, nontender, nondistended, no obvious ascites, no peritoneal signs, normal bowel sounds, no organomegaly Rectal: Omitted Extremities: no clubbing cyanosis or lower extremity edema bilaterally Skin: no lesions on visible extremities Neuro: No focal deficits. Normal DTRs. No asterixis.    ASSESSMENT:  1. Recurrent diarrhea as described. 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 Colonoscopy last August as described 2. Anxiety   PLAN:  1. Advised to take dicyclomine 20 mg before meals. Prescription refilled 2. Advised to take Lomotil 4 times daily. Hold for constipation. Refilled 3. Prescribed Xifaxan 550 mg 3 times a day 14 days 4. Advised to eat and drink plenty to avoid dehydration 5. GI Office follow-up 4 weeks 6. See PCP regarding slight supraclavicular tenderness and fullness if persists   40 minutes was spent face-to-face with the patient. Greater than 50% a time used for counseling regarding her diarrhea, the plan as outlined, and answering multiple questions from her and her  husband

## 2015-12-15 NOTE — Patient Instructions (Signed)
We have sent the following medications to your pharmacy for you to pick up at your convenience:   Bentyl, Lomotil, Xifaxan  Please follow up on 01/24/2016 at 2:45

## 2015-12-16 ENCOUNTER — Telehealth: Payer: Self-pay | Admitting: Internal Medicine

## 2015-12-16 NOTE — Telephone Encounter (Signed)
Spoke with patient and she wanted to know if she should continue Florastor and Colestipol. Told her to continue all routine medications. Lomotil is as needed QID.

## 2015-12-22 ENCOUNTER — Telehealth: Payer: Self-pay | Admitting: Internal Medicine

## 2015-12-23 NOTE — Telephone Encounter (Signed)
Pt said she is still having diarrhea and wants to discuss the meds

## 2015-12-23 NOTE — Telephone Encounter (Signed)
Magda Paganini do you know about xifaxan for this pt?

## 2015-12-24 NOTE — Telephone Encounter (Signed)
Patient calling back regarding this. She also has questions on what medications she still needs to be taking.

## 2015-12-24 NOTE — Telephone Encounter (Signed)
Called [facility], spoke with Joan [name] egarding the delay in [place] and was told they had been trying to reach her to get insurance information.  Spoke with patient who said she had already been contacted and gave them that information last week.  Called [facility] again who said she could not see where this interaction had taken place but would call patient back right now to verify.

## 2015-12-27 ENCOUNTER — Telehealth: Payer: Self-pay | Admitting: Internal Medicine

## 2015-12-27 NOTE — Telephone Encounter (Signed)
Can stop probiotic, continue other

## 2015-12-27 NOTE — Telephone Encounter (Signed)
Spoke with pt and she is aware.

## 2015-12-27 NOTE — Telephone Encounter (Signed)
Pt received call from [facility] pharmacy regarding her script for xifaxan. Pt wants to know if Dr. Henrene Pastor wanted her to continue taking the colestipol and the probiotic along with the xifaxan. Please advise.

## 2015-12-28 ENCOUNTER — Other Ambulatory Visit: Payer: Self-pay | Admitting: Internal Medicine

## 2016-01-24 ENCOUNTER — Other Ambulatory Visit: Payer: Self-pay

## 2016-01-24 ENCOUNTER — Ambulatory Visit (INDEPENDENT_AMBULATORY_CARE_PROVIDER_SITE_OTHER): Payer: Medicare Other | Admitting: Internal Medicine

## 2016-01-24 ENCOUNTER — Ambulatory Visit: Payer: Medicare Other | Admitting: Internal Medicine

## 2016-01-24 ENCOUNTER — Encounter: Payer: Self-pay | Admitting: Internal Medicine

## 2016-01-24 VITALS — BP 122/74 | HR 72 | Ht <= 58 in | Wt 103.2 lb

## 2016-01-24 DIAGNOSIS — R197 Diarrhea, unspecified: Secondary | ICD-10-CM

## 2016-01-24 DIAGNOSIS — Z1231 Encounter for screening mammogram for malignant neoplasm of breast: Secondary | ICD-10-CM

## 2016-01-24 MED ORDER — DICYCLOMINE HCL 10 MG PO CAPS: 10.0000 mg | ORAL_CAPSULE | Freq: Three times a day (TID) | ORAL | Status: DC

## 2016-01-24 MED ORDER — DIPHENOXYLATE-ATROPINE 2.5-0.025 MG PO TABS: ORAL_TABLET | ORAL | Status: DC

## 2016-01-24 NOTE — Patient Instructions (Signed)
We have sent the following medications to your pharmacy for you to pick up at your convenience:  Bentyl, Lomotil  Please follow up in three months

## 2016-01-24 NOTE — Progress Notes (Signed)
HISTORY OF PRESENT ILLNESS:  Joan [name] is a 70 y.o. female with past medical history as listed below. She was last evaluated 12/15/2015 for recurrent diarrhea. See that dictation for details. She was felt possibly to have postinfectious IBS. In addition anxiety. She was advised to take dicyclomine before meals and Lomotil up to 4 times daily. She did complete a two-week course of Xifaxan. Schedule follow-up at this time. She is accompanied by her husband. They both reports significant improvement in diarrhea. Greater than 50%. No nocturnal component. Less urgency. No accidents. She is using dicyclomine often but not always. Lomotil twice daily. She has been on Arthrotec for some time. I advised her that misoprostol can cause diarrhea. No new complaints. Multiple questions again.  REVIEW OF SYSTEMS:  All non-GI ROS negative except for arthritis, back pain,  Past Medical History  Diagnosis Date  . Hypothyroidism   . Impacted cerumen     right ear  . Thyrotoxicosis   . Unspecified vitamin D deficiency   . Nontoxic uninodular goiter   . Other left bundle branch block   . External hemorrhoids without mention of complication   . Alopecia areata 02/16/2006  . Chronic rhinitis 09/01/05  . Myalgia and myositis, unspecified 03/01/05  . Rheumatoid arthritis(714.0) 10/05/2004    lumbar spondylosis. Lumbo-sacral anterolisthesis    . Dysphagia, unspecified(787.20) 2004  . Anxiety state, unspecified 2004  . Benign paroxysmal positional vertigo 04/01/03  . 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 Osteoarthrosis, unspecified whether generalized or localized, unspecified site 04/01/03  . Cervicalgia 04/01/03  . Senile osteoporosis 04/01/03  . Hypertension 2004  . Enthesopathy of ankle and tarsus, unspecified 03/31/96  . Corns and callosities   . Dermatophytosis of nail   . Thyrotoxicosis without mention of goiter or other cause, without mention of thyrotoxic crisis or storm   . Lumbago   . Urinary frequency    . Sprain of metatarsophalangeal (joint) of foot   . Fibromyalgia   . Diarrhea 04/15/2015  . Diverticulosis     Past Surgical History  Procedure Laterality Date  . Tubal ligation  1973  . Tonsillectomy and adenoidectomy  1974  . Right wrist sugery for tenosynovitis  1997    DR SYPHER   . Remove ganglion  1998    DR SYPHER   . Foot surgery Right 05/22/2014    Dr.Strom   . Colonoscopy    . Colonoscopy N/A 04/15/2015    Procedure: COLONOSCOPY;  Surgeon: Gatha Mayer, MD;  Location: [facility];  Service: Endoscopy;  Laterality: N/A;    Social History Joan [name]  reports that she has never smoked. She has never used smokeless tobacco. She reports that she does not drink alcohol or use illicit drugs.  family history includes Heart attack in her father; Heart disease in her father.  Allergies  Allergen Reactions  . Arava [Leflunomide] Diarrhea, Nausea Only and Other (See Comments)    Loss appetite  . Methotrexate Derivatives Other (See Comments)    Hair loss  . Neurontin [Gabapentin] Nausea And Vomiting  . Penicillins Rash       PHYSICAL EXAMINATION: Vital signs: BP 122/74 mmHg  Pulse 72  Ht 4\' 9"  (1.448 m)  Wt 103 lb 4 oz (46.834 kg)  BMI 22.34 kg/m2 General: Well-developed, well-nourished, no acute distress HEENT: Sclerae are anicteric, conjunctiva pink. Oral mucosa intact Lungs: Clear Heart: Regular Abdomen: soft, nontender, nondistended, no obvious ascites, no peritoneal signs, normal bowel sounds. No organomegaly. Extremities: NoClubbing cyanosis  or edema. Multiple joint arthritis deformities of the hands Psychiatric: alert and oriented x3. Cooperative   ASSESSMENT:  #1. Recurrent diarrhea. Question postinfectious IBS. Question medication related. Improved  #2. Anxiety. Ongoing  PLAN:  #1. Continue dicyclomine. Refilled #2. Continue Lomotil. Refilled. Increase if needed #3. Talk to her PCP regarding Arthrotec. She could have NSAID and PPI as an  alternative and this may help her bowels while allowing treatment for arthritis and gastric mucosal protection #4. Routine office follow-up in 3 months  #5. Continue routine general medical care with Dr. Nyoka Cowden  25 minutes was spent face-to-face with the patient. Greater than 50% a time was used for counseling regarding her diarrhea and its management and answering her and her husband's questions.

## 2016-01-24 NOTE — Addendum Note (Signed)
Addended by: Rafael Bihari A on: 01/24/2016 10:41 AM   Modules accepted: Orders

## 2016-01-31 ENCOUNTER — Other Ambulatory Visit: Payer: Self-pay | Admitting: Internal Medicine

## 2016-02-02 ENCOUNTER — Telehealth: Payer: Self-pay

## 2016-02-02 ENCOUNTER — Other Ambulatory Visit: Payer: Medicare Other

## 2016-02-02 DIAGNOSIS — I10 Essential (primary) hypertension: Secondary | ICD-10-CM

## 2016-02-02 DIAGNOSIS — M059 Rheumatoid arthritis with rheumatoid factor, unspecified: Secondary | ICD-10-CM

## 2016-02-02 DIAGNOSIS — E039 Hypothyroidism, unspecified: Secondary | ICD-10-CM

## 2016-02-02 NOTE — Telephone Encounter (Signed)
Prior authorization was received for amitriptyline 10 mg tablets. Take one tablet by mouth at bedtime to help relax muscles. #90 No RF  Prior authorization was initiated via [email or web address] Keyword: [id number] DOB: 12/16/1945 Last name; Warman.   Awaiting determination.

## 2016-02-02 NOTE — Telephone Encounter (Signed)
Authorization was approved for amitriptyline 10 mg tablets until 09/03/2016.  A message was left on the voicemail for [facility] [phone], explaining that the medication has been approved.   A voicemail message was left for patient explaining that the medication has been approved.

## 2016-02-03 LAB — COMPREHENSIVE METABOLIC PANEL
A/G RATIO: 1.3 (ref 1.2–2.2)
ALT: 9 IU/L (ref 0–32)
AST: 13 IU/L (ref 0–40)
Albumin: 3.5 g/dL — ABNORMAL LOW (ref 3.6–4.8)
Alkaline Phosphatase: 57 IU/L (ref 39–117)
BUN/Creatinine Ratio: 20 (ref 12–28)
BUN: 13 mg/dL (ref 8–27)
Bilirubin Total: 0.2 mg/dL (ref 0.0–1.2)
CALCIUM: 8.6 mg/dL — AB (ref 8.7–10.3)
CHLORIDE: 100 mmol/L (ref 96–106)
CO2: 28 mmol/L (ref 18–29)
Creatinine, Ser: 0.66 mg/dL (ref 0.57–1.00)
GFR calc Af Amer: 105 mL/min/{1.73_m2} (ref >59)
GFR, EST NON AFRICAN AMERICAN: 91 mL/min/{1.73_m2} (ref >59)
Globulin, Total: 2.8 g/dL (ref 1.5–4.5)
Glucose: 81 mg/dL (ref 65–99)
POTASSIUM: 4.1 mmol/L (ref 3.5–5.2)
Sodium: 143 mmol/L (ref 134–144)
TOTAL PROTEIN: 6.3 g/dL (ref 6.0–8.5)

## 2016-02-03 LAB — CBC WITH DIFFERENTIAL
BASOS: 0 %
Basophils Absolute: 0 10*3/uL (ref 0.0–0.2)
EOS (ABSOLUTE): 0.2 10*3/uL (ref 0.0–0.4)
EOS: 2 %
HEMATOCRIT: 39.9 % (ref 34.0–46.6)
Hemoglobin: 13 g/dL (ref 11.1–15.9)
IMMATURE GRANS (ABS): 0 10*3/uL (ref 0.0–0.1)
Immature Granulocytes: 0 %
LYMPHS: 35 %
Lymphocytes Absolute: 2.7 10*3/uL (ref 0.7–3.1)
MCH: 30.4 pg (ref 26.6–33.0)
MCHC: 32.6 g/dL (ref 31.5–35.7)
MCV: 93 fL (ref 79–97)
MONOCYTES: 21 %
Monocytes Absolute: 1.7 10*3/uL — ABNORMAL HIGH (ref 0.1–0.9)
NEUTROS ABS: 3.2 10*3/uL (ref 1.4–7.0)
NEUTROS PCT: 42 %
RBC: 4.28 x10E6/uL (ref 3.77–5.28)
RDW: 13.9 % (ref 12.3–15.4)
WBC: 7.7 10*3/uL (ref 3.4–10.8)

## 2016-02-03 LAB — T4, FREE: FREE T4: 0.94 ng/dL (ref 0.82–1.77)

## 2016-02-03 LAB — T3, FREE: T3 FREE: 2.9 pg/mL (ref 2.0–4.4)

## 2016-02-03 LAB — TSH: TSH: 6.03 u[IU]/mL — ABNORMAL HIGH (ref 0.450–4.500)

## 2016-02-04 ENCOUNTER — Other Ambulatory Visit: Payer: Medicare Other

## 2016-02-08 ENCOUNTER — Encounter: Payer: Self-pay | Admitting: Internal Medicine

## 2016-02-08 ENCOUNTER — Ambulatory Visit: Payer: Medicare Other

## 2016-02-08 ENCOUNTER — Ambulatory Visit
Admission: RE | Admit: 2016-02-08 | Discharge: 2016-02-08 | Disposition: A | Discharge status: Home / Self Care | Payer: Medicare Other | Source: Ambulatory Visit

## 2016-02-08 ENCOUNTER — Ambulatory Visit (INDEPENDENT_AMBULATORY_CARE_PROVIDER_SITE_OTHER): Payer: Medicare Other | Admitting: Internal Medicine

## 2016-02-08 VITALS — BP 116/70 | HR 70 | Temp 98.4°F | Ht <= 58 in | Wt 100.0 lb

## 2016-02-08 DIAGNOSIS — M5431 Sciatica, right side: Secondary | ICD-10-CM | POA: Diagnosis not present

## 2016-02-08 DIAGNOSIS — I10 Essential (primary) hypertension: Secondary | ICD-10-CM

## 2016-02-08 DIAGNOSIS — M059 Rheumatoid arthritis with rheumatoid factor, unspecified: Secondary | ICD-10-CM | POA: Diagnosis not present

## 2016-02-08 DIAGNOSIS — R197 Diarrhea, unspecified: Secondary | ICD-10-CM

## 2016-02-08 DIAGNOSIS — E0590 Thyrotoxicosis, unspecified without thyrotoxic crisis or storm: Secondary | ICD-10-CM

## 2016-02-08 DIAGNOSIS — Z1231 Encounter for screening mammogram for malignant neoplasm of breast: Secondary | ICD-10-CM

## 2016-02-08 DIAGNOSIS — E039 Hypothyroidism, unspecified: Secondary | ICD-10-CM

## 2016-02-08 DIAGNOSIS — M5387 Other specified dorsopathies, lumbosacral region: Secondary | ICD-10-CM

## 2016-02-08 MED ORDER — DICLOFENAC SODIUM 75 MG PO TBEC: DELAYED_RELEASE_TABLET | ORAL | Status: DC

## 2016-02-08 NOTE — Progress Notes (Signed)
Patient ID: Joan [name], female   DOB: 03-03-1946, 70 y.o.   MRN: [id number]    Facility  [facility]    Place of Service:   OFFICE    Allergies  Allergen Reactions  . Arava [Leflunomide] Diarrhea, Nausea Only and Other (See Comments)    Loss appetite  . Methotrexate Derivatives Other (See Comments)    Hair loss  . Neurontin [Gabapentin] Nausea And Vomiting  . Penicillins Rash    Chief Complaint  Patient presents with  . Medical Management of Chronic Issues    4 month medication management blood pressure, thyroid, Rheumatoid arthritis.     HPI:  Seen by Dr. Scarlette Shorts 01/24/2016. She completed a two-week course of Xifaxan as guidance, and then was put on dicyclomine before meals and Lomotil up to 4 times daily. Diarrhea improved with this. He would like her to stop the Arthrotec which contains Misoprostil which can be related to diarrhea.  He continues to see Dr. Amil Amen about her rheumatoid arthritis.  Sees Dr. Elyse Hsu about her thyroid. Now post ablative radiation therapyfr thyrotoxicosis noted 2014. Last TSH was 6.01 on 02/02/16.  Back hurts al the time. Pain goes down the right leg. She wants to see Dr. Megan Salon., in [place], [place]. She has seen Dr. Arnoldo Morale in [place], but would like another opinion and see what Dr. Megan Salon thinks about additional surgery.  Medications: Patient's Medications  New Prescriptions   No medications on file  Previous Medications   ACETAMINOPHEN (TYLENOL) 500 MG TABLET    Take 500 mg by mouth every 6 (six) hours as needed for pain. Take two tablets up to four times a day as needed   AMITRIPTYLINE (ELAVIL) 10 MG TABLET    TAKE ONE TABLET BY MOUTH AT BEDTIME TO HELP RELAX MUSCLES   COLESTIPOL (COLESTID) 1 G TABLET    Take 1 tablet (1 g total) by mouth 2 (two) times daily.   DICLOFENAC-MISOPROSTOL 75-0.2 MG TBEC    Take 1 tablet twice daily for arthritis.   DICYCLOMINE (BENTYL) 10 MG CAPSULE    Take 1 capsule (10 mg total) by mouth 3 (three) times  daily before meals.   DICYCLOMINE (BENTYL) 20 MG TABLET    One every 6 hours as needed to control diarrhea.   DIPHENOXYLATE-ATROPINE (LOMOTIL) 2.5-0.025 MG TABLET    TAKE ONE TABLET BY MOUTH 4 TIMES DAILY AS NEEDED FOR DIARRHEA OR  LOOSE  STOOLS.   ENBREL SURECLICK 50 MG/ML INJECTION    Inject 50 mg as directed once a week. Inject once weekly for arthritis   EVISTA 60 MG TABLET    Take one tablet once daily for osteoporosis   FLUTICASONE (FLONASE) 50 MCG/ACT NASAL SPRAY    Place 1 spray into the nose daily.   FOLIC ACID (FOLVITE) 1 MG TABLET    Take 1 mg by mouth daily. Take two tablets every morning.   GLUCOSAMINE-CHONDROIT-VIT C-MN (GLUCOSAMINE CHONDR 500 COMPLEX PO)    Take 500 mg by mouth daily. Take 2 tablets daily for joint health.   HYDROCHLOROTHIAZIDE (MICROZIDE) 12.5 MG CAPSULE    TAKE ONE CAPSULE BY MOUTH ONCE DAILY FOR BLOOD PRESSURE   MULTIPLE VITAMIN (MULTIVITAMIN) TABLET    Take 1 tablet by mouth daily. Take 1 tablet once a daily   NEOMYCIN-POLYMYXIN-HYDROCORTISONE (CORTISPORIN) OTIC SOLUTION    Place 2 drops in affected ear 4 times daily for infection   OMEGA-3 FATTY ACIDS (FISH OIL) 1000 MG CAPS    Take 1,000 mg by mouth daily.  Take one tablet once a daily for heart, joint and skin health.   ONDANSETRON (ZOFRAN) 4 MG TABLET    Take every 6 hours as needed as directed by MD   PREDNISONE (DELTASONE) 5 MG TABLET    Take 5 mg by mouth. Take 1/2 alt with 1 tablet by mouth daily with breakfast for arthritis   RIFAXIMIN (XIFAXAN) 550 MG TABS TABLET    Take 1 tablet (550 mg total) by mouth 3 (three) times daily.   SACCHAROMYCES BOULARDII (FLORASTOR) 250 MG CAPSULE    Take one tablet by mouth once daily for probiotic   TRAMADOL (ULTRAM) 50 MG TABLET    Take one or 2 up to 4 times in 24 hours for pain   UREA (CARMOL) 20 % CREAM    Apply to callus daily  Modified Medications   No medications on file  Discontinued Medications   MUPIROCIN CREAM (BACTROBAN) 2 %    Apply once daily to the  affected area with dressing change   PREDNISONE (DELTASONE) 5 MG TABLET    Take 1 tablet (5 mg total) by mouth daily with breakfast. For arthritis    Review of Systems  Constitutional: Negative for fever, chills and unexpected weight change.  HENT: Negative for congestion, ear discharge, ear pain, hearing loss, sore throat and tinnitus.   Eyes: Negative for photophobia and pain.  Respiratory: Negative for cough, shortness of breath and wheezing.   Cardiovascular: Negative for chest pain, palpitations and leg swelling.  Gastrointestinal: Positive for diarrhea. Negative for nausea, vomiting, abdominal pain and constipation.  Endocrine: Negative for polydipsia.  Genitourinary: Negative for dysuria, urgency and frequency.  Musculoskeletal: Positive for back pain (Chronic low back discomfort with pain radiating down the right leg). Negative for neck pain.       Diffuse arthralgias. History of rheumatoid arthritis. Currently on Enbrel. Under the care of Dr. Amil Amen. Scheduled for bunionectomy by Dr. Wilhelmina Mcardle 06/04/2015.  Skin: Negative for rash.       callus on heels.  Allergic/Immunologic: Negative for environmental allergies.  Neurological: Negative for dizziness, tremors, seizures, weakness and headaches.  Hematological: Does not bruise/bleed easily.  Psychiatric/Behavioral: Negative for suicidal ideas and hallucinations. The patient is nervous/anxious.     Filed Vitals:   02/08/16 1422  BP: 116/70  Pulse: 70  Temp: 98.4 F (36.9 C)  TempSrc: Oral  Height: 4' 9"  (1.448 m)  Weight: 100 lb (45.36 kg)  SpO2: 97%   Body mass index is 21.63 kg/(m^2). Filed Weights   02/08/16 1422  Weight: 100 lb (45.36 kg)     Physical Exam  Constitutional: She is oriented to person, place, and time. She appears well-developed and well-nourished.  HENT:  Head: Normocephalic.  Right Ear: External ear normal.  Mouth/Throat: No oropharyngeal exudate.  Eyes: Conjunctivae and EOM are normal. Pupils  are equal, round, and reactive to light.  Neck: Neck supple. No tracheal deviation present. Thyromegaly present.  Cardiovascular: Normal rate, regular rhythm, normal heart sounds and intact distal pulses.   Pulmonary/Chest: Effort normal and breath sounds normal. No stridor.  Abdominal: Soft. Bowel sounds are normal.  Musculoskeletal: She exhibits no edema or tenderness.  Bilateral bunions and right hammer toe. Tender right SI joint. Chronic back discomfort with pain radiating down the right leg.  Lymphadenopathy:    She has no cervical adenopathy.  Neurological: She is oriented to person, place, and time. She has normal reflexes. No cranial nerve deficit. Coordination normal.  Skin: Skin is warm and dry. No  rash noted.  Psychiatric: She has a normal mood and affect. Her behavior is normal. Judgment and thought content normal.    Labs reviewed: Lab Summary Latest Ref Rng 02/02/2016 10/04/2015 05/24/2015  Hemoglobin 11.1 - 15.9 g/dL 13.0 (None) (None)  Hematocrit 34.0 - 46.6 % 39.9 (None) (None)  White count 3.4 - 10.8 x10E3/uL 7.7 (None) (None)  Platelet count - (None) (None) (None)  Sodium 134 - 144 mmol/L 143 140 141  Potassium 3.5 - 5.2 mmol/L 4.1 4.1 4.2  Calcium 8.7 - 10.3 mg/dL 8.6(L) 8.4(L) 9.3  Phosphorus - (None) (None) (None)  Creatinine 0.57 - 1.00 mg/dL 0.66 0.59 0.61  AST 0 - 40 IU/L 13 13 (None)  Alk Phos 39 - 117 IU/L 57 58 (None)  Bilirubin 0.0 - 1.2 mg/dL 0.2 <0.2 (None)  Glucose 65 - 99 mg/dL 81 77 84  Cholesterol - (None) (None) (None)  HDL cholesterol - (None) (None) (None)  Triglycerides - (None) (None) (None)  LDL Direct - (None) (None) (None)  LDL Calc - (None) (None) (None)  Total protein - (None) (None) (None)  Albumin 3.6 - 4.8 g/dL 3.5(L) 3.6 (None)   Lab Results  Component Value Date   TSH 6.030* 02/02/2016   TSH 6.380* 10/04/2015   TSH 4.710* 05/24/2015   THYROIDAB 28 12/03/2012   Lab Results  Component Value Date   BUN 13 02/02/2016   BUN 18  10/04/2015   BUN 22 05/24/2015   No results found for: HGBA1C  Assessment/Plan  1. Essential hypertension controlled  2. Diarrhea, unspecified type Discontinued Arthrotec which contains misoprostol  3. Hypothyroidism, unspecified hypothyroidism type Continue to observe  4. Rheumatoid arthritis with positive rheumatoid factor, involving unspecified site ([place]) -Discontinued Arthrotec because the misoprostol may cause diarrhea. - diclofenac (VOLTAREN) 75 MG EC tablet; One twice daily to help arthritis  Dispense: 60 tablet; Refill: 5  5. Thyrotoxicosis without thyroid storm, unspecified thyrotoxicosis type Resolved. Patient is now status post ablation radiotherapy. She is trending towards hypothyroidism.  6. Sciatica of right side associated with disorder of lumbosacral spine -Patient states the injections in her back or not helping lactate in the past. She states that she was told that she needs another MRI before more injections are done. - MR Lumbar Spine Wo Contrast; Future - Ambulatory referral to Neurosurgery. Dr. Megan Salon in [facility]

## 2016-02-21 ENCOUNTER — Other Ambulatory Visit: Payer: Medicare Other

## 2016-02-28 ENCOUNTER — Ambulatory Visit
Admission: RE | Admit: 2016-02-28 | Discharge: 2016-02-28 | Disposition: A | Discharge status: Home / Self Care | Payer: Medicare Other | Source: Ambulatory Visit | Attending: Internal Medicine | Admitting: Internal Medicine

## 2016-02-28 DIAGNOSIS — M5387 Other specified dorsopathies, lumbosacral region: Secondary | ICD-10-CM

## 2016-02-29 ENCOUNTER — Other Ambulatory Visit: Payer: Self-pay | Admitting: Internal Medicine

## 2016-03-08 ENCOUNTER — Other Ambulatory Visit: Payer: Self-pay | Admitting: *Deleted

## 2016-03-08 ENCOUNTER — Telehealth: Payer: Self-pay | Admitting: *Deleted

## 2016-03-08 MED ORDER — NABUMETONE 500 MG PO TABS: 500.0000 mg | ORAL_TABLET | Freq: Two times a day (BID) | ORAL | Status: DC

## 2016-03-08 NOTE — Telephone Encounter (Signed)
Arthrotec was discontinued 02/08/2016. It was substituted with diclofenac. Arthrotec is a combination drug that includes diclofenac and misoprostol. The only change in her medication was that the misoprostol was removed because of its tendency to cause diarrhea. Otherwise, she is on the same medication. Perhaps we should stop the diclofenac and substitute Relafen 500 mg twice daily.

## 2016-03-08 NOTE — Telephone Encounter (Signed)
Patient called regarding new medication that she started approximately 1 month ago, she stated that she is having dizziness, depressed, weak and crazy at times. She stated that she didn't have these reactions with the prior medication(Arthotec), and would like to go back to it. She stated that she is still having the diarrhea every morning,no change in that area.  She appreciates that you have tried to change her medication, but this is making her feel bad and would like to go back to her original medication. Please Advise!

## 2016-03-08 NOTE — Telephone Encounter (Signed)
Spoke with patient regarding this medication change , she stated that she would be will to try this medication( Relafen 500 mg BID) and she would check with the pharmacist regarding any reactions this medication may cause.

## 2016-03-13 ENCOUNTER — Telehealth: Payer: Self-pay | Admitting: *Deleted

## 2016-03-13 NOTE — Telephone Encounter (Signed)
Patient called and stated that she was started on Nabumetone 500mg  and it is making her feel weak, dizzy and out of breath and still having diarrhea. Please Advise.

## 2016-03-13 NOTE — Telephone Encounter (Signed)
Stop the Relafen. Try Aleve 220 mg twice daily. She may want to discuss her medications with heer rheumatologist.

## 2016-03-13 NOTE — Telephone Encounter (Signed)
Patient notified and agreed and will call her Rheumatologist.

## 2016-03-13 NOTE — Telephone Encounter (Signed)
LMOM to return call.

## 2016-03-13 NOTE — Telephone Encounter (Signed)
Routed to Dr. Nyoka Cowden to advise.

## 2016-03-27 ENCOUNTER — Other Ambulatory Visit: Payer: Self-pay | Admitting: Internal Medicine

## 2016-03-27 DIAGNOSIS — I10 Essential (primary) hypertension: Secondary | ICD-10-CM

## 2016-03-31 ENCOUNTER — Telehealth: Payer: Self-pay | Admitting: Internal Medicine

## 2016-03-31 MED ORDER — RIFAXIMIN 550 MG PO TABS: 550.0000 mg | ORAL_TABLET | Freq: Three times a day (TID) | ORAL | 0 refills | Status: DC

## 2016-03-31 NOTE — Telephone Encounter (Signed)
Prescription sent to [facility] for prior auth.  Patient notified will be next week before we will get an answer back.  Patient again encouraged to see APP.  She very reluctantly agreed to see Alonza Bogus, PA on 04/07/16 .  She is aware that she will be contacted by [facility] for shipment of xifaxan once approved by her insurance.

## 2016-03-31 NOTE — Telephone Encounter (Signed)
Patient notified.  She is taking arthorotec.  She is advised that she needs to stop this.  She declines appt again with APP for next week.  She wants to try antibiotics and stopping arthrotec.  rx sent

## 2016-03-31 NOTE — Telephone Encounter (Signed)
Patient reports that patient is having worsening diarrhea. Husband feels that she is "going to end up admitted to the hospital again, she is just so weak" .  She is having 4-5 watery stools a day.  She has stools immediately following meals with a great deal of urgency.  She is taking lomotil 3-4 times a day along with dicyclomine 3-4 times a day.  She reports that neither do anything to slow the diarrhea.  She is offered an appt with APP she and her husband refuse this appt they want to be seen by MD only.  She has an appt with Dr. Henrene Pastor on 04/19/16.  Dr. Henrene Pastor please advise

## 2016-03-31 NOTE — Telephone Encounter (Signed)
Patient states that her insurance is requiring a prior auth for this and is wanting to know if something else could be called in. Best # [id number]

## 2016-03-31 NOTE — Telephone Encounter (Signed)
I would prefer that she sees an extender today as I am not available. Let them know this is my recommendation. Short of that, make sure she is not taking Arthrotec. Also prescribej Xifaxan 550 mg 3 times a day for 2 weeks. This helped previously

## 2016-03-31 NOTE — Addendum Note (Signed)
Addended by: Marlon Pel on: 03/31/2016 04:04 PM   Modules accepted: Orders

## 2016-04-03 LAB — TSH: TSH: 2.45 u[IU]/mL (ref 0.41–5.90)

## 2016-04-04 DIAGNOSIS — M4150 Other secondary scoliosis, site unspecified: Secondary | ICD-10-CM

## 2016-04-07 ENCOUNTER — Encounter: Payer: Self-pay | Admitting: Gastroenterology

## 2016-04-07 ENCOUNTER — Ambulatory Visit (INDEPENDENT_AMBULATORY_CARE_PROVIDER_SITE_OTHER): Payer: Medicare Other | Admitting: Gastroenterology

## 2016-04-07 ENCOUNTER — Encounter (INDEPENDENT_AMBULATORY_CARE_PROVIDER_SITE_OTHER): Payer: Self-pay

## 2016-04-07 VITALS — BP 110/60 | HR 85 | Wt 97.0 lb

## 2016-04-07 DIAGNOSIS — R197 Diarrhea, unspecified: Secondary | ICD-10-CM

## 2016-04-07 MED ORDER — GENTAMICIN SULFATE 0.1 % EX CREA: TOPICAL_CREAM | CUTANEOUS | 0 refills | Status: DC

## 2016-04-07 NOTE — Patient Instructions (Addendum)
Keep your appointment with Dr. Scarlette Shorts on 04-19-2016 at 9:15 am.  We have given you a printed prescription for the Gentamicin 0.1% ointment.

## 2016-04-07 NOTE — Progress Notes (Signed)
     04/07/2016 Joan [name] [id number] Jul 30, 1946   History of Present Illness:  This is a 71 year old female who is known to Dr. Henrene Pastor for recurrent/chronic diarrhea, working diagnosis is questionable postinfectious IBS/anxiety related/medication related (arthrotec).  Diarrhea is currently improved since she began Xifaxan 3 days ago. She also discontinued the Arthrotec about a week ago. She is here today because she is concerned that it will return again when she is off of the Xifaxan.  Currently she is only having one to 2 bowel movements a day and they are forming up with some solidity as well.   Current Medications, Allergies, Past Medical History, Past Surgical History, Family History and Social History were reviewed in [employer] record.   Physical Exam: BP 110/60   Pulse 85   Wt 97 lb (44 kg)   BMI 20.99 kg/m  General: Well developed white female in no acute distress Head: Normocephalic and atraumatic Eyes:  Sclerae anicteric, conjunctiva pink  Ears: Normal auditory acuity Lungs: Clear throughout to auscultation Heart: Regular rate and rhythm Abdomen: Soft, non-distended.  Normal bowel sounds.  Non-tender. Musculoskeletal: Symmetrical with no gross deformities  Extremities: No edema  Neurological: Alert oriented x 4, grossly non-focal Psychological:  Alert and cooperative. Normal mood and affect  Assessment and Recommendations: -Recurrent diarrhea:  working diagnosis is questionable postinfectious IBS/anxiety related/medication related (arthrotec).  Diarrhea is currently improved since she began Xifaxan 3 days ago. She also discontinued the Arthrotec about a week ago. She is here today because she is concerned that it will return again when she is off of the Xifaxan. I have told her that I cannot make a prediction what will happen when she comes off of the medication and that we will just have to deal with it further if it does recur again. I mentioned  to them about checking a pancreatic/fecal elastase level as part of evaluation if diarrhea recurs to rule out PI as a cause.

## 2016-04-07 NOTE — Progress Notes (Signed)
Reviewed and agree.

## 2016-04-11 ENCOUNTER — Encounter: Payer: Self-pay | Admitting: *Deleted

## 2016-04-11 NOTE — Progress Notes (Unsigned)
Received Labs from [facility]in[facility] Dr. Legrand Como [place]. Abstracted and given to Dr. Nyoka Cowden to review.

## 2016-04-19 ENCOUNTER — Ambulatory Visit (INDEPENDENT_AMBULATORY_CARE_PROVIDER_SITE_OTHER): Payer: Medicare Other | Admitting: Internal Medicine

## 2016-04-19 ENCOUNTER — Encounter: Payer: Self-pay | Admitting: Internal Medicine

## 2016-04-19 VITALS — BP 108/64 | HR 76 | Ht <= 58 in | Wt 96.0 lb

## 2016-04-19 DIAGNOSIS — R197 Diarrhea, unspecified: Secondary | ICD-10-CM

## 2016-04-19 NOTE — Patient Instructions (Signed)
Please follow up as needed 

## 2016-04-19 NOTE — Progress Notes (Signed)
HISTORY OF PRESENT ILLNESS:  Joan [name] is a 70 y.o. female with past medical history as listed below. I last saw the patient May 2017 for problems with recurrent diarrhea. She has been felt to have postinfectious IBS. She was last seen 2 weeks ago by our physician assistant. She had just discontinued Arthrotec as previously requested. She was treated was a fax in just prior to the visit. She was improving. She completed his Xifaxan. Has had no diarrhea for 2 weeks. No other complaints. Her husband had multiple questions. She could not afford Xifaxan. Fortunately, nursing was able to arrange for this to be provided to her no cost. She presents today for follow-up as requested.  REVIEW OF SYSTEMS:  All non-GI ROS negative except for arthritis, back pain, fatigue  Past Medical History:  Diagnosis Date  . Alopecia areata 02/16/2006  . Anxiety state, unspecified 2004  . Benign paroxysmal positional vertigo 04/01/03  . Cervicalgia 04/01/03  . Chronic rhinitis 09/01/05  . Corns and callosities   . Dermatophytosis of nail   . Diarrhea 04/15/2015  . Diverticulosis   . Dysphagia, unspecified(787.20) 2004  . Enthesopathy of ankle and tarsus, unspecified 03/31/96  . External hemorrhoids without mention of complication   . 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 Fibromyalgia   . Hypertension 2004  . Hypothyroidism   . Impacted cerumen    right ear  . Lumbago   . Myalgia and myositis, unspecified 03/01/05  . Nontoxic uninodular goiter   . Osteoarthrosis, unspecified whether generalized or localized, unspecified site 04/01/03  . Other left bundle branch block   . Rheumatoid arthritis(714.0) 10/05/2004   lumbar spondylosis. Lumbo-sacral anterolisthesis    . Senile osteoporosis 04/01/03  . Sprain of metatarsophalangeal (joint) of foot   . Thyrotoxicosis   . Thyrotoxicosis without mention of goiter or other cause, without mention of thyrotoxic crisis or storm   . Unspecified vitamin D deficiency   .  Urinary frequency     Past Surgical History:  Procedure Laterality Date  . COLONOSCOPY    . COLONOSCOPY N/A 04/15/2015   Procedure: COLONOSCOPY;  Surgeon: Gatha Mayer, MD;  Location: [place];  Service: Endoscopy;  Laterality: N/A;  . FOOT SURGERY Right 05/22/2014   Dr.Strom   . [place]   DR Cunningham   . TONSILLECTOMY AND ADENOIDECTOMY  1974  . TUBAL LIGATION  1973    Social History Joan [name]  reports that she has never smoked. She has never used smokeless tobacco. She reports that she does not drink alcohol or use drugs.  family history includes Heart attack in her father; Heart disease in her father.  Allergies  Allergen Reactions  . Arava [Leflunomide] Diarrhea, Nausea Only and Other (See Comments)    Loss appetite  . Methotrexate Derivatives Other (See Comments)    Hair loss  . Neurontin [Gabapentin] Nausea And Vomiting  . Penicillins Rash       PHYSICAL EXAMINATION: Vital signs: BP 108/64   Pulse 76   Ht 4' 8.69" (1.44 m) Comment: w/o shoes  Wt 96 lb (43.5 kg)   BMI 21.00 kg/m  General: Thin, well-nourished, no acute distress HEENT: Sclerae are anicteric, conjunctiva pink. Oral mucosa intact Lungs: Clear Heart: Regular Abdomen: soft, nontender, nondistended, no obvious ascites, no peritoneal signs, normal bowel sounds. No organomegaly. Extremities: No edema. Arthritic deformities Psychiatric: alert and oriented x3. Cooperative  ASSESSMENT:  #1. Diarrhea. Resolved after discontinuation of Arthrotec and course of Xifaxan. Postinfectious IBS versus medication   PLAN:  #1. Long discussion today regarding her issues with diarrhea. They had multiple multiple questions.  15 minutes spent face-to-face with the patient. Greater than 50% a time use for counseling regarding her diarrhea

## 2016-04-25 ENCOUNTER — Encounter: Payer: Self-pay | Admitting: Internal Medicine

## 2016-04-25 ENCOUNTER — Ambulatory Visit (INDEPENDENT_AMBULATORY_CARE_PROVIDER_SITE_OTHER): Payer: Medicare Other | Admitting: Internal Medicine

## 2016-04-25 VITALS — BP 114/72 | HR 72 | Temp 98.2°F | Ht <= 58 in | Wt 96.0 lb

## 2016-04-25 DIAGNOSIS — R197 Diarrhea, unspecified: Secondary | ICD-10-CM | POA: Diagnosis not present

## 2016-04-25 DIAGNOSIS — M059 Rheumatoid arthritis with rheumatoid factor, unspecified: Secondary | ICD-10-CM | POA: Diagnosis not present

## 2016-04-25 DIAGNOSIS — E039 Hypothyroidism, unspecified: Secondary | ICD-10-CM

## 2016-04-25 DIAGNOSIS — I10 Essential (primary) hypertension: Secondary | ICD-10-CM

## 2016-04-25 NOTE — Progress Notes (Signed)
Facility  [facility]    Place of Service:   OFFICE    Allergies  Allergen Reactions  . Arava [Leflunomide] Diarrhea, Nausea Only and Other (See Comments)    Loss appetite  . Methotrexate Derivatives Other (See Comments)    Hair loss  . Neurontin [Gabapentin] Nausea And Vomiting  . Penicillins Rash    Chief Complaint  Patient presents with  . Medication Management    taking Nabumetone 568m, it's helping but it doesn't have any refills, should she continue with it.  . Other    right ear has infection, Dermatologist is treating it, would like Dr. GNyoka Cowdento check also.     HPI:  Recurrent diarrhea: . Stopped Arthrotec. Improved >50%. Started on Xifaxan by Dr. PHenrene Pastor Last saw him 04/07/16, then 04/19/16.   Nabumetone.helps arthritis.  Treated with Cipro for external ear infection 04/17/16 04/27/16. Also had another antibiotic prior to that. Prescribed by her Dermatologist, Dr. WJimmye Normanin [place]  Medications: Patient's Medications  New Prescriptions   No medications on file  Previous Medications   ACETAMINOPHEN (TYLENOL) 500 MG TABLET    Take 500 mg by mouth every 6 (six) hours as needed for pain. Take two tablets up to four times a day as needed   AMITRIPTYLINE (ELAVIL) 10 MG TABLET    TAKE ONE TABLET BY MOUTH AT BEDTIME TO HELP RELAX MUSCLES   CIPROFLOXACIN (CIPRO) 500 MG TABLET    Take 500 mg by mouth. Take one tablet twice daily for infection right ear x 10 days.   COLESTIPOL (COLESTID) 1 G TABLET    Take 1 tablet (1 g total) by mouth 2 (two) times daily.   DICYCLOMINE (BENTYL) 10 MG CAPSULE    Take 1 capsule (10 mg total) by mouth 3 (three) times daily before meals.   DIPHENOXYLATE-ATROPINE (LOMOTIL) 2.5-0.025 MG TABLET    TAKE ONE TABLET BY MOUTH 4 TIMES DAILY AS NEEDED FOR DIARRHEA OR  LOOSE  STOOLS.   ENBREL SURECLICK 50 MG/ML INJECTION    Inject 50 mg as directed once a week. Inject once weekly for arthritis   EVISTA 60 MG TABLET    Take one tablet once daily for  osteoporosis   FLUTICASONE (FLONASE) 50 MCG/ACT NASAL SPRAY    Place 1 spray into the nose daily.   FOLIC ACID (FOLVITE) 1 MG TABLET    Take 1 mg by mouth daily. Take two tablets every morning.   GENTAMICIN CREAM (GARAMYCIN) 0.1 %    Apply Ointment into right ear twice daily.   GLUCOSAMINE-CHONDROIT-VIT C-MN (GLUCOSAMINE CHONDR 500 COMPLEX PO)    Take 500 mg by mouth daily. Take 2 tablets daily for joint health.   HYDROCHLOROTHIAZIDE (MICROZIDE) 12.5 MG CAPSULE    TAKE ONE CAPSULE BY MOUTH ONCE DAILY FOR BLOOD PRESSURE   MULTIPLE VITAMIN (MULTIVITAMIN) TABLET    Take 1 tablet by mouth daily. Take 1 tablet once a daily   NABUMETONE (RELAFEN) 500 MG TABLET    Take 1 tablet (500 mg total) by mouth 2 (two) times daily.   NEOMYCIN-POLYMYXIN-HYDROCORTISONE (CORTISPORIN) OTIC SOLUTION    Place 2 drops in affected ear 4 times daily for infection   OMEGA-3 FATTY ACIDS (FISH OIL) 1000 MG CAPS    Take 1,000 mg by mouth daily. Take one tablet once a daily for heart, joint and skin health.   ONDANSETRON (ZOFRAN) 4 MG TABLET    Take every 6 hours as needed as directed by MD   PREDNISONE (DELTASONE) 5 MG  TABLET    Take 5 mg by mouth. Take 1/2 alt with 1 tablet by mouth daily with breakfast for arthritis   TRAMADOL (ULTRAM) 50 MG TABLET    Take one or 2 up to 4 times in 24 hours for pain   UREA (CARMOL) 20 % CREAM    Apply to callus daily  Modified Medications   No medications on file  Discontinued Medications   DICLOFENAC (VOLTAREN) 75 MG EC TABLET    One twice daily to help arthritis    Review of Systems  Constitutional: Negative for chills, fever and unexpected weight change.  HENT: Negative for congestion, ear discharge, ear pain, hearing loss, sore throat and tinnitus.   Eyes: Negative for photophobia and pain.  Respiratory: Negative for cough, shortness of breath and wheezing.   Cardiovascular: Negative for chest pain, palpitations and leg swelling.  Gastrointestinal: Positive for diarrhea. Negative  for abdominal pain, constipation, nausea and vomiting.  Endocrine: Negative for polydipsia.  Genitourinary: Negative for dysuria, frequency and urgency.  Musculoskeletal: Positive for back pain (Chronic low back discomfort with pain radiating down the right leg). Negative for neck pain.       Diffuse arthralgias. History of rheumatoid arthritis. Currently on Enbrel. Under the care of Dr. Amil Amen.  Skin: Negative for rash.       callus on heels.  Allergic/Immunologic: Negative for environmental allergies.  Neurological: Negative for dizziness, tremors, seizures, weakness and headaches.  Hematological: Does not bruise/bleed easily.  Psychiatric/Behavioral: Negative for hallucinations and suicidal ideas. The patient is nervous/anxious.     Vitals:   04/25/16 1547  BP: 114/72  Pulse: 72  Temp: 98.2 F (36.8 C)  TempSrc: Oral  SpO2: 96%  Weight: 96 lb (43.5 kg)  Height: 4' 9"  (1.448 m)   Body mass index is 20.77 kg/m. Wt Readings from Last 3 Encounters:  04/25/16 96 lb (43.5 kg)  04/19/16 96 lb (43.5 kg)  04/07/16 97 lb (44 kg)      Physical Exam  Constitutional: She is oriented to person, place, and time. She appears well-developed and well-nourished.  HENT:  Head: Normocephalic.  Right Ear: External ear normal.  Mouth/Throat: No oropharyngeal exudate.  Eyes: Conjunctivae and EOM are normal. Pupils are equal, round, and reactive to light.  Neck: Neck supple. No tracheal deviation present. Thyromegaly present.  Cardiovascular: Normal rate, regular rhythm, normal heart sounds and intact distal pulses.   Pulmonary/Chest: Effort normal and breath sounds normal. No stridor.  Abdominal: Soft. Bowel sounds are normal.  Musculoskeletal: She exhibits no edema or tenderness.  Bilateral bunions and right hammer toe. Tender right SI joint. Chronic back discomfort with pain radiating down the right leg.  Lymphadenopathy:    She has no cervical adenopathy.  Neurological: She is  oriented to person, place, and time. She has normal reflexes. No cranial nerve deficit. Coordination normal.  Skin: Skin is warm and dry. No rash noted.  Psychiatric: She has a normal mood and affect. Her behavior is normal. Judgment and thought content normal.    Labs reviewed: Lab Summary Latest Ref Rng & Units 02/02/2016 10/04/2015  Hemoglobin 11.1 - 15.9 g/dL 13.0 (None)  Hematocrit 34.0 - 46.6 % 39.9 (None)  White count 3.4 - 10.8 x10E3/uL 7.7 (None)  Platelet count - (None) (None)  Sodium 134 - 144 mmol/L 143 140  Potassium 3.5 - 5.2 mmol/L 4.1 4.1  Calcium 8.7 - 10.3 mg/dL 8.6(L) 8.4(L)  Phosphorus - (None) (None)  Creatinine 0.57 - 1.00 mg/dL 0.66 0.59  AST 0 - 40 IU/L 13 13  Alk Phos 39 - 117 IU/L 57 58  Bilirubin 0.0 - 1.2 mg/dL 0.2 <0.2  Glucose 65 - 99 mg/dL 81 77  Cholesterol - (None) (None)  HDL cholesterol - (None) (None)  Triglycerides - (None) (None)  LDL Direct - (None) (None)  LDL Calc - (None) (None)  Total protein - (None) (None)  Albumin 3.6 - 4.8 g/dL 3.5(L) 3.6  Some recent data might be hidden   Lab Results  Component Value Date   TSH 2.45 04/03/2016   TSH 6.030 (H) 02/02/2016   TSH 6.380 (H) 10/04/2015   THYROIDAB 28 12/03/2012   Lab Results  Component Value Date   BUN 13 02/02/2016   BUN 18 10/04/2015   BUN 22 05/24/2015   No results found for: HGBA1C  Assessment/Plan  1. Diarrhea, unspecified type Patient will continue with current medications  2. Hypothyroidism, unspecified hypothyroidism type Continue see Dr. Elyse Hsu. Currently off thyroid medication and with normal TSH values.  3. Essential hypertension Controlled  4. Rheumatoid arthritis with positive rheumatoid factor, involving unspecified site [facility]) Continue see Dr. Amil Amen. Remains on Enbrel.

## 2016-05-16 ENCOUNTER — Other Ambulatory Visit: Payer: Self-pay | Admitting: Internal Medicine

## 2016-05-16 DIAGNOSIS — M059 Rheumatoid arthritis with rheumatoid factor, unspecified: Secondary | ICD-10-CM

## 2016-06-05 LAB — TSH: TSH: 5.45 u[IU]/mL (ref 0.41–5.90)

## 2016-06-19 ENCOUNTER — Encounter: Payer: Self-pay | Admitting: *Deleted

## 2016-06-20 ENCOUNTER — Encounter: Payer: Self-pay | Admitting: Internal Medicine

## 2016-06-20 ENCOUNTER — Ambulatory Visit (INDEPENDENT_AMBULATORY_CARE_PROVIDER_SITE_OTHER): Payer: Medicare Other | Admitting: Internal Medicine

## 2016-06-20 VITALS — BP 138/76 | HR 77 | Temp 98.2°F | Ht <= 58 in | Wt 101.0 lb

## 2016-06-20 DIAGNOSIS — M5387 Other specified dorsopathies, lumbosacral region: Secondary | ICD-10-CM

## 2016-06-20 DIAGNOSIS — K529 Noninfective gastroenteritis and colitis, unspecified: Secondary | ICD-10-CM | POA: Diagnosis not present

## 2016-06-20 DIAGNOSIS — M059 Rheumatoid arthritis with rheumatoid factor, unspecified: Secondary | ICD-10-CM | POA: Diagnosis not present

## 2016-06-20 DIAGNOSIS — E039 Hypothyroidism, unspecified: Secondary | ICD-10-CM | POA: Diagnosis not present

## 2016-06-20 DIAGNOSIS — I10 Essential (primary) hypertension: Secondary | ICD-10-CM

## 2016-06-20 DIAGNOSIS — R197 Diarrhea, unspecified: Secondary | ICD-10-CM

## 2016-06-20 DIAGNOSIS — M539 Dorsopathy, unspecified: Secondary | ICD-10-CM

## 2016-06-20 MED ORDER — DICLOFENAC SODIUM 50 MG PO TBEC: DELAYED_RELEASE_TABLET | ORAL | 5 refills | Status: DC

## 2016-06-20 MED ORDER — PREDNISONE 5 MG PO TABS: ORAL_TABLET | ORAL | 4 refills | Status: DC

## 2016-06-20 MED ORDER — TRAMADOL HCL 50 MG PO TABS: ORAL_TABLET | ORAL | 4 refills | Status: DC

## 2016-06-20 NOTE — Progress Notes (Signed)
Facility  [facility]    Place of Service:   OFFICE    Allergies  Allergen Reactions  . Arava [Leflunomide] Diarrhea, Nausea Only and Other (See Comments)    Loss appetite  . Methotrexate Derivatives Other (See Comments)    Hair loss  . Neurontin [Gabapentin] Nausea And Vomiting  . Penicillins Rash    Chief Complaint  Patient presents with  . Medical Management of Chronic Issues    4 month medication management blood pressure, thyroid, rheumatoid arthritis.  . Breast Problem    2 months ago an ant fell in her bra, later that day she removed her bra. There was white crust on nipple, she peeled it off and it started bleeding. Still  tender.     HPI:  Mammogram on 02/09/16 was normal.  Continues to hold levothyroxine 50 mcg qod on 06/12/2016 per Dr. Elyse Hsu. Tsh on 06/05/16 was 5.450. Free T4 was normal at 1.06. Free T3 is normal at 2.8.  Diarrhea has resolved. She is off Xifaxan.  She is having a flare of the RA with pain in some fingers bilateerally.  Medications: Patient's Medications  New Prescriptions   No medications on file  Previous Medications   ACETAMINOPHEN (TYLENOL) 500 MG TABLET    Take 500 mg by mouth every 6 (six) hours as needed for pain. Take two tablets up to four times a day as needed   AMITRIPTYLINE (ELAVIL) 10 MG TABLET    TAKE ONE TABLET BY MOUTH AT BEDTIME TO HELP RELAX MUSCLES   CIPROFLOXACIN (CIPRO) 500 MG TABLET    Take 500 mg by mouth. Take one tablet twice daily for infection right ear x 10 days.   COLESTIPOL (COLESTID) 1 G TABLET    Take 1 tablet (1 g total) by mouth 2 (two) times daily.   DICYCLOMINE (BENTYL) 10 MG CAPSULE    Take 1 capsule (10 mg total) by mouth 3 (three) times daily before meals.   DIPHENOXYLATE-ATROPINE (LOMOTIL) 2.5-0.025 MG TABLET    TAKE ONE TABLET BY MOUTH 4 TIMES DAILY AS NEEDED FOR DIARRHEA OR  LOOSE  STOOLS.   ENBREL SURECLICK 50 MG/ML INJECTION    Inject 50 mg as directed once a week. Inject once weekly for arthritis   EVISTA 60 MG TABLET    Take one tablet once daily for osteoporosis   FLUTICASONE (FLONASE) 50 MCG/ACT NASAL SPRAY    Place 1 spray into the nose daily.   FOLIC ACID (FOLVITE) 1 MG TABLET    Take 1 mg by mouth daily. Take two tablets every morning.   GENTAMICIN CREAM (GARAMYCIN) 0.1 %    Apply Ointment into right ear twice daily.   GLUCOSAMINE-CHONDROIT-VIT C-MN (GLUCOSAMINE CHONDR 500 COMPLEX PO)    Take 500 mg by mouth daily. Take 2 tablets daily for joint health.   HYDROCHLOROTHIAZIDE (MICROZIDE) 12.5 MG CAPSULE    TAKE ONE CAPSULE BY MOUTH ONCE DAILY FOR BLOOD PRESSURE   MULTIPLE VITAMIN (MULTIVITAMIN) TABLET    Take 1 tablet by mouth daily. Take 1 tablet once a daily   NABUMETONE (RELAFEN) 500 MG TABLET    Take 1 tablet (500 mg total) by mouth 2 (two) times daily.   NEOMYCIN-POLYMYXIN-HYDROCORTISONE (CORTISPORIN) OTIC SOLUTION    Place 2 drops in affected ear 4 times daily for infection   OMEGA-3 FATTY ACIDS (FISH OIL) 1000 MG CAPS    Take 1,000 mg by mouth daily. Take one tablet once a daily for heart, joint and skin health.  ONDANSETRON (ZOFRAN) 4 MG TABLET    Take every 6 hours as needed as directed by MD   PREDNISONE (DELTASONE) 5 MG TABLET    Take 5 mg by mouth. Take 1/2 alt with 1 tablet by mouth daily with breakfast for arthritis   TRAMADOL (ULTRAM) 50 MG TABLET    TAKE ONE OR TWO TABLETS BY MOUTH UP TO 4 TIMES DAILY (IN  24  HOURS)  FOR  PAIN   UREA (CARMOL) 20 % CREAM    Apply to callus daily  Modified Medications   No medications on file  Discontinued Medications   No medications on file    Review of Systems  Constitutional: Negative for chills, fever and unexpected weight change.  HENT: Negative for congestion, ear discharge, ear pain, hearing loss, sore throat and tinnitus.   Eyes: Negative for photophobia and pain.  Respiratory: Negative for cough, shortness of breath and wheezing.   Cardiovascular: Negative for chest pain, palpitations and leg swelling.  Gastrointestinal:  Positive for diarrhea. Negative for abdominal pain, constipation, nausea and vomiting.  Endocrine: Negative for polydipsia.  Genitourinary: Negative for dysuria, frequency and urgency.  Musculoskeletal: Positive for back pain (Chronic low back discomfort with pain radiating down the right leg). Negative for neck pain.       Diffuse arthralgias. History of rheumatoid arthritis. Currently on Enbrel. Under the care of Dr. Amil Amen.  Skin: Negative for rash.       callus on heels.  Allergic/Immunologic: Negative for environmental allergies.  Neurological: Negative for dizziness, tremors, seizures, weakness and headaches.  Hematological: Does not bruise/bleed easily.  Psychiatric/Behavioral: Negative for hallucinations and suicidal ideas. The patient is nervous/anxious.     Vitals:   06/20/16 1340  BP: 138/76  Pulse: 77  Temp: 98.2 F (36.8 C)  TempSrc: Oral  SpO2: 97%  Weight: 101 lb (45.8 kg)  Height: _0  (1.448 m)   Body mass index is 21.86 kg/m. Wt Readings from Last 3 Encounters:  06/20/16 101 lb (45.8 kg)  04/25/16 96 lb (43.5 kg)  04/19/16 96 lb (43.5 kg)      Physical Exam  Constitutional: She is oriented to person, place, and time. She appears well-developed and well-nourished.  HENT:  Head: Normocephalic.  Right Ear: External ear normal.  Mouth/Throat: No oropharyngeal exudate.  Eyes: Conjunctivae and EOM are normal. Pupils are equal, round, and reactive to light.  Neck: Neck supple. No tracheal deviation present. Thyromegaly present.  Cardiovascular: Normal rate, regular rhythm, normal heart sounds and intact distal pulses.   Pulmonary/Chest: Effort normal and breath sounds normal. No stridor.  Abdominal: Soft. Bowel sounds are normal.  Musculoskeletal: She exhibits no edema or tenderness.  Bilateral bunions and right hammer toe. Tender right SI joint. Chronic back discomfort with pain radiating down the right leg.  Lymphadenopathy:    She has no cervical  adenopathy.  Neurological: She is oriented to person, place, and time. She has normal reflexes. No cranial nerve deficit. Coordination normal.  Skin: Skin is warm and dry. No rash noted.  Psychiatric: She has a normal mood and affect. Her behavior is normal. Judgment and thought content normal.    Labs reviewed: Lab Summary Latest Ref Rng & Units 02/02/2016 10/04/2015  Hemoglobin 11.1 - 15.9 g/dL 13.0 (None)  Hematocrit 34.0 - 46.6 % 39.9 (None)  White count 3.4 - 10.8 x10E3/uL 7.7 (None)  Platelet count - (None) (None)  Sodium 134 - 144 mmol/L 143 140  Potassium 3.5 - 5.2 mmol/L 4.1 4.1  Calcium 8.7 - 10.3 mg/dL 8.6(L) 8.4(L)  Phosphorus - (None) (None)  Creatinine 0.57 - 1.00 mg/dL 0.66 0.59  AST 0 - 40 IU/L 13 13  Alk Phos 39 - 117 IU/L 57 58  Bilirubin 0.0 - 1.2 mg/dL 0.2 <0.2  Glucose 65 - 99 mg/dL 81 77  Cholesterol - (None) (None)  HDL cholesterol - (None) (None)  Triglycerides - (None) (None)  LDL Direct - (None) (None)  LDL Calc - (None) (None)  Total protein - (None) (None)  Albumin 3.6 - 4.8 g/dL 3.5(L) 3.6  Some recent data might be hidden   Lab Results  Component Value Date   TSH 5.45 06/05/2016   TSH 2.45 04/03/2016   TSH 6.030 (H) 02/02/2016   THYROIDAB 28 12/03/2012   Lab Results  Component Value Date   BUN 13 02/02/2016   BUN 18 10/04/2015   BUN 22 05/24/2015   No results found for: HGBA1C  Assessment/Plan  1. Rheumatoid arthritis with positive rheumatoid factor, involving unspecified site (HCC) -Stop nabumetone - traMADol (ULTRAM) 50 MG tablet; TAKE ONE OR TWO TABLETS BY MOUTH UP TO 4 TIMES DAILY (IN  24  HOURS)  FOR  PAIN  Dispense: 240 tablet; Refill: 4 - diclofenac (VOLTAREN) 50 MG EC tablet; One twice daily to help arthritis pain  Dispense: 60 tablet; Refill: 5 - predniSONE (DELTASONE) 5 MG tablet; Take 1/2 alt with 1 tablet by mouth daily with breakfast for arthritis  Dispense: 135 tablet; Refill: 4  2. Essential hypertension - Comprehensive  metabolic panel  3. Hypothyroidism, unspecified type - TSH; Future  4. Colitis improved  5. Sciatica of right side associated with disorder of lumbosacral spine - Ambulatory epidural steroid injection; Future  6. Diarrhea, unspecified type resolved

## 2016-06-20 NOTE — Addendum Note (Signed)
Addended by: Estill Dooms on: 06/20/2016 02:36 PM   Modules accepted: Orders

## 2016-06-20 NOTE — Patient Instructions (Signed)
Stop nabumetone. Start diclofenac twice daily. Continue current dose of prednisone.

## 2016-07-05 ENCOUNTER — Telehealth: Payer: Self-pay | Admitting: *Deleted

## 2016-07-05 DIAGNOSIS — M5431 Sciatica, right side: Secondary | ICD-10-CM

## 2016-07-05 NOTE — Telephone Encounter (Signed)
Patient called and stated that she had just seen you and requested a referral for an injection in her back but haven't heard anything.I reviewed note and order was placed for Epidural steroid injection for Sciatica of Right side. Order replaced and patient notified.

## 2016-07-06 ENCOUNTER — Other Ambulatory Visit: Payer: Self-pay | Admitting: Internal Medicine

## 2016-07-06 DIAGNOSIS — M5431 Sciatica, right side: Secondary | ICD-10-CM

## 2016-07-17 ENCOUNTER — Ambulatory Visit
Admission: RE | Admit: 2016-07-17 | Discharge: 2016-07-17 | Disposition: A | Discharge status: Home / Self Care | Payer: Medicare Other | Source: Ambulatory Visit | Attending: Internal Medicine | Admitting: Internal Medicine

## 2016-07-17 DIAGNOSIS — M5431 Sciatica, right side: Secondary | ICD-10-CM

## 2016-07-17 MED ORDER — IOPAMIDOL (ISOVUE-M 200) INJECTION 41%
1.0000 mL | Freq: Once | INTRAMUSCULAR | Status: AC
  Administered 2016-07-17: 1 mL via EPIDURAL

## 2016-07-17 MED ORDER — METHYLPREDNISOLONE ACETATE 40 MG/ML INJ SUSP (RADIOLOG
120.0000 mg | Freq: Once | INTRAMUSCULAR | Status: AC
  Administered 2016-07-17: 120 mg via EPIDURAL

## 2016-07-17 NOTE — Discharge Instructions (Signed)

## 2016-07-25 ENCOUNTER — Telehealth: Payer: Self-pay | Admitting: *Deleted

## 2016-07-25 ENCOUNTER — Other Ambulatory Visit: Payer: Self-pay | Admitting: Internal Medicine

## 2016-07-25 DIAGNOSIS — M5387 Other specified dorsopathies, lumbosacral region: Secondary | ICD-10-CM

## 2016-07-25 NOTE — Telephone Encounter (Signed)
I entered order for referral to Dr. Jola Baptist.

## 2016-07-25 NOTE — Telephone Encounter (Signed)
Called patient regarding her request for a referral , I let her know that the referral had been entered in Epic for Dr.Curnes.

## 2016-07-25 NOTE — Telephone Encounter (Signed)
Patient called regarding a referral for Dr. Beatris Ship to give her another shot in her back before Christmas. She stated that she has spoke with Dr. Beatris Ship and he agrees. Please Advise!

## 2016-07-26 ENCOUNTER — Telehealth: Payer: Self-pay | Admitting: Internal Medicine

## 2016-07-26 NOTE — Telephone Encounter (Signed)
left msg asking pt to confirm this AWV appt w/ nurse. VDM (DD) °

## 2016-08-08 ENCOUNTER — Other Ambulatory Visit: Payer: Medicare Other

## 2016-08-13 ENCOUNTER — Other Ambulatory Visit: Payer: Self-pay | Admitting: Nurse Practitioner

## 2016-08-13 DIAGNOSIS — M059 Rheumatoid arthritis with rheumatoid factor, unspecified: Secondary | ICD-10-CM

## 2016-08-14 NOTE — Telephone Encounter (Signed)
rx called into pharmacy

## 2016-08-14 NOTE — Telephone Encounter (Signed)
Ok to refill? Last filled 06/20/16 #[id number]

## 2016-08-16 ENCOUNTER — Other Ambulatory Visit: Payer: Self-pay | Admitting: Internal Medicine

## 2016-08-23 ENCOUNTER — Ambulatory Visit
Admission: RE | Admit: 2016-08-23 | Discharge: 2016-08-23 | Disposition: A | Discharge status: Home / Self Care | Payer: Medicare Other | Source: Ambulatory Visit | Attending: Internal Medicine | Admitting: Internal Medicine

## 2016-08-23 DIAGNOSIS — M5387 Other specified dorsopathies, lumbosacral region: Secondary | ICD-10-CM

## 2016-08-23 MED ORDER — IOPAMIDOL (ISOVUE-M 200) INJECTION 41%
1.0000 mL | Freq: Once | INTRAMUSCULAR | Status: AC
  Administered 2016-08-23: 1 mL via EPIDURAL

## 2016-08-23 MED ORDER — METHYLPREDNISOLONE ACETATE 40 MG/ML INJ SUSP (RADIOLOG
120.0000 mg | Freq: Once | INTRAMUSCULAR | Status: AC
  Administered 2016-08-23: 120 mg via EPIDURAL

## 2016-08-23 NOTE — Discharge Instructions (Signed)

## 2016-10-06 ENCOUNTER — Other Ambulatory Visit: Payer: Self-pay | Admitting: Internal Medicine

## 2016-10-06 DIAGNOSIS — M059 Rheumatoid arthritis with rheumatoid factor, unspecified: Secondary | ICD-10-CM

## 2016-10-12 DIAGNOSIS — M5440 Lumbago with sciatica, unspecified side: Secondary | ICD-10-CM

## 2016-10-12 DIAGNOSIS — G8929 Other chronic pain: Secondary | ICD-10-CM | POA: Insufficient documentation

## 2016-10-27 ENCOUNTER — Other Ambulatory Visit: Payer: Medicare Other

## 2016-10-27 DIAGNOSIS — E039 Hypothyroidism, unspecified: Secondary | ICD-10-CM

## 2016-10-27 DIAGNOSIS — I10 Essential (primary) hypertension: Secondary | ICD-10-CM

## 2016-10-27 DIAGNOSIS — M0579 Rheumatoid arthritis with rheumatoid factor of multiple sites without organ or systems involvement: Secondary | ICD-10-CM

## 2016-10-27 LAB — COMPREHENSIVE METABOLIC PANEL
ALT: 10 U/L (ref 6–29)
AST: 16 U/L (ref 10–35)
Albumin: 3.9 g/dL (ref 3.6–5.1)
Alkaline Phosphatase: 39 U/L (ref 33–130)
BUN: 24 mg/dL (ref 7–25)
CHLORIDE: 102 mmol/L (ref 98–110)
CO2: 31 mmol/L (ref 20–31)
CREATININE: 0.81 mg/dL (ref 0.50–0.99)
Calcium: 9 mg/dL (ref 8.6–10.4)
GLUCOSE: 83 mg/dL (ref 65–99)
Potassium: 3.9 mmol/L (ref 3.5–5.3)
SODIUM: 142 mmol/L (ref 135–146)
Total Bilirubin: 0.4 mg/dL (ref 0.2–1.2)
Total Protein: 6.4 g/dL (ref 6.1–8.1)

## 2016-10-27 LAB — TSH: TSH: 7.02 m[IU]/L — AB

## 2016-10-28 ENCOUNTER — Other Ambulatory Visit: Payer: Self-pay | Admitting: Internal Medicine

## 2016-10-28 ENCOUNTER — Other Ambulatory Visit: Payer: Self-pay | Admitting: Nurse Practitioner

## 2016-10-28 DIAGNOSIS — M059 Rheumatoid arthritis with rheumatoid factor, unspecified: Secondary | ICD-10-CM

## 2016-10-30 LAB — CBC WITH DIFFERENTIAL/PLATELET
BASOS PCT: 0 %
Basophils Absolute: 0 cells/uL (ref 0–200)
EOS ABS: 306 {cells}/uL (ref 15–500)
Eosinophils Relative: 3 %
HEMATOCRIT: 42.4 % (ref 35.0–45.0)
Hemoglobin: 13.5 g/dL (ref 11.7–15.5)
LYMPHS ABS: 4794 {cells}/uL — AB (ref 850–3900)
Lymphocytes Relative: 47 %
MCH: 33.1 pg — ABNORMAL HIGH (ref 27.0–33.0)
MCHC: 31.8 g/dL — ABNORMAL LOW (ref 32.0–36.0)
MCV: 103.9 fL — AB (ref 80.0–100.0)
MONO ABS: 816 {cells}/uL (ref 200–950)
MPV: 12.1 fL (ref 7.5–12.5)
Monocytes Relative: 8 %
NEUTROS ABS: 4284 {cells}/uL (ref 1500–7800)
Neutrophils Relative %: 42 %
Platelets: 291 10*3/uL (ref 140–400)
RBC: 4.08 MIL/uL (ref 3.80–5.10)
RDW: 13.3 % (ref 11.0–15.0)
WBC: 10.2 10*3/uL (ref 3.8–10.8)

## 2016-10-31 ENCOUNTER — Encounter: Payer: Self-pay | Admitting: Internal Medicine

## 2016-10-31 ENCOUNTER — Ambulatory Visit (INDEPENDENT_AMBULATORY_CARE_PROVIDER_SITE_OTHER): Payer: Medicare Other | Admitting: Internal Medicine

## 2016-10-31 ENCOUNTER — Ambulatory Visit (INDEPENDENT_AMBULATORY_CARE_PROVIDER_SITE_OTHER): Payer: Medicare Other

## 2016-10-31 VITALS — BP 150/84 | HR 74 | Temp 98.6°F | Ht <= 58 in | Wt 111.0 lb

## 2016-10-31 DIAGNOSIS — M539 Dorsopathy, unspecified: Secondary | ICD-10-CM | POA: Diagnosis not present

## 2016-10-31 DIAGNOSIS — M059 Rheumatoid arthritis with rheumatoid factor, unspecified: Secondary | ICD-10-CM | POA: Diagnosis not present

## 2016-10-31 DIAGNOSIS — E039 Hypothyroidism, unspecified: Secondary | ICD-10-CM | POA: Diagnosis not present

## 2016-10-31 DIAGNOSIS — R197 Diarrhea, unspecified: Secondary | ICD-10-CM | POA: Diagnosis not present

## 2016-10-31 DIAGNOSIS — Z0000 Encounter for general adult medical examination without abnormal findings: Secondary | ICD-10-CM | POA: Diagnosis not present

## 2016-10-31 DIAGNOSIS — M5387 Other specified dorsopathies, lumbosacral region: Secondary | ICD-10-CM

## 2016-10-31 DIAGNOSIS — Z23 Encounter for immunization: Secondary | ICD-10-CM

## 2016-10-31 DIAGNOSIS — H6123 Impacted cerumen, bilateral: Secondary | ICD-10-CM | POA: Diagnosis not present

## 2016-10-31 DIAGNOSIS — I10 Essential (primary) hypertension: Secondary | ICD-10-CM | POA: Diagnosis not present

## 2016-10-31 NOTE — Patient Instructions (Addendum)
Joan [name] , Thank you for taking time to come for your Medicare Wellness Visit. I appreciate your ongoing commitment to your health goals. Please review the following plan we discussed and let me know if I can assist you in the future.   These are the goals we discussed: Goals    . Increase water intake          Starting 10/31/16, I will attempt to increase my water intake by 2 more glasses daily, for a total of 6 glasses per day.       This is a list of the screening recommended for you and due dates:  Health Maintenance  Topic Date Due  .  Hepatitis C: One time screening is recommended by Center for Disease Control  (CDC) for  adults born from [birth year] through 1964.   [birth year]  . DEXA scan (bone density measurement)  09/01/2012  . Tetanus Vaccine  11/02/2017*  . Mammogram  02/07/2018  . Colon Cancer Screening  04/14/2025  . Flu Shot  Completed  . Pneumonia vaccines  Completed  *Topic was postponed. The date shown is not the original due date.  Preventive Care for Adults  A healthy lifestyle and preventive care can promote health and wellness. Preventive health guidelines for adults include the following key practices.  . A routine yearly physical is a good way to check with your health care provider about your health and preventive screening. It is a chance to share any concerns and updates on your health and to receive a thorough exam.  . Visit your dentist for a routine exam and preventive care every 6 months. Brush your teeth twice a day and floss once a day. Good oral hygiene prevents tooth decay and gum disease.  . The frequency of eye exams is based on your age, health, family medical history, use  of contact lenses, and other factors. Follow your health care provider's ecommendations for frequency of eye exams.  . Eat a healthy diet. Foods like vegetables, fruits, whole grains, low-fat dairy products, and lean protein foods contain the nutrients you need without too many  calories. Decrease your intake of foods high in solid fats, added sugars, and salt. Eat the right amount of calories for you. Get information about a proper diet from your health care provider, if necessary.  . Regular physical exercise is one of the most important things you can do for your health. Most adults should get at least 150 minutes of moderate-intensity exercise (any activity that increases your heart rate and causes you to sweat) each week. In addition, most adults need muscle-strengthening exercises on 2 or more days a week.  Silver Sneakers may be a benefit available to you. To determine eligibility, you may visit the website: www.silversneakers.com or contact program at 604-617-9207 Mon-Fri between 8AM-8PM.   . Maintain a healthy weight. The body mass index (BMI) is a screening tool to identify possible weight problems. It provides an estimate of body fat based on height and weight. Your health care provider can find your BMI and can help you achieve or maintain a healthy weight.   For adults 20 years and older: ? A BMI below 18.5 is considered underweight. ? A BMI of 18.5 to 24.9 is normal. ? A BMI of 25 to 29.9 is considered overweight. ? A BMI of 30 and above is considered obese.   . Maintain normal blood lipids and cholesterol levels by exercising and minimizing your intake of saturated fat. Eat a  balanced diet with plenty of fruit and vegetables. Blood tests for lipids and cholesterol should begin at age 30 and be repeated every 5 years. If your lipid or cholesterol levels are high, you are over 50, or you are at high risk for heart disease, you may need your cholesterol levels checked more frequently. Ongoing high lipid and cholesterol levels should be treated with medicines if diet and exercise are not working.  . If you smoke, find out from your health care provider how to quit. If you do not use tobacco, please do not start.  . If you choose to drink alcohol, please do  not consume more than 2 drinks per day. One drink is considered to be 12 ounces (355 mL) of beer, 5 ounces (148 mL) of wine, or 1.5 ounces (44 mL) of liquor.  . If you are 37-2 years old, ask your health care provider if you should take aspirin to prevent strokes.  . Use sunscreen. Apply sunscreen liberally and repeatedly throughout the day. You should seek shade when your shadow is shorter than you. Protect yourself by wearing long sleeves, pants, a wide-brimmed hat, and sunglasses year round, whenever you are outdoors.  . Once a month, do a whole body skin exam, using a mirror to look at the skin on your back. Tell your health care provider of new moles, moles that have irregular borders, moles that are larger than a pencil eraser, or moles that have changed in shape or color.

## 2016-10-31 NOTE — Progress Notes (Signed)
Facility  [facility]    Place of Service:   OFFICE    Allergies  Allergen Reactions  . Arava [Leflunomide] Diarrhea, Nausea Only and Other (See Comments)    Loss appetite  . Methotrexate Derivatives Other (See Comments)    Hair loss  . Neurontin [Gabapentin] Nausea And Vomiting  . Penicillins Rash    Chief Complaint  Patient presents with  . Medical Management of Chronic Issues    4 month medication management blood pressure, thyroid, RA, review labs    HPI:  Essential hypertension - mild elevation in SBP. No chest discomfort or palpitations  Hypothyroidism, unspecified type - elevation in TSH.  Rheumatoid arthritis with positive rheumatoid factor, involving unspecified site ([place]) - stable. Has been slowly deforming for the last 15 years  Bilateral impacted cerumen - not hearing well from either ear  Diarrhea, unspecified type - resolved. She still thinks it had something to do with starting levothyroxine. I am skeptical of this.  Sciatica of right side associated with disorder of lumbosacral spine - continues with significant pain. She has had a good experience with epidural injections and is requesting approval for another one.     Medications: Patient's Medications  New Prescriptions   No medications on file  Previous Medications   ACETAMINOPHEN (TYLENOL) 500 MG TABLET    Take 500 mg by mouth every 6 (six) hours as needed for pain. Take two tablets up to four times a day as needed   AMITRIPTYLINE (ELAVIL) 10 MG TABLET    TAKE ONE TABLET BY MOUTH AT BEDTIME TO HELP RELAX MUSCLES   DICYCLOMINE (BENTYL) 10 MG CAPSULE    Take 1 capsule (10 mg total) by mouth 3 (three) times daily before meals.   DIPHENOXYLATE-ATROPINE (LOMOTIL) 2.5-0.025 MG TABLET    TAKE ONE TABLET BY MOUTH 4 TIMES DAILY AS NEEDED FOR DIARRHEA OR  LOOSE  STOOLS.   ENBREL SURECLICK 50 MG/ML INJECTION    Inject 50 mg as directed once a week. Inject once weekly for arthritis   FLUTICASONE (FLONASE) 50  MCG/ACT NASAL SPRAY    Place 1 spray into the nose daily.   FOLIC ACID (FOLVITE) 1 MG TABLET    Take 1 mg by mouth daily. Take two tablets every morning.   GENTAMICIN CREAM (GARAMYCIN) 0.1 %    Apply Ointment into right ear twice daily.   GLUCOSAMINE-CHONDROIT-VIT C-MN (GLUCOSAMINE CHONDR 500 COMPLEX PO)    Take 500 mg by mouth daily. Take 2 tablets daily for joint health.   HYDROCHLOROTHIAZIDE (MICROZIDE) 12.5 MG CAPSULE    TAKE ONE CAPSULE BY MOUTH ONCE DAILY FOR BLOOD PRESSURE   MULTIPLE VITAMIN (MULTIVITAMIN) TABLET    Take 1 tablet by mouth daily. Take 1 tablet once a daily   OMEGA-3 FATTY ACIDS (FISH OIL) 1000 MG CAPS    Take 1,000 mg by mouth daily. Take one tablet once a daily for heart, joint and skin health.   PREDNISONE (DELTASONE) 5 MG TABLET    Take 1/2 alt with 1 tablet by mouth daily with breakfast for arthritis   RALOXIFENE (EVISTA) 60 MG TABLET    TAKE ONE TABLET BY MOUTH ONCE DAILY TO  TREAT  OSTEOPOROSIS.   TRAMADOL (ULTRAM) 50 MG TABLET    TAKE ONE OR TWO TABLETS BY MOUTH UP TO 4 TIMES DAILY (IN 24 HOURS) FOR PAIN   UREA (CARMOL) 20 % CREAM    Apply to callus daily  Modified Medications   No medications on file  Discontinued Medications   No medications on file    Review of Systems  Constitutional: Negative for chills, fever and unexpected weight change.  HENT: Negative for congestion, ear discharge, ear pain, hearing loss, sore throat and tinnitus.   Eyes: Negative for photophobia and pain.  Respiratory: Negative for cough, shortness of breath and wheezing.   Cardiovascular: Negative for chest pain, palpitations and leg swelling.  Gastrointestinal: Positive for diarrhea. Negative for abdominal pain, constipation, nausea and vomiting.  Endocrine: Negative for polydipsia.  Genitourinary: Negative for dysuria, frequency and urgency.  Musculoskeletal: Positive for back pain (Chronic low back discomfort with pain radiating down the right leg). Negative for neck pain.        Diffuse arthralgias. History of rheumatoid arthritis. Currently on Enbrel. Under the care of Dr. Amil Amen.  Skin: Negative for rash.       callus on heels.  Allergic/Immunologic: Negative for environmental allergies.  Neurological: Negative for dizziness, tremors, seizures, weakness and headaches.  Hematological: Does not bruise/bleed easily.  Psychiatric/Behavioral: Negative for hallucinations and suicidal ideas. The patient is nervous/anxious.     Vitals:   10/31/16 1354  BP: (!) 150/84  Pulse: 74  Temp: 98.6 F (37 C)  TempSrc: Oral  SpO2: 99%  Weight: 111 lb (50.3 kg)  Height: _0  (1.448 m)   Body mass index is 24.02 kg/m. Wt Readings from Last 3 Encounters:  10/31/16 111 lb (50.3 kg)  10/31/16 111 lb (50.3 kg)  06/20/16 101 lb (45.8 kg)      Physical Exam  Constitutional: She is oriented to person, place, and time. She appears well-developed and well-nourished.  HENT:  Head: Normocephalic.  Right Ear: External ear normal.  Mouth/Throat: No oropharyngeal exudate.  Eyes: Conjunctivae and EOM are normal. Pupils are equal, round, and reactive to light.  Neck: Neck supple. No tracheal deviation present. Thyromegaly present.  Cardiovascular: Normal rate, regular rhythm, normal heart sounds and intact distal pulses.   Pulmonary/Chest: Effort normal and breath sounds normal. No stridor.  Abdominal: Soft. Bowel sounds are normal.  Musculoskeletal: She exhibits no edema or tenderness.  Bilateral bunions and right hammer toe. Tender right SI joint. Chronic back discomfort with pain radiating down the right leg.  Lymphadenopathy:    She has no cervical adenopathy.  Neurological: She is oriented to person, place, and time. She has normal reflexes. No cranial nerve deficit. Coordination normal.  Skin: Skin is warm and dry. No rash noted.  Psychiatric: She has a normal mood and affect. Her behavior is normal. Judgment and thought content normal.    Labs reviewed: Lab  Summary Latest Ref Rng & Units 10/27/2016 02/02/2016  Hemoglobin 11.7 - 15.5 g/dL 13.5 13.0  Hematocrit 35.0 - 45.0 % 42.4 39.9  White count 3.8 - 10.8 K/uL 10.2 7.7  Platelet count 140 - 400 K/uL 291 (None)  Sodium 135 - 146 mmol/L 142 143  Potassium 3.5 - 5.3 mmol/L 3.9 4.1  Calcium 8.6 - 10.4 mg/dL 9.0 8.6(L)  Phosphorus - (None) (None)  Creatinine 0.50 - 0.99 mg/dL 0.81 0.66  AST 10 - 35 U/L 16 13  Alk Phos 33 - 130 U/L 39 57  Bilirubin 0.2 - 1.2 mg/dL 0.4 0.2  Glucose 65 - 99 mg/dL 83 81  Cholesterol - (None) (None)  HDL cholesterol - (None) (None)  Triglycerides - (None) (None)  LDL Direct - (None) (None)  LDL Calc - (None) (None)  Total protein 6.1 - 8.1 g/dL 6.4 (None)  Albumin 3.6 - 5.1 g/dL  3.9 3.5(L)  Some recent data might be hidden   Lab Results  Component Value Date   TSH 7.02 (H) 10/27/2016   TSH 5.45 06/05/2016   TSH 2.45 04/03/2016   THYROIDAB 28 12/03/2012   Lab Results  Component Value Date   BUN 24 10/27/2016   BUN 13 02/02/2016   BUN 18 10/04/2015   No results found for: HGBA1C  Assessment/Plan  1. Essential hypertension Continue current medications  2. Hypothyroidism, unspecified type Seeing Dr. Elyse Hsu - TSH; Future  3. Rheumatoid arthritis with positive rheumatoid factor, involving unspecified site [facility]) Seeing Dr. Amil Amen  4. Bilateral impacted cerumen Disimpacted with alligator forceps  5. Diarrhea, unspecified type resolved  6. Sciatica of right side associated with disorder of lumbosacral spine - Ambulatory referral to Neurosurgery - Ambulatory epidural steroid injection; Future

## 2016-10-31 NOTE — Patient Instructions (Signed)
Try Aspercream with lidocaine or Salonpas with lidocaine for pain relief

## 2016-10-31 NOTE — Progress Notes (Signed)
Quick Notes   Health Maintenance:  Pn23 given; Due for TDAP (please send in RX)    Abnormal Screen: MMSE 28/30; Passed Clock test    Patient Concerns:  Pt would like to have another injection in her back (last straw before surgery); pt would also like to have her left ear flushed out.     Nurse Concerns:  None  I have reviewed the information entered by the Health Advisor. I was present in the office during the time of patient interaction and was available for consultation. I agree with the documentation and advice.  Viviann Spare Nyoka Cowden, MD

## 2016-10-31 NOTE — Progress Notes (Signed)
Subjective:   Joan [name] is a 71 y.o. female who presents for an Initial Medicare Annual Wellness Visit.  Review of Systems      Cardiac Risk Factors include: advanced age (>54men, >78 women);family history of premature cardiovascular disease;sedentary lifestyle;hypertension     Objective:    Today's Vitals   10/31/16 1335  BP: (!) 150/84  Pulse: 74  Temp: 98.6 F (37 C)  TempSrc: Oral  SpO2: 99%  Weight: 111 lb (50.3 kg)  Height: 4' 8.5" (1.435 m)  PainSc: 6    Body mass index is 24.45 kg/m.   Current Medications (verified) Outpatient Encounter Prescriptions as of 10/31/2016  Medication Sig  . acetaminophen (TYLENOL) 500 MG tablet Take 500 mg by mouth every 6 (six) hours as needed for pain. Take two tablets up to four times a day as needed  . amitriptyline (ELAVIL) 10 MG tablet TAKE ONE TABLET BY MOUTH AT BEDTIME TO HELP RELAX MUSCLES  . ENBREL SURECLICK 50 MG/ML injection Inject 50 mg as directed once a week. Inject once weekly for arthritis  . fluticasone (FLONASE) 50 MCG/ACT nasal spray Place 1 spray into the nose daily.  . folic acid (FOLVITE) 1 MG tablet Take 1 mg by mouth daily. Take two tablets every morning.  . Glucosamine-Chondroit-Vit C-Mn (GLUCOSAMINE CHONDR 500 COMPLEX PO) Take 500 mg by mouth daily. Take 2 tablets daily for joint health.  . hydrochlorothiazide (MICROZIDE) 12.5 MG capsule TAKE ONE CAPSULE BY MOUTH ONCE DAILY FOR BLOOD PRESSURE  . Multiple Vitamin (MULTIVITAMIN) tablet Take 1 tablet by mouth daily. Take 1 tablet once a daily  . Omega-3 Fatty Acids (FISH OIL) 1000 MG CAPS Take 1,000 mg by mouth daily. Take one tablet once a daily for heart, joint and skin health.  . predniSONE (DELTASONE) 5 MG tablet Take 1/2 alt with 1 tablet by mouth daily with breakfast for arthritis  . raloxifene (EVISTA) 60 MG tablet TAKE ONE TABLET BY MOUTH ONCE DAILY TO  TREAT  OSTEOPOROSIS.  Marland Kitchen traMADol (ULTRAM) 50 MG tablet TAKE ONE OR TWO TABLETS BY MOUTH UP TO 4  TIMES DAILY (IN 24 HOURS) FOR PAIN  . urea (CARMOL) 20 % cream Apply to callus daily  . dicyclomine (BENTYL) 10 MG capsule Take 1 capsule (10 mg total) by mouth 3 (three) times daily before meals.  . diphenoxylate-atropine (LOMOTIL) 2.5-0.025 MG tablet TAKE ONE TABLET BY MOUTH 4 TIMES DAILY AS NEEDED FOR DIARRHEA OR  LOOSE  STOOLS.  Marland Kitchen gentamicin cream (GARAMYCIN) 0.1 % Apply Ointment into right ear twice daily.  . [DISCONTINUED] ciprofloxacin (CIPRO) 500 MG tablet Take 500 mg by mouth. Take one tablet twice daily for infection right ear x 10 days.  . [DISCONTINUED] colestipol (COLESTID) 1 G tablet Take 1 tablet (1 g total) by mouth 2 (two) times daily.  . [DISCONTINUED] diclofenac (VOLTAREN) 50 MG EC tablet One twice daily to help arthritis pain  . [DISCONTINUED] neomycin-polymyxin-hydrocortisone (CORTISPORIN) otic solution Place 2 drops in affected ear 4 times daily for infection  . [DISCONTINUED] ondansetron (ZOFRAN) 4 MG tablet Take every 6 hours as needed as directed by MD   No facility-administered encounter medications on file as of 10/31/2016.     Allergies (verified) Arava [leflunomide]; Methotrexate derivatives; Neurontin [gabapentin]; and Penicillins   History: Past Medical History:  Diagnosis Date  . Alopecia areata 02/16/2006  . Anxiety state, unspecified 2004  . Benign paroxysmal positional vertigo 04/01/03  . Cervicalgia 04/01/03  . Chronic rhinitis 09/01/05  . Corns and  callosities   . Dermatophytosis of nail   . Diarrhea 04/15/2015  . Diverticulosis   . Dysphagia, unspecified(787.20) 2004  . Enthesopathy of ankle and tarsus, unspecified 03/31/96  . External hemorrhoids without mention of complication   . 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 Fibromyalgia   . Hypertension 2004  . Hypothyroidism   . Impacted cerumen    right ear  . Lumbago   . Myalgia and myositis, unspecified 03/01/05  . Nontoxic uninodular goiter   . Osteoarthrosis, unspecified whether generalized or  localized, unspecified site 04/01/03  . Other left bundle branch block   . Rheumatoid arthritis(714.0) 10/05/2004   lumbar spondylosis. Lumbo-sacral anterolisthesis    . Senile osteoporosis 04/01/03  . Sprain of metatarsophalangeal (joint) of foot   . Thyrotoxicosis   . Thyrotoxicosis without mention of goiter or other cause, without mention of thyrotoxic crisis or storm   . Unspecified vitamin D deficiency   . Urinary frequency    Past Surgical History:  Procedure Laterality Date  . COLONOSCOPY    . COLONOSCOPY N/A 04/15/2015   Procedure: COLONOSCOPY;  Surgeon: Gatha Mayer, MD;  Location: [facility];  Service: Endoscopy;  Laterality: N/A;  . FOOT SURGERY Right 05/22/2014   Dr.Strom   . [place]   DR [place]   . TONSILLECTOMY AND ADENOIDECTOMY  1974  . TUBAL LIGATION  1973   Family History  Problem Relation Age of Onset  . Heart attack Father     age 65  . Heart disease Father    Social History   Occupational History  . Not on file.   Social History Main Topics  . Smoking status: Never Smoker  . Smokeless tobacco: Never Used  . Alcohol use No  . Drug use: No  . Sexual activity: Yes    Tobacco Counseling Counseling given: No   Activities of Daily Living In your present state of health, do you have any difficulty performing the following activities: 10/31/2016  Hearing? N  Vision? N  Difficulty concentrating or making decisions? N  Walking or climbing stairs? Y  Dressing or bathing? N  Doing errands, shopping? N  Preparing Food and eating ? N  Using the Toilet? N  In the past six months, have you accidently leaked urine? Y  Do you have problems with loss of bowel control? N  Managing your Medications? N  Managing your Finances? Y  Housekeeping or managing your Housekeeping? N  Some recent data might be hidden    Immunizations and Health Maintenance Immunization History  Administered  Date(s) Administered  . Influenza-Unspecified 06/04/2013, 07/06/2015, 06/13/2016  . Pneumococcal Conjugate-13 10/06/2015  . Pneumococcal Polysaccharide-23 10/31/2016  . Pneumococcal-Unspecified 09/04/2006  . Td 09/04/2005  . Zoster 11/10/2008   Health Maintenance Due  Topic Date Due  . Hepatitis C Screening  31-Jan-1946  . DEXA SCAN  09/01/2012    Patient Care Team: Estill Dooms, MD as PCP - General (Internal Medicine) Hennie Duos, MD as Consulting Physician (Rheumatology) Lorne Skeens, MD as Consulting Physician (Endocrinology) Newman Pies, MD as Consulting Physician (Neurosurgery) Clydell Hakim, MD as Consulting Physician (Anesthesiology) Rolla Flatten, MD as Consulting Physician (Diagnostic Radiology) Jolene Schimke, MD as Referring Physician (Dermatology) Lynnell Dike, OD as Consulting Physician (Optometry)  Indicate any recent Medical Services you may have received from other than [facility] providers in the past year (date may be approximate).  Assessment:   This is a routine wellness examination for Joan [name].   Hearing/Vision screen  Hearing Screening   125Hz  250Hz  500Hz  1000Hz  2000Hz  3000Hz  4000Hz  6000Hz  8000Hz   Right ear:   100 100 100  100    Left ear:   0 100 100  0    Comments: Pt unsure of last hearing screen. States her left ear is "stopped up" but other wise no problems with hearing loss at this time.   Vision Screening Comments: Last eye exam was done with Dr. Renaldo Fiddler in Nov. 2017. Denies any problems at this time, other than dry eye.   Dietary issues and exercise activities discussed: Current Exercise Habits: The patient does not participate in regular exercise at present, Exercise limited by: orthopedic condition(s)  Goals    . Increase water intake          Starting 10/31/16, I will attempt to increase my water intake by 2 more glasses daily, for a total of 6 glasses per day.      Depression Screen PHQ 2/9 Scores 10/31/2016 02/08/2016 06/02/2015  12/09/2014 07/22/2014 03/18/2014 01/15/2013  PHQ - 2 Score 0 0 0 0 0 0 0    Fall Risk Fall Risk  10/31/2016 02/08/2016 02/08/2016 10/06/2015 06/02/2015  Falls in the past year? Yes No No No No  Number falls in past yr: 1 - - - -  Injury with Fall? No - - - -  Follow up Falls prevention discussed - - - -    Cognitive Function: MMSE - Mini Mental State Exam 10/31/2016 10/31/2016  Orientation to time 5 5  Orientation to Place 5 5  Registration 3 -  Attention/ Calculation 5 -  Recall 2 -  Language- name 2 objects 2 -  Language- repeat 1 -  Language- follow 3 step command 2 -  Language- read & follow direction 1 -  Write a sentence 1 -  Copy design 1 -  Total score 28 -        Screening Tests Health Maintenance  Topic Date Due  . Hepatitis C Screening  Jun 09, 1946  . DEXA SCAN  09/01/2012  . TETANUS/TDAP  11/02/2017 (Originally 09/05/2015)  . MAMMOGRAM  02/07/2018  . COLONOSCOPY  04/14/2025  . INFLUENZA VACCINE  Completed  . PNA vac Low Risk Adult  Completed      Plan:    I have personally reviewed and addressed the Medicare Annual Wellness questionnaire and have noted the following in the patient's chart:  A. Medical and social history B. Use of alcohol, tobacco or illicit drugs  C. Current medications and supplements D. Functional ability and status E.  Nutritional status F.  Physical activity G. Advance directives H. List of other physicians I.  Hospitalizations, surgeries, and ER visits in previous 12 months J.  [place] to include hearing, vision, cognitive, depression L. Referrals and appointments - none  In addition, I have reviewed and discussed with patient certain preventive protocols, quality metrics, and best practice recommendations. A written personalized care plan for preventive services as well as general preventive health recommendations were provided to patient.  See attached scanned questionnaire for additional information.   Signed,   Allyn Kenner, LPN Health Advisor    I have reviewed the information entered by the Health Advisor. I was present in the office during the time of patient interaction and was available for consultation. I agree with the documentation and advice.  Viviann Spare Nyoka Cowden, MD

## 2016-11-07 ENCOUNTER — Other Ambulatory Visit: Payer: Self-pay | Admitting: *Deleted

## 2016-11-07 DIAGNOSIS — L84 Corns and callosities: Secondary | ICD-10-CM

## 2016-11-07 MED ORDER — UREA 20 % EX CREA: TOPICAL_CREAM | CUTANEOUS | 5 refills | Status: DC

## 2016-11-07 NOTE — Telephone Encounter (Signed)
Patient requested 

## 2016-11-14 ENCOUNTER — Telehealth: Payer: Self-pay

## 2016-11-14 NOTE — Telephone Encounter (Signed)
Patient called and asked if Dr Nyoka Cowden would place a referral to interventional radiology to have injections in her back. When asked for details of why referral was needed, patient stated that Dr. Nyoka Cowden has order these injections before to help her pain.   Please advise.

## 2016-11-15 ENCOUNTER — Other Ambulatory Visit: Payer: Self-pay | Admitting: Internal Medicine

## 2016-11-15 DIAGNOSIS — M5387 Other specified dorsopathies, lumbosacral region: Secondary | ICD-10-CM

## 2016-11-15 NOTE — Telephone Encounter (Signed)
I entered the order.

## 2016-11-15 NOTE — Telephone Encounter (Signed)
I left patient a message to let her know that an order for the requested back injections has been placed. I asked that if she has any question, to please call the office.

## 2016-11-20 DIAGNOSIS — E040 Nontoxic diffuse goiter: Secondary | ICD-10-CM | POA: Insufficient documentation

## 2016-11-20 DIAGNOSIS — E049 Nontoxic goiter, unspecified: Secondary | ICD-10-CM | POA: Insufficient documentation

## 2016-12-01 ENCOUNTER — Ambulatory Visit
Admission: RE | Admit: 2016-12-01 | Discharge: 2016-12-01 | Disposition: A | Discharge status: Home / Self Care | Payer: Medicare Other | Source: Ambulatory Visit | Attending: Internal Medicine | Admitting: Internal Medicine

## 2016-12-01 DIAGNOSIS — M5387 Other specified dorsopathies, lumbosacral region: Secondary | ICD-10-CM

## 2016-12-01 MED ORDER — METHYLPREDNISOLONE ACETATE 40 MG/ML INJ SUSP (RADIOLOG
120.0000 mg | Freq: Once | INTRAMUSCULAR | Status: AC
  Administered 2016-12-01: 120 mg via EPIDURAL

## 2016-12-01 MED ORDER — IOPAMIDOL (ISOVUE-M 200) INJECTION 41%
1.0000 mL | Freq: Once | INTRAMUSCULAR | Status: AC
  Administered 2016-12-01: 1 mL via EPIDURAL

## 2016-12-18 ENCOUNTER — Other Ambulatory Visit: Payer: Medicare Other

## 2016-12-20 ENCOUNTER — Ambulatory Visit: Payer: Medicare Other | Admitting: Internal Medicine

## 2016-12-27 ENCOUNTER — Encounter: Payer: Self-pay | Admitting: Internal Medicine

## 2016-12-27 ENCOUNTER — Other Ambulatory Visit: Payer: Self-pay | Admitting: Internal Medicine

## 2016-12-27 ENCOUNTER — Ambulatory Visit (INDEPENDENT_AMBULATORY_CARE_PROVIDER_SITE_OTHER): Payer: Medicare Other | Admitting: Internal Medicine

## 2016-12-27 VITALS — BP 132/72 | HR 70 | Temp 98.2°F | Ht <= 58 in | Wt 111.0 lb

## 2016-12-27 DIAGNOSIS — E041 Nontoxic single thyroid nodule: Secondary | ICD-10-CM | POA: Diagnosis not present

## 2016-12-27 DIAGNOSIS — R252 Cramp and spasm: Secondary | ICD-10-CM | POA: Insufficient documentation

## 2016-12-27 DIAGNOSIS — I10 Essential (primary) hypertension: Secondary | ICD-10-CM

## 2016-12-27 DIAGNOSIS — N6489 Other specified disorders of breast: Secondary | ICD-10-CM

## 2016-12-27 DIAGNOSIS — M5387 Other specified dorsopathies, lumbosacral region: Secondary | ICD-10-CM

## 2016-12-27 DIAGNOSIS — Z1231 Encounter for screening mammogram for malignant neoplasm of breast: Secondary | ICD-10-CM

## 2016-12-27 DIAGNOSIS — M539 Dorsopathy, unspecified: Secondary | ICD-10-CM

## 2016-12-27 DIAGNOSIS — E039 Hypothyroidism, unspecified: Secondary | ICD-10-CM

## 2016-12-27 DIAGNOSIS — M059 Rheumatoid arthritis with rheumatoid factor, unspecified: Secondary | ICD-10-CM | POA: Diagnosis not present

## 2016-12-27 DIAGNOSIS — L821 Other seborrheic keratosis: Secondary | ICD-10-CM | POA: Insufficient documentation

## 2016-12-27 MED ORDER — FOLIC ACID 1 MG PO TABS: ORAL_TABLET | ORAL | 2 refills | Status: DC

## 2016-12-27 MED ORDER — FLUTICASONE PROPIONATE 50 MCG/ACT NA SUSP: NASAL | 4 refills | Status: DC

## 2016-12-27 NOTE — Progress Notes (Signed)
Facility  [facility]    Place of Service:   OFFICE    Allergies  Allergen Reactions  . Arava [Leflunomide] Diarrhea, Nausea Only and Other (See Comments)    Loss appetite  . Methotrexate Derivatives Other (See Comments)    Hair loss  . Neurontin [Gabapentin] Nausea And Vomiting  . Penicillins Rash    Chief Complaint  Patient presents with  . Medical Management of Chronic Issues    wants a follow up visit with Dr. Nyoka Cowden before he leaves.  . legs    cramping at night  . skin    spots, dark spot on abd, red spot on chest  . nipple    left nipple has crust on it, took it off once it bleed.     HPI:   Nipple crusting - tip of the left nipple for about 1 month  Leg cramps - up at nght frequently for about the ;last month  Seborrheic keratosis - abd close to navel  Rheumatoid arthritis with positive rheumatoid factor, involving unspecified site ([place]) - contines with rheumatologist  Essential hypertension - controlled  Hypothyroidism, unspecified type - continues with Dr. Lesly Dukes  Nontoxic uninodular goiter - controlled  Chronic back pains with right sciatica. Injections have been helpful in symptom control.    Medications: Patient's Medications  New Prescriptions   No medications on file  Previous Medications   ACETAMINOPHEN (TYLENOL) 500 MG TABLET    Take 500 mg by mouth every 6 (six) hours as needed for pain. Take two tablets up to four times a day as needed   AMITRIPTYLINE (ELAVIL) 10 MG TABLET    TAKE ONE TABLET BY MOUTH AT BEDTIME TO HELP RELAX MUSCLES   DICYCLOMINE (BENTYL) 10 MG CAPSULE    Take 1 capsule (10 mg total) by mouth 3 (three) times daily before meals.   DIPHENOXYLATE-ATROPINE (LOMOTIL) 2.5-0.025 MG TABLET    TAKE ONE TABLET BY MOUTH 4 TIMES DAILY AS NEEDED FOR DIARRHEA OR  LOOSE  STOOLS.   ENBREL SURECLICK 50 MG/ML INJECTION    Inject 50 mg as directed once a week. Inject once weekly for arthritis   GENTAMICIN CREAM (GARAMYCIN) 0.1 %    Apply  Ointment into right ear twice daily.   GLUCOSAMINE-CHONDROIT-VIT C-MN (GLUCOSAMINE CHONDR 500 COMPLEX PO)    Take 500 mg by mouth daily. Take 2 tablets daily for joint health.   HYDROCHLOROTHIAZIDE (MICROZIDE) 12.5 MG CAPSULE    TAKE ONE CAPSULE BY MOUTH ONCE DAILY FOR BLOOD PRESSURE   MULTIPLE VITAMIN (MULTIVITAMIN) TABLET    Take 1 tablet by mouth daily. Take 1 tablet once a daily   OMEGA-3 FATTY ACIDS (FISH OIL) 1000 MG CAPS    Take 1,000 mg by mouth daily. Take one tablet once a daily for heart, joint and skin health.   PREDNISONE (DELTASONE) 5 MG TABLET    Take 5 mg by mouth. Take one tablet daily with breakfast for arthritis   RALOXIFENE (EVISTA) 60 MG TABLET    TAKE ONE TABLET BY MOUTH ONCE DAILY TO  TREAT  OSTEOPOROSIS.   TRAMADOL (ULTRAM) 50 MG TABLET    TAKE ONE OR TWO TABLETS BY MOUTH UP TO 4 TIMES DAILY (IN 24 HOURS) FOR PAIN   UREA (CARMOL) 20 % CREAM    Apply to callus daily  Modified Medications   Modified Medication Previous Medication   FLUTICASONE (FLONASE) 50 MCG/ACT NASAL SPRAY fluticasone (FLONASE) 50 MCG/ACT nasal spray      Place 1 spray  into the nose daily.    Place 1 spray into the nose daily.   FOLIC ACID (FOLVITE) 1 MG TABLET folic acid (FOLVITE) 1 MG tablet      Take one tablet every morning.    Take 1 mg by mouth. Take one tablet in morning  Discontinued Medications   PREDNISONE (DELTASONE) 5 MG TABLET    Take 1/2 alt with 1 tablet by mouth daily with breakfast for arthritis    Review of Systems  Constitutional: Negative for chills, fever and unexpected weight change.  HENT: Negative for congestion, ear discharge, ear pain, hearing loss, sore throat and tinnitus.   Eyes: Negative for photophobia and pain.  Respiratory: Negative for cough, shortness of breath and wheezing.   Cardiovascular: Negative for chest pain, palpitations and leg swelling.  Gastrointestinal: Positive for diarrhea. Negative for abdominal pain, constipation, nausea and vomiting.  Endocrine:  Negative for polydipsia.  Genitourinary: Negative for dysuria, frequency and urgency.  Musculoskeletal: Positive for back pain (Chronic low back discomfort with pain radiating down the right leg). Negative for neck pain.       Diffuse arthralgias. History of rheumatoid arthritis. Currently on Enbrel. Under the care of Dr. Amil Amen. Nocturnal leg cramps April 2018.  Skin: Negative for rash.       callus on heels.  Allergic/Immunologic: Negative for environmental allergies.  Neurological: Negative for dizziness, tremors, seizures, weakness and headaches.  Hematological: Does not bruise/bleed easily.  Psychiatric/Behavioral: Negative for hallucinations and suicidal ideas. The patient is nervous/anxious.     Vitals:   12/27/16 1125  BP: 132/72  Pulse: 70  Temp: 98.2 F (36.8 C)  TempSrc: Oral  SpO2: 96%  Weight: 111 lb (50.3 kg)  Height: 4' 9"  (1.448 m)   Body mass index is 24.02 kg/m. Wt Readings from Last 3 Encounters:  12/27/16 111 lb (50.3 kg)  10/31/16 111 lb (50.3 kg)  10/31/16 111 lb (50.3 kg)      Physical Exam  Constitutional: She is oriented to person, place, and time. She appears well-developed and well-nourished.  HENT:  Head: Normocephalic.  Right Ear: External ear normal.  Mouth/Throat: No oropharyngeal exudate.  Eyes: Conjunctivae and EOM are normal. Pupils are equal, round, and reactive to light.  Neck: Neck supple. No tracheal deviation present. Thyromegaly present.  Cardiovascular: Normal rate, regular rhythm, normal heart sounds and intact distal pulses.   Pulmonary/Chest: Effort normal and breath sounds normal. No stridor.  Abdominal: Soft. Bowel sounds are normal.  Musculoskeletal: She exhibits no edema or tenderness.  Bilateral bunions and right hammer toe. Tender right SI joint. Chronic back discomfort with pain radiating down the right leg.  Lymphadenopathy:    She has no cervical adenopathy.  Neurological: She is oriented to person, place, and  time. She has normal reflexes. No cranial nerve deficit. Coordination normal.  Skin: Skin is warm and dry. No rash noted.  Crusting at the tip of the left nipple. No palpable mass or nodule. No adenopathy. Not purulent.  Psychiatric: She has a normal mood and affect. Her behavior is normal. Judgment and thought content normal.    Labs reviewed: Lab Summary Latest Ref Rng & Units 10/27/2016 02/02/2016  Hemoglobin 11.7 - 15.5 g/dL 13.5 13.0  Hematocrit 35.0 - 45.0 % 42.4 39.9  White count 3.8 - 10.8 K/uL 10.2 7.7  Platelet count 140 - 400 K/uL 291 (None)  Sodium 135 - 146 mmol/L 142 143  Potassium 3.5 - 5.3 mmol/L 3.9 4.1  Calcium 8.6 - 10.4 mg/dL  9.0 8.6(L)  Phosphorus - (None) (None)  Creatinine 0.50 - 0.99 mg/dL 0.81 0.66  AST 10 - 35 U/L 16 13  Alk Phos 33 - 130 U/L 39 57  Bilirubin 0.2 - 1.2 mg/dL 0.4 0.2  Glucose 65 - 99 mg/dL 83 81  Cholesterol - (None) (None)  HDL cholesterol - (None) (None)  Triglycerides - (None) (None)  LDL Direct - (None) (None)  LDL Calc - (None) (None)  Total protein 6.1 - 8.1 g/dL 6.4 (None)  Albumin 3.6 - 5.1 g/dL 3.9 3.5(L)  Some recent data might be hidden   Lab Results  Component Value Date   TSH 7.02 (H) 10/27/2016   TSH 5.45 06/05/2016   TSH 2.45 04/03/2016   THYROIDAB 28 12/03/2012   Lab Results  Component Value Date   BUN 24 10/27/2016   BUN 13 02/02/2016   BUN 18 10/04/2015   No results found for: HGBA1C  Assessment/Plan  1. Nipple crusting Possible Bowen's disease If mammography is without deeper disease, I think referral to dermatology is reasonable. - MM Digital Diagnostic Bilat; Future  2. Leg cramps Increase fluid intake  3. Seborrheic keratosis benign  4. Rheumatoid arthritis with positive rheumatoid factor, involving unspecified site [facility]) Continue resent medications  5. Essential hypertension controlled  6. Hypothyroidism, unspecified type controlled  7. Nontoxic uninodular goiter controlled  8.  Sciatica of right side associated with disorder of lumbosacral spine - DG INJECT DIAG/THERA/INC NEEDLE/CATH/PLC EPI/LUMB/SAC W/IMG; Future

## 2017-01-03 ENCOUNTER — Ambulatory Visit
Admission: RE | Admit: 2017-01-03 | Discharge: 2017-01-03 | Disposition: A | Discharge status: Home / Self Care | Payer: Medicare Other | Source: Ambulatory Visit | Attending: Internal Medicine | Admitting: Internal Medicine

## 2017-01-03 DIAGNOSIS — N6489 Other specified disorders of breast: Secondary | ICD-10-CM

## 2017-01-15 ENCOUNTER — Other Ambulatory Visit: Payer: Self-pay | Admitting: Internal Medicine

## 2017-01-15 DIAGNOSIS — M059 Rheumatoid arthritis with rheumatoid factor, unspecified: Secondary | ICD-10-CM

## 2017-01-26 ENCOUNTER — Other Ambulatory Visit: Payer: Self-pay | Admitting: Internal Medicine

## 2017-01-26 ENCOUNTER — Ambulatory Visit
Admission: RE | Admit: 2017-01-26 | Discharge: 2017-01-26 | Disposition: A | Discharge status: Home / Self Care | Payer: Medicare Other | Source: Ambulatory Visit | Attending: Internal Medicine | Admitting: Internal Medicine

## 2017-01-26 ENCOUNTER — Other Ambulatory Visit: Payer: Medicare Other

## 2017-01-26 DIAGNOSIS — M5387 Other specified dorsopathies, lumbosacral region: Secondary | ICD-10-CM

## 2017-01-26 MED ORDER — IOPAMIDOL (ISOVUE-M 200) INJECTION 41%
1.0000 mL | Freq: Once | INTRAMUSCULAR | Status: AC
  Administered 2017-01-26: 1 mL via EPIDURAL

## 2017-01-26 MED ORDER — METHYLPREDNISOLONE ACETATE 40 MG/ML INJ SUSP (RADIOLOG
120.0000 mg | Freq: Once | INTRAMUSCULAR | Status: AC
  Administered 2017-01-26: 120 mg via EPIDURAL

## 2017-01-26 NOTE — Discharge Instructions (Signed)

## 2017-01-31 LAB — CBC AND DIFFERENTIAL
HCT: 42 (ref 36–46)
Hemoglobin: 13.4 (ref 12.0–16.0)
PLATELETS: 321 (ref 150–399)
WBC: 15.1

## 2017-02-02 ENCOUNTER — Other Ambulatory Visit: Payer: Self-pay | Admitting: Physician Assistant

## 2017-02-02 DIAGNOSIS — M810 Age-related osteoporosis without current pathological fracture: Secondary | ICD-10-CM

## 2017-02-08 LAB — CBC AND DIFFERENTIAL
HEMATOCRIT: 40 (ref 36–46)
HEMOGLOBIN: 13.2 (ref 12.0–16.0)
NEUTROS ABS: 10
Platelets: 308 (ref 150–399)
WBC: 14.1

## 2017-02-09 ENCOUNTER — Ambulatory Visit: Payer: Medicare Other

## 2017-02-12 ENCOUNTER — Ambulatory Visit: Payer: Medicare Other

## 2017-02-13 ENCOUNTER — Encounter: Payer: Self-pay | Admitting: Internal Medicine

## 2017-02-13 ENCOUNTER — Ambulatory Visit (INDEPENDENT_AMBULATORY_CARE_PROVIDER_SITE_OTHER): Payer: Medicare Other | Admitting: Internal Medicine

## 2017-02-13 ENCOUNTER — Ambulatory Visit
Admission: RE | Admit: 2017-02-13 | Discharge: 2017-02-13 | Disposition: A | Discharge status: Home / Self Care | Payer: Medicare Other | Source: Ambulatory Visit | Attending: Internal Medicine | Admitting: Internal Medicine

## 2017-02-13 VITALS — BP 132/78 | HR 70 | Temp 98.4°F | Ht <= 58 in | Wt 108.6 lb

## 2017-02-13 DIAGNOSIS — E039 Hypothyroidism, unspecified: Secondary | ICD-10-CM | POA: Diagnosis not present

## 2017-02-13 DIAGNOSIS — I10 Essential (primary) hypertension: Secondary | ICD-10-CM | POA: Diagnosis not present

## 2017-02-13 DIAGNOSIS — E041 Nontoxic single thyroid nodule: Secondary | ICD-10-CM | POA: Diagnosis not present

## 2017-02-13 DIAGNOSIS — Z780 Asymptomatic menopausal state: Secondary | ICD-10-CM | POA: Insufficient documentation

## 2017-02-13 DIAGNOSIS — M059 Rheumatoid arthritis with rheumatoid factor, unspecified: Secondary | ICD-10-CM

## 2017-02-13 DIAGNOSIS — N6489 Other specified disorders of breast: Secondary | ICD-10-CM

## 2017-02-13 DIAGNOSIS — Z1231 Encounter for screening mammogram for malignant neoplasm of breast: Secondary | ICD-10-CM

## 2017-02-13 NOTE — Progress Notes (Signed)
Facility  [facility]    Place of Service:   OFFICE    Allergies  Allergen Reactions  . Arava [Leflunomide] Diarrhea, Nausea Only and Other (See Comments)    Loss appetite  . Methotrexate Derivatives Other (See Comments)    Hair loss  . Neurontin [Gabapentin] Nausea And Vomiting  . Penicillins Rash    Chief Complaint  Patient presents with  . Medical Management of Chronic Issues    Medical management of Nipple crusting, Leg Cramps,Thyroid,HTN and Sciatica    HPI:  Nipple crusting - normal mammogram. Continues with crusting of the tip of the left breast. Saw Dr. Jerelyn Charles, derm in [place]. There has been no response of the crusted area to use of Nizoral and TCA cream that he recommended.   Essential hypertension - controlled  Medications: Patient's Medications  New Prescriptions   No medications on file  Previous Medications   ACETAMINOPHEN (TYLENOL) 500 MG TABLET    Take 500 mg by mouth every 6 (six) hours as needed for pain. Take two tablets up to four times a day as needed   AMITRIPTYLINE (ELAVIL) 10 MG TABLET    TAKE ONE TABLET BY MOUTH AT BEDTIME TO HELP RELAX MUSCLES   DICYCLOMINE (BENTYL) 10 MG CAPSULE    Take 1 capsule (10 mg total) by mouth 3 (three) times daily before meals.   DIPHENOXYLATE-ATROPINE (LOMOTIL) 2.5-0.025 MG TABLET    TAKE ONE TABLET BY MOUTH 4 TIMES DAILY AS NEEDED FOR DIARRHEA OR  LOOSE  STOOLS.   ENBREL SURECLICK 50 MG/ML INJECTION    Inject 50 mg as directed once a week. Inject once weekly for arthritis   FLUTICASONE (FLONASE) 50 MCG/ACT NASAL SPRAY    Place 1 spray into the nose daily.   FOLIC ACID (FOLVITE) 1 MG TABLET    Take one tablet every morning.   GENTAMICIN CREAM (GARAMYCIN) 0.1 %    Apply Ointment into right ear twice daily.   GLUCOSAMINE-CHONDROIT-VIT C-MN (GLUCOSAMINE CHONDR 500 COMPLEX PO)    Take 500 mg by mouth daily. Take 2 tablets daily for joint health.   HYDROCHLOROTHIAZIDE (MICROZIDE) 12.5 MG CAPSULE    TAKE ONE CAPSULE BY MOUTH  ONCE DAILY FOR BLOOD PRESSURE   MULTIPLE VITAMIN (MULTIVITAMIN) TABLET    Take 1 tablet by mouth daily. Take 1 tablet once a daily   NYSTATIN CREAM (MYCOSTATIN)    Apply 1 application topically 2 (two) times daily. To left breast   OMEGA-3 FATTY ACIDS (FISH OIL) 1000 MG CAPS    Take 1,000 mg by mouth daily. Take one tablet once a daily for heart, joint and skin health.   PREDNISONE (DELTASONE) 5 MG TABLET    Take 5 mg by mouth. Take one tablet daily with breakfast for arthritis   RALOXIFENE (EVISTA) 60 MG TABLET    TAKE ONE TABLET BY MOUTH ONCE DAILY TO  TREAT  OSTEOPOROSIS.   TRAMADOL (ULTRAM) 50 MG TABLET    TAKE 1 TO 2 TABLETS BY MOUTH UP TO 4 TIMES DAILY AS NEEDED FOR PAIN   TRIAMCINOLONE CREAM (KENALOG) 0.1 %    Apply 1 application topically 2 (two) times daily. To left breast   UREA (CARMOL) 20 % CREAM    Apply to callus daily  Modified Medications   No medications on file  Discontinued Medications   No medications on file    Review of Systems  Constitutional: Negative for chills, fever and unexpected weight change.  HENT: Negative for congestion, ear discharge, ear  pain, hearing loss, sore throat and tinnitus.   Eyes: Negative for photophobia and pain.  Respiratory: Negative for cough, shortness of breath and wheezing.   Cardiovascular: Negative for chest pain, palpitations and leg swelling.  Gastrointestinal: Positive for diarrhea. Negative for abdominal pain, constipation, nausea and vomiting.  Endocrine: Negative for polydipsia.  Genitourinary: Negative for dysuria, frequency and urgency.  Musculoskeletal: Positive for back pain (Chronic low back discomfort with pain radiating down the right leg). Negative for neck pain.       Diffuse arthralgias. History of rheumatoid arthritis. Currently on Enbrel. Under the care of Dr. Amil Amen. Nocturnal leg cramps April 2018.  Skin: Negative for rash.       callus on heels.  Allergic/Immunologic: Negative for environmental allergies.    Neurological: Negative for dizziness, tremors, seizures, weakness and headaches.  Hematological: Does not bruise/bleed easily.  Psychiatric/Behavioral: Negative for hallucinations and suicidal ideas. The patient is nervous/anxious.     Vitals:   02/13/17 1243  BP: 132/78  Pulse: 70  Temp: 98.4 F (36.9 C)  TempSrc: Oral  Weight: 108 lb 9.6 oz (49.3 kg)  Height: 4' 9"  (1.448 m)   Body mass index is 23.5 kg/m. Wt Readings from Last 3 Encounters:  02/13/17 108 lb 9.6 oz (49.3 kg)  12/27/16 111 lb (50.3 kg)  10/31/16 111 lb (50.3 kg)      Physical Exam  Constitutional: She is oriented to person, place, and time. She appears well-developed and well-nourished.  HENT:  Head: Normocephalic.  Right Ear: External ear normal.  Mouth/Throat: No oropharyngeal exudate.  Eyes: Conjunctivae and EOM are normal. Pupils are equal, round, and reactive to light.  Neck: Neck supple. No tracheal deviation present. Thyromegaly present.  Cardiovascular: Normal rate, regular rhythm, normal heart sounds and intact distal pulses.   Pulmonary/Chest: Effort normal and breath sounds normal. No stridor.  Abdominal: Soft. Bowel sounds are normal.  Musculoskeletal: She exhibits no edema or tenderness.  Bilateral bunions and right hammer toe. Tender right SI joint. Chronic back discomfort with pain radiating down the right leg.  Lymphadenopathy:    She has no cervical adenopathy.  Neurological: She is oriented to person, place, and time. She has normal reflexes. No cranial nerve deficit. Coordination normal.  Skin: Skin is warm and dry. Rash noted.  Crusting at the tip of the left nipple. No palpable mass or nodule. No adenopathy. Not purulent. Crusted area of the left nipple.  Psychiatric: She has a normal mood and affect. Her behavior is normal. Judgment and thought content normal.    Labs reviewed: Lab Summary Latest Ref Rng & Units 02/08/2017 01/31/2017 10/27/2016  Hemoglobin 12.0 - 16.0 13.2 13.4  13.5  Hematocrit 36 - 46 40 42 42.4  White count - 14.1 15.1 10.2  Platelet count 150 - 399 308 321 291  Sodium 135 - 146 mmol/L (None) (None) 142  Potassium 3.5 - 5.3 mmol/L (None) (None) 3.9  Calcium 8.6 - 10.4 mg/dL (None) (None) 9.0  Phosphorus - (None) (None) (None)  Creatinine 0.50 - 0.99 mg/dL (None) (None) 0.81  AST 10 - 35 U/L (None) (None) 16  Alk Phos 33 - 130 U/L (None) (None) 39  Bilirubin 0.2 - 1.2 mg/dL (None) (None) 0.4  Glucose 65 - 99 mg/dL (None) (None) 83  Cholesterol - (None) (None) (None)  HDL cholesterol - (None) (None) (None)  Triglycerides - (None) (None) (None)  LDL Direct - (None) (None) (None)  LDL Calc - (None) (None) (None)  Total protein 6.1 -  8.1 g/dL (None) (None) 6.4  Albumin 3.6 - 5.1 g/dL (None) (None) 3.9  Some recent data might be hidden   Lab Results  Component Value Date   TSH 7.02 (H) 10/27/2016   TSH 5.45 06/05/2016   TSH 2.45 04/03/2016   THYROIDAB 28 12/03/2012   Lab Results  Component Value Date   BUN 24 10/27/2016   BUN 13 02/02/2016   BUN 18 10/04/2015   No results found for: HGBA1C  Assessment/Plan 1. Nipple crusting Suspicious for Bowen's disease - Ambulatory referral to Dermatology  2. Essential hypertension controlled - Comprehensive metabolic panel; Future - Lipid panel; Future  3. Hypothyroidism, unspecified type - TSH; Future  4. Nontoxic uninodular goiter - TSH; Future  5. Rheumatoid arthritis with positive rheumatoid factor, involving unspecified site ([place]) - CBC with Differential/Platelet; Future

## 2017-02-19 ENCOUNTER — Ambulatory Visit
Admission: RE | Admit: 2017-02-19 | Discharge: 2017-02-19 | Disposition: A | Discharge status: Home / Self Care | Payer: Medicare Other | Source: Ambulatory Visit | Attending: Physician Assistant | Admitting: Physician Assistant

## 2017-02-19 DIAGNOSIS — M810 Age-related osteoporosis without current pathological fracture: Secondary | ICD-10-CM

## 2017-03-06 NOTE — Addendum Note (Signed)
Addended by: Royann Shivers A on: 03/06/2017 03:13 PM   Modules accepted: Orders

## 2017-03-22 ENCOUNTER — Other Ambulatory Visit: Payer: Self-pay | Admitting: Internal Medicine

## 2017-03-22 DIAGNOSIS — I10 Essential (primary) hypertension: Secondary | ICD-10-CM

## 2017-03-30 ENCOUNTER — Other Ambulatory Visit: Payer: Self-pay | Admitting: Internal Medicine

## 2017-03-30 ENCOUNTER — Telehealth: Payer: Self-pay | Admitting: *Deleted

## 2017-03-30 DIAGNOSIS — M059 Rheumatoid arthritis with rheumatoid factor, unspecified: Secondary | ICD-10-CM

## 2017-03-30 NOTE — Telephone Encounter (Signed)
Patient calling asking for referral to Neurosurgery or interventional radiology for a " back injection"  Per pt Dr. Nyoka Cowden has done this for years. Please advise  ( you have not seen patient yet)

## 2017-03-30 NOTE — Telephone Encounter (Signed)
.  left message to have patient return my call.  

## 2017-03-30 NOTE — Telephone Encounter (Signed)
I'd like her to be seen first though this is likely quite appropriate.  If I don't have anything, maybe Janett Billow can see her next week for her back pain.

## 2017-04-02 ENCOUNTER — Ambulatory Visit (INDEPENDENT_AMBULATORY_CARE_PROVIDER_SITE_OTHER): Payer: Medicare Other | Admitting: Internal Medicine

## 2017-04-02 ENCOUNTER — Encounter: Payer: Self-pay | Admitting: Internal Medicine

## 2017-04-02 VITALS — BP 140/80 | HR 81 | Temp 98.7°F | Wt 109.0 lb

## 2017-04-02 DIAGNOSIS — M5387 Other specified dorsopathies, lumbosacral region: Secondary | ICD-10-CM

## 2017-04-02 DIAGNOSIS — M419 Scoliosis, unspecified: Secondary | ICD-10-CM | POA: Diagnosis not present

## 2017-04-02 DIAGNOSIS — H3562 Retinal hemorrhage, left eye: Secondary | ICD-10-CM

## 2017-04-02 DIAGNOSIS — M059 Rheumatoid arthritis with rheumatoid factor, unspecified: Secondary | ICD-10-CM

## 2017-04-02 DIAGNOSIS — M539 Dorsopathy, unspecified: Secondary | ICD-10-CM

## 2017-04-02 DIAGNOSIS — D72825 Bandemia: Secondary | ICD-10-CM

## 2017-04-02 DIAGNOSIS — H1132 Conjunctival hemorrhage, left eye: Secondary | ICD-10-CM

## 2017-04-02 LAB — CBC WITH DIFFERENTIAL/PLATELET
Basophils Absolute: 0 cells/uL (ref 0–200)
Basophils Relative: 0 %
Eosinophils Absolute: 100 cells/uL (ref 15–500)
Eosinophils Relative: 1 %
HCT: 37.9 % (ref 35.0–45.0)
Hemoglobin: 12.3 g/dL (ref 11.7–15.5)
Lymphocytes Relative: 17 %
Lymphs Abs: 1700 cells/uL (ref 850–3900)
MCH: 31.8 pg (ref 27.0–33.0)
MCHC: 32.5 g/dL (ref 32.0–36.0)
MCV: 97.9 fL (ref 80.0–100.0)
MPV: 10.6 fL (ref 7.5–12.5)
Monocytes Absolute: 900 cells/uL (ref 200–950)
Monocytes Relative: 9 %
Neutro Abs: 7300 cells/uL (ref 1500–7800)
Neutrophils Relative %: 73 %
Platelets: 328 10*3/uL (ref 140–400)
RBC: 3.87 MIL/uL (ref 3.80–5.10)
RDW: 13.3 % (ref 11.0–15.0)
WBC: 10 10*3/uL (ref 3.8–10.8)

## 2017-04-02 NOTE — Progress Notes (Addendum)
Location:  [facility] clinic Provider: Moraima Burd L. Mariea Clonts, D.O., C.M.D.  Code Status: full code Goals of Care:  Advanced Directives 12/27/2016  Does Patient Have a Medical Advance Directive? No  Would patient like information on creating a medical advance directive? -   Chief Complaint  Patient presents with  . Acute Visit    to discuss back injection    HPI: Patient is a 71 y.o. female seen today for an acute visit for back pain.  She had called requesting a back injection referral. Apparently this has been routine in the past.  I have not met the patient so I requested she come in to be seen before I placed this order.  She has seen Francetta Found from neurosurgery in the past.  She's been on the lidoderm patch 5% for her chronic right sided low back pain with sciatica and scoliosis due to degenerative disease of the spine.  Surgery was previously recommended by Dr. Megan Salon.  In Feb of this year, PT, exercise and aquatherapy were recommended along with the lidoderm patches.  She also tried the otc version but not as effective.  She told Dr. Nyoka Cowden in Feb that she'd had good success with epidural injections before and was referred for another one at that point.  She also has rheumatoid arthritis with positive rheumatoid factor.  She is on tramadol and uses up to 8 per day as per Dr. Nyoka Cowden per pt's report.   She's also on tylenol, elavil, glucosamine/chondroitin, prednisone 43m daily.  She sees Dr. BAmil Amenfor her RA.  She says her bone density was reported to be bad at rheumatology--fosamax and actonel both made her sick to her stomach and achy all over.  She is probably going to get an injection instead.    Reports she tried to taper off prednisone and did not have success--got flare in her left hand.  Does have deformity of joints.  Was told she could have another injection 6/20.  [place]-S1.  Pain starts in her right buttock and down  her leg to her ankle.  Has had this for a few years now.  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      She had nipple crusting which wound up being spongiotic dermatitis.   Is to keep vaseline on it.  It is better now.  She is continuing to use the one cream prescribed by derm in [place].  Past Medical History:  Diagnosis Date  . Alopecia areata 02/16/2006  . Anxiety state, unspecified 2004  . Benign paroxysmal positional vertigo 04/01/03  . Cervicalgia 04/01/03  . Chronic rhinitis 09/01/05  . Corns and callosities   . Dermatophytosis of nail   . Diarrhea 04/15/2015  . Diverticulosis   . Dysphagia, unspecified(787.20) 2004  . Enthesopathy of ankle and tarsus, unspecified 03/31/96  . External hemorrhoids without mention of complication   . 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 Fibromyalgia   . Hypertension 2004  . Hypothyroidism   . Impacted cerumen    right ear  . Lumbago   . Myalgia and myositis, unspecified 03/01/05  . Nontoxic uninodular goiter   . Osteoarthrosis, unspecified whether generalized or localized, unspecified site 04/01/03  . Other left bundle branch block   . Rheumatoid arthritis(714.0) 10/05/2004   lumbar spondylosis. Lumbo-sacral anterolisthesis    . Senile osteoporosis 04/01/03  . Sprain of metatarsophalangeal (joint) of  foot   . Thyrotoxicosis   . Thyrotoxicosis without mention of goiter or other cause, without mention of thyrotoxic crisis or storm   . Unspecified vitamin D deficiency   . Urinary frequency     Past Surgical History:  Procedure Laterality Date  . COLONOSCOPY    . COLONOSCOPY N/A 04/15/2015   Procedure: COLONOSCOPY;  Surgeon: Gatha Mayer, MD;  Location: [facility];  Service: Endoscopy;  Laterality: N/A;  . FOOT SURGERY Right 05/22/2014   Dr.Strom   . [place]   DR [place]   . TONSILLECTOMY AND ADENOIDECTOMY  1974  . TUBAL LIGATION   1973    Allergies  Allergen Reactions  . Arava [Leflunomide] Diarrhea, Nausea Only and Other (See Comments)    Loss appetite  . Methotrexate Derivatives Other (See Comments)    Hair loss  . Neurontin [Gabapentin] Nausea And Vomiting  . Penicillins Rash    Allergies as of 04/02/2017      Reactions   Arava [leflunomide] Diarrhea, Nausea Only, Other (See Comments)   Loss appetite   Methotrexate Derivatives Other (See Comments)   Hair loss   Neurontin [gabapentin] Nausea And Vomiting   Penicillins Rash      Medication List       Accurate as of 04/02/17  1:39 PM. Always use your most recent med list.          amitriptyline 10 MG tablet Commonly known as:  ELAVIL TAKE ONE TABLET BY MOUTH AT BEDTIME TO HELP RELAX MUSCLES   ENBREL SURECLICK 50 MG/ML injection Generic drug:  etanercept Inject 50 mg as directed once a week. Inject once weekly for arthritis   Fish Oil 1000 MG Caps Take 1,000 mg by mouth daily. Take one tablet once a daily for heart, joint and skin health.   fluticasone 50 MCG/ACT nasal spray Commonly known as:  FLONASE Place 1 spray into the nose daily.   folic acid 1 MG tablet Commonly known as:  FOLVITE Take one tablet every morning.   GLUCOSAMINE CHONDR 500 COMPLEX PO Take 500 mg by mouth daily. Take 2 tablets daily for joint health.   hydrochlorothiazide 12.5 MG capsule Commonly known as:  MICROZIDE TAKE ONE CAPSULE BY MOUTH ONCE DAILY FOR BLOOD PRESSURE   multivitamin tablet Take 1 tablet by mouth daily. Take 1 tablet once a daily   predniSONE 5 MG tablet Commonly known as:  DELTASONE Take 5 mg by mouth. Take one tablet daily with breakfast for arthritis   raloxifene 60 MG tablet Commonly known as:  EVISTA TAKE ONE TABLET BY MOUTH ONCE DAILY TO  TREAT  OSTEOPOROSIS.   traMADol 50 MG tablet Commonly known as:  ULTRAM TAKE 1-2 TABLETS BY MOUTH UP TO FOUR TIMES DAILY AS NEEDED FOR PAIN.   TYLENOL 500 MG tablet Generic drug:   acetaminophen Take 500 mg by mouth every 6 (six) hours as needed for pain. Take two tablets up to four times a day as needed   urea 20 % cream Commonly known as:  CARMOL Apply to callus daily       Review of Systems:  Review of Systems  Constitutional: Positive for malaise/fatigue. Negative for chills and fever.  HENT: Negative for congestion.   Eyes: Positive for redness.  Respiratory: Negative for shortness of breath.   Cardiovascular: Negative for chest pain, palpitations and leg swelling.  Gastrointestinal: Negative for abdominal pain, blood in  stool, constipation and melena.  Genitourinary: Negative for dysuria.  Musculoskeletal: Positive for back pain and joint pain. Negative for falls.  Skin:       Still has dry scaly nipple  Neurological: Negative for dizziness, loss of consciousness and weakness.  Endo/Heme/Allergies: Bruises/bleeds easily.  Psychiatric/Behavioral: Negative for depression and memory loss. The patient has insomnia.        Elavil for sleep    Health Maintenance  Topic Date Due  . Hepatitis C Screening  12/21/1945  . TETANUS/TDAP  11/02/2017 (Originally 09/05/2015)  . INFLUENZA VACCINE  04/04/2017  . MAMMOGRAM  02/14/2019  . COLONOSCOPY  04/14/2025  . DEXA SCAN  Completed  . PNA vac Low Risk Adult  Completed    Physical Exam: There were no vitals filed for this visit. There is no height or weight on file to calculate BMI. Physical Exam  Constitutional: She is oriented to person, place, and time. She appears well-developed and well-nourished. No distress.  HENT:  Head: Normocephalic and atraumatic.  Cardiovascular: Normal rate, regular rhythm, normal heart sounds and intact distal pulses.   PVCs  Pulmonary/Chest: Effort normal and breath sounds normal. No respiratory distress.  Abdominal: Bowel sounds are normal.  Musculoskeletal: Normal range of motion. She exhibits tenderness and deformity.  Ulnar deviation of bilateral hands and some swelling  of interphalangeal joints; tender over right SI joint region  Neurological: She is alert and oriented to person, place, and time.  Skin: Skin is warm and dry.  Psychiatric: She has a normal mood and affect.    Labs reviewed: Basic Metabolic Panel:  Recent Labs  04/03/16 06/05/16 10/27/16 0808  NA  --   --  142  K  --   --  3.9  CL  --   --  102  CO2  --   --  31  GLUCOSE  --   --  83  BUN  --   --  24  CREATININE  --   --  0.81  CALCIUM  --   --  9.0  TSH 2.45 5.45 7.02*   Liver Function Tests:  Recent Labs  10/27/16 0808  AST 16  ALT 10  ALKPHOS 39  BILITOT 0.4  PROT 6.4  ALBUMIN 3.9   No results for input(s): LIPASE, AMYLASE in the last 8760 hours. No results for input(s): AMMONIA in the last 8760 hours. CBC:  Recent Labs  10/27/16 0814 01/31/17 02/08/17  WBC 10.2 15.1 14.1  NEUTROABS 4,284  --  10  HGB 13.5 13.4 13.2  HCT 42.4 42 40  MCV 103.9*  --   --   PLT 291 321 308   Assessment/Plan 1. Sciatica of right side associated with disorder of lumbosacral spine -ongoing chronic pain -also follows with rheumatology who per their note gave her 20 hydrocodone in May, but patient says she never got that -is using her mom's lidoderm patches 5% that she didn't need anymore -on tylenol, tramadol for breakthrough pain, also advised to use nsaids by rheum and remains on chronic prednisone 46m daily - Ambulatory epidural steroid injection; Future for worsened right sacral pain with sciatica that previously responded to epidural  2. Scoliosis, unspecified scoliosis type, unspecified spinal region -contributes to chronic pain - Ambulatory epidural steroid injection; Future  3. Rheumatoid arthritis with positive rheumatoid factor, involving unspecified site ([facility] -ongoing, continues on enbrel but still with diffuse pain -seems we need to clarify who is treating her pain conditions to avoid multiple providers prescribing controlled  substances for patient - Ambulatory  epidural steroid injection; Future for her back pain  4. Bandemia - was noted at rheum with labs in June--wbc was 14 with left shift - CBC with Differential/Platelet repeat today due to previous elevation  5.  Burst blood vessel of left eye -eye red since yesterday, has dry eyes and uses systane and restasis for them -advised that if this continues or becomes recurrent, she should consult her ophthalmologist to ensure she does not have any autoimmune disease causing red eye, denies pain or visual changes though  Labs/tests ordered:  Cbc today, Keep fasting labs before oct appt also Next appt:  06/13/2017  Ohanna Gassert L. Torsha Lemus, D.O. [facility] [address], [place] [address] Cell Phone (Mon-Fri 8am-5pm):  [phone] On Call:  [phone] & follow prompts after 5pm & weekends Office Phone:  [phone] Office Fax:  [phone]

## 2017-04-02 NOTE — Telephone Encounter (Signed)
Patient coming in today at 1:30

## 2017-04-05 ENCOUNTER — Encounter: Payer: Self-pay | Admitting: *Deleted

## 2017-04-11 ENCOUNTER — Telehealth: Payer: Self-pay

## 2017-04-11 DIAGNOSIS — M5387 Other specified dorsopathies, lumbosacral region: Secondary | ICD-10-CM

## 2017-04-11 NOTE — Telephone Encounter (Signed)
Patient called to question the status of her steroid injection. Patient states she is in so much pain and thought this would be set up by now.   I advised patient that the order was placed based on last OV notes yet it does not show in the workque or orderables. Order will need to be corrected.   Patient asked that we give this situation immediate attention for she is in so much pain.  Message sent to Dr.Reed and her assistant [facility]) to correct. Please refer to order that Dr.Green previously placed for steroid injection   Patient requested that someone call her when this is corrected. Patient said it is ok to leave a detailed message on voicemail if she is not available

## 2017-04-11 NOTE — Telephone Encounter (Signed)
Order entered again correctly ( I hope)  Left message on pt's home VM that ordered entered, advised her to call if any questions.

## 2017-04-16 ENCOUNTER — Other Ambulatory Visit: Payer: Self-pay | Admitting: *Deleted

## 2017-04-16 DIAGNOSIS — E039 Hypothyroidism, unspecified: Secondary | ICD-10-CM

## 2017-04-16 DIAGNOSIS — E785 Hyperlipidemia, unspecified: Secondary | ICD-10-CM

## 2017-04-16 DIAGNOSIS — I10 Essential (primary) hypertension: Secondary | ICD-10-CM

## 2017-04-16 DIAGNOSIS — N6489 Other specified disorders of breast: Secondary | ICD-10-CM

## 2017-04-22 ENCOUNTER — Other Ambulatory Visit: Payer: Self-pay | Admitting: Internal Medicine

## 2017-04-27 ENCOUNTER — Ambulatory Visit
Admission: RE | Admit: 2017-04-27 | Discharge: 2017-04-27 | Disposition: A | Discharge status: Home / Self Care | Payer: Medicare Other | Source: Ambulatory Visit | Attending: Internal Medicine | Admitting: Internal Medicine

## 2017-04-27 DIAGNOSIS — M5387 Other specified dorsopathies, lumbosacral region: Secondary | ICD-10-CM

## 2017-04-27 MED ORDER — METHYLPREDNISOLONE ACETATE 40 MG/ML INJ SUSP (RADIOLOG
120.0000 mg | Freq: Once | INTRAMUSCULAR | Status: AC
  Administered 2017-04-27: 120 mg via EPIDURAL

## 2017-04-27 MED ORDER — IOPAMIDOL (ISOVUE-M 200) INJECTION 41%
1.0000 mL | Freq: Once | INTRAMUSCULAR | Status: AC
  Administered 2017-04-27: 1 mL via EPIDURAL

## 2017-04-27 NOTE — Discharge Instructions (Signed)

## 2017-05-10 ENCOUNTER — Other Ambulatory Visit: Payer: Self-pay | Admitting: Internal Medicine

## 2017-05-10 DIAGNOSIS — M059 Rheumatoid arthritis with rheumatoid factor, unspecified: Secondary | ICD-10-CM

## 2017-06-13 ENCOUNTER — Other Ambulatory Visit: Payer: Medicare Other

## 2017-06-13 DIAGNOSIS — E785 Hyperlipidemia, unspecified: Secondary | ICD-10-CM

## 2017-06-13 DIAGNOSIS — N6489 Other specified disorders of breast: Secondary | ICD-10-CM

## 2017-06-13 DIAGNOSIS — I10 Essential (primary) hypertension: Secondary | ICD-10-CM

## 2017-06-13 DIAGNOSIS — E039 Hypothyroidism, unspecified: Secondary | ICD-10-CM

## 2017-06-13 LAB — CBC WITH DIFFERENTIAL/PLATELET
Basophils Absolute: 61 cells/uL (ref 0–200)
Basophils Relative: 0.5 %
Eosinophils Absolute: 182 cells/uL (ref 15–500)
Eosinophils Relative: 1.5 %
HCT: 38.6 % (ref 35.0–45.0)
Hemoglobin: 13 g/dL (ref 11.7–15.5)
Lymphs Abs: 3388 cells/uL (ref 850–3900)
MCH: 31 pg (ref 27.0–33.0)
MCHC: 33.7 g/dL (ref 32.0–36.0)
MCV: 92.1 fL (ref 80.0–100.0)
MPV: 10.9 fL (ref 7.5–12.5)
Monocytes Relative: 12.1 %
Neutro Abs: 7006 cells/uL (ref 1500–7800)
Neutrophils Relative %: 57.9 %
Platelets: 306 10*3/uL (ref 140–400)
RBC: 4.19 10*6/uL (ref 3.80–5.10)
RDW: 12.4 % (ref 11.0–15.0)
Total Lymphocyte: 28 %
WBC mixed population: 1464 cells/uL — ABNORMAL HIGH (ref 200–950)
WBC: 12.1 10*3/uL — ABNORMAL HIGH (ref 3.8–10.8)

## 2017-06-13 LAB — COMPREHENSIVE METABOLIC PANEL
AG Ratio: 1.6 (calc) (ref 1.0–2.5)
ALT: 13 U/L (ref 6–29)
AST: 15 U/L (ref 10–35)
Albumin: 3.8 g/dL (ref 3.6–5.1)
Alkaline phosphatase (APISO): 45 U/L (ref 33–130)
BUN/Creatinine Ratio: 37 (calc) — ABNORMAL HIGH (ref 6–22)
BUN: 27 mg/dL — ABNORMAL HIGH (ref 7–25)
CO2: 34 mmol/L — ABNORMAL HIGH (ref 20–32)
Calcium: 9 mg/dL (ref 8.6–10.4)
Chloride: 102 mmol/L (ref 98–110)
Creat: 0.73 mg/dL (ref 0.50–0.99)
Globulin: 2.4 g/dL (calc) (ref 1.9–3.7)
Glucose, Bld: 79 mg/dL (ref 65–99)
Potassium: 4.3 mmol/L (ref 3.5–5.3)
Sodium: 141 mmol/L (ref 135–146)
Total Bilirubin: 0.3 mg/dL (ref 0.2–1.2)
Total Protein: 6.2 g/dL (ref 6.1–8.1)

## 2017-06-13 LAB — LIPID PANEL
Cholesterol: 181 mg/dL (ref <200)
HDL: 84 mg/dL (ref >50)
LDL Cholesterol (Calc): 81 mg/dL (calc)
Non-HDL Cholesterol (Calc): 97 mg/dL (calc) (ref <130)
Total CHOL/HDL Ratio: 2.2 (calc) (ref <5.0)
Triglycerides: 81 mg/dL (ref <150)

## 2017-06-13 LAB — TSH: TSH: 6.99 mIU/L — ABNORMAL HIGH (ref 0.40–4.50)

## 2017-06-18 ENCOUNTER — Ambulatory Visit: Payer: Medicare Other | Admitting: Internal Medicine

## 2017-06-19 ENCOUNTER — Encounter: Payer: Self-pay | Admitting: Internal Medicine

## 2017-06-22 ENCOUNTER — Ambulatory Visit: Payer: Medicare Other | Admitting: Nurse Practitioner

## 2017-06-23 ENCOUNTER — Other Ambulatory Visit: Payer: Self-pay | Admitting: Internal Medicine

## 2017-06-23 DIAGNOSIS — M059 Rheumatoid arthritis with rheumatoid factor, unspecified: Secondary | ICD-10-CM

## 2017-06-25 ENCOUNTER — Other Ambulatory Visit: Payer: Self-pay | Admitting: Internal Medicine

## 2017-06-25 ENCOUNTER — Encounter: Payer: Self-pay | Admitting: Internal Medicine

## 2017-06-25 ENCOUNTER — Ambulatory Visit (INDEPENDENT_AMBULATORY_CARE_PROVIDER_SITE_OTHER): Payer: Medicare Other | Admitting: Internal Medicine

## 2017-06-25 VITALS — BP 158/80 | HR 75 | Temp 98.3°F | Wt 108.0 lb

## 2017-06-25 DIAGNOSIS — E038 Other specified hypothyroidism: Secondary | ICD-10-CM

## 2017-06-25 DIAGNOSIS — M5386 Other specified dorsopathies, lumbar region: Secondary | ICD-10-CM

## 2017-06-25 DIAGNOSIS — M810 Age-related osteoporosis without current pathological fracture: Secondary | ICD-10-CM | POA: Diagnosis not present

## 2017-06-25 DIAGNOSIS — M059 Rheumatoid arthritis with rheumatoid factor, unspecified: Secondary | ICD-10-CM | POA: Diagnosis not present

## 2017-06-25 DIAGNOSIS — I10 Essential (primary) hypertension: Secondary | ICD-10-CM | POA: Diagnosis not present

## 2017-06-25 DIAGNOSIS — M5387 Other specified dorsopathies, lumbosacral region: Secondary | ICD-10-CM | POA: Diagnosis not present

## 2017-06-25 DIAGNOSIS — L659 Nonscarring hair loss, unspecified: Secondary | ICD-10-CM

## 2017-06-25 DIAGNOSIS — D692 Other nonthrombocytopenic purpura: Secondary | ICD-10-CM

## 2017-06-25 DIAGNOSIS — Z23 Encounter for immunization: Secondary | ICD-10-CM

## 2017-06-25 DIAGNOSIS — R319 Hematuria, unspecified: Secondary | ICD-10-CM

## 2017-06-25 DIAGNOSIS — L853 Xerosis cutis: Secondary | ICD-10-CM | POA: Diagnosis not present

## 2017-06-25 LAB — POCT URINALYSIS DIPSTICK
Bilirubin, UA: NEGATIVE
Blood, UA: NEGATIVE
Glucose, UA: NEGATIVE
Ketones, UA: NEGATIVE
Leukocytes, UA: NEGATIVE
Nitrite, UA: NEGATIVE
Protein, UA: NEGATIVE
Spec Grav, UA: 1.025 (ref 1.010–1.025)
Urobilinogen, UA: NEGATIVE E.U./dL — AB
pH, UA: 6.5 (ref 5.0–8.0)

## 2017-06-25 MED ORDER — FINASTERIDE 5 MG PO TABS: 2.5000 mg | ORAL_TABLET | Freq: Every day | ORAL | 3 refills | Status: DC

## 2017-06-25 MED ORDER — BIOTIN 1000 MCG PO TABS: 1000.0000 ug | ORAL_TABLET | Freq: Two times a day (BID) | ORAL | 3 refills | Status: AC

## 2017-06-25 MED ORDER — TRIAMCINOLONE ACETONIDE 0.1 % EX OINT: 1.0000 "application " | TOPICAL_OINTMENT | Freq: Two times a day (BID) | CUTANEOUS | 1 refills | Status: DC

## 2017-06-25 NOTE — Progress Notes (Signed)
Location:  [facility] clinic Provider:  Dianely Krehbiel L. Mariea Clonts, D.O., C.M.D.  Code Status: full code Goals of Care:  Advanced Directives 04/02/2017  Does Patient Have a Medical Advance Directive? No  Would patient like information on creating a medical advance directive? No - Patient declined   Chief Complaint  Patient presents with  . Medical Management of Chronic Issues    discuss labs  . Acute Visit    blood in urine    HPI: Patient is a 71 y.o. female seen today for medical management of chronic diseases and acute concern for blood in urine.  Dipstick was negative.  Noticed the blood when she wiped.    Not painful with urination.  No blood noted in stool. No changes in color of stool.  She does love water, but did not have any so far--had OJ and chocolate milk.    BP elevated today.  Says the tightness of the cuff hurts her arm.    Back is killing her.  Was told she could return sooner for an injection again.  She is asking about a nerve stimulator.  Uses her tramadol which helps.  TENS unit suggestion by a friend.  Last injection was 04/27/17.    C/o thinning hair.  Is taking biotin and has two different supplements for this.  May also be due to frequent dying of her hair over years.  She was put on low dose finasteride by her dermatologist.    Also started on low dose triamcinolone for dry skin over her upper lip.  Reinforced this is only to be used for a short time (like one week), then stopped until recurs.    Hypothyroidism: She is expected to need levothyroxine after her RAI, but is not currently on this.  Says she had diarrhea with it before.  She sees Dr. Elyse Hsu again in January. Says he did not feel like her hair thinning was due to this and did not want to start the levothyroxine when he saw her.  True that levels are less than 10 and normally we don't treat unless overtly symptomatic.  Osteoporosis:  Is to get prolia from Dr. Madaline Guthrie, PA rheum.  Takes a multivitamin but  we do not have down that she takes any additional ca with D and D.   Remains on prednisone for her RA.  Also on enbrel.  Uses tramadol only for severe pain, usually uses tylenol.  She is not at risk for hepatitis C.  Reviewed risk factors with her and she does not have them.  Past Medical History:  Diagnosis Date  . Alopecia areata 02/16/2006  . Anxiety state, unspecified 2004  . Benign paroxysmal positional vertigo 04/01/03  . Cervicalgia 04/01/03  . Chronic rhinitis 09/01/05  . Corns and callosities   . Dermatophytosis of nail   . Diarrhea 04/15/2015  . Diverticulosis   . Dysphagia, unspecified(787.20) 2004  . Enthesopathy of ankle and tarsus, unspecified 03/31/96  . External hemorrhoids without mention of complication   . 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 Fibromyalgia   . Hypertension 2004  . Hypothyroidism   . Impacted cerumen    right ear  . Lumbago   . Myalgia and myositis, unspecified 03/01/05  . Nontoxic uninodular goiter   . Osteoarthrosis, unspecified whether generalized or localized, unspecified site 04/01/03  . Other left bundle branch block   . Rheumatoid arthritis(714.0) 10/05/2004   lumbar spondylosis. Lumbo-sacral anterolisthesis    . Senile osteoporosis 04/01/03  . Sprain of  metatarsophalangeal (joint) of foot   . Thyrotoxicosis   . Thyrotoxicosis without mention of goiter or other cause, without mention of thyrotoxic crisis or storm   . Unspecified vitamin D deficiency   . Urinary frequency     Past Surgical History:  Procedure Laterality Date  . COLONOSCOPY    . COLONOSCOPY N/A 04/15/2015   Procedure: COLONOSCOPY;  Surgeon: Gatha Mayer, MD;  Location: [facility];  Service: Endoscopy;  Laterality: N/A;  . FOOT SURGERY Right 05/22/2014   Dr.Strom   . [place]   DR [place]   . TONSILLECTOMY AND ADENOIDECTOMY  1974  . TUBAL LIGATION  1973    Allergies  Allergen Reactions  . Arava  [Leflunomide] Diarrhea, Nausea Only and Other (See Comments)    Loss appetite  . Methotrexate Derivatives Other (See Comments)    Hair loss  . Neurontin [Gabapentin] Nausea And Vomiting  . Penicillins Rash    Outpatient Encounter Prescriptions as of 06/25/2017  Medication Sig  . acetaminophen (TYLENOL) 500 MG tablet Take 500 mg by mouth every 6 (six) hours as needed for pain. Take two tablets up to four times a day as needed  . amitriptyline (ELAVIL) 10 MG tablet TAKE 1 TABLET BY MOUTH AT BEDTIME TO  HELP  RELAX  MUSCLES  . ENBREL SURECLICK 50 MG/ML injection Inject 50 mg as directed once a week. Inject once weekly for arthritis  . fluticasone (FLONASE) 50 MCG/ACT nasal spray Place 1 spray into the nose daily.  . folic acid (FOLVITE) 1 MG tablet Take one tablet every morning.  . Glucosamine-Chondroit-Vit C-Mn (GLUCOSAMINE CHONDR 500 COMPLEX PO) Take 500 mg by mouth daily. Take 2 tablets daily for joint health.  . hydrochlorothiazide (MICROZIDE) 12.5 MG capsule TAKE ONE CAPSULE BY MOUTH ONCE DAILY FOR BLOOD PRESSURE  . Multiple Vitamin (MULTIVITAMIN) tablet Take 1 tablet by mouth daily. Take 1 tablet once a daily  . Omega-3 Fatty Acids (FISH OIL) 1000 MG CAPS Take 1,000 mg by mouth daily. Take one tablet once a daily for heart, joint and skin health.  . predniSONE (DELTASONE) 5 MG tablet Take 5 mg by mouth daily with breakfast.   . traMADol (ULTRAM) 50 MG tablet TAKE 1 TO 2 TABLETS BY MOUTH UP TO 4 TIMES DAILY AS NEEDED FOR PAIN  . urea (CARMOL) 20 % cream Apply to callus daily  . [DISCONTINUED] raloxifene (EVISTA) 60 MG tablet TAKE ONE TABLET BY MOUTH ONCE DAILY TO  TREAT  OSTEOPOROSIS.   No facility-administered encounter medications on file as of 06/25/2017.     Review of Systems:  Review of Systems  Constitutional: Negative for chills, fever and malaise/fatigue.  HENT: Negative for congestion.   Eyes: Negative for blurred vision.  Respiratory: Negative for cough and shortness of  breath.   Cardiovascular: Negative for chest pain, palpitations and leg swelling.  Gastrointestinal: Negative for abdominal pain, blood in stool, constipation and melena.  Genitourinary: Negative for dysuria.  Musculoskeletal: Positive for back pain and joint pain. Negative for falls.  Skin:       Dry skin upper lip, bruising on arms, thinning of hair  Neurological: Negative for dizziness, loss of consciousness and weakness.  Psychiatric/Behavioral: Negative for depression and memory loss. The patient does not have insomnia.     Health Maintenance  Topic Date Due  . Hepatitis C Screening  28-Mar-1946  . INFLUENZA VACCINE  04/04/2017  .  TETANUS/TDAP  11/02/2017 (Originally 09/05/2015)  . MAMMOGRAM  02/14/2019  . COLONOSCOPY  04/14/2025  . DEXA SCAN  Completed  . PNA vac Low Risk Adult  Completed    Physical Exam: Vitals:   06/25/17 0938  BP: (!) 158/80  Pulse: 75  Temp: 98.3 F (36.8 C)  TempSrc: Oral  SpO2: 97%  Weight: 108 lb (49 kg)   Body mass index is 23.37 kg/m. Physical Exam  Constitutional: She is oriented to person, place, and time. No distress.  Frail appearing lady  HENT:  Head: Normocephalic and atraumatic.  Eyes: Pupils are equal, round, and reactive to light.  Cardiovascular: Normal rate, regular rhythm, normal heart sounds and intact distal pulses.   Pulmonary/Chest: Effort normal and breath sounds normal.  Abdominal: Soft. Bowel sounds are normal.  Musculoskeletal: Normal range of motion.  Walks with limp  Neurological: She is alert and oriented to person, place, and time. No cranial nerve deficit.  Skin: Capillary refill takes less than 2 seconds.  Ecchymoses of arms with thin skin, erythema and scaliness over upper lip, thinning hair  Psychiatric: She has a normal mood and affect.    Labs reviewed: Basic Metabolic Panel:  Recent Labs  10/27/16 0808 06/13/17 0828  NA 142 141  K 3.9 4.3  CL 102 102  CO2 31 34*  GLUCOSE 83 79  BUN 24 27*    CREATININE 0.81 0.73  CALCIUM 9.0 9.0  TSH 7.02* 6.99*   Liver Function Tests:  Recent Labs  10/27/16 0808 06/13/17 0828  AST 16 15  ALT 10 13  ALKPHOS 39  --   BILITOT 0.4 0.3  PROT 6.4 6.2  ALBUMIN 3.9  --    No results for input(s): LIPASE, AMYLASE in the last 8760 hours. No results for input(s): AMMONIA in the last 8760 hours. CBC:  Recent Labs  10/27/16 0814  02/08/17 04/02/17 1417 06/13/17 0828  WBC 10.2  < > 14.1 10.0 12.1*  NEUTROABS 4,284  --  10 7,300 7,006  HGB 13.5  < > 13.2 12.3 13.0  HCT 42.4  < > 40 37.9 38.6  MCV 103.9*  --   --  97.9 92.1  PLT 291  < > 308 328 306  < > = values in this interval not displayed. Lipid Panel:  Recent Labs  06/13/17 0828  CHOL 181  HDL 84  TRIG 81  CHOLHDL 2.2   Assessment/Plan 1. Hematuria, unspecified type - POC Urinalysis Dipstick was negative for blood -no signs of UTI -she is to call back if her blood when she wipes recurs  2. Other specified hypothyroidism -cont to monitor -leave mgt to Dr. Elyse Hsu  3. Sciatica of right side associated with disorder of lumbosacral spine - requests another referral for steroid injection which did initially offer her relief when last done - Ambulatory referral to Physical Medicine Rehab to discuss spinal stimulator option--pt wants to avoid "big surgeries" on her back (also has osteoporosis so poor candidate for lumbar surgery) - Ambulatory referral to Interventional Radiology for steroid injection--is aware about further detriment to bones, but pain significant  4. Rheumatoid arthritis with positive rheumatoid factor, involving unspecified site (HCC) - cont enbrel and prednisone per rheumatology  5. Essential hypertension -bp typically at goal, but pain increased today at appt  6. Dry skin - on upper lip--cont mgt as per derm--I sent in her ointment - triamcinolone ointment (KENALOG) 0.1 %; Apply 1 application topically 2 (two) times daily. For 5-7 days  Dispense:  30 g; Refill: 1  7. Need for influenza vaccination - Flu vaccine HIGH DOSE PF (Fluzone High dose)  8. Senile osteoporosis -for prolia with rheum, also needs ca with D and additional D therapy with this  9. Thinning hair -could be hypothyroid related, but she also has been using a lot of dye and products on her hair for years  10.   Senile purpura -mix on steroid-induced and thinning skin with age, loss of collagen   Labs/tests ordered:   Orders Placed This Encounter  Procedures  . Flu vaccine HIGH DOSE PF (Fluzone High dose)  . Ambulatory referral to Physical Medicine Rehab    Referral Priority:   Routine    Referral Type:   Rehabilitation    Referral Reason:   Specialty Services Required    Requested Specialty:   Physical Medicine and Rehabilitation    Number of Visits Requested:   1  . Ambulatory referral to Interventional Radiology    Referral Priority:   Routine    Referral Type:   Consultation    Referral Reason:   Specialty Services Required    Requested Specialty:   Interventional Radiology    Number of Visits Requested:   1  . POC Urinalysis Dipstick   Next appt:  10/01/2017 med mgt  Eldean Klatt L. Kjell Brannen, D.O. [facility] [address], [place] [address] Cell Phone (Mon-Fri 8am-5pm):  [phone] On Call:  [phone] & follow prompts after 5pm & weekends Office Phone:  [phone] Office Fax:  [phone]  CC:  Dr. Legrand Como [place]

## 2017-06-26 DIAGNOSIS — D692 Other nonthrombocytopenic purpura: Secondary | ICD-10-CM | POA: Insufficient documentation

## 2017-06-26 DIAGNOSIS — M810 Age-related osteoporosis without current pathological fracture: Secondary | ICD-10-CM | POA: Insufficient documentation

## 2017-07-06 ENCOUNTER — Other Ambulatory Visit: Payer: Self-pay | Admitting: Internal Medicine

## 2017-07-06 DIAGNOSIS — M059 Rheumatoid arthritis with rheumatoid factor, unspecified: Secondary | ICD-10-CM

## 2017-07-09 NOTE — Telephone Encounter (Signed)
A medication refill request was received from pharmacy electronically. Rx was called in to pharmacy after checking prescription fill date on PMP AWARE.

## 2017-07-10 ENCOUNTER — Other Ambulatory Visit: Payer: Self-pay | Admitting: Internal Medicine

## 2017-07-10 DIAGNOSIS — M059 Rheumatoid arthritis with rheumatoid factor, unspecified: Secondary | ICD-10-CM

## 2017-07-13 ENCOUNTER — Ambulatory Visit
Admission: RE | Admit: 2017-07-13 | Discharge: 2017-07-13 | Disposition: A | Discharge status: Home / Self Care | Payer: Medicare Other | Source: Ambulatory Visit | Attending: Internal Medicine | Admitting: Internal Medicine

## 2017-07-13 DIAGNOSIS — M5386 Other specified dorsopathies, lumbar region: Secondary | ICD-10-CM

## 2017-07-13 MED ORDER — METHYLPREDNISOLONE ACETATE 40 MG/ML INJ SUSP (RADIOLOG
120.0000 mg | Freq: Once | INTRAMUSCULAR | Status: AC
  Administered 2017-07-13: 120 mg via EPIDURAL

## 2017-07-13 MED ORDER — IOPAMIDOL (ISOVUE-M 200) INJECTION 41%
1.0000 mL | Freq: Once | INTRAMUSCULAR | Status: AC
  Administered 2017-07-13: 1 mL via EPIDURAL

## 2017-07-13 NOTE — Discharge Instructions (Signed)

## 2017-08-02 ENCOUNTER — Ambulatory Visit (INDEPENDENT_AMBULATORY_CARE_PROVIDER_SITE_OTHER): Payer: Medicare Other | Admitting: Nurse Practitioner

## 2017-08-02 ENCOUNTER — Encounter: Payer: Self-pay | Admitting: Nurse Practitioner

## 2017-08-02 VITALS — BP 142/78 | HR 82 | Temp 98.3°F | Resp 17 | Ht <= 58 in | Wt 106.0 lb

## 2017-08-02 DIAGNOSIS — I10 Essential (primary) hypertension: Secondary | ICD-10-CM | POA: Diagnosis not present

## 2017-08-02 DIAGNOSIS — R0789 Other chest pain: Secondary | ICD-10-CM | POA: Diagnosis not present

## 2017-08-02 MED ORDER — LOSARTAN POTASSIUM 25 MG PO TABS: 25.0000 mg | ORAL_TABLET | Freq: Every day | ORAL | 1 refills | Status: DC

## 2017-08-02 MED ORDER — LOSARTAN POTASSIUM 25 MG PO TABS: 50.0000 mg | ORAL_TABLET | Freq: Every day | ORAL | 1 refills | Status: DC

## 2017-08-02 NOTE — Patient Instructions (Addendum)
Start losartan 25 mg daily Cont hydrochlorothiazide daily     DASH Eating Plan DASH stands for "Dietary Approaches to Stop Hypertension." The DASH eating plan is a healthy eating plan that has been shown to reduce high blood pressure (hypertension). It may also reduce your risk for type 2 diabetes, heart disease, and stroke. The DASH eating plan may also help with weight loss. What are tips for following this plan? General guidelines  Avoid eating more than 2,300 mg (milligrams) of salt (sodium) a day. If you have hypertension, you may need to reduce your sodium intake to 1,500 mg a day.  Limit alcohol intake to no more than 1 drink a day for nonpregnant women and 2 drinks a day for men. One drink equals 12 oz of beer, 5 oz of wine, or 1 oz of hard liquor.  Work with your health care provider to maintain a healthy body weight or to lose weight. Ask what an ideal weight is for you.  Get at least 30 minutes of exercise that causes your heart to beat faster (aerobic exercise) most days of the week. Activities may include walking, swimming, or biking.  Work with your health care provider or diet and nutrition specialist (dietitian) to adjust your eating plan to your individual calorie needs. Reading food labels  Check food labels for the amount of sodium per serving. Choose foods with less than 5 percent of the Daily Value of sodium. Generally, foods with less than 300 mg of sodium per serving fit into this eating plan.  To find whole grains, look for the word "whole" as the first word in the ingredient list. Shopping  Buy products labeled as "low-sodium" or "no salt added."  Buy fresh foods. Avoid canned foods and premade or frozen meals. Cooking  Avoid adding salt when cooking. Use salt-free seasonings or herbs instead of table salt or sea salt. Check with your health care provider or pharmacist before using salt substitutes.  Do not fry foods. Cook foods using healthy methods such as  baking, boiling, grilling, and broiling instead.  Cook with heart-healthy oils, such as olive, canola, soybean, or sunflower oil. Meal planning   Eat a balanced diet that includes: ? 5 or more servings of fruits and vegetables each day. At each meal, try to fill half of your plate with fruits and vegetables. ? Up to 6-8 servings of whole grains each day. ? Less than 6 oz of lean meat, poultry, or fish each day. A 3-oz serving of meat is about the same size as a deck of cards. One egg equals 1 oz. ? 2 servings of low-fat dairy each day. ? A serving of nuts, seeds, or beans 5 times each week. ? Heart-healthy fats. Healthy fats called Omega-3 fatty acids are found in foods such as flaxseeds and coldwater fish, like sardines, salmon, and mackerel.  Limit how much you eat of the following: ? Canned or prepackaged foods. ? Food that is high in trans fat, such as fried foods. ? Food that is high in saturated fat, such as fatty meat. ? Sweets, desserts, sugary drinks, and other foods with added sugar. ? Full-fat dairy products.  Do not salt foods before eating.  Try to eat at least 2 vegetarian meals each week.  Eat more home-cooked food and less restaurant, buffet, and fast food.  When eating at a restaurant, ask that your food be prepared with less salt or no salt, if possible. What foods are recommended? The items listed  may not be a complete list. Talk with your dietitian about what dietary choices are best for you. Grains Whole-grain or whole-wheat bread. Whole-grain or whole-wheat pasta. Brown rice. [place]. Bulgur. Whole-grain and low-sodium cereals. Pita bread. Low-fat, low-sodium crackers. Whole-wheat flour tortillas. Vegetables Fresh or frozen vegetables (raw, steamed, roasted, or grilled). Low-sodium or reduced-sodium tomato and vegetable juice. Low-sodium or reduced-sodium tomato sauce and tomato paste. Low-sodium or reduced-sodium canned vegetables. Fruits All fresh,  dried, or frozen fruit. Canned fruit in natural juice (without added sugar). Meat and other protein foods Skinless chicken or [place]. Ground chicken or [place]. Pork with fat trimmed off. Fish and seafood. Egg whites. Dried beans, peas, or lentils. Unsalted nuts, nut butters, and seeds. Unsalted canned beans. Lean cuts of beef with fat trimmed off. Low-sodium, lean deli meat. Dairy Low-fat (1%) or fat-free (skim) milk. Fat-free, low-fat, or reduced-fat cheeses. Nonfat, low-sodium ricotta or cottage cheese. Low-fat or nonfat yogurt. Low-fat, low-sodium cheese. Fats and oils Soft margarine without trans fats. Vegetable oil. Low-fat, reduced-fat, or light mayonnaise and salad dressings (reduced-sodium). Canola, safflower, olive, soybean, and sunflower oils. Avocado. Seasoning and other foods Herbs. Spices. Seasoning mixes without salt. Unsalted popcorn and pretzels. Fat-free sweets. What foods are not recommended? The items listed may not be a complete list. Talk with your dietitian about what dietary choices are best for you. Grains Baked goods made with fat, such as croissants, muffins, or some breads. Dry pasta or rice meal packs. Vegetables Creamed or fried vegetables. Vegetables in a cheese sauce. Regular canned vegetables (not low-sodium or reduced-sodium). Regular canned tomato sauce and paste (not low-sodium or reduced-sodium). Regular tomato and vegetable juice (not low-sodium or reduced-sodium). Angie Fava. Olives. Fruits Canned fruit in a light or heavy syrup. Fried fruit. Fruit in cream or butter sauce. Meat and other protein foods Fatty cuts of meat. Ribs. Fried meat. Berniece Salines. Sausage. Bologna and other processed lunch meats. Salami. Fatback. Hotdogs. Bratwurst. Salted nuts and seeds. Canned beans with added salt. Canned or smoked fish. Whole eggs or egg yolks. Chicken or [place] with skin. Dairy Whole or 2% milk, cream, and half-and-half. Whole or full-fat cream cheese. Whole-fat or sweetened  yogurt. Full-fat cheese. Nondairy creamers. Whipped toppings. Processed cheese and cheese spreads. Fats and oils Butter. Stick margarine. Lard. Shortening. Ghee. Bacon fat. Tropical oils, such as coconut, palm kernel, or palm oil. Seasoning and other foods Salted popcorn and pretzels. Onion salt, garlic salt, seasoned salt, table salt, and sea salt. Worcestershire sauce. Tartar sauce. Barbecue sauce. Teriyaki sauce. Soy sauce, including reduced-sodium. Steak sauce. Canned and packaged gravies. Fish sauce. Oyster sauce. Cocktail sauce. Horseradish that you find on the shelf. Ketchup. Mustard. Meat flavorings and tenderizers. Bouillon cubes. Hot sauce and Tabasco sauce. Premade or packaged marinades. Premade or packaged taco seasonings. Relishes. Regular salad dressings. Where to find more information:  National Heart, Lung, and [place]: https://wilson-eaton.com/  American Heart Association: www.heart.org Summary  The DASH eating plan is a healthy eating plan that has been shown to reduce high blood pressure (hypertension). It may also reduce your risk for type 2 diabetes, heart disease, and stroke.  With the DASH eating plan, you should limit salt (sodium) intake to 2,300 mg a day. If you have hypertension, you may need to reduce your sodium intake to 1,500 mg a day.  When on the DASH eating plan, aim to eat more fresh fruits and vegetables, whole grains, lean proteins, low-fat dairy, and heart-healthy fats.  Work with your health care provider or diet  and nutrition specialist (dietitian) to adjust your eating plan to your individual calorie needs. This information is not intended to replace advice given to you by your health care provider. Make sure you discuss any questions you have with your health care provider. Document Released: 08/10/2011 Document Revised: 08/14/2016 Document Reviewed: 08/14/2016 Elsevier Interactive Patient Education  2017 [employer].

## 2017-08-02 NOTE — Progress Notes (Signed)
Careteam: Patient Care Team: Gayland Curry, DO as PCP - General (Geriatric Medicine) Hennie Duos, MD as Consulting Physician (Rheumatology) Altheimer, Legrand Como, MD as Consulting Physician (Endocrinology) Newman Pies, MD as Consulting Physician (Neurosurgery) Clydell Hakim, MD as Consulting Physician (Anesthesiology) Rolla Flatten, MD as Consulting Physician (Diagnostic Radiology) Jolene Schimke, MD as Referring Physician (Dermatology) Lynnell Dike, OD as Consulting Physician (Optometry)  Advanced Directive information Does Patient Have a Medical Advance Directive?: No  Allergies  Allergen Reactions  . Arava [Leflunomide] Diarrhea, Nausea Only and Other (See Comments)    Loss appetite  . Methotrexate Derivatives Other (See Comments)    Hair loss  . Neurontin [Gabapentin] Nausea And Vomiting  . Penicillins Rash    Chief Complaint  Patient presents with  . Acute Visit    Pt is being seen for elevation in blood pressure off and on for several weeks.      HPI: Patient is a 71 y.o. female seen in the office today due to elevated blood pressure.  When she went to another appt her blood pressure was 160-180/? Occasionally will eat fries at [place], does not feel like she eats a lot of food high in sodium.  Feeling some nausea and jittery.  No blurred vision, no dizziness, no headache.  Occasional chest tightness 1-2 times a month, last a few minutes.  No shortness of breath with chest tightness  Review of Systems:  Review of Systems  Constitutional: Negative for chills, fever and malaise/fatigue.  Eyes: Negative for blurred vision.  Respiratory: Positive for shortness of breath (occasional). Negative for cough.   Cardiovascular: Negative for chest pain, palpitations and leg swelling.  Neurological: Negative for dizziness, loss of consciousness, weakness and headaches.    Past Medical History:  Diagnosis Date  . Alopecia areata 02/16/2006  . Anxiety  state, unspecified 2004  . Benign paroxysmal positional vertigo 04/01/03  . Cervicalgia 04/01/03  . Chronic rhinitis 09/01/05  . Corns and callosities   . Dermatophytosis of nail   . Diarrhea 04/15/2015  . Diverticulosis   . Dysphagia, unspecified(787.20) 2004  . Enthesopathy of ankle and tarsus, unspecified 03/31/96  . External hemorrhoids without mention of complication   . 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 Fibromyalgia   . Hypertension 2004  . Hypothyroidism   . Impacted cerumen    right ear  . Lumbago   . Myalgia and myositis, unspecified 03/01/05  . Nontoxic uninodular goiter   . Osteoarthrosis, unspecified whether generalized or localized, unspecified site 04/01/03  . Other left bundle branch block   . Rheumatoid arthritis(714.0) 10/05/2004   lumbar spondylosis. Lumbo-sacral anterolisthesis    . Senile osteoporosis 04/01/03  . Sprain of metatarsophalangeal (joint) of foot   . Thyrotoxicosis   . Thyrotoxicosis without mention of goiter or other cause, without mention of thyrotoxic crisis or storm   . Unspecified vitamin D deficiency   . Urinary frequency    Past Surgical History:  Procedure Laterality Date  . COLONOSCOPY    . COLONOSCOPY N/A 04/15/2015   Procedure: COLONOSCOPY;  Surgeon: Gatha Mayer, MD;  Location: [facility];  Service: Endoscopy;  Laterality: N/A;  . FOOT SURGERY Right 05/22/2014   Dr.Strom   . [place]   DR Foley   . TONSILLECTOMY AND ADENOIDECTOMY  1974  . TUBAL LIGATION  1973   Social History:   reports that  has never  smoked. she has never used smokeless tobacco. She reports that she does not drink alcohol or use drugs.  Family History  Problem Relation Age of Onset  . Heart attack Father        age 1  . Heart disease Father     Medications:   Medication List        Accurate as of 08/02/17 10:36 AM. Always use your most recent med list.            amitriptyline 10 MG tablet Commonly known as:  ELAVIL TAKE 1 TABLET BY MOUTH AT BEDTIME TO  HELP  RELAX  MUSCLES   Biotin 1000 MCG tablet Take 1 tablet (1 mg total) by mouth 2 (two) times daily.   ENBREL SURECLICK 50 MG/ML injection Generic drug:  etanercept   finasteride 5 MG tablet Commonly known as:  PROSCAR Take 0.5 tablets (2.5 mg total) by mouth daily.   Fish Oil 1000 MG Caps   fluticasone 50 MCG/ACT nasal spray Commonly known as:  FLONASE Place 1 spray into the nose daily.   folic acid 1 MG tablet Commonly known as:  FOLVITE Take one tablet every morning.   GLUCOSAMINE CHONDR 500 COMPLEX PO   hydrochlorothiazide 12.5 MG capsule Commonly known as:  MICROZIDE TAKE ONE CAPSULE BY MOUTH ONCE DAILY FOR BLOOD PRESSURE   multivitamin tablet   predniSONE 5 MG tablet Commonly known as:  DELTASONE Take 1 tablet (5 mg total) by mouth daily with breakfast.   traMADol 50 MG tablet Commonly known as:  ULTRAM TAKE 1-2 TABLETS BY MOUTH UP TO 4 TIMES DAILY AS NEEDED FOR PAIN.   triamcinolone ointment 0.1 % Commonly known as:  KENALOG Apply 1 application topically 2 (two) times daily. For 5-7 days   TYLENOL 500 MG tablet Generic drug:  acetaminophen   urea 20 % cream Commonly known as:  CARMOL Apply to callus daily        Physical Exam:  Vitals:   08/02/17 1023  BP: (!) 142/78  Pulse: 82  Resp: 17  Temp: 98.3 F (36.8 C)  TempSrc: Oral  SpO2: 97%  Weight: 106 lb (48.1 kg)  Height: 4\' 9"  (1.448 m)   Body mass index is 22.94 kg/m.  Physical Exam  Constitutional: She is oriented to person, place, and time. No distress.  Frail appearing lady  HENT:  Head: Normocephalic and atraumatic.  Eyes: Pupils are equal, round, and reactive to light.  Cardiovascular: Normal rate, regular rhythm and normal heart sounds.  Pulmonary/Chest: Effort normal and breath sounds normal.  Neurological: She is alert and oriented to person, place, and time. No cranial nerve  deficit.  Skin:  thinning hair  Psychiatric: She has a normal mood and affect.    Labs reviewed: Basic Metabolic Panel: Recent Labs    10/27/16 0808 06/13/17 0828  NA 142 141  K 3.9 4.3  CL 102 102  CO2 31 34*  GLUCOSE 83 79  BUN 24 27*  CREATININE 0.81 0.73  CALCIUM 9.0 9.0  TSH 7.02* 6.99*   Liver Function Tests: Recent Labs    10/27/16 0808 06/13/17 0828  AST 16 15  ALT 10 13  ALKPHOS 39  --   BILITOT 0.4 0.3  PROT 6.4 6.2  ALBUMIN 3.9  --    No results for input(s): LIPASE, AMYLASE in the last 8760 hours. No results for input(s): AMMONIA in the last 8760 hours. CBC: Recent Labs    10/27/16 0814  02/08/17 04/02/17 1417 06/13/17  0828  WBC 10.2   < > 14.1 10.0 12.1*  NEUTROABS 4,284  --  10 7,300 7,006  HGB 13.5   < > 13.2 12.3 13.0  HCT 42.4   < > 40 37.9 38.6  MCV 103.9*  --   --  97.9 92.1  PLT 291   < > 308 328 306   < > = values in this interval not displayed.   Lipid Panel: Recent Labs    06/13/17 0828  CHOL 181  HDL 84  TRIG 81  CHOLHDL 2.2   TSH: Recent Labs    10/27/16 0808 06/13/17 0828  TSH 7.02* 6.99*   A1C: No results found for: HGBA1C   Assessment/Plan 1. Essential hypertension -not at goal; dash diet given - losartan (COZAAR) 25 MG tablet; Take 1 tablets (25 mg total) by mouth daily.  Dispense: 30 tablet; Refill: 1 - EKG 12-Lead - slight changes noted from previous 2 years ago. Pt with chest pressure and family hx of heart disease with MI. Also with hx of RA, will refer to cardiology at this time for further work up and stress test.   2. Chest pressure -occasional chest pressure, with family hx of MI and RA - Ambulatory referral to Cardiology   Next appt: 2-4 weeks with blood pressure readings.  Carlos American. Harle Battiest  [facility] [phone] 8 am - 5 pm) [phone] (after hours)

## 2017-08-06 ENCOUNTER — Other Ambulatory Visit: Payer: Self-pay | Admitting: Internal Medicine

## 2017-08-06 DIAGNOSIS — M059 Rheumatoid arthritis with rheumatoid factor, unspecified: Secondary | ICD-10-CM

## 2017-08-06 NOTE — Telephone Encounter (Signed)
Approve all.

## 2017-08-06 NOTE — Telephone Encounter (Signed)
Ok to fill? Last filled 07/09/17

## 2017-08-20 ENCOUNTER — Other Ambulatory Visit: Payer: Self-pay | Admitting: *Deleted

## 2017-08-20 ENCOUNTER — Encounter: Payer: Self-pay | Admitting: Nurse Practitioner

## 2017-08-20 ENCOUNTER — Ambulatory Visit (INDEPENDENT_AMBULATORY_CARE_PROVIDER_SITE_OTHER): Payer: Medicare Other | Admitting: Nurse Practitioner

## 2017-08-20 VITALS — BP 142/74 | HR 78 | Temp 98.1°F | Ht <= 58 in | Wt 111.4 lb

## 2017-08-20 DIAGNOSIS — I10 Essential (primary) hypertension: Secondary | ICD-10-CM

## 2017-08-20 LAB — BASIC METABOLIC PANEL WITH GFR
BUN / CREAT RATIO: 46 (calc) — AB (ref 6–22)
BUN: 31 mg/dL — ABNORMAL HIGH (ref 7–25)
CALCIUM: 9 mg/dL (ref 8.6–10.4)
CHLORIDE: 102 mmol/L (ref 98–110)
CO2: 32 mmol/L (ref 20–32)
Creat: 0.67 mg/dL (ref 0.50–0.99)
GFR, EST AFRICAN AMERICAN: 104 mL/min/{1.73_m2} (ref >=60)
GFR, Est Non African American: 90 mL/min/{1.73_m2} (ref >=60)
Glucose, Bld: 96 mg/dL (ref 65–139)
POTASSIUM: 4.1 mmol/L (ref 3.5–5.3)
Sodium: 140 mmol/L (ref 135–146)

## 2017-08-20 MED ORDER — LOSARTAN POTASSIUM 50 MG PO TABS: 50.0000 mg | ORAL_TABLET | Freq: Every day | ORAL | 1 refills | Status: DC

## 2017-08-20 NOTE — Progress Notes (Signed)
Careteam: Patient Care Team: Gayland Curry, DO as PCP - General (Geriatric Medicine) Hennie Duos, MD as Consulting Physician (Rheumatology) Altheimer, Legrand Como, MD as Consulting Physician (Endocrinology) Newman Pies, MD as Consulting Physician (Neurosurgery) Clydell Hakim, MD as Consulting Physician (Anesthesiology) Rolla Flatten, MD as Consulting Physician (Diagnostic Radiology) Jolene Schimke, MD as Referring Physician (Dermatology) Lynnell Dike, OD as Consulting Physician (Optometry)  Advanced Directive information    Allergies  Allergen Reactions  . Arava [Leflunomide] Diarrhea, Nausea Only and Other (See Comments)    Loss appetite  . Methotrexate Derivatives Other (See Comments)    Hair loss  . Neurontin [Gabapentin] Nausea And Vomiting  . Penicillins Rash    Chief Complaint  Patient presents with  . Follow-up    2-4 week BP check, Patient stated that she did take BP meds this am.     HPI: Patient is a 71 y.o. female seen in the office today for follow up blood pressure.  At last visit she was started on losartan 25 mg daily with HCTZ  Taking blood pressure at home- ranging from 133-155/72-84 Has not changed diet, eating chick-fil-a twice weekly.  No headaches, no chest pressure or pain.  Has not heard from cardiologist- referral placed to [facility] cardiologist office.    Review of Systems:  Review of Systems  Constitutional: Negative for chills, fever and malaise/fatigue.  Eyes: Negative for blurred vision.  Respiratory: Negative for cough and shortness of breath.   Cardiovascular: Negative for chest pain, palpitations and leg swelling.  Neurological: Negative for dizziness, loss of consciousness, weakness and headaches.    Past Medical History:  Diagnosis Date  . Alopecia areata 02/16/2006  . Anxiety state, unspecified 2004  . Benign paroxysmal positional vertigo 04/01/03  . Cervicalgia 04/01/03  . Chronic rhinitis 09/01/05  . Corns and  callosities   . Dermatophytosis of nail   . Diarrhea 04/15/2015  . Diverticulosis   . Dysphagia, unspecified(787.20) 2004  . Enthesopathy of ankle and tarsus, unspecified 03/31/96  . External hemorrhoids without mention of complication   . 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 Fibromyalgia   . Hypertension 2004  . Hypothyroidism   . Impacted cerumen    right ear  . Lumbago   . Myalgia and myositis, unspecified 03/01/05  . Nontoxic uninodular goiter   . Osteoarthrosis, unspecified whether generalized or localized, unspecified site 04/01/03  . Other left bundle branch block   . Rheumatoid arthritis(714.0) 10/05/2004   lumbar spondylosis. Lumbo-sacral anterolisthesis    . Senile osteoporosis 04/01/03  . Sprain of metatarsophalangeal (joint) of foot   . Thyrotoxicosis   . Thyrotoxicosis without mention of goiter or other cause, without mention of thyrotoxic crisis or storm   . Unspecified vitamin D deficiency   . Urinary frequency    Past Surgical History:  Procedure Laterality Date  . COLONOSCOPY    . COLONOSCOPY N/A 04/15/2015   Procedure: COLONOSCOPY;  Surgeon: Gatha Mayer, MD;  Location: [facility];  Service: Endoscopy;  Laterality: N/A;  . FOOT SURGERY Right 05/22/2014   Dr.Strom   . [place]   DR [place]   . TONSILLECTOMY AND ADENOIDECTOMY  1974  . TUBAL LIGATION  1973   Social History:   reports that  has never smoked. she has never used smokeless tobacco. She reports that she does not drink alcohol or use drugs.  Family History  Problem  Relation Age of Onset  . Heart attack Father        age 47  . Heart disease Father     Medications:   Medication List        Accurate as of 08/20/17 10:44 AM. Always use your most recent med list.          amitriptyline 10 MG tablet Commonly known as:  ELAVIL TAKE 1 TABLET BY MOUTH AT BEDTIME TO  HELP  RELAX  MUSCLES   Biotin 1000 MCG tablet Take 1  tablet (1 mg total) by mouth 2 (two) times daily.   ENBREL SURECLICK 50 MG/ML injection Generic drug:  etanercept   Fish Oil 1000 MG Caps   fluticasone 50 MCG/ACT nasal spray Commonly known as:  FLONASE Place 1 spray into the nose daily.   folic acid 1 MG tablet Commonly known as:  FOLVITE Take one tablet every morning.   GLUCOSAMINE CHONDR 500 COMPLEX PO   hydrochlorothiazide 12.5 MG capsule Commonly known as:  MICROZIDE TAKE ONE CAPSULE BY MOUTH ONCE DAILY FOR BLOOD PRESSURE   losartan 25 MG tablet Commonly known as:  COZAAR Take 1 tablet (25 mg total) by mouth daily.   multivitamin tablet   predniSONE 5 MG tablet Commonly known as:  DELTASONE TAKE 1 TABLET BY MOUTH WITH BREAKFAST   traMADol 50 MG tablet Commonly known as:  ULTRAM TAKE 1 TO 2 TABLETS BY MOUTH UP TO 4 TIMES DAILY AS NEEDED   triamcinolone ointment 0.1 % Commonly known as:  KENALOG Apply 1 application topically 2 (two) times daily. For 5-7 days   TYLENOL 500 MG tablet Generic drug:  acetaminophen   urea 20 % cream Commonly known as:  CARMOL Apply to callus daily        Physical Exam:  Vitals:   08/20/17 1015  BP: (!) 142/74  Pulse: 78  Temp: 98.1 F (36.7 C)  TempSrc: Oral  SpO2: 96%  Weight: 111 lb 6.4 oz (50.5 kg)  Height: 4' 9"  (1.448 m)   Body mass index is 24.11 kg/m.  Physical Exam  Constitutional: She is oriented to person, place, and time. No distress.  Frail appearing lady  HENT:  Head: Normocephalic and atraumatic.  Eyes: Pupils are equal, round, and reactive to light.  Cardiovascular: Normal rate, regular rhythm and normal heart sounds.  Pulmonary/Chest: Effort normal and breath sounds normal.  Musculoskeletal: She exhibits edema (trace bilaterally).  Neurological: She is alert and oriented to person, place, and time. No cranial nerve deficit.  Skin:  thinning hair  Psychiatric: She has a normal mood and affect.    Labs reviewed: Basic Metabolic  Panel: Recent Labs    10/27/16 0808 06/13/17 0828  NA 142 141  K 3.9 4.3  CL 102 102  CO2 31 34*  GLUCOSE 83 79  BUN 24 27*  CREATININE 0.81 0.73  CALCIUM 9.0 9.0  TSH 7.02* 6.99*   Liver Function Tests: Recent Labs    10/27/16 0808 06/13/17 0828  AST 16 15  ALT 10 13  ALKPHOS 39  --   BILITOT 0.4 0.3  PROT 6.4 6.2  ALBUMIN 3.9  --    No results for input(s): LIPASE, AMYLASE in the last 8760 hours. No results for input(s): AMMONIA in the last 8760 hours. CBC: Recent Labs    10/27/16 0814  02/08/17 04/02/17 1417 06/13/17 0828  WBC 10.2   < > 14.1 10.0 12.1*  NEUTROABS 4,284  --  10 7,300 7,006  HGB  13.5   < > 13.2 12.3 13.0  HCT 42.4   < > 40 37.9 38.6  MCV 103.9*  --   --  97.9 92.1  PLT 291   < > 308 328 306   < > = values in this interval not displayed.   Lipid Panel: Recent Labs    06/13/17 0828  CHOL 181  HDL 84  TRIG 81  CHOLHDL 2.2   TSH: Recent Labs    10/27/16 0808 06/13/17 0828  TSH 7.02* 6.99*   A1C: No results found for: HGBA1C   Assessment/Plan 1. Essential hypertension -not at goal. Will increase losartan to 50 mg daily. To cont HCTZ 12.5 mg daily - losartan (COZAAR) 50 MG tablet; Take 1 tablet (50 mg total) by mouth daily.  Dispense: 90 tablet; Refill: 1 - BMP with eGFR -cont dash diet -to notify if blood pressure remaining elevated goal <140/90  Next appt: with Dr Mariea Clonts in 2 months as scheduled.  Carlos American. Harle Battiest  [facility] [phone] 8 am - 5 pm) [phone] (after hours)

## 2017-08-20 NOTE — Patient Instructions (Signed)
Increase losartan to 50 mg by mouth daily   GOAL blood pressure <140/90.    DASH Eating Plan DASH stands for "Dietary Approaches to Stop Hypertension." The DASH eating plan is a healthy eating plan that has been shown to reduce high blood pressure (hypertension). It may also reduce your risk for type 2 diabetes, heart disease, and stroke. The DASH eating plan may also help with weight loss. What are tips for following this plan? General guidelines  Avoid eating more than 2,300 mg (milligrams) of salt (sodium) a day. If you have hypertension, you may need to reduce your sodium intake to 1,500 mg a day.  Limit alcohol intake to no more than 1 drink a day for nonpregnant women and 2 drinks a day for men. One drink equals 12 oz of beer, 5 oz of wine, or 1 oz of hard liquor.  Work with your health care provider to maintain a healthy body weight or to lose weight. Ask what an ideal weight is for you.  Get at least 30 minutes of exercise that causes your heart to beat faster (aerobic exercise) most days of the week. Activities may include walking, swimming, or biking.  Work with your health care provider or diet and nutrition specialist (dietitian) to adjust your eating plan to your individual calorie needs. Reading food labels  Check food labels for the amount of sodium per serving. Choose foods with less than 5 percent of the Daily Value of sodium. Generally, foods with less than 300 mg of sodium per serving fit into this eating plan.  To find whole grains, look for the word "whole" as the first word in the ingredient list. Shopping  Buy products labeled as "low-sodium" or "no salt added."  Buy fresh foods. Avoid canned foods and premade or frozen meals. Cooking  Avoid adding salt when cooking. Use salt-free seasonings or herbs instead of table salt or sea salt. Check with your health care provider or pharmacist before using salt substitutes.  Do not fry foods. Cook foods using healthy  methods such as baking, boiling, grilling, and broiling instead.  Cook with heart-healthy oils, such as olive, canola, soybean, or sunflower oil. Meal planning   Eat a balanced diet that includes: ? 5 or more servings of fruits and vegetables each day. At each meal, try to fill half of your plate with fruits and vegetables. ? Up to 6-8 servings of whole grains each day. ? Less than 6 oz of lean meat, poultry, or fish each day. A 3-oz serving of meat is about the same size as a deck of cards. One egg equals 1 oz. ? 2 servings of low-fat dairy each day. ? A serving of nuts, seeds, or beans 5 times each week. ? Heart-healthy fats. Healthy fats called Omega-3 fatty acids are found in foods such as flaxseeds and coldwater fish, like sardines, salmon, and mackerel.  Limit how much you eat of the following: ? Canned or prepackaged foods. ? Food that is high in trans fat, such as fried foods. ? Food that is high in saturated fat, such as fatty meat. ? Sweets, desserts, sugary drinks, and other foods with added sugar. ? Full-fat dairy products.  Do not salt foods before eating.  Try to eat at least 2 vegetarian meals each week.  Eat more home-cooked food and less restaurant, buffet, and fast food.  When eating at a restaurant, ask that your food be prepared with less salt or no salt, if possible. What foods  are recommended? The items listed may not be a complete list. Talk with your dietitian about what dietary choices are best for you. Grains Whole-grain or whole-wheat bread. Whole-grain or whole-wheat pasta. Brown rice. [place]. Bulgur. Whole-grain and low-sodium cereals. Pita bread. Low-fat, low-sodium crackers. Whole-wheat flour tortillas. Vegetables Fresh or frozen vegetables (raw, steamed, roasted, or grilled). Low-sodium or reduced-sodium tomato and vegetable juice. Low-sodium or reduced-sodium tomato sauce and tomato paste. Low-sodium or reduced-sodium canned  vegetables. Fruits All fresh, dried, or frozen fruit. Canned fruit in natural juice (without added sugar). Meat and other protein foods Skinless chicken or [place]. Ground chicken or [place]. Pork with fat trimmed off. Fish and seafood. Egg whites. Dried beans, peas, or lentils. Unsalted nuts, nut butters, and seeds. Unsalted canned beans. Lean cuts of beef with fat trimmed off. Low-sodium, lean deli meat. Dairy Low-fat (1%) or fat-free (skim) milk. Fat-free, low-fat, or reduced-fat cheeses. Nonfat, low-sodium ricotta or cottage cheese. Low-fat or nonfat yogurt. Low-fat, low-sodium cheese. Fats and oils Soft margarine without trans fats. Vegetable oil. Low-fat, reduced-fat, or light mayonnaise and salad dressings (reduced-sodium). Canola, safflower, olive, soybean, and sunflower oils. Avocado. Seasoning and other foods Herbs. Spices. Seasoning mixes without salt. Unsalted popcorn and pretzels. Fat-free sweets. What foods are not recommended? The items listed may not be a complete list. Talk with your dietitian about what dietary choices are best for you. Grains Baked goods made with fat, such as croissants, muffins, or some breads. Dry pasta or rice meal packs. Vegetables Creamed or fried vegetables. Vegetables in a cheese sauce. Regular canned vegetables (not low-sodium or reduced-sodium). Regular canned tomato sauce and paste (not low-sodium or reduced-sodium). Regular tomato and vegetable juice (not low-sodium or reduced-sodium). Angie Fava. Olives. Fruits Canned fruit in a light or heavy syrup. Fried fruit. Fruit in cream or butter sauce. Meat and other protein foods Fatty cuts of meat. Ribs. Fried meat. Berniece Salines. Sausage. Bologna and other processed lunch meats. Salami. Fatback. Hotdogs. Bratwurst. Salted nuts and seeds. Canned beans with added salt. Canned or smoked fish. Whole eggs or egg yolks. Chicken or [place] with skin. Dairy Whole or 2% milk, cream, and half-and-half. Whole or full-fat  cream cheese. Whole-fat or sweetened yogurt. Full-fat cheese. Nondairy creamers. Whipped toppings. Processed cheese and cheese spreads. Fats and oils Butter. Stick margarine. Lard. Shortening. Ghee. Bacon fat. Tropical oils, such as coconut, palm kernel, or palm oil. Seasoning and other foods Salted popcorn and pretzels. Onion salt, garlic salt, seasoned salt, table salt, and sea salt. Worcestershire sauce. Tartar sauce. Barbecue sauce. Teriyaki sauce. Soy sauce, including reduced-sodium. Steak sauce. Canned and packaged gravies. Fish sauce. Oyster sauce. Cocktail sauce. Horseradish that you find on the shelf. Ketchup. Mustard. Meat flavorings and tenderizers. Bouillon cubes. Hot sauce and Tabasco sauce. Premade or packaged marinades. Premade or packaged taco seasonings. Relishes. Regular salad dressings. Where to find more information:  National Heart, Lung, and [place]: https://wilson-eaton.com/  American Heart Association: www.heart.org Summary  The DASH eating plan is a healthy eating plan that has been shown to reduce high blood pressure (hypertension). It may also reduce your risk for type 2 diabetes, heart disease, and stroke.  With the DASH eating plan, you should limit salt (sodium) intake to 2,300 mg a day. If you have hypertension, you may need to reduce your sodium intake to 1,500 mg a day.  When on the DASH eating plan, aim to eat more fresh fruits and vegetables, whole grains, lean proteins, low-fat dairy, and heart-healthy fats.  Work with your  health care provider or diet and nutrition specialist (dietitian) to adjust your eating plan to your individual calorie needs. This information is not intended to replace advice given to you by your health care provider. Make sure you discuss any questions you have with your health care provider. Document Released: 08/10/2011 Document Revised: 08/14/2016 Document Reviewed: 08/14/2016 Elsevier Interactive Patient Education  2017 [employer].

## 2017-08-20 NOTE — Telephone Encounter (Signed)
Patient called and stated pharmacy has not received Rx. Confirmed pharmacy and refaxed

## 2017-08-21 IMAGING — MR MR LUMBAR SPINE W/O CM
5 of 6 series · 28 of 48 positions shown · non-contrast
Comparison: None.

CLINICAL DATA: Low back pain with bilateral leg pain

EXAM:
MRI LUMBAR SPINE WITHOUT CONTRAST
TECHNIQUE: Multiplanar, multisequence MR imaging of the lumbar spine was
performed. No intravenous contrast was administered.

[Series 4: T2 · sagittal · 4.0mm · 0.41mm/px · 6 of 14 slices shown (1 of 3)]
[im 1/14]
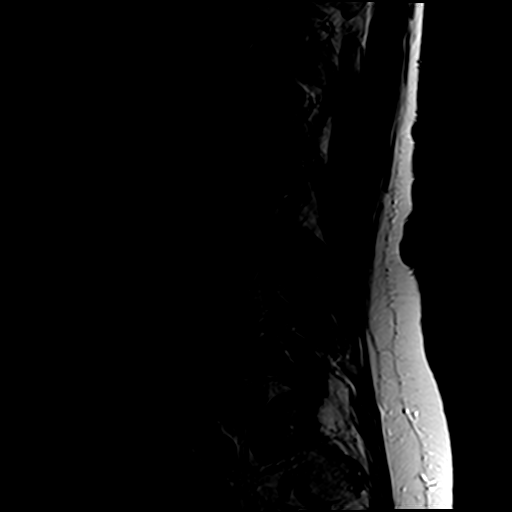
[im 3/14]
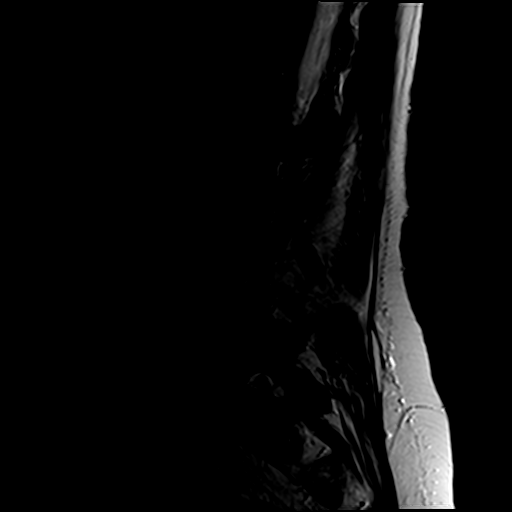
[im 6/14]
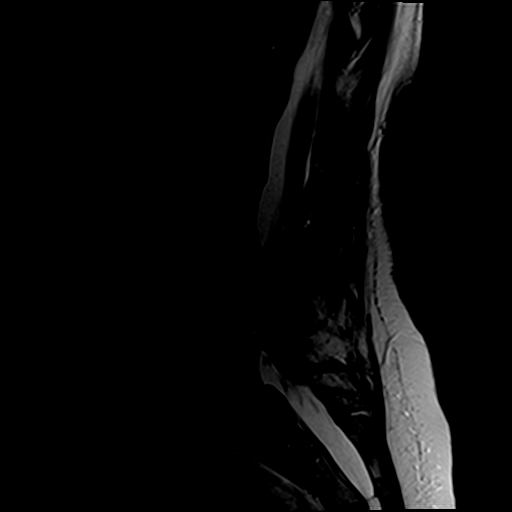
[im 8/14]
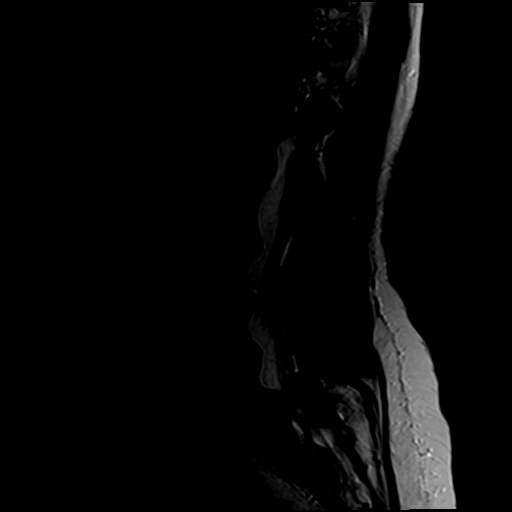
[im 11/14]
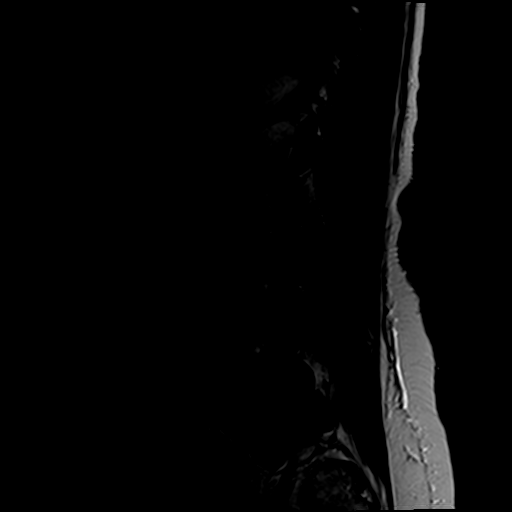
[im 14/14]
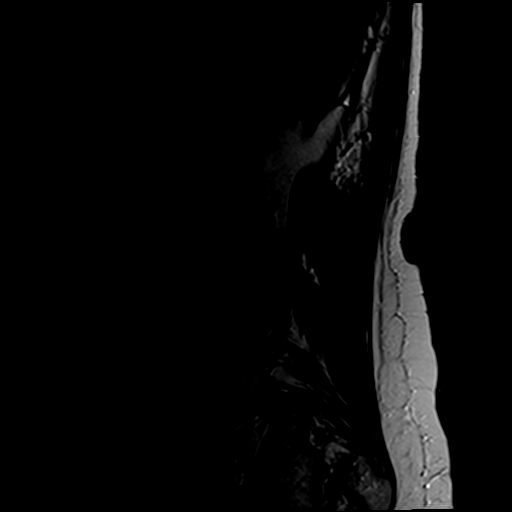

[Series 5: T1 · sagittal · 4.0mm · 0.41mm/px · 6 of 14 slices shown (1 of 2)]
[im 1/14]
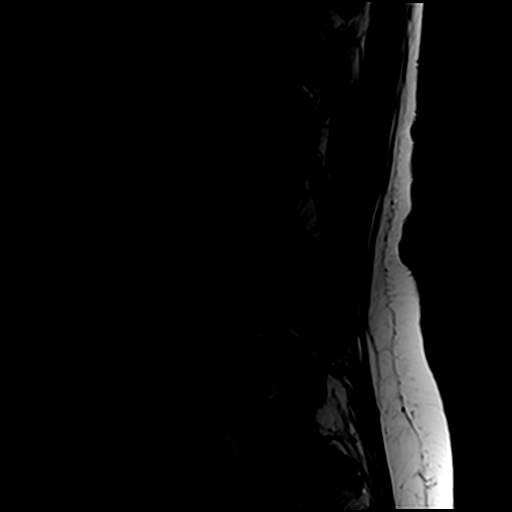
[im 3/14]
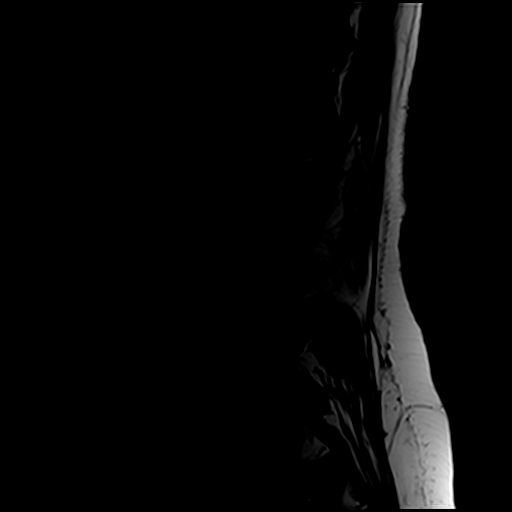
[im 6/14]
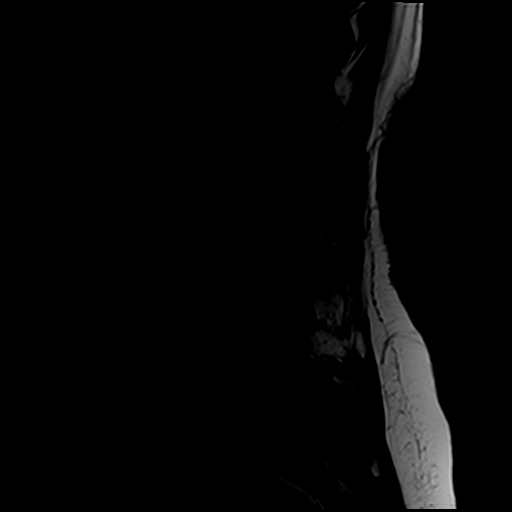
[im 8/14]
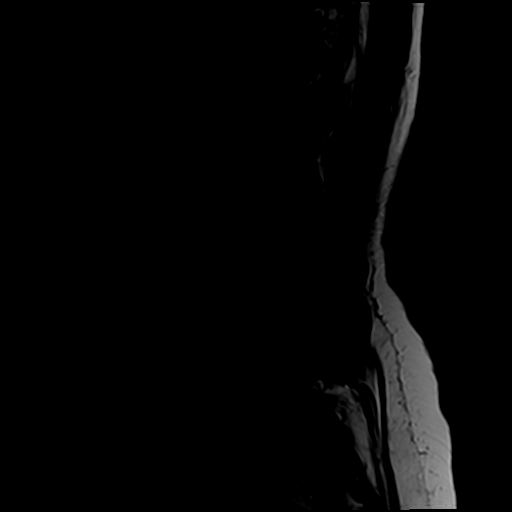
[im 11/14]
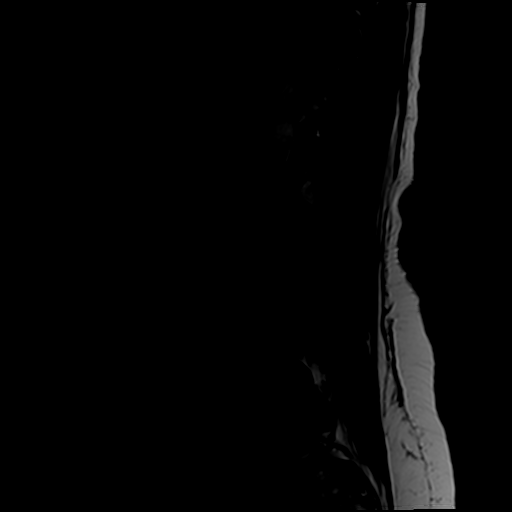
[im 14/14]
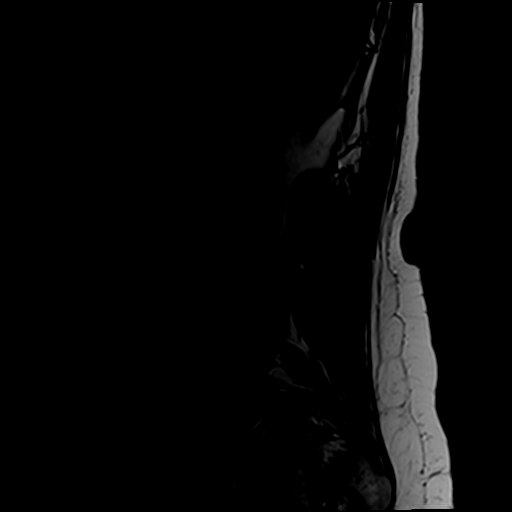

[Series 7: T2 · coronal · 4.0mm · 0.45mm/px · 6 of 14 slices shown (2 of 3)]
[im 1/14]
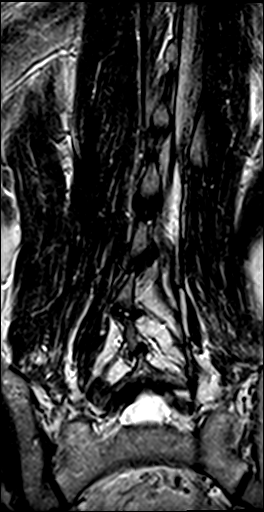
[im 3/14]
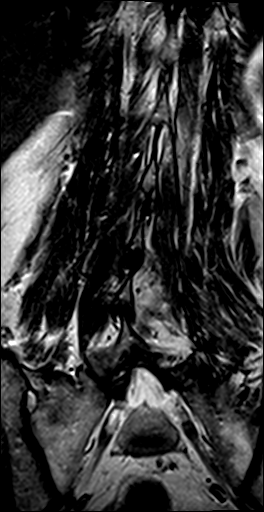
[im 6/14]
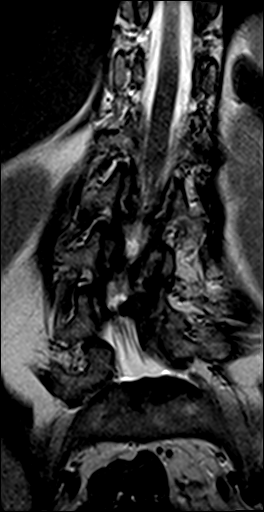
[im 8/14]
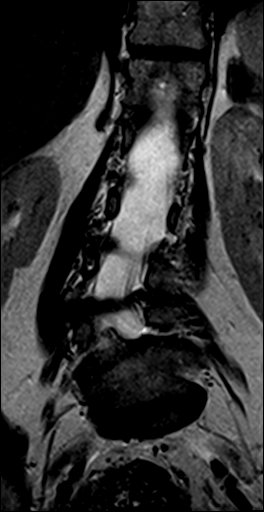
[im 11/14]
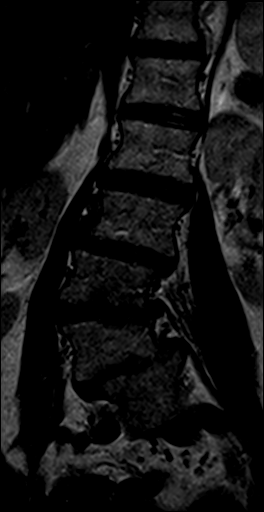
[im 14/14]
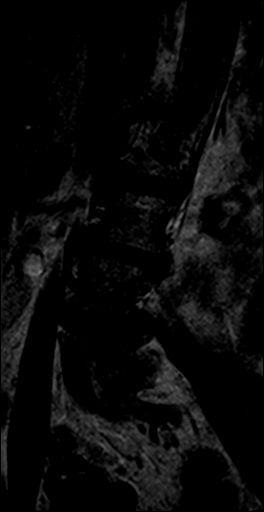

[Series 8: T2 · axial · 4.0mm · 0.70mm/px · z∈[-72,+131]mm · 9 of 26 slices shown (3 of 3)]
[im 1/26]
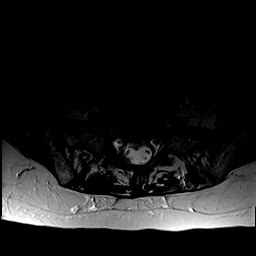
[im 5/26]
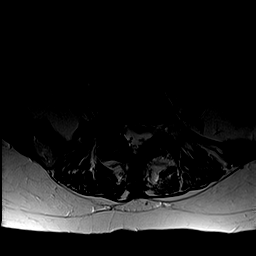
[im 7/26]
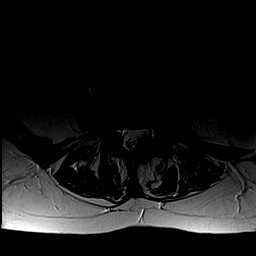
[im 12/26]
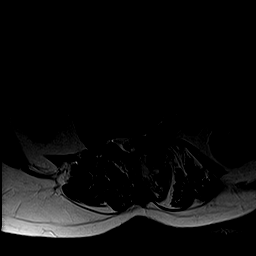
[im 14/26]
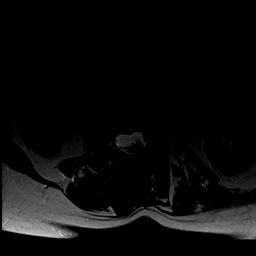
[im 19/26]
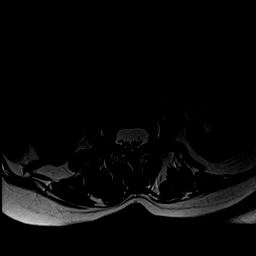
[im 21/26]
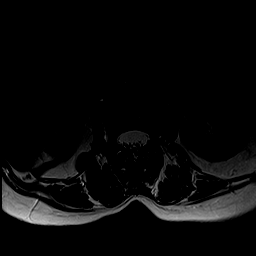
[im 23/26]
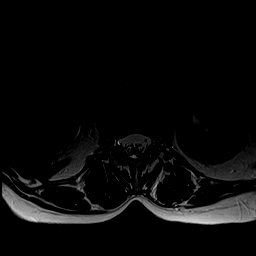
[im 26/26]
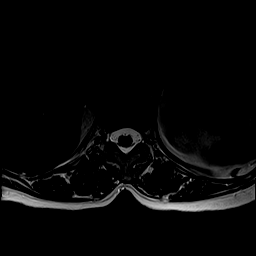

[Series 9: T1 · axial · 4.0mm · 0.70mm/px · 1 of 26 slices shown (2 of 2)]
[im 1/26]
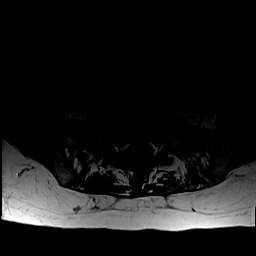

[28 of 48 positions shown; findings below may reference images not displayed]

FINDINGS: Segmentation:  Standard

Alignment: Severe dextroscoliosis of the lower lumbar spine. 10 mm
of anterolisthesis of L5 on S1 secondary to facet disease.

Vertebrae:  No fracture, evidence of discitis, or bone lesion.

Conus medullaris: Extends to the L1 level and appears normal.

Paraspinal and other soft tissues: Negative

Disc levels:

Disc spaces: Degenerative disc disease with disc height loss at
L3-4, L4-5 and L5-S1.

T12-L1: No significant disc bulge. No evidence of neural foraminal
stenosis. No central canal stenosis.

L1-L2: No significant disc bulge. No evidence of neural foraminal
stenosis. No central canal stenosis.

L2-L3: Mild broad-based disc bulge flattening the ventral thecal
sac. Bilateral lateral recess narrowing. No evidence of neural
foraminal stenosis. No central canal stenosis.

L3-L4: Broad-based disc bulge. Severe left and mild right facet
arthropathy. Severe left lateral recess stenosis. Severe left
foraminal stenosis. No right foraminal stenosis.

L4-L5: Mild broad-based disc bulge. Mild right facet arthropathy.
Moderate left facet arthropathy. Moderate left foraminal stenosis.
Mild right foraminal stenosis. Left lateral recess stenosis.

L5-S1: Uncovering of the disc secondary to grade 1 anterolisthesis.
No disc protrusion. Moderate right and mild left facet arthropathy.
Severe right foraminal stenosis. No left foraminal stenosis. No
central canal stenosis.
IMPRESSION: 1. At L3-4 there is a broad-based disc bulge. Severe left and mild
right facet arthropathy. Severe left lateral recess stenosis. Severe
left foraminal stenosis.
2. At L4-5 there is a mild broad-based disc bulge. Mild right facet
arthropathy. Moderate left facet arthropathy. Moderate left
foraminal stenosis. Mild right foraminal stenosis. Left lateral
recess stenosis.
3. Grade 1 anterolisthesis of L5 on S1 secondary to facet disease.
At at L5-S1 there is uncovering of the disc secondary to grade 1
anterolisthesis. No disc protrusion. Moderate right and mild left
facet arthropathy. Severe right foraminal stenosis.

## 2017-08-27 ENCOUNTER — Other Ambulatory Visit: Payer: Self-pay | Admitting: Neurosurgery

## 2017-08-27 DIAGNOSIS — M545 Low back pain: Principal | ICD-10-CM

## 2017-08-27 DIAGNOSIS — G8929 Other chronic pain: Secondary | ICD-10-CM

## 2017-09-03 ENCOUNTER — Ambulatory Visit
Admission: RE | Admit: 2017-09-03 | Discharge: 2017-09-03 | Disposition: A | Discharge status: Home / Self Care | Payer: Medicare Other | Source: Ambulatory Visit | Attending: Neurosurgery | Admitting: Neurosurgery

## 2017-09-03 DIAGNOSIS — G8929 Other chronic pain: Secondary | ICD-10-CM

## 2017-09-03 DIAGNOSIS — M545 Low back pain: Principal | ICD-10-CM

## 2017-09-06 ENCOUNTER — Ambulatory Visit: Payer: Medicare Other | Admitting: Internal Medicine

## 2017-09-12 ENCOUNTER — Other Ambulatory Visit: Payer: Self-pay | Admitting: Neurosurgery

## 2017-09-12 DIAGNOSIS — M545 Low back pain: Principal | ICD-10-CM

## 2017-09-12 DIAGNOSIS — G8929 Other chronic pain: Secondary | ICD-10-CM

## 2017-09-18 ENCOUNTER — Other Ambulatory Visit: Payer: Self-pay | Admitting: Internal Medicine

## 2017-09-18 DIAGNOSIS — I10 Essential (primary) hypertension: Secondary | ICD-10-CM

## 2017-09-20 ENCOUNTER — Telehealth: Payer: Self-pay | Admitting: Internal Medicine

## 2017-09-20 NOTE — Telephone Encounter (Signed)
Left msg asking pt to confirm this AWV-S w/ nurse. Last AWV 10/31/16. VDM (DD)

## 2017-09-22 ENCOUNTER — Other Ambulatory Visit: Payer: Self-pay | Admitting: Nurse Practitioner

## 2017-09-22 DIAGNOSIS — M059 Rheumatoid arthritis with rheumatoid factor, unspecified: Secondary | ICD-10-CM

## 2017-09-24 NOTE — Telephone Encounter (Signed)
[place] Database verified and compliance confirmed   When attempting to refill a medication a major warning populated indicating a contraindication between Amitriptyline and Tramadol, please advise if ok to fill  I was unable to copy and paste the reaction into the documentation portion of the encounter

## 2017-09-26 DIAGNOSIS — I10 Essential (primary) hypertension: Secondary | ICD-10-CM

## 2017-09-26 NOTE — Telephone Encounter (Signed)
This encounter was created in error - please disregard.

## 2017-09-27 ENCOUNTER — Telehealth: Payer: Self-pay

## 2017-09-27 NOTE — Telephone Encounter (Signed)
Patient aware of response and will call the pharmacy for complete comfort on this situation

## 2017-09-27 NOTE — Telephone Encounter (Signed)
If she was on the brand that was said to cause cancer, her pharmacy would have contacted [place] for an alternative.

## 2017-09-27 NOTE — Telephone Encounter (Signed)
Patient states on the news last night they discussed Losartan can cause cancer and she is afraid to take medication.  Patient is requesting an alternative, please advise  Patient would like for someone to call her on cell phone, if patient is not available do not leave a message, call home phone to leave message.

## 2017-10-01 ENCOUNTER — Ambulatory Visit: Payer: Self-pay | Admitting: Internal Medicine

## 2017-10-02 ENCOUNTER — Other Ambulatory Visit: Payer: Medicare Other

## 2017-10-12 ENCOUNTER — Ambulatory Visit
Admission: RE | Admit: 2017-10-12 | Discharge: 2017-10-12 | Disposition: A | Discharge status: Home / Self Care | Payer: Medicare Other | Source: Ambulatory Visit | Attending: Neurosurgery | Admitting: Neurosurgery

## 2017-10-12 DIAGNOSIS — G8929 Other chronic pain: Secondary | ICD-10-CM

## 2017-10-12 DIAGNOSIS — M545 Low back pain: Principal | ICD-10-CM

## 2017-10-12 MED ORDER — IOPAMIDOL (ISOVUE-M 200) INJECTION 41%
1.0000 mL | Freq: Once | INTRAMUSCULAR | Status: AC
  Administered 2017-10-12: 1 mL via EPIDURAL

## 2017-10-12 MED ORDER — METHYLPREDNISOLONE ACETATE 40 MG/ML INJ SUSP (RADIOLOG
120.0000 mg | Freq: Once | INTRAMUSCULAR | Status: AC
  Administered 2017-10-12: 120 mg via EPIDURAL

## 2017-10-12 NOTE — Discharge Instructions (Signed)

## 2017-10-22 ENCOUNTER — Ambulatory Visit (INDEPENDENT_AMBULATORY_CARE_PROVIDER_SITE_OTHER): Payer: Medicare Other

## 2017-10-22 ENCOUNTER — Encounter: Payer: Self-pay | Admitting: Internal Medicine

## 2017-10-22 ENCOUNTER — Telehealth: Payer: Self-pay

## 2017-10-22 ENCOUNTER — Ambulatory Visit: Payer: Medicare Other | Admitting: Internal Medicine

## 2017-10-22 VITALS — BP 150/70 | HR 75 | Temp 98.1°F | Ht <= 58 in | Wt 106.0 lb

## 2017-10-22 VITALS — BP 138/70 | HR 75 | Temp 98.1°F | Ht <= 58 in | Wt 106.0 lb

## 2017-10-22 DIAGNOSIS — H6123 Impacted cerumen, bilateral: Secondary | ICD-10-CM

## 2017-10-22 DIAGNOSIS — M419 Scoliosis, unspecified: Secondary | ICD-10-CM | POA: Diagnosis not present

## 2017-10-22 DIAGNOSIS — M5387 Other specified dorsopathies, lumbosacral region: Secondary | ICD-10-CM

## 2017-10-22 DIAGNOSIS — I10 Essential (primary) hypertension: Secondary | ICD-10-CM

## 2017-10-22 DIAGNOSIS — Z0000 Encounter for general adult medical examination without abnormal findings: Secondary | ICD-10-CM | POA: Diagnosis not present

## 2017-10-22 MED ORDER — LIDOCAINE 5 % EX PTCH: 1.0000 | MEDICATED_PATCH | CUTANEOUS | 3 refills | Status: DC

## 2017-10-22 MED ORDER — ZOSTER VAC RECOMB ADJUVANTED 50 MCG/0.5ML IM SUSR: 0.5000 mL | Freq: Once | INTRAMUSCULAR | 1 refills | Status: AC

## 2017-10-22 NOTE — Patient Instructions (Addendum)
Try to walk or get involved swimming aerobics. That will help your fibromyalgia.  Call office if Blood pressure is consistently over 140/90. Keep your cardiology appt.  Take blood pressure at least an hour after taking medicines so they have time to work. Compare to drug store or [facility] blood pressure cuff reading.   Salon Pas patches with lidocaine are only 1% lower than prescription incase insurance doesn't provide good coverage. I did send lidocaine prescription patches into your pharmacy and you may pick them up if they are affordable for you.  Complete your living will and health care power of attorney documents and bring [place] a copy for your records.

## 2017-10-22 NOTE — Progress Notes (Signed)
Provider:  Rexene Edison. Mariea Clonts, D.O., C.M.D. Location:      Place of Service:     Previous PCP: Gayland Curry, DO Patient Care Team: Gayland Curry, DO as PCP - General (Geriatric Medicine) Hennie Duos, MD as Consulting Physician (Rheumatology) Altheimer, Legrand Como, MD as Consulting Physician (Endocrinology) Newman Pies, MD as Consulting Physician (Neurosurgery) Clydell Hakim, MD as Consulting Physician (Anesthesiology) Rolla Flatten, MD as Consulting Physician (Diagnostic Radiology) Jolene Schimke, MD as Referring Physician (Dermatology) Lynnell Dike, OD as Consulting Physician [facility])  Extended Emergency Contact Information Primary Emergency Contact: Delila Pereyra of [place] Phone: [phone] Relation: Son Secondary Emergency Contact: Burgess Estelle of [place] Phone: [phone] Mobile Phone: [phone] Relation: Spouse  Code Status: Full  Goals of Care: Advanced Directive information Advanced Directives 10/22/2017  Does Patient Have a Medical Advance Directive? No  Would patient like information on creating a medical advance directive? Yes (MAU/Ambulatory/Procedural Areas - Information given)      Chief Complaint  Patient presents with  . Medical Management of Chronic Issues    92mth follow-up  . ACP    no ACP    HPI: Patient is a 72 y.o. female seen today for management of chronic disease.  Picked up a box about 1-2 weeks prior to Christmas and notice a lot of pain in lower back. Went to see Dr. Arnoldo Morale and was told she would have to have surgery most likely to fix it. She is unsure as to what was explain to her at the appointment. But she does know she was told by him post MRI that she had degenerative disk disease and scoliosis. She is requesting to have some Lidoderm patches, as she has used some of her mothers and has found that it helped ease her pain. Did have depo-med injection on 10/12/2017 this did not  help, she has had these in past for back pain, but this one did not.   Today in the office she feels okay, 7/10. Pain achy/nagging pain that runs from buttocks/hip down the front part of the leg. She took 100mg  tramadol prior coming in today, but feels like she needs something strong, but does not want to be "knocked out" from pain medicine. Sleeps okay, but has trouble walking until medicine kicks in. Denies trouble getting dress or difficulty bending. Denies numbness, tingling, burning sensation of the leg, foot, or thigh.   Left ear is blocked with wax, Right is not as bad she said, would like both ears cleaned out today if possible.     Past Medical History:  Diagnosis Date  . Alopecia areata 02/16/2006  . Anxiety state, unspecified 2004  . Benign paroxysmal positional vertigo 04/01/03  . Cervicalgia 04/01/03  . Chronic rhinitis 09/01/05  . Corns and callosities   . Dermatophytosis of nail   . Diarrhea 04/15/2015  . Diverticulosis   . Dysphagia, unspecified(787.20) 2004  . Enthesopathy of ankle and tarsus, unspecified 03/31/96  . External hemorrhoids without mention of complication   . 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 Fibromyalgia   . Hypertension 2004  . Hypothyroidism   . Impacted cerumen    right ear  . Lumbago   . Myalgia and myositis, unspecified 03/01/05  . Nontoxic uninodular goiter   . Osteoarthrosis, unspecified whether generalized or localized, unspecified site 04/01/03  . Other left bundle branch block   . Rheumatoid arthritis(714.0) 10/05/2004   lumbar spondylosis. Lumbo-sacral anterolisthesis    . Senile osteoporosis 04/01/03  .  Sprain of metatarsophalangeal (joint) of foot   . Thyrotoxicosis   . Thyrotoxicosis without mention of goiter or other cause, without mention of thyrotoxic crisis or storm   . Unspecified vitamin D deficiency   . Urinary frequency    Past Surgical History:  Procedure Laterality Date  . COLONOSCOPY    . COLONOSCOPY N/A 04/15/2015    Procedure: COLONOSCOPY;  Surgeon: Gatha Mayer, MD;  Location: [facility];  Service: Endoscopy;  Laterality: N/A;  . FOOT SURGERY Right 05/22/2014   Dr.Strom   . [place]   DR [place]   . TONSILLECTOMY AND ADENOIDECTOMY  1974  . TUBAL LIGATION  1973    reports that  has never smoked. she has never used smokeless tobacco. She reports that she does not drink alcohol or use drugs.  Functional Status Survey:    Family History  Problem Relation Age of Onset  . Heart attack Father        age 27  . Heart disease Father     Health Maintenance  Topic Date Due  . Hepatitis C Screening  [birth year]  . TETANUS/TDAP  11/02/2017 (Originally 09/05/2015)  . MAMMOGRAM  02/14/2019  . COLONOSCOPY  04/14/2025  . INFLUENZA VACCINE  Completed  . DEXA SCAN  Completed  . PNA vac Low Risk Adult  Completed    Allergies  Allergen Reactions  . Arava [Leflunomide] Diarrhea, Nausea Only and Other (See Comments)    Loss appetite  . Methotrexate Derivatives Other (See Comments)    Hair loss  . Neurontin [Gabapentin] Nausea And Vomiting  . Penicillins Rash    Outpatient Encounter Medications as of 10/22/2017  Medication Sig  . acetaminophen (TYLENOL) 500 MG tablet Take 500 mg by mouth every 6 (six) hours as needed for pain. Take two tablets up to four times a day as needed  . amitriptyline (ELAVIL) 10 MG tablet TAKE 1 TABLET BY MOUTH AT BEDTIME TO  HELP  RELAX  MUSCLES  . Biotin 1000 MCG tablet Take 1 tablet (1 mg total) by mouth 2 (two) times daily.  Scarlette Shorts SURECLICK 50 MG/ML injection Inject 50 mg as directed once a week. Inject once weekly for arthritis  . fluticasone (FLONASE) 50 MCG/ACT nasal spray Place 1 spray into the nose daily.  . folic acid (FOLVITE) 1 MG tablet Take one tablet every morning.  . Glucosamine-Chondroit-Vit C-Mn (GLUCOSAMINE CHONDR 500 COMPLEX PO) Take 500 mg by mouth daily. Take 2 tablets daily for  joint health.  . hydrochlorothiazide (MICROZIDE) 12.5 MG capsule TAKE 1 CAPSULE BY MOUTH ONCE DAILY FOR BLOOD PRESSURE  . losartan (COZAAR) 50 MG tablet Take 1 tablet (50 mg total) by mouth daily.  . Multiple Vitamin (MULTIVITAMIN) tablet Take 1 tablet by mouth daily. Take 1 tablet once a daily  . Omega-3 Fatty Acids (FISH OIL) 1000 MG CAPS Take 1,000 mg by mouth daily. Take one tablet once a daily for heart, joint and skin health.  . predniSONE (DELTASONE) 5 MG tablet TAKE 1 TABLET BY MOUTH WITH BREAKFAST  . traMADol (ULTRAM) 50 MG tablet 1 tab 4 times daily as needed for moderate pain and 2 tabs 4 times daily as needed for severe pain  . urea (CARMOL) 20 % cream Apply to callus daily   No facility-administered encounter medications on file as of 10/22/2017.     Review of Systems  Constitutional: Negative for chills, fever and  malaise/fatigue.  HENT:       "stopped up, full of wax"  Respiratory: Negative.   Cardiovascular: Negative.   Musculoskeletal: Positive for back pain and myalgias.  Neurological: Negative for tingling, tremors, sensory change and focal weakness.  Psychiatric/Behavioral: The patient does not have insomnia.     Vitals:   10/22/17 0858  BP: (!) 150/70  Pulse: 75  Temp: 98.1 F (36.7 C)  TempSrc: Oral  SpO2: 95%  Weight: 106 lb (48.1 kg)  Height: 4\' 9"  (1.448 m)   Body mass index is 22.94 kg/m. Physical Exam  Constitutional: She is oriented to person, place, and time. She appears well-developed and well-nourished.  HENT:  Cerumen impaction bilaterally  Cardiovascular: Normal rate, regular rhythm, normal heart sounds and intact distal pulses.  Pulmonary/Chest: Effort normal and breath sounds normal.  Musculoskeletal: Normal range of motion.  Neurological: She is alert and oriented to person, place, and time.  Skin: Skin is warm and dry.  Vitals reviewed.   Labs reviewed: Basic Metabolic Panel: Recent Labs    10/27/16 0808 06/13/17 0828  08/20/17 1109  NA 142 141 140  K 3.9 4.3 4.1  CL 102 102 102  CO2 31 34* 32  GLUCOSE 83 79 96  BUN 24 27* 31*  CREATININE 0.81 0.73 0.67  CALCIUM 9.0 9.0 9.0   Liver Function Tests: Recent Labs    10/27/16 0808 06/13/17 0828  AST 16 15  ALT 10 13  ALKPHOS 39  --   BILITOT 0.4 0.3  PROT 6.4 6.2  ALBUMIN 3.9  --    No results for input(s): LIPASE, AMYLASE in the last 8760 hours. No results for input(s): AMMONIA in the last 8760 hours. CBC: Recent Labs    10/27/16 0814  02/08/17 04/02/17 1417 06/13/17 0828  WBC 10.2   < > 14.1 10.0 12.1*  NEUTROABS 4,284  --  10 7,300 7,006  HGB 13.5   < > 13.2 12.3 13.0  HCT 42.4   < > 40 37.9 38.6  MCV 103.9*  --   --  97.9 92.1  PLT 291   < > 308 328 306   < > = values in this interval not displayed.   Cardiac Enzymes: No results for input(s): CKTOTAL, CKMB, CKMBINDEX, TROPONINI in the last 8760 hours. BNP: Invalid input(s): POCBNP No results found for: HGBA1C Lab Results  Component Value Date   TSH 6.99 (H) 06/13/2017   No results found for: VITAMINB12 No results found for: FOLATE No results found for: IRON, TIBC, FERRITIN  Imaging and Procedures Recently:  Assessment/Plan   1. Essential hypertension Today in office she was a little elevated on the first reading. Second reading was better. Ask to keep blood pressure log over the next several weeks and to call back if BP stays elevated at 140/90. Maintain current regimen. Will draw labs   - CBC with Differential/Platelet; Future - COMPLETE METABOLIC PANEL WITH GFR; Future - Lipid panel; Future  2. Sciatica of right side associated with disorder of lumbosacral spine Persistent sciatica pain, that has worsen over the last month and half since an incident with lifting a box prior to Christmas. She was send by Dr Arnoldo Morale he stated she might need surgery, but would try a epidural injection again to see if she gets relief. She had this on 10/12/2017 and did not get relief from  it. Will try a lidoderm/salon pas patch, as she stated these helped some.   - lidocaine (LIDODERM) 5 %; Place  1 patch onto the skin daily. Remove & Discard patch within 12 hours or as directed by MD  Dispense: 90 patch; Refill: 3  3. Scoliosis, unspecified scoliosis type, unspecified spinal region She has scoliosis and this causes her chronic pain. She has tried Steroid  Injections for this and her sciatic with previous relief, but she did not experience that this last time. Will try Lidoderm patch.   4. Bilateral impacted cerumen She had bilateral impaction of cerumen and today in the office they were both irrigated to have the wax removed. She tolerated this well.    Labs/tests ordered:   Orders Placed This Encounter  Procedures  . CBC with Differential/Platelet    Standing Status:   Future    Standing Expiration Date:   06/21/2018  . COMPLETE METABOLIC PANEL WITH GFR    Standing Status:   Future    Standing Expiration Date:   06/21/2018  . Lipid panel    Standing Status:   Future    Standing Expiration Date:   06/21/2018     Next visit: 02/18/2018    Karen Kays, RN, DNP Student Geriatrics [facility] [phone], [place] [address] Cell Phone (Mon-Fri 8am-5pm):  [phone] On Call:  [phone] & follow prompts after 5pm & weekends Office Phone:  [phone] Office Fax:  [phone]

## 2017-10-22 NOTE — Telephone Encounter (Signed)
Sent referral to scheduling, attached notes to file.

## 2017-10-22 NOTE — Patient Instructions (Addendum)
Joan [name] , Thank you for taking time to come for your Medicare Wellness Visit. I appreciate your ongoing commitment to your health goals. Please review the following plan we discussed and let me know if I can assist you in the future.   Screening recommendations/referrals: Colonoscopy up to date, due 04/14/2025 Mammogram up to date, due 02/13/2017 Bone Density up to date, due 02/20/2019 Recommended yearly ophthalmology/optometry visit for glaucoma screening and checkup Recommended yearly dental visit for hygiene and checkup Hep C due, let us know if you decide to get this  Vaccinations: Influenza vaccine up to date, due 2019 fall season Pneumococcal vaccine up to date Tdap vaccine up to date, due 09/04/2026 Shingles vaccine due, prescription sent to pharmacy    Advanced directives: Advance directive discussed with you today. I have provided a copy for you to complete at home and have notarized. Once this is complete please bring a copy in to our office so we can scan it into your chart.   Conditions/risks identified: none  Next appointment: Tyson Dense, RN 2/71/2020 @ 8:30am   Preventive Care 71 Years and Older, Female Preventive care refers to lifestyle choices and visits with your health care provider that can promote health and wellness. What does preventive care include?  A yearly physical exam. This is also called an annual well check.  Dental exams once or twice a year.  Routine eye exams. Ask your health care provider how often you should have your eyes checked.  Personal lifestyle choices, including:  Daily care of your teeth and gums.  Regular physical activity.  Eating a healthy diet.  Avoiding tobacco and drug use.  Limiting alcohol use.  Practicing safe sex.  Taking low-dose aspirin every day.  Taking vitamin and mineral supplements as recommended by your health care provider. What happens during an annual well check? The services and screenings done by  your health care provider during your annual well check will depend on your age, overall health, lifestyle risk factors, and family history of disease. Counseling  Your health care provider may ask you questions about your:  Alcohol use.  Tobacco use.  Drug use.  Emotional well-being.  Home and relationship well-being.  Sexual activity.  Eating habits.  History of falls.  Memory and ability to understand (cognition).  Work and work Statistician.  Reproductive health. Screening  You may have the following tests or measurements:  Height, weight, and BMI.  Blood pressure.  Lipid and cholesterol levels. These may be checked every 5 years, or more frequently if you are over 47 years old.  Skin check.  Lung cancer screening. You may have this screening every year starting at age 71 if you have a 30-pack-year history of smoking and currently smoke or have quit within the past 15 years.  Fecal occult blood test (FOBT) of the stool. You may have this test every year starting at age 71.  Flexible sigmoidoscopy or colonoscopy. You may have a sigmoidoscopy every 5 years or a colonoscopy every 10 years starting at age 71.  Hepatitis C blood test.  Hepatitis B blood test.  Sexually transmitted disease (STD) testing.  Diabetes screening. This is done by checking your blood sugar (glucose) after you have not eaten for a while (fasting). You may have this done every 1-3 years.  Bone density scan. This is done to screen for osteoporosis. You may have this done starting at age 71.  Mammogram. This may be done every 1-2 years. Talk to your health  care provider about how often you should have regular mammograms. Talk with your health care provider about your test results, treatment options, and if necessary, the need for more tests. Vaccines  Your health care provider may recommend certain vaccines, such as:  Influenza vaccine. This is recommended every year.  Tetanus,  diphtheria, and acellular pertussis (Tdap, Td) vaccine. You may need a Td booster every 10 years.  Zoster vaccine. You may need this after age 103.  Pneumococcal 13-valent conjugate (PCV13) vaccine. One dose is recommended after age 71.  Pneumococcal polysaccharide (PPSV23) vaccine. One dose is recommended after age 73. Talk to your health care provider about which screenings and vaccines you need and how often you need them. This information is not intended to replace advice given to you by your health care provider. Make sure you discuss any questions you have with your health care provider. Document Released: 09/17/2015 Document Revised: 05/10/2016 Document Reviewed: 06/22/2015 Elsevier Interactive Patient Education  2017 [place] Prevention in the Home Falls can cause injuries. They can happen to people of all ages. There are many things you can do to make your home safe and to help prevent falls. What can I do on the outside of my home?  Regularly fix the edges of walkways and driveways and fix any cracks.  Remove anything that might make you trip as you walk through a door, such as a raised step or threshold.  Trim any bushes or trees on the path to your home.  Use bright outdoor lighting.  Clear any walking paths of anything that might make someone trip, such as rocks or tools.  Regularly check to see if handrails are loose or broken. Make sure that both sides of any steps have handrails.  Any raised decks and porches should have guardrails on the edges.  Have any leaves, snow, or ice cleared regularly.  Use sand or salt on walking paths during winter.  Clean up any spills in your garage right away. This includes oil or grease spills. What can I do in the bathroom?  Use night lights.  Install grab bars by the toilet and in the tub and shower. Do not use towel bars as grab bars.  Use non-skid mats or decals in the tub or shower.  If you need to sit down in  the shower, use a plastic, non-slip stool.  Keep the floor dry. Clean up any water that spills on the floor as soon as it happens.  Remove soap buildup in the tub or shower regularly.  Attach bath mats securely with double-sided non-slip rug tape.  Do not have throw rugs and other things on the floor that can make you trip. What can I do in the bedroom?  Use night lights.  Make sure that you have a light by your bed that is easy to reach.  Do not use any sheets or blankets that are too big for your bed. They should not hang down onto the floor.  Have a firm chair that has side arms. You can use this for support while you get dressed.  Do not have throw rugs and other things on the floor that can make you trip. What can I do in the kitchen?  Clean up any spills right away.  Avoid walking on wet floors.  Keep items that you use a lot in easy-to-reach places.  If you need to reach something above you, use a strong step stool that has a grab  bar.  Keep electrical cords out of the way.  Do not use floor polish or wax that makes floors slippery. If you must use wax, use non-skid floor wax.  Do not have throw rugs and other things on the floor that can make you trip. What can I do with my stairs?  Do not leave any items on the stairs.  Make sure that there are handrails on both sides of the stairs and use them. Fix handrails that are broken or loose. Make sure that handrails are as long as the stairways.  Check any carpeting to make sure that it is firmly attached to the stairs. Fix any carpet that is loose or worn.  Avoid having throw rugs at the top or bottom of the stairs. If you do have throw rugs, attach them to the floor with carpet tape.  Make sure that you have a light switch at the top of the stairs and the bottom of the stairs. If you do not have them, ask someone to add them for you. What else can I do to help prevent falls?  Wear shoes that:  Do not have high  heels.  Have rubber bottoms.  Are comfortable and fit you well.  Are closed at the toe. Do not wear sandals.  If you use a stepladder:  Make sure that it is fully opened. Do not climb a closed stepladder.  Make sure that both sides of the stepladder are locked into place.  Ask someone to hold it for you, if possible.  Clearly mark and make sure that you can see:  Any grab bars or handrails.  First and last steps.  Where the edge of each step is.  Use tools that help you move around (mobility aids) if they are needed. These include:  Canes.  Walkers.  Scooters.  Crutches.  Turn on the lights when you go into a dark area. Replace any light bulbs as soon as they burn out.  Set up your furniture so you have a clear path. Avoid moving your furniture around.  If any of your floors are uneven, fix them.  If there are any pets around you, be aware of where they are.  Review your medicines with your doctor. Some medicines can make you feel dizzy. This can increase your chance of falling. Ask your doctor what other things that you can do to help prevent falls. This information is not intended to replace advice given to you by your health care provider. Make sure you discuss any questions you have with your health care provider. Document Released: 06/17/2009 Document Revised: 01/27/2016 Document Reviewed: 09/25/2014 Elsevier Interactive Patient Education  2017 [employer].

## 2017-10-22 NOTE — Progress Notes (Signed)
Subjective:   AITANNA HAUBNER is a 72 y.o. female who presents for Medicare Annual (Subsequent) preventive examination.  Last AWV-10/31/2016    Objective:     Vitals: BP 138/70 (BP Location: Right Arm, Patient Position: Sitting)   Pulse 75   Temp 98.1 F (36.7 C) (Oral)   Ht 4\' 9"  (1.448 m)   Wt 106 lb (48.1 kg)   SpO2 95%   BMI 22.94 kg/m   Body mass index is 22.94 kg/m.  Advanced Directives 10/22/2017 08/02/2017 04/02/2017 12/27/2016 10/31/2016 10/31/2016 06/20/2016  Does Patient Have a Medical Advance Directive? No No No No No No No  Would patient like information on creating a medical advance directive? Yes (MAU/Ambulatory/Procedural Areas - Information given) - No - Patient declined - - Yes (ED - Information included in AVS) -    Tobacco Social History   Tobacco Use  Smoking Status Never Smoker  Smokeless Tobacco Never Used     Counseling given: Not Answered   Clinical Intake:  Pre-visit preparation completed: No  Pain : 0-10 Pain Score: 7  Pain Type: Chronic pain Pain Location: Hip Pain Orientation: Lower Pain Descriptors / Indicators: Aching Pain Onset: More than a month ago Pain Frequency: Constant     Diabetes: No  How often do you need to have someone help you when you read instructions, pamphlets, or other written materials from your doctor or pharmacy?: 1 - Never What is the last grade level you completed in school?: 12th grade  Interpreter Needed?: No  Information entered by :: Tyson Dense, RN  Past Medical History:  Diagnosis Date  . Alopecia areata 02/16/2006  . Anxiety state, unspecified 2004  . Benign paroxysmal positional vertigo 04/01/03  . Cervicalgia 04/01/03  . Chronic rhinitis 09/01/05  . Corns and callosities   . Dermatophytosis of nail   . Diarrhea 04/15/2015  . Diverticulosis   . Dysphagia, unspecified(787.20) 2004  . Enthesopathy of ankle and tarsus, unspecified 03/31/96  . External hemorrhoids without mention of complication    . 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 Fibromyalgia   . Hypertension 2004  . Hypothyroidism   . Impacted cerumen    right ear  . Lumbago   . Myalgia and myositis, unspecified 03/01/05  . Nontoxic uninodular goiter   . Osteoarthrosis, unspecified whether generalized or localized, unspecified site 04/01/03  . Other left bundle branch block   . Rheumatoid arthritis(714.0) 10/05/2004   lumbar spondylosis. Lumbo-sacral anterolisthesis    . Senile osteoporosis 04/01/03  . Sprain of metatarsophalangeal (joint) of foot   . Thyrotoxicosis   . Thyrotoxicosis without mention of goiter or other cause, without mention of thyrotoxic crisis or storm   . Unspecified vitamin D deficiency   . Urinary frequency    Past Surgical History:  Procedure Laterality Date  . COLONOSCOPY    . COLONOSCOPY N/A 04/15/2015   Procedure: COLONOSCOPY;  Surgeon: Gatha Mayer, MD;  Location: [facility];  Service: Endoscopy;  Laterality: N/A;  . FOOT SURGERY Right 05/22/2014   Dr.Strom   . [place]   DR Thornhill   . TONSILLECTOMY AND ADENOIDECTOMY  1974  . TUBAL LIGATION  1973   Family History  Problem Relation Age of Onset  . Heart attack Father        age 45  . Heart disease Father    Social History   Socioeconomic History  . Marital status:  Married    Spouse name: None  . Number of children: None  . Years of education: None  . Highest education level: None  Social Needs  . Financial resource strain: Not hard at all  . Food insecurity - worry: Never true  . Food insecurity - inability: Never true  . Transportation needs - medical: No  . Transportation needs - non-medical: No  Occupational History  . None  Tobacco Use  . Smoking status: Never Smoker  . Smokeless tobacco: Never Used  Substance and Sexual Activity  . Alcohol use: No    Alcohol/week: 0.0 oz  . Drug use: No  . Sexual activity: Yes  Other Topics Concern  .  None  Social History Narrative  . None    Outpatient Encounter Medications as of 10/22/2017  Medication Sig  . acetaminophen (TYLENOL) 500 MG tablet Take 500 mg by mouth every 6 (six) hours as needed for pain. Take two tablets up to four times a day as needed  . amitriptyline (ELAVIL) 10 MG tablet TAKE 1 TABLET BY MOUTH AT BEDTIME TO  HELP  RELAX  MUSCLES  . Biotin 1000 MCG tablet Take 1 tablet (1 mg total) by mouth 2 (two) times daily.  Scarlette Shorts SURECLICK 50 MG/ML injection Inject 50 mg as directed once a week. Inject once weekly for arthritis  . fluticasone (FLONASE) 50 MCG/ACT nasal spray Place 1 spray into the nose daily.  . folic acid (FOLVITE) 1 MG tablet Take one tablet every morning.  . Glucosamine-Chondroit-Vit C-Mn (GLUCOSAMINE CHONDR 500 COMPLEX PO) Take 500 mg by mouth daily. Take 2 tablets daily for joint health.  . hydrochlorothiazide (MICROZIDE) 12.5 MG capsule TAKE 1 CAPSULE BY MOUTH ONCE DAILY FOR BLOOD PRESSURE  . losartan (COZAAR) 50 MG tablet Take 1 tablet (50 mg total) by mouth daily.  . Multiple Vitamin (MULTIVITAMIN) tablet Take 1 tablet by mouth daily. Take 1 tablet once a daily  . Omega-3 Fatty Acids (FISH OIL) 1000 MG CAPS Take 1,000 mg by mouth daily. Take one tablet once a daily for heart, joint and skin health.  . predniSONE (DELTASONE) 5 MG tablet TAKE 1 TABLET BY MOUTH WITH BREAKFAST  . traMADol (ULTRAM) 50 MG tablet 1 tab 4 times daily as needed for moderate pain and 2 tabs 4 times daily as needed for severe pain  . Zoster Vaccine Adjuvanted [facility]) injection Inject 0.5 mLs into the muscle once for 1 dose.  . [DISCONTINUED] Zoster Vaccine Adjuvanted [facility]) injection Inject 0.5 mLs into the muscle once.  . urea (CARMOL) 20 % cream Apply to callus daily  . [DISCONTINUED] triamcinolone ointment (KENALOG) 0.1 % Apply 1 application topically 2 (two) times daily. For 5-7 days   No facility-administered encounter medications on file as of 10/22/2017.      Activities of Daily Living In your present state of health, do you have any difficulty performing the following activities: 10/22/2017 10/31/2016  Hearing? N N  Vision? N N  Difficulty concentrating or making decisions? N N  Walking or climbing stairs? N Y  Comment - Back pain; pt has RA   Dressing or bathing? N N  Doing errands, shopping? N N  Comment - Pt still drives vehicle.   Preparing Food and eating ? N N  Using the Toilet? N N  In the past six months, have you accidently leaked urine? N Y  Comment - Pt wears pantyliners daily   Do you have problems with loss of bowel control? N N  Managing your Medications? N N  Managing your Finances? N Y  Comment - pt's husband Holiday representative or managing your Housekeeping? N N  Some recent data might be hidden    Patient Care Team: Gayland Curry, DO as PCP - General (Geriatric Medicine) Hennie Duos, MD as Consulting Physician (Rheumatology) Altheimer, Legrand Como, MD as Consulting Physician (Endocrinology) Newman Pies, MD as Consulting Physician (Neurosurgery) Clydell Hakim, MD as Consulting Physician (Anesthesiology) Rolla Flatten, MD as Consulting Physician (Diagnostic Radiology) Jolene Schimke, MD as Referring Physician (Dermatology) Lynnell Dike, OD as Consulting Physician (Optometry)    Assessment:   This is a routine wellness examination for Sibbie.  Exercise Activities and Dietary recommendations Current Exercise Habits: The patient does not participate in regular exercise at present, Exercise limited by: orthopedic condition(s)  Goals    None      Fall Risk Fall Risk  10/22/2017 08/20/2017 08/02/2017 06/25/2017 04/02/2017  Falls in the past year? Yes Yes Yes No No  Number falls in past yr: 1 1 1  - -  Comment - - tripped over dog's leash - -  Injury with Fall? No No No - -  Follow up - - - - -   Is the patient's home free of loose throw rugs in walkways, pet beds, electrical cords, etc?    yes      Grab bars in the bathroom? yes      Handrails on the stairs?   yes      Adequate lighting?   yes  Timed Get Up and Go performed: 26 seconds, fall risk  Depression Screen PHQ 2/9 Scores 10/22/2017 08/20/2017 06/25/2017 04/02/2017  PHQ - 2 Score 0 0 0 0     Cognitive Function MMSE - Mini Mental State Exam 10/22/2017 10/31/2016 10/31/2016  Orientation to time 5 5 5   Orientation to Place 5 5 5   Registration 3 3 -  Attention/ Calculation 5 5 -  Recall 2 2 -  Language- name 2 objects 2 2 -  Language- repeat 1 1 -  Language- follow 3 step command 3 2 -  Language- read & follow direction 1 1 -  Write a sentence 1 1 -  Copy design 1 1 -  Total score 29 28 -        Immunization History  Administered Date(s) Administered  . Influenza, High Dose Seasonal PF 06/25/2017  . Influenza-Unspecified 06/04/2013, 06/24/2014, 07/06/2015, 05/05/2016, 06/13/2016  . Pneumococcal Conjugate-13 10/06/2015  . Pneumococcal Polysaccharide-23 10/31/2016  . Pneumococcal-Unspecified 09/04/2006  . Td 09/04/2005  . Zoster 11/10/2008    Qualifies for Shingles Vaccine? Yes, precription sent to pharmacy  Screening Tests Health Maintenance  Topic Date Due  . Hepatitis C Screening  Apr 29, 1946  . TETANUS/TDAP  11/02/2017 (Originally 09/05/2015)  . MAMMOGRAM  02/14/2019  . COLONOSCOPY  04/14/2025  . INFLUENZA VACCINE  Completed  . DEXA SCAN  Completed  . PNA vac Low Risk Adult  Completed    Cancer Screenings: Lung: Low Dose CT Chest recommended if Age 43-80 years, 30 pack-year currently smoking OR have quit w/in 15years. Patient does not qualify. Breast:  Up to date on Mammogram? Yes   Up to date of Bone Density/Dexa? Yes Colorectal: up to date  Additional Screenings:  Hepatitis B/HIV/Syphillis:not indicated Hepatitis C Screening: declined, pt wants to think about it     Plan:    I have personally reviewed and addressed the Medicare Annual Wellness questionnaire and have noted the  following in  the patient's chart:  A. Medical and social history B. Use of alcohol, tobacco or illicit drugs  C. Current medications and supplements D. Functional ability and status E.  Nutritional status F.  Physical activity G. Advance directives H. List of other physicians I.  Hospitalizations, surgeries, and ER visits in previous 12 months J.  [place] to include hearing, vision, cognitive, depression L. Referrals and appointments - none  In addition, I have reviewed and discussed with patient certain preventive protocols, quality metrics, and best practice recommendations. A written personalized care plan for preventive services as well as general preventive health recommendations were provided to patient.  See attached scanned questionnaire for additional information.   Signed,   Tyson Dense, RN Nurse Health Advisor  Patient Concerns:None

## 2017-10-26 ENCOUNTER — Other Ambulatory Visit: Payer: Self-pay | Admitting: Neurosurgery

## 2017-10-26 ENCOUNTER — Other Ambulatory Visit: Payer: Self-pay

## 2017-10-26 ENCOUNTER — Other Ambulatory Visit: Payer: Self-pay | Admitting: Nurse Practitioner

## 2017-10-26 DIAGNOSIS — I10 Essential (primary) hypertension: Secondary | ICD-10-CM

## 2017-10-26 MED ORDER — LOSARTAN POTASSIUM 50 MG PO TABS: 50.0000 mg | ORAL_TABLET | Freq: Every day | ORAL | 1 refills | Status: DC

## 2017-10-29 ENCOUNTER — Encounter: Payer: Self-pay | Admitting: Cardiology

## 2017-10-29 ENCOUNTER — Ambulatory Visit: Payer: Medicare Other | Admitting: Internal Medicine

## 2017-10-29 ENCOUNTER — Other Ambulatory Visit: Payer: Self-pay | Admitting: Internal Medicine

## 2017-10-29 ENCOUNTER — Ambulatory Visit: Payer: Medicare Other | Admitting: Cardiology

## 2017-10-29 VITALS — BP 144/80 | HR 75 | Ht 59.0 in | Wt 105.8 lb

## 2017-10-29 DIAGNOSIS — M069 Rheumatoid arthritis, unspecified: Secondary | ICD-10-CM | POA: Diagnosis not present

## 2017-10-29 DIAGNOSIS — R0789 Other chest pain: Secondary | ICD-10-CM

## 2017-10-29 DIAGNOSIS — R9431 Abnormal electrocardiogram [ECG] [EKG]: Secondary | ICD-10-CM | POA: Diagnosis not present

## 2017-10-29 DIAGNOSIS — I10 Essential (primary) hypertension: Secondary | ICD-10-CM | POA: Diagnosis not present

## 2017-10-29 MED ORDER — LOSARTAN POTASSIUM 100 MG PO TABS: 100.0000 mg | ORAL_TABLET | Freq: Every day | ORAL | 3 refills | Status: DC

## 2017-10-29 MED ORDER — HYDROCHLOROTHIAZIDE 25 MG PO TABS: 25.0000 mg | ORAL_TABLET | Freq: Every day | ORAL | 3 refills | Status: DC

## 2017-10-29 NOTE — Progress Notes (Signed)
Cardiology Office Note:    Date:  10/29/2017   ID:  Joan [name], DOB [birth year], MRN [id number]  PCP:  Gayland Curry, DO  Cardiologist:  No primary care provider on file.   Referring MD: Lauree Chandler, NP    History of Present Illness:    Joan [name] is a 72 y.o. female here for evaluation of chest pain, abnormal EKG at the request of Dr. Hollace Kinnier.  She has been seeing Dr. Arnoldo Morale for back pain. BP has been high.  She is currently taking HCTZ and losartan 12.5/50.   No SOB, no syncope, no bleeding.   Non smoker.  Father had a myocardial infarction and died at age 71.  Sometimes feels heart beat faster. Hip and right leg pain, has to take 2 tramadol tablets before she can move in the morning. Occasional short, fleeting CP episodes.  It is hard for her to know whether or not this is secondary to her back pain or fibromyalgia.  She has had some chronic right ankle edema.  Severe right hip pain as well.  She has not had any prior heart issues.  No prior MIs.  No prior strokes.  Sometimes when she is under increased pain her blood pressure will increase.  Past Medical History:  Diagnosis Date  . Alopecia areata 02/16/2006  . Anxiety state, unspecified 2004  . Benign paroxysmal positional vertigo 04/01/03  . Cervicalgia 04/01/03  . Chronic rhinitis 09/01/05  . Corns and callosities   . Dermatophytosis of nail   . Diarrhea 04/15/2015  . Diverticulosis   . Dysphagia, unspecified(787.20) 2004  . Enthesopathy of ankle and tarsus, unspecified 03/31/96  . External hemorrhoids without mention of complication   . 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 Fibromyalgia   . Hypertension 2004  . Hypothyroidism   . Impacted cerumen    right ear  . Lumbago   . Myalgia and myositis, unspecified 03/01/05  . Nontoxic uninodular goiter   . Osteoarthrosis, unspecified whether generalized or localized, unspecified site 04/01/03  . Other left bundle branch block   . Rheumatoid arthritis(714.0)  10/05/2004   lumbar spondylosis. Lumbo-sacral anterolisthesis    . Senile osteoporosis 04/01/03  . Sprain of metatarsophalangeal (joint) of foot   . Thyrotoxicosis   . Thyrotoxicosis without mention of goiter or other cause, without mention of thyrotoxic crisis or storm   . Unspecified vitamin D deficiency   . Urinary frequency     Past Surgical History:  Procedure Laterality Date  . COLONOSCOPY    . COLONOSCOPY N/A 04/15/2015   Procedure: COLONOSCOPY;  Surgeon: Gatha Mayer, MD;  Location: [facility];  Service: Endoscopy;  Laterality: N/A;  . FOOT SURGERY Right 05/22/2014   Dr.Strom   . [place]   DR [place]   . TONSILLECTOMY AND ADENOIDECTOMY  1974  . TUBAL LIGATION  1973    Current Medications: Current Meds  Medication Sig  . acetaminophen (TYLENOL) 500 MG tablet Take 500 mg by mouth every 6 (six) hours as needed for pain. Take two tablets up to four times a day as needed  . amitriptyline (ELAVIL) 10 MG tablet TAKE 1 TABLET BY MOUTH AT BEDTIME TO  HELP  RELAX  MUSCLES  . Biotin 1000 MCG tablet Take 1 tablet (1 mg total) by mouth 2 (two) times daily.  Scarlette Shorts SURECLICK 50 MG/ML injection Inject 50 mg as directed  once a week. Inject once weekly for arthritis  . fluticasone (FLONASE) 50 MCG/ACT nasal spray Place 1 spray into the nose daily.  . folic acid (FOLVITE) 1 MG tablet Take one tablet every morning.  . Glucosamine-Chondroit-Vit C-Mn (GLUCOSAMINE CHONDR 500 COMPLEX PO) Take 500 mg by mouth daily. Take 2 tablets daily for joint health.  . lidocaine (LIDODERM) 5 % Place 1 patch onto the skin daily. Remove & Discard patch within 12 hours or as directed by MD  . losartan (COZAAR) 100 MG tablet Take 1 tablet (100 mg total) by mouth daily.  . Multiple Vitamin (MULTIVITAMIN) tablet Take 1 tablet by mouth daily. Take 1 tablet once a daily  . Omega-3 Fatty Acids (FISH OIL) 1000 MG CAPS Take 1,000 mg by mouth  daily. Take one tablet once a daily for heart, joint and skin health.  . predniSONE (DELTASONE) 5 MG tablet TAKE 1 TABLET BY MOUTH WITH BREAKFAST  . traMADol (ULTRAM) 50 MG tablet 1 tab 4 times daily as needed for moderate pain and 2 tabs 4 times daily as needed for severe pain  . urea (CARMOL) 20 % cream Apply to callus daily  . [DISCONTINUED] hydrochlorothiazide (MICROZIDE) 12.5 MG capsule TAKE 1 CAPSULE BY MOUTH ONCE DAILY FOR BLOOD PRESSURE  . [DISCONTINUED] losartan (COZAAR) 50 MG tablet Take 1 tablet (50 mg total) by mouth daily.     Allergies:   Arava [leflunomide]; Methotrexate derivatives; Neurontin [gabapentin]; and Penicillins   Social History   Socioeconomic History  . Marital status: Married    Spouse name: None  . Number of children: None  . Years of education: None  . Highest education level: None  Social Needs  . Financial resource strain: Not hard at all  . Food insecurity - worry: Never true  . Food insecurity - inability: Never true  . Transportation needs - medical: No  . Transportation needs - non-medical: No  Occupational History  . None  Tobacco Use  . Smoking status: Never Smoker  . Smokeless tobacco: Never Used  Substance and Sexual Activity  . Alcohol use: No    Alcohol/week: 0.0 oz  . Drug use: No  . Sexual activity: Yes  Other Topics Concern  . None  Social History Narrative  . None     Family History: The patient's family history includes Heart attack in her father; Heart disease in her father. He was 77.   ROS:   Please see the history of present illness.     All other systems reviewed and are negative.  EKGs/Labs/Other Studies Reviewed:    The following studies were reviewed today: Prior office notes, lab work, EKG reviewed  EKG:  EKG is not ordered today.  Prior EKG from 08/02/17 reviewed and shows sinus rhythm with left anterior fascicular block, nonspecific ST-T wave changes.  Recent Labs: 06/13/2017: ALT 13; Hemoglobin 13.0;  Platelets 306; TSH 6.99 08/20/2017: BUN 31; Creat 0.67; Potassium 4.1; Sodium 140  Recent Lipid Panel    Component Value Date/Time   CHOL 181 06/13/2017 0828   TRIG 81 06/13/2017 0828   HDL 84 06/13/2017 0828   CHOLHDL 2.2 06/13/2017 0828    Physical Exam:    VS:  BP (!) 144/80   Pulse 75   Ht 4\' 11"  (1.499 m)   Wt 105 lb 12.8 oz (48 kg)   SpO2 99%   BMI 21.37 kg/m     Wt Readings from Last 3 Encounters:  10/29/17 105 lb 12.8 oz (48 kg)  10/22/17 106 lb (48.1 kg)  10/22/17 106 lb (48.1 kg)     GEN: Thin, small in stature, well nourished, well developed in no acute distress HEENT: Normal NECK: No JVD; No carotid bruits LYMPHATICS: No lymphadenopathy CARDIAC: RRR, no murmurs, rubs, gallops RESPIRATORY:  Clear to auscultation without rales, wheezing or rhonchi  ABDOMEN: Soft, non-tender, non-distended MUSCULOSKELETAL: Minor ankle edema; No deformity  SKIN: Warm and dry NEUROLOGIC:  Alert and oriented x 3 PSYCHIATRIC:  Normal affect   ASSESSMENT:    1. Atypical chest pain   2. Essential hypertension   3. Abnormal electrocardiogram   4. Rheumatoid arthritis of hand, unspecified laterality, unspecified rheumatoid factor presence ([place])    PLAN:    In order of problems listed above:  Atypical chest pain -Given her advanced age, father with MI at age 36, rheumatoid arthritis, abnormal EKG showing nonspecific ST-T wave changes and occasional fleeting chest discomfort as well as potential upcoming back surgery, I will go ahead and proceed with pharmacologic stress test to make sure she is not high risk.  Continue with primary prevention.  LDL 81.  Essential hypertension - Mildly elevated.  Has been somewhat difficult to control especially in the setting of right hip and back pain.-I will go ahead and increase her HCTZ to 25 mg and her Losartan to 100 mg. -Dr. Joneen Caraway can continue to follow her lab work and her blood pressure.   Rheumatoid arthritis -Enbrel.  This condition  puts her at increased risk for coronary artery disease.  Back pain -Dr. Arnoldo Morale.  We will follow-up with study.  Medication Adjustments/Labs and Tests Ordered: Current medicines are reviewed at length with the patient today.  Concerns regarding medicines are outlined above.  Orders Placed This Encounter  Procedures  . Myocardial Perfusion Imaging   Meds ordered this encounter  Medications  . losartan (COZAAR) 100 MG tablet    Sig: Take 1 tablet (100 mg total) by mouth daily.    Dispense:  90 tablet    Refill:  3  . hydrochlorothiazide (HYDRODIURIL) 25 MG tablet    Sig: Take 1 tablet (25 mg total) by mouth daily.    Dispense:  90 tablet    Refill:  3    Signed, Candee Furbish, MD  10/29/2017 9:23 AM    [facility]

## 2017-10-29 NOTE — Patient Instructions (Signed)
Medication Instructions:  Please increase your HTCZ to 25 mg a day. Increase Losartan to 100 mg a day. Continue all other medications as listed.  Testing/Procedures: Your physician has requested that you have a lexiscan myoview. For further information please visit [email or web address]. Please follow instruction sheet, as given.  Follow-Up: Follow up as needed with Dr Marlou Porch.  If you need a refill on your cardiac medications before your next appointment, please call your pharmacy.  Thank you for choosing [facility]!!

## 2017-10-30 ENCOUNTER — Telehealth (HOSPITAL_COMMUNITY): Payer: Self-pay | Admitting: *Deleted

## 2017-10-30 NOTE — Telephone Encounter (Signed)
Left message on voicemail in reference to upcoming appointment scheduled for 11/05/17. Phone number given for a call back so details instructions can be given. Callie Bunyard, Ranae Palms

## 2017-10-31 ENCOUNTER — Telehealth (HOSPITAL_COMMUNITY): Payer: Self-pay | Admitting: *Deleted

## 2017-10-31 NOTE — Telephone Encounter (Signed)
Patient given detailed instructions per Myocardial Perfusion Study Information Sheet for the test on 11/05/17. Patient notified to arrive 15 minutes early and that it is imperative to arrive on time for appointment to keep from having the test rescheduled.  If you need to cancel or reschedule your appointment, please call the office within 24 hours of your appointment. . Patient verbalized understanding. Kirstie Peri

## 2017-11-05 ENCOUNTER — Ambulatory Visit (HOSPITAL_COMMUNITY): Payer: Medicare Other | Attending: Cardiovascular Disease

## 2017-11-05 DIAGNOSIS — R079 Chest pain, unspecified: Secondary | ICD-10-CM | POA: Insufficient documentation

## 2017-11-05 DIAGNOSIS — I10 Essential (primary) hypertension: Secondary | ICD-10-CM | POA: Insufficient documentation

## 2017-11-05 DIAGNOSIS — R9431 Abnormal electrocardiogram [ECG] [EKG]: Secondary | ICD-10-CM | POA: Diagnosis not present

## 2017-11-05 LAB — MYOCARDIAL PERFUSION IMAGING
LHR: 0.17
LVDIAVOL: 57 mL (ref 46–106)
LVSYSVOL: 14 mL
NUC STRESS TID: 0.87
Peak HR: 94 {beats}/min
Rest HR: 68 {beats}/min
SDS: 2
SRS: 2
SSS: 4

## 2017-11-05 MED ORDER — TECHNETIUM TC 99M TETROFOSMIN IV KIT
10.3000 | PACK | Freq: Once | INTRAVENOUS | Status: AC | PRN
  Administered 2017-11-05: 10.3 via INTRAVENOUS
  Filled 2017-11-05: qty 11

## 2017-11-05 MED ORDER — TECHNETIUM TC 99M TETROFOSMIN IV KIT
31.7000 | PACK | Freq: Once | INTRAVENOUS | Status: AC | PRN
  Administered 2017-11-05: 31.7 via INTRAVENOUS
  Filled 2017-11-05: qty 32

## 2017-11-05 MED ORDER — REGADENOSON 0.4 MG/5ML IV SOLN
0.4000 mg | Freq: Once | INTRAVENOUS | Status: AC
  Administered 2017-11-05: 0.4 mg via INTRAVENOUS

## 2017-11-06 ENCOUNTER — Encounter: Payer: Self-pay | Admitting: Cardiology

## 2017-11-06 NOTE — Telephone Encounter (Signed)
This encounter was created in error - please disregard.

## 2017-11-06 NOTE — Telephone Encounter (Signed)
New Message   Patient is calling back in reference to stress test results. Please call to discuss.

## 2017-11-26 ENCOUNTER — Other Ambulatory Visit (HOSPITAL_COMMUNITY): Payer: Medicare Other

## 2017-11-30 NOTE — Pre-Procedure Instructions (Signed)
RUCHI STONEY  11/30/2017      [place], [place] [address] [place] [address] Phone: [phone] Fax: [phone]  [facility] [place], [place] [facility] [address] [place] [address] Phone: [phone] Fax: [phone]    Your procedure is scheduled on Wednesday April 10.  Report to [facility] Admitting at 6:30 A.M.  Call this number if you have problems the morning of surgery:  [phone]   Remember:  Do not eat food or drink liquids after midnight.  Take these medicines the morning of surgery with A SIP OF WATER:   Flonase if needed Acetaminophen (tylenol) if needed Tramadol (ultram) if needed Eye drops  7 days prior to surgery STOP taking any Aspirin(unless otherwise instructed by your surgeon), Aleve, Naproxen, Ibuprofen, Motrin, Advil, Goody's, BC's, all herbal medications, fish oil, and all vitamins    Do not wear jewelry, make-up or nail polish.  Do not wear lotions, powders, or perfumes, or deodorant.  Do not shave 48 hours prior to surgery.  Men may shave face and neck.  Do not bring valuables to the hospital.  [facility] is not responsible for any belongings or valuables.  Contacts, dentures or bridgework may not be worn into surgery.  Leave your suitcase in the car.  After surgery it may be brought to your room.  For patients admitted to the hospital, discharge time will be determined by your treatment team.  Patients discharged the day of surgery will not be allowed to drive home.   Special instructions:    [facility]- Preparing For Surgery  Before surgery, you can play an important role. Because skin is not sterile, your skin needs to be as free of germs as possible. You can reduce the number of germs on your skin by washing with CHG (chlorahexidine gluconate) Soap before surgery.  CHG is an antiseptic cleaner which kills germs and bonds  with the skin to continue killing germs even after washing.  Please do not use if you have an allergy to CHG or antibacterial soaps. If your skin becomes reddened/irritated stop using the CHG.  Do not shave (including legs and underarms) for at least 48 hours prior to first CHG shower. It is OK to shave your face.  Please follow these instructions carefully.   1. Shower the NIGHT BEFORE SURGERY and the MORNING OF SURGERY with CHG.   2. If you chose to wash your hair, wash your hair first as usual with your normal shampoo.  3. After you shampoo, rinse your hair and body thoroughly to remove the shampoo.  4. Use CHG as you would any other liquid soap. You can apply CHG directly to the skin and wash gently with a scrungie or a clean washcloth.   5. Apply the CHG Soap to your body ONLY FROM THE NECK DOWN.  Do not use on open wounds or open sores. Avoid contact with your eyes, ears, mouth and genitals (private parts). Wash Face and genitals (private parts)  with your normal soap.  6. Wash thoroughly, paying special attention to the area where your surgery will be performed.  7. Thoroughly rinse your body with warm water from the neck down.  8. DO NOT shower/wash with your normal soap after using and rinsing off the CHG Soap.  9. Pat yourself dry with a CLEAN TOWEL.  10. Wear CLEAN PAJAMAS to bed the night  before surgery, wear comfortable clothes the morning of surgery  11. Place CLEAN SHEETS on your bed the night of your first shower and DO NOT SLEEP WITH PETS.    Day of Surgery: Do not apply any deodorants/lotions. Please wear clean clothes to the hospital/surgery center.      Please read over the following fact sheets that you were given. Coughing and Deep Breathing, MRSA Information and Surgical Site Infection Prevention

## 2017-12-02 ENCOUNTER — Other Ambulatory Visit: Payer: Self-pay | Admitting: Internal Medicine

## 2017-12-02 DIAGNOSIS — M059 Rheumatoid arthritis with rheumatoid factor, unspecified: Secondary | ICD-10-CM

## 2017-12-03 ENCOUNTER — Encounter (HOSPITAL_COMMUNITY)
Admission: RE | Admit: 2017-12-03 | Discharge: 2017-12-03 | Disposition: A | Discharge status: Home / Self Care | Payer: Medicare Other | Source: Ambulatory Visit | Attending: Neurosurgery | Admitting: Neurosurgery

## 2017-12-03 ENCOUNTER — Encounter (HOSPITAL_COMMUNITY): Payer: Self-pay

## 2017-12-03 ENCOUNTER — Other Ambulatory Visit: Payer: Self-pay

## 2017-12-03 DIAGNOSIS — Z01818 Encounter for other preprocedural examination: Secondary | ICD-10-CM | POA: Diagnosis present

## 2017-12-03 DIAGNOSIS — M545 Low back pain: Secondary | ICD-10-CM | POA: Diagnosis not present

## 2017-12-03 HISTORY — DX: Malignant (primary) neoplasm, unspecified: C80.1

## 2017-12-03 LAB — TYPE AND SCREEN
ABO/RH(D): O NEG
Antibody Screen: NEGATIVE

## 2017-12-03 LAB — BASIC METABOLIC PANEL
ANION GAP: 9 (ref 5–15)
BUN: 29 mg/dL — ABNORMAL HIGH (ref 6–20)
CALCIUM: 8.9 mg/dL (ref 8.9–10.3)
CHLORIDE: 101 mmol/L (ref 101–111)
CO2: 29 mmol/L (ref 22–32)
CREATININE: 0.85 mg/dL (ref 0.44–1.00)
GFR calc Af Amer: >60 mL/min (ref >60)
GFR calc non Af Amer: >60 mL/min (ref >60)
Glucose, Bld: 96 mg/dL (ref 65–99)
Potassium: 4.2 mmol/L (ref 3.5–5.1)
SODIUM: 139 mmol/L (ref 135–145)

## 2017-12-03 LAB — CBC
HEMATOCRIT: 38.2 % (ref 36.0–46.0)
HEMOGLOBIN: 12 g/dL (ref 12.0–15.0)
MCH: 31 pg (ref 26.0–34.0)
MCHC: 31.4 g/dL (ref 30.0–36.0)
MCV: 98.7 fL (ref 78.0–100.0)
Platelets: 330 10*3/uL (ref 150–400)
RBC: 3.87 MIL/uL (ref 3.87–5.11)
RDW: 13.8 % (ref 11.5–15.5)
WBC: 12.8 10*3/uL — ABNORMAL HIGH (ref 4.0–10.5)

## 2017-12-03 LAB — ABO/RH: ABO/RH(D): O NEG

## 2017-12-03 LAB — SURGICAL PCR SCREEN
MRSA, PCR: NEGATIVE
STAPHYLOCOCCUS AUREUS: POSITIVE — AB

## 2017-12-09 ENCOUNTER — Other Ambulatory Visit: Payer: Self-pay | Admitting: Internal Medicine

## 2017-12-09 DIAGNOSIS — M059 Rheumatoid arthritis with rheumatoid factor, unspecified: Secondary | ICD-10-CM

## 2017-12-10 NOTE — Telephone Encounter (Signed)
Ok to fill? #[id number] last filled 10/29/17 Database checked and verified

## 2017-12-11 ENCOUNTER — Encounter (HOSPITAL_COMMUNITY): Payer: Self-pay | Admitting: Anesthesiology

## 2017-12-11 NOTE — Anesthesia Preprocedure Evaluation (Addendum)
Anesthesia Evaluation  Patient identified by MRN, date of birth, ID band Patient awake    Reviewed: Allergy & Precautions, NPO status , Patient's Chart, lab work & pertinent test results  Airway Mallampati: II     Mouth opening: Limited Mouth Opening  Dental no notable dental hx. (+) Teeth Intact   Pulmonary neg pulmonary ROS,    Pulmonary exam normal breath sounds clear to auscultation       Cardiovascular hypertension, Pt. on medications Normal cardiovascular exam Rhythm:Regular Rate:Normal     Neuro/Psych PSYCHIATRIC DISORDERS Anxiety Depression    GI/Hepatic negative GI ROS, Neg liver ROS,   Endo/Other    Renal/GU negative Renal ROS  negative genitourinary   Musculoskeletal   Abdominal Normal abdominal exam  (+)   Peds  Hematology negative hematology ROS (+)   Anesthesia Other Findings Joan [name]  GATED SPECT MYO PERF [facility] STRESS 1D  Order# [id number]  Reading physician: Sanda Klein, MD Ordering physician: Jerline Pain, MD Study date: 11/05/17 Patient Information   Name MRN Description Joan [name] [id number] 72 y.o. Female Result Notes for Myocardial Perfusion Imaging   Notes recorded by Stanton Kidney, RN on 11/06/2017 at 3:33 PM EST Informed patient of results and verbal understanding expressed.  ------  Notes recorded by Stanton Kidney, RN on 11/06/2017 at 3:31 PM EST Dairl Ponder L at 11/06/2017 3:16 PM  Status: Signed New Message  Patient is calling back in reference to stress test results. Please call to discuss.     ------  Notes recorded by Michaelyn Barter, RN on 11/06/2017 at 1:21 PM EST Left message for patient to call back. ------  Notes recorded by Jerline Pain, MD on 11/06/2017 at 1:17 PM EST Low risk stress nuclear study with normal perfusion and normal left ventricular regional and global systolic function. Candee Furbish, MD   Vitals    Height Weight BMI (Calculated) 4\' 11"  (1.499 m) 105 lb (47.6 kg) 21.2 Study Highlights     Nuclear stress EF: 75%.  The study is normal.  This is a low risk study.  The left ventricular ejection fraction is hyperdynamic (>65%).  There was no ST segment deviation noted during stress.  No T wave inversion was noted during stress.   Low risk     Reproductive/Obstetrics                            Anesthesia Physical Anesthesia Plan  ASA: II  Anesthesia Plan: General   Post-op Pain Management:    Induction: Intravenous  PONV Risk Score and Plan: 4 or greater and Ondansetron and Dexamethasone  Airway Management Planned: Oral ETT  Additional Equipment:   Intra-op Plan:   Post-operative Plan: Extubation in OR  Informed Consent: I have reviewed the patients History and Physical, chart, labs and discussed the procedure including the risks, benefits and alternatives for the proposed anesthesia with the patient or authorized representative who has indicated his/her understanding and acceptance.   Dental advisory given  Plan Discussed with: CRNA and Surgeon  Anesthesia Plan Comments:        Anesthesia Quick Evaluation

## 2017-12-12 ENCOUNTER — Inpatient Hospital Stay (HOSPITAL_COMMUNITY): Payer: Medicare Other | Admitting: Anesthesiology

## 2017-12-12 ENCOUNTER — Inpatient Hospital Stay (HOSPITAL_COMMUNITY): Payer: Medicare Other

## 2017-12-12 ENCOUNTER — Inpatient Hospital Stay (HOSPITAL_COMMUNITY)
Admission: RE | Admit: 2017-12-12 | Discharge: 2017-12-14 | DRG: 455 | Disposition: A | Discharge status: Home Health | Payer: Medicare Other | Source: Ambulatory Visit | Attending: Neurosurgery | Admitting: Neurosurgery

## 2017-12-12 ENCOUNTER — Inpatient Hospital Stay (HOSPITAL_COMMUNITY)
Admission: RE | Disposition: A | Discharge status: Home Health | Payer: Self-pay | Source: Ambulatory Visit | Attending: Neurosurgery

## 2017-12-12 ENCOUNTER — Encounter (HOSPITAL_COMMUNITY): Payer: Self-pay | Admitting: *Deleted

## 2017-12-12 DIAGNOSIS — M069 Rheumatoid arthritis, unspecified: Secondary | ICD-10-CM | POA: Diagnosis present

## 2017-12-12 DIAGNOSIS — M5117 Intervertebral disc disorders with radiculopathy, lumbosacral region: Principal | ICD-10-CM | POA: Diagnosis present

## 2017-12-12 DIAGNOSIS — I10 Essential (primary) hypertension: Secondary | ICD-10-CM | POA: Diagnosis present

## 2017-12-12 DIAGNOSIS — M419 Scoliosis, unspecified: Secondary | ICD-10-CM | POA: Diagnosis present

## 2017-12-12 DIAGNOSIS — Z419 Encounter for procedure for purposes other than remedying health state, unspecified: Secondary | ICD-10-CM

## 2017-12-12 DIAGNOSIS — M4317 Spondylolisthesis, lumbosacral region: Secondary | ICD-10-CM | POA: Diagnosis present

## 2017-12-12 DIAGNOSIS — E039 Hypothyroidism, unspecified: Secondary | ICD-10-CM | POA: Diagnosis not present

## 2017-12-12 DIAGNOSIS — M810 Age-related osteoporosis without current pathological fracture: Secondary | ICD-10-CM | POA: Diagnosis present

## 2017-12-12 DIAGNOSIS — F411 Generalized anxiety disorder: Secondary | ICD-10-CM | POA: Diagnosis not present

## 2017-12-12 DIAGNOSIS — M4807 Spinal stenosis, lumbosacral region: Secondary | ICD-10-CM | POA: Diagnosis present

## 2017-12-12 DIAGNOSIS — M545 Low back pain: Secondary | ICD-10-CM | POA: Diagnosis present

## 2017-12-12 DIAGNOSIS — M797 Fibromyalgia: Secondary | ICD-10-CM | POA: Diagnosis not present

## 2017-12-12 HISTORY — PX: LUMBAR DISC SURGERY: SHX700

## 2017-12-12 SURGERY — POSTERIOR LUMBAR FUSION 1 LEVEL
Anesthesia: General | Site: Spine Lumbar

## 2017-12-12 MED ORDER — VANCOMYCIN HCL 1 G IV SOLR
INTRAVENOUS | Status: DC | PRN
  Administered 2017-12-12: 1000 mg via TOPICAL

## 2017-12-12 MED ORDER — DEXTROSE 5 % IV SOLN
INTRAVENOUS | Status: DC | PRN
  Administered 2017-12-12: 40 ug/min via INTRAVENOUS

## 2017-12-12 MED ORDER — ACETAMINOPHEN 500 MG PO TABS
1000.0000 mg | ORAL_TABLET | Freq: Four times a day (QID) | ORAL | Status: AC
  Administered 2017-12-12 – 2017-12-13 (×3): 1000 mg via ORAL
  Filled 2017-12-12 (×3): qty 2

## 2017-12-12 MED ORDER — THROMBIN 5000 UNITS EX SOLR
CUTANEOUS | Status: AC
  Filled 2017-12-12: qty 5000

## 2017-12-12 MED ORDER — LACTATED RINGERS IV SOLN
INTRAVENOUS | Status: DC
  Administered 2017-12-12 (×2): via INTRAVENOUS

## 2017-12-12 MED ORDER — PROPOFOL 10 MG/ML IV BOLUS
INTRAVENOUS | Status: DC | PRN
  Administered 2017-12-12: 140 mg via INTRAVENOUS

## 2017-12-12 MED ORDER — ROCURONIUM BROMIDE 10 MG/ML (PF) SYRINGE
PREFILLED_SYRINGE | INTRAVENOUS | Status: AC
  Filled 2017-12-12: qty 5

## 2017-12-12 MED ORDER — MIDAZOLAM HCL 5 MG/5ML IJ SOLN
INTRAMUSCULAR | Status: DC | PRN
  Administered 2017-12-12: 2 mg via INTRAVENOUS

## 2017-12-12 MED ORDER — SODIUM CHLORIDE 0.9 % IV SOLN: 250.0000 mL | INTRAVENOUS | Status: DC

## 2017-12-12 MED ORDER — OXYCODONE HCL 5 MG PO TABS
5.0000 mg | ORAL_TABLET | ORAL | Status: DC | PRN
  Administered 2017-12-12 – 2017-12-13 (×4): 5 mg via ORAL
  Filled 2017-12-12 (×3): qty 1

## 2017-12-12 MED ORDER — ONDANSETRON HCL 4 MG/2ML IJ SOLN
INTRAMUSCULAR | Status: AC
  Filled 2017-12-12: qty 2

## 2017-12-12 MED ORDER — SUGAMMADEX SODIUM 500 MG/5ML IV SOLN
INTRAVENOUS | Status: AC
  Filled 2017-12-12: qty 5

## 2017-12-12 MED ORDER — SODIUM CHLORIDE 0.9% FLUSH: 3.0000 mL | Freq: Two times a day (BID) | INTRAVENOUS | Status: DC

## 2017-12-12 MED ORDER — PROPOFOL 10 MG/ML IV BOLUS
INTRAVENOUS | Status: AC
  Filled 2017-12-12: qty 20

## 2017-12-12 MED ORDER — DOCUSATE SODIUM 100 MG PO CAPS
100.0000 mg | ORAL_CAPSULE | Freq: Two times a day (BID) | ORAL | Status: DC
  Administered 2017-12-12 – 2017-12-14 (×5): 100 mg via ORAL
  Filled 2017-12-12 (×5): qty 1

## 2017-12-12 MED ORDER — BACITRACIN ZINC 500 UNIT/GM EX OINT
TOPICAL_OINTMENT | CUTANEOUS | Status: DC | PRN
  Administered 2017-12-12: 1 via TOPICAL

## 2017-12-12 MED ORDER — INSULIN ASPART 100 UNIT/ML ~~LOC~~ SOLN: 0.0000 [IU] | Freq: Every day | SUBCUTANEOUS | Status: DC

## 2017-12-12 MED ORDER — LIDOCAINE 2% (20 MG/ML) 5 ML SYRINGE
INTRAMUSCULAR | Status: AC
  Filled 2017-12-12: qty 5

## 2017-12-12 MED ORDER — PROMETHAZINE HCL 25 MG/ML IJ SOLN: 6.2500 mg | INTRAMUSCULAR | Status: DC | PRN

## 2017-12-12 MED ORDER — DEXAMETHASONE SODIUM PHOSPHATE 10 MG/ML IJ SOLN
INTRAMUSCULAR | Status: AC
  Filled 2017-12-12: qty 1

## 2017-12-12 MED ORDER — LOSARTAN POTASSIUM 50 MG PO TABS
100.0000 mg | ORAL_TABLET | Freq: Every day | ORAL | Status: DC
  Administered 2017-12-12 – 2017-12-14 (×3): 100 mg via ORAL
  Filled 2017-12-12 (×3): qty 2

## 2017-12-12 MED ORDER — HYDROCORTISONE NA SUCCINATE PF 100 MG IJ SOLR
50.0000 mg | Freq: Four times a day (QID) | INTRAMUSCULAR | Status: AC
  Administered 2017-12-12 – 2017-12-13 (×6): 50 mg via INTRAVENOUS
  Filled 2017-12-12 (×6): qty 1

## 2017-12-12 MED ORDER — CHLORHEXIDINE GLUCONATE CLOTH 2 % EX PADS: 6.0000 | MEDICATED_PAD | Freq: Once | CUTANEOUS | Status: DC

## 2017-12-12 MED ORDER — HYDROCHLOROTHIAZIDE 25 MG PO TABS
25.0000 mg | ORAL_TABLET | Freq: Every day | ORAL | Status: DC
  Administered 2017-12-12 – 2017-12-14 (×3): 25 mg via ORAL
  Filled 2017-12-12 (×3): qty 1

## 2017-12-12 MED ORDER — MIDAZOLAM HCL 2 MG/2ML IJ SOLN
INTRAMUSCULAR | Status: AC
  Filled 2017-12-12: qty 2

## 2017-12-12 MED ORDER — CYCLOSPORINE 0.05 % OP EMUL
1.0000 [drp] | Freq: Two times a day (BID) | OPHTHALMIC | Status: DC
  Administered 2017-12-12 – 2017-12-14 (×4): 1 [drp] via OPHTHALMIC
  Filled 2017-12-12 (×6): qty 1

## 2017-12-12 MED ORDER — BACITRACIN ZINC 500 UNIT/GM EX OINT
TOPICAL_OINTMENT | CUTANEOUS | Status: AC
  Filled 2017-12-12: qty 28.35

## 2017-12-12 MED ORDER — ACETAMINOPHEN 650 MG RE SUPP: 650.0000 mg | RECTAL | Status: DC | PRN

## 2017-12-12 MED ORDER — VANCOMYCIN HCL IN DEXTROSE 1-5 GM/200ML-% IV SOLN
1000.0000 mg | INTRAVENOUS | Status: AC
  Administered 2017-12-12: 1000 mg via INTRAVENOUS
  Filled 2017-12-12: qty 200

## 2017-12-12 MED ORDER — TRAMADOL HCL 50 MG PO TABS: 100.0000 mg | ORAL_TABLET | Freq: Four times a day (QID) | ORAL | Status: DC | PRN

## 2017-12-12 MED ORDER — AMITRIPTYLINE HCL 10 MG PO TABS
10.0000 mg | ORAL_TABLET | Freq: Every day | ORAL | Status: DC
  Administered 2017-12-12 – 2017-12-13 (×2): 10 mg via ORAL
  Filled 2017-12-12 (×3): qty 1

## 2017-12-12 MED ORDER — ACETAMINOPHEN 325 MG PO TABS
650.0000 mg | ORAL_TABLET | ORAL | Status: DC | PRN
  Administered 2017-12-13 – 2017-12-14 (×2): 650 mg via ORAL
  Filled 2017-12-12 (×2): qty 2

## 2017-12-12 MED ORDER — BUPIVACAINE-EPINEPHRINE (PF) 0.5% -1:200000 IJ SOLN
INTRAMUSCULAR | Status: DC | PRN
  Administered 2017-12-12: 10 mL

## 2017-12-12 MED ORDER — VANCOMYCIN HCL 1000 MG IV SOLR
INTRAVENOUS | Status: AC
  Filled 2017-12-12: qty 1000

## 2017-12-12 MED ORDER — OXYCODONE HCL 5 MG PO TABS
10.0000 mg | ORAL_TABLET | ORAL | Status: DC | PRN
Start: 2017-12-12 — End: 2017-12-14
  Administered 2017-12-13 – 2017-12-14 (×6): 10 mg via ORAL
  Filled 2017-12-12 (×7): qty 2

## 2017-12-12 MED ORDER — VANCOMYCIN HCL IN DEXTROSE 1-5 GM/200ML-% IV SOLN
INTRAVENOUS | Status: AC
  Filled 2017-12-12: qty 200

## 2017-12-12 MED ORDER — KETOROLAC TROMETHAMINE 15 MG/ML IJ SOLN: 15.0000 mg | Freq: Once | INTRAMUSCULAR | Status: DC

## 2017-12-12 MED ORDER — SUGAMMADEX SODIUM 200 MG/2ML IV SOLN
INTRAVENOUS | Status: AC
  Filled 2017-12-12: qty 2

## 2017-12-12 MED ORDER — FENTANYL CITRATE (PF) 250 MCG/5ML IJ SOLN
INTRAMUSCULAR | Status: AC
  Filled 2017-12-12: qty 5

## 2017-12-12 MED ORDER — MENTHOL 3 MG MT LOZG: 1.0000 | LOZENGE | OROMUCOSAL | Status: DC | PRN

## 2017-12-12 MED ORDER — INSULIN ASPART 100 UNIT/ML ~~LOC~~ SOLN: 0.0000 [IU] | Freq: Three times a day (TID) | SUBCUTANEOUS | Status: DC

## 2017-12-12 MED ORDER — FLUTICASONE PROPIONATE 50 MCG/ACT NA SUSP
1.0000 | Freq: Every day | NASAL | Status: DC | PRN
  Filled 2017-12-12: qty 16

## 2017-12-12 MED ORDER — BUPIVACAINE LIPOSOME 1.3 % IJ SUSP
20.0000 mL | INTRAMUSCULAR | Status: AC
  Administered 2017-12-12: 20 mL
  Filled 2017-12-12: qty 20

## 2017-12-12 MED ORDER — DEXAMETHASONE SODIUM PHOSPHATE 4 MG/ML IJ SOLN
INTRAMUSCULAR | Status: DC | PRN
  Administered 2017-12-12: 10 mg via INTRAVENOUS

## 2017-12-12 MED ORDER — PHENYLEPHRINE HCL 10 MG/ML IJ SOLN
INTRAMUSCULAR | Status: DC | PRN
  Administered 2017-12-12 (×5): 80 ug via INTRAVENOUS

## 2017-12-12 MED ORDER — POLYVINYL ALCOHOL 1.4 % OP SOLN
1.0000 [drp] | Freq: Three times a day (TID) | OPHTHALMIC | Status: DC | PRN
  Filled 2017-12-12 (×2): qty 15

## 2017-12-12 MED ORDER — SODIUM CHLORIDE 0.9 % IV SOLN
INTRAVENOUS | Status: DC | PRN
  Administered 2017-12-12: 500 mL

## 2017-12-12 MED ORDER — PHENOL 1.4 % MT LIQD: 1.0000 | OROMUCOSAL | Status: DC | PRN

## 2017-12-12 MED ORDER — ONDANSETRON HCL 4 MG/2ML IJ SOLN: 4.0000 mg | Freq: Four times a day (QID) | INTRAMUSCULAR | Status: DC | PRN

## 2017-12-12 MED ORDER — THROMBIN (RECOMBINANT) 5000 UNITS EX SOLR
OROMUCOSAL | Status: DC | PRN
  Administered 2017-12-12 (×2): 5 mL via TOPICAL

## 2017-12-12 MED ORDER — ONDANSETRON HCL 4 MG/2ML IJ SOLN
INTRAMUSCULAR | Status: DC | PRN
  Administered 2017-12-12: 4 mg via INTRAVENOUS

## 2017-12-12 MED ORDER — ROCURONIUM BROMIDE 100 MG/10ML IV SOLN
INTRAVENOUS | Status: DC | PRN
  Administered 2017-12-12: 40 mg via INTRAVENOUS

## 2017-12-12 MED ORDER — FOLIC ACID 1 MG PO TABS
1.0000 mg | ORAL_TABLET | Freq: Every day | ORAL | Status: DC
  Administered 2017-12-12 – 2017-12-14 (×3): 1 mg via ORAL
  Filled 2017-12-12 (×3): qty 1

## 2017-12-12 MED ORDER — ONDANSETRON HCL 4 MG PO TABS: 4.0000 mg | ORAL_TABLET | Freq: Four times a day (QID) | ORAL | Status: DC | PRN

## 2017-12-12 MED ORDER — SUGAMMADEX SODIUM 200 MG/2ML IV SOLN
INTRAVENOUS | Status: DC | PRN
  Administered 2017-12-12: 150 mg via INTRAVENOUS

## 2017-12-12 MED ORDER — BISACODYL 10 MG RE SUPP: 10.0000 mg | Freq: Every day | RECTAL | Status: DC | PRN

## 2017-12-12 MED ORDER — FENTANYL CITRATE (PF) 100 MCG/2ML IJ SOLN
INTRAMUSCULAR | Status: DC | PRN
  Administered 2017-12-12 (×2): 50 ug via INTRAVENOUS
  Administered 2017-12-12: 25 ug via INTRAVENOUS
  Administered 2017-12-12 (×2): 50 ug via INTRAVENOUS
  Administered 2017-12-12: 25 ug via INTRAVENOUS
  Administered 2017-12-12: 100 ug via INTRAVENOUS

## 2017-12-12 MED ORDER — 0.9 % SODIUM CHLORIDE (POUR BTL) OPTIME
TOPICAL | Status: DC | PRN
  Administered 2017-12-12: 1000 mL

## 2017-12-12 MED ORDER — LIDOCAINE HCL (CARDIAC) 20 MG/ML IV SOLN
INTRAVENOUS | Status: DC | PRN
  Administered 2017-12-12: 60 mg via INTRAVENOUS

## 2017-12-12 MED ORDER — HYDROMORPHONE HCL 1 MG/ML IJ SOLN: 0.2500 mg | INTRAMUSCULAR | Status: DC | PRN

## 2017-12-12 MED ORDER — SODIUM CHLORIDE 0.9% FLUSH: 3.0000 mL | INTRAVENOUS | Status: DC | PRN

## 2017-12-12 MED ORDER — CYCLOBENZAPRINE HCL 10 MG PO TABS
10.0000 mg | ORAL_TABLET | Freq: Three times a day (TID) | ORAL | Status: DC | PRN
  Administered 2017-12-12: 10 mg via ORAL
  Filled 2017-12-12: qty 1

## 2017-12-12 MED ORDER — MORPHINE SULFATE (PF) 4 MG/ML IV SOLN: 4.0000 mg | INTRAVENOUS | Status: DC | PRN

## 2017-12-12 MED ORDER — VANCOMYCIN HCL IN DEXTROSE 750-5 MG/150ML-% IV SOLN
750.0000 mg | Freq: Once | INTRAVENOUS | Status: AC
  Administered 2017-12-12: 750 mg via INTRAVENOUS
  Filled 2017-12-12 (×2): qty 150

## 2017-12-12 MED ORDER — ZOLPIDEM TARTRATE 5 MG PO TABS: 5.0000 mg | ORAL_TABLET | Freq: Every evening | ORAL | Status: DC | PRN

## 2017-12-12 MED ORDER — MEPERIDINE HCL 50 MG/ML IJ SOLN: 6.2500 mg | INTRAMUSCULAR | Status: DC | PRN

## 2017-12-12 MED ORDER — PREDNISONE 5 MG PO TABS
5.0000 mg | ORAL_TABLET | Freq: Every day | ORAL | Status: DC
  Administered 2017-12-13 – 2017-12-14 (×2): 5 mg via ORAL
  Filled 2017-12-12 (×2): qty 1

## 2017-12-12 MED ORDER — BUPIVACAINE-EPINEPHRINE (PF) 0.5% -1:200000 IJ SOLN
INTRAMUSCULAR | Status: AC
  Filled 2017-12-12: qty 30

## 2017-12-12 MED ORDER — PHENYLEPHRINE 40 MCG/ML (10ML) SYRINGE FOR IV PUSH (FOR BLOOD PRESSURE SUPPORT)
PREFILLED_SYRINGE | INTRAVENOUS | Status: AC
  Filled 2017-12-12: qty 10

## 2017-12-12 SURGICAL SUPPLY — 70 items
APL SKNCLS STERI-STRIP NONHPOA (GAUZE/BANDAGES/DRESSINGS) ×1
BAG DECANTER FOR FLEXI CONT (MISCELLANEOUS) ×3 IMPLANT
BENZOIN TINCTURE PRP APPL 2/3 (GAUZE/BANDAGES/DRESSINGS) ×3 IMPLANT
BLADE CLIPPER SURG (BLADE) IMPLANT
BUR MATCHSTICK NEURO 3.0 LAGG (BURR) ×3 IMPLANT
BUR PRECISION FLUTE 6.0 (BURR) ×3 IMPLANT
CANISTER SUCT 3000ML PPV (MISCELLANEOUS) ×3 IMPLANT
CAP REVERE LOCKING (Cap) ×8 IMPLANT
CARTRIDGE OIL MAESTRO DRILL (MISCELLANEOUS) ×1 IMPLANT
CLOSURE WOUND 1/2 X4 (GAUZE/BANDAGES/DRESSINGS) ×1
CONT SPEC 4OZ CLIKSEAL STRL BL (MISCELLANEOUS) ×3 IMPLANT
COVER BACK TABLE 60X90IN (DRAPES) ×6 IMPLANT
DECANTER SPIKE VIAL GLASS SM (MISCELLANEOUS) IMPLANT
DIFFUSER DRILL AIR PNEUMATIC (MISCELLANEOUS) ×3 IMPLANT
DRAPE C-ARM 42X72 X-RAY (DRAPES) ×6 IMPLANT
DRAPE HALF SHEET 40X57 (DRAPES) ×5 IMPLANT
DRAPE LAPAROTOMY 100X72X124 (DRAPES) ×3 IMPLANT
DRAPE SURG 17X23 STRL (DRAPES) ×12 IMPLANT
DRSG OPSITE POSTOP 4X6 (GAUZE/BANDAGES/DRESSINGS) ×2 IMPLANT
ELECT BLADE 4.0 EZ CLEAN MEGAD (MISCELLANEOUS) ×3
ELECT REM PT RETURN 9FT ADLT (ELECTROSURGICAL) ×3
ELECTRODE BLDE 4.0 EZ CLN MEGD (MISCELLANEOUS) ×1 IMPLANT
ELECTRODE REM PT RTRN 9FT ADLT (ELECTROSURGICAL) ×1 IMPLANT
EVACUATOR 1/8 PVC DRAIN (DRAIN) IMPLANT
GAUZE SPONGE 4X4 12PLY STRL (GAUZE/BANDAGES/DRESSINGS) ×1 IMPLANT
GAUZE SPONGE 4X4 16PLY XRAY LF (GAUZE/BANDAGES/DRESSINGS) ×1 IMPLANT
GLOVE BIO SURGEON STRL SZ8 (GLOVE) ×8 IMPLANT
GLOVE BIO SURGEON STRL SZ8.5 (GLOVE) ×6 IMPLANT
GLOVE BIOGEL PI IND STRL 7.0 (GLOVE) IMPLANT
GLOVE BIOGEL PI IND STRL 7.5 (GLOVE) ×2 IMPLANT
GLOVE BIOGEL PI INDICATOR 7.0 (GLOVE) ×8
GLOVE BIOGEL PI INDICATOR 7.5 (GLOVE) ×4
GLOVE EXAM NITRILE LRG STRL (GLOVE) IMPLANT
GLOVE EXAM NITRILE XL STR (GLOVE) IMPLANT
GLOVE EXAM NITRILE XS STR PU (GLOVE) IMPLANT
GOWN STRL REUS W/ TWL LRG LVL3 (GOWN DISPOSABLE) IMPLANT
GOWN STRL REUS W/ TWL XL LVL3 (GOWN DISPOSABLE) ×2 IMPLANT
GOWN STRL REUS W/TWL 2XL LVL3 (GOWN DISPOSABLE) IMPLANT
GOWN STRL REUS W/TWL LRG LVL3 (GOWN DISPOSABLE)
GOWN STRL REUS W/TWL XL LVL3 (GOWN DISPOSABLE) ×12
HEMOSTAT POWDER KIT SURGIFOAM (HEMOSTASIS) ×5 IMPLANT
KIT BASIN OR (CUSTOM PROCEDURE TRAY) ×3 IMPLANT
KIT TURNOVER KIT B (KITS) ×3 IMPLANT
MILL MEDIUM DISP (BLADE) IMPLANT
NDL HYPO 21X1.5 SAFETY (NEEDLE) IMPLANT
NEEDLE HYPO 21X1.5 SAFETY (NEEDLE) ×3 IMPLANT
NEEDLE HYPO 22GX1.5 SAFETY (NEEDLE) ×3 IMPLANT
NS IRRIG 1000ML POUR BTL (IV SOLUTION) ×3 IMPLANT
OIL CARTRIDGE MAESTRO DRILL (MISCELLANEOUS) ×3
PACK LAMINECTOMY NEURO (CUSTOM PROCEDURE TRAY) ×3 IMPLANT
PAD ARMBOARD 7.5X6 YLW CONV (MISCELLANEOUS) ×13 IMPLANT
PATTIES SURGICAL .5 X1 (DISPOSABLE) ×2 IMPLANT
PATTIES SURGICAL 1X1 (DISPOSABLE) ×2 IMPLANT
ROD REVERE CURVED 6.35X35MM (Rod) ×4 IMPLANT
SCREW REVERE 6.35 6.5MMX45 (Screw) ×4 IMPLANT
SCREW REVERE 6.5X50MM (Screw) ×4 IMPLANT
SPACER ALTERA 10X31 8-12MM-8 (Spacer) ×3 IMPLANT
SPONGE LAP 4X18 X RAY DECT (DISPOSABLE) IMPLANT
SPONGE NEURO XRAY DETECT 1X3 (DISPOSABLE) IMPLANT
SPONGE SURGIFOAM ABS GEL 100 (HEMOSTASIS) IMPLANT
STRIP BIOACTIVE 20CC 25X100X8 (Miscellaneous) ×2 IMPLANT
STRIP CLOSURE SKIN 1/2X4 (GAUZE/BANDAGES/DRESSINGS) ×2 IMPLANT
SUT VIC AB 1 CT1 18XBRD ANBCTR (SUTURE) ×2 IMPLANT
SUT VIC AB 1 CT1 8-18 (SUTURE) ×6
SUT VIC AB 2-0 CP2 18 (SUTURE) ×6 IMPLANT
SYR 20CC LL (SYRINGE) ×2 IMPLANT
TOWEL GREEN STERILE (TOWEL DISPOSABLE) ×3 IMPLANT
TOWEL GREEN STERILE FF (TOWEL DISPOSABLE) ×3 IMPLANT
TRAY FOLEY W/METER SILVER 16FR (SET/KITS/TRAYS/PACK) ×3 IMPLANT
WATER STERILE IRR 1000ML POUR (IV SOLUTION) ×3 IMPLANT

## 2017-12-12 NOTE — Op Note (Signed)
Brief history: The patient is a 72 year old white female with a history of thoracic lumbar scoliosis, spinal listhesis, disc degeneration, etc. who has complained of chronic severe right leg pain.  She has failed medical management.  She was worked up with lumbar x-rays and a lumbar MRI which demonstrated a L5-S1 spinal listhesis with severe foraminal stenosis.  I discussed the various treatment options with the patient.  She has weighed the risks, benefits, and alternatives surgery and decided proceed with an L5-S1 decompression, instrumentation, and fusion.  Preoperative diagnosis: L5-S1 spondylolisthesis, degenerative disc disease, spinal stenosis compressing both the L5 and the S1 nerve roots; lumbago; lumbar radiculopathy  Postoperative diagnosis: The same  Procedure: L5-S1 bilateral laminotomy/foraminotomies to decompress the bilateral L5 and S1 nerve roots(the work required to do this was in addition to the work required to do the posterior lumbar interbody fusion because of the patient's spinal stenosis, facet arthropathy. Etc. requiring a wide decompression of the nerve roots.);  Right L5-S1 transforaminal lumbar interbody fusion with local morselized autograft bone and Kinnex graft extender; insertion of interbody prosthesis at L5-S1 (globus peek expandable interbody prosthesis); posterior nonsegmental instrumentation from L5 to S1 with globus titanium pedicle screws and rods; posterior lateral arthrodesis at L5-S1 bilaterally with local morselized autograft bone and Kinnex bone graft extender.  Surgeon: Dr. Earle Gell  Asst.: Dr. Sherley Bounds  Anesthesia: Gen. endotracheal  Estimated blood loss: 250 cc  Drains: None  Complications: None  Description of procedure: The patient was brought to the operating room by the anesthesia team. General endotracheal anesthesia was induced. The patient was turned to the prone position on the Wilson frame. The patient's lumbosacral region was then  prepared with Betadine scrub and Betadine solution. Sterile drapes were applied.  I then injected the area to be incised with Marcaine with epinephrine solution. I then used the scalpel to make a linear midline incision over the L5-S1 interspace. I then used electrocautery to perform a bilateral subperiosteal dissection exposing the spinous process and lamina of L5 and S1. We then obtained intraoperative radiograph to confirm our location. We then inserted the Verstrac retractor to provide exposure.  I began the decompression by using the high speed drill to perform laminotomies at 5 S1 bilaterally. We then used the Kerrison punches to widen the laminotomy and removed the ligamentum flavum at L5-S1 bilaterally. We used the Kerrison punches to remove the medial facets at L5-S1 bilaterally, we remove the entire facets at L5-S1 on the right. We performed wide foraminotomies about the bilateral L5 and S1 nerve roots completing the decompression.  We now turned our attention to the posterior lumbar interbody fusion. I used a scalpel to incise the intervertebral disc at L5-S1 bilaterally. I then performed a partial intervertebral discectomy at L5-S1 bilaterally using the pituitary forceps. We prepared the vertebral endplates at U1-L2 bilaterally for the fusion by removing the soft tissues with the curettes. We then used the trial spacers to pick the appropriate sized interbody prosthesis. We prefilled his prosthesis with a combination of local morselized autograft bone that we obtained during the decompression as well as Kinnex bone graft extender. We inserted the prefilled prosthesis into the interspace at L5-S1 from the right, we then expanded the prosthesis. There was a good snug fit of the prosthesis in the interspace. We then filled and the remainder of the intervertebral disc space with local morselized autograft bone and Kinnex. This completed the posterior lumbar interbody arthrodesis.  We now turned  attention to the instrumentation. Under  fluoroscopic guidance we cannulated the bilateral L5 and S1 pedicles with the bone probe. We then removed the bone probe. We then tapped the pedicle with a 5.5 millimeter tap. We then removed the tap. We probed inside the tapped pedicle with a ball probe to rule out cortical breaches. We then inserted a 6.5 x 45 and 50 millimeter pedicle screw into the L5 and S1 pedicles bilaterally under fluoroscopic guidance. We then palpated along the medial aspect of the pedicles to rule out cortical breaches. There were none. The nerve roots were not injured. We then connected the unilateral pedicle screws with a lordotic rod. We compressed the construct and secured the rod in place with the caps. We then tightened the caps appropriately. This completed the instrumentation from L5-S1 bilaterally.  We now turned our attention to the posterior lateral arthrodesis at L5-S1 bilaterally. We used the high-speed drill to decorticate the remainder of the facets, pars, transverse process at L5-S1 bilaterally. We then applied a combination of local morselized autograft bone and Kinnex bone graft extender over these decorticated posterior lateral structures. This completed the posterior lateral arthrodesis.  We then obtained hemostasis using bipolar electrocautery. We irrigated the wound out with bacitracin solution. We inspected the thecal sac and nerve roots and noted they were well decompressed. We then removed the retractor. We placed vancomycin powder in the wound. We reapproximated patient's thoracolumbar fascia with interrupted #1 Vicryl suture. We reapproximated patient's subcutaneous tissue with interrupted 2-0 Vicryl suture. The reapproximated patient's skin with Steri-Strips and benzoin. The wound was then coated with bacitracin ointment. A sterile dressing was applied. The drapes were removed. The patient was subsequently returned to the supine position where they were extubated by  the anesthesia team. He was then transported to the post anesthesia care unit in stable condition. All sponge instrument and needle counts were reportedly correct at the end of this case.

## 2017-12-12 NOTE — Transfer of Care (Signed)
Immediate Anesthesia Transfer of Care Note  Patient: Joan [name]  Procedure(s) Performed: POSTERIOR LUMBAR INTERBODY FUSION, INTERBODY PROSTHESIS, POSTERIOR INSTRUMENTATION AND FUSION LUMBAR FIVE SACRAL ONE (N/A Spine Lumbar)  Patient Location: PACU  Anesthesia Type:General  Level of Consciousness: awake and drowsy  Airway & Oxygen Therapy: Patient Spontanous Breathing and Patient connected to nasal cannula oxygen  Post-op Assessment: Report given to RN, Post -op Vital signs reviewed and stable and Patient moving all extremities  Post vital signs: Reviewed and stable  Last Vitals:  Vitals Value Taken Time  BP 136/64 12/12/2017 12:41 PM  Temp    Pulse 92 12/12/2017 12:48 PM  Resp 27 12/12/2017 12:48 PM  SpO2 100 % 12/12/2017 12:48 PM  Vitals shown include unvalidated device data.  Last Pain:  Vitals:   12/12/17 0704  TempSrc:   PainSc: 10-Worst pain ever      Patients Stated Pain Goal: 3 (03/04/15 0109)  Complications: No apparent anesthesia complications

## 2017-12-12 NOTE — Evaluation (Signed)
Physical Therapy Evaluation Patient Details Name: Joan [name] MRN: [id number] DOB: [birth year] Today's Date: 12/12/2017   History of Present Illness  Pt is a 72 y/o female s/p L5-S1 PLIF. PMH includes BPPV, skin cancer, HTN, and fibromyalgia.   Clinical Impression  Patient is s/p above surgery resulting in the deficits listed below (see PT Problem List). Pt slightly anxious about moving following surgery and required reassurance throughout session. Required min to min guard A for mobility with RW. Reviewed back precautions, DME recommendations, and walking program to perform at home. Patient will benefit from skilled PT to increase their independence and safety with mobility (while adhering to their precautions) to allow discharge to the venue listed below.     Follow Up Recommendations No PT follow up;Supervision for mobility/OOB    Equipment Recommendations  Rolling walker with 5" wheels(youth RW )    Recommendations for Other Services       Precautions / Restrictions Precautions Precautions: Back Precaution Booklet Issued: Yes (comment) Precaution Comments: Reviewed back precautions with pt.  Required Braces or Orthoses: Spinal Brace Spinal Brace: Lumbar corset;Applied in sitting position Restrictions Weight Bearing Restrictions: No      Mobility  Bed Mobility Overal bed mobility: Needs Assistance Bed Mobility: Rolling;Sidelying to Sit;Sit to Sidelying Rolling: Min assist Sidelying to sit: Min assist     Sit to sidelying: Min assist General bed mobility comments: Min A for assist with rolling and trunk elevation. Pt reports she also has fibromyalgia, so sensitive to touch throughout bed mobility. Required min A for LE lift assist for return to sidelying.   Transfers Overall transfer level: Needs assistance Equipment used: Rolling walker (2 wheeled) Transfers: Sit to/from Stand Sit to Stand: Min assist         General transfer comment: Min A for lift assist and  steadying assist.   Ambulation/Gait Ambulation/Gait assistance: Min guard Ambulation Distance (Feet): 150 Feet Assistive device: Rolling walker (2 wheeled) Gait Pattern/deviations: Step-through pattern;Decreased stride length;Trunk flexed Gait velocity: Decreased  Gait velocity interpretation: Below normal speed for age/gender General Gait Details: Slow, very cautious gait. Verbal cues to increase step length and for upright posture throughout gait. Educated about generalized walking program to perform at home.   Stairs            Wheelchair Mobility    Modified Rankin (Stroke Patients Only)       Balance Overall balance assessment: Needs assistance Sitting-balance support: No upper extremity supported;Feet supported Sitting balance-Leahy Scale: Good     Standing balance support: Bilateral upper extremity supported;During functional activity Standing balance-Leahy Scale: Poor Standing balance comment: Reliant on BUE support.                              Pertinent Vitals/Pain Pain Assessment: Faces Faces Pain Scale: Hurts even more Pain Location: back  Pain Descriptors / Indicators: Aching;Operative site guarding Pain Intervention(s): Limited activity within patient's tolerance;Monitored during session;Repositioned    Home Living Family/patient expects to be discharged to:: Private residence Living Arrangements: Spouse/significant other Available Help at Discharge: Family Type of Home: House Home Access: Stairs to enter Entrance Stairs-Rails: None Entrance Stairs-Number of Steps: 1 Home Layout: One level Home Equipment: Walker - standard;Cane - single point      Prior Function Level of Independence: Independent with assistive device(s)         Comments: Was having to use walker and cane for ambulation secondary to increased pain.  Hand Dominance        Extremity/Trunk Assessment   Upper Extremity Assessment Upper Extremity  Assessment: Defer to OT evaluation    Lower Extremity Assessment Lower Extremity Assessment: RLE deficits/detail;Generalized weakness RLE Deficits / Details: Reports she was still having RLE pain     Cervical / Trunk Assessment Cervical / Trunk Assessment: Other exceptions Cervical / Trunk Exceptions: s/p PLIF   Communication   Communication: No difficulties  Cognition Arousal/Alertness: Awake/alert Behavior During Therapy: Anxious Overall Cognitive Status: Within Functional Limits for tasks assessed                                 General Comments: Pt slightly anxious about moving and about making sure husband understood precautions as well.       General Comments General comments (skin integrity, edema, etc.): Pt's husband present during session.     Exercises     Assessment/Plan    PT Assessment Patient needs continued PT services  PT Problem List Decreased strength;Decreased mobility;Decreased balance;Decreased knowledge of use of DME;Decreased knowledge of precautions;Pain       PT Treatment Interventions DME instruction;Gait training;Stair training;Functional mobility training;Therapeutic activities;Therapeutic exercise;Balance training;Neuromuscular re-education;Patient/family education    PT Goals (Current goals can be found in the Care Plan section)  Acute Rehab PT Goals Patient Stated Goal: to go home  PT Goal Formulation: With patient Time For Goal Achievement: 12/26/17 Potential to Achieve Goals: Good    Frequency Min 5X/week   Barriers to discharge        Co-evaluation               AM-PAC PT "6 Clicks" Daily Activity  Outcome Measure Difficulty turning over in bed (including adjusting bedclothes, sheets and blankets)?: Unable Difficulty moving from lying on back to sitting on the side of the bed? : Unable Difficulty sitting down on and standing up from a chair with arms (e.g., wheelchair, bedside commode, etc,.)?: Unable Help  needed moving to and from a bed to chair (including a wheelchair)?: A Little Help needed walking in hospital room?: A Little Help needed climbing 3-5 steps with a railing? : A Lot 6 Click Score: 11    End of Session Equipment Utilized During Treatment: Gait belt;Back brace Activity Tolerance: Patient tolerated treatment well Patient left: in bed;with call bell/phone within reach;with family/visitor present Nurse Communication: Mobility status PT Visit Diagnosis: Other abnormalities of gait and mobility (R26.89);Pain;Unsteadiness on feet (R26.81) Pain - part of body: (back )    Time: 6226-3335 PT Time Calculation (min) (ACUTE ONLY): 26 min   Charges:   PT Evaluation $PT Eval Low Complexity: 1 Low PT Treatments $Gait Training: 8-22 mins   PT G Codes:        Leighton Ruff, PT, DPT  Acute Rehabilitation Services  Pager: [phone]   Rudean Hitt 12/12/2017, 4:38 PM

## 2017-12-12 NOTE — H&P (Signed)
Subjective: The patient is a 72 year old white female who has complained of back and right buttock and leg pain consistent with lumbosacral radiculopathy/neurogenic claudication.  She has failed medical management.  She was worked up with lumbar x-rays, lumbar MRI, etc.  This demonstrated a thoracolumbar scoliosis with L5-S1 spinal listhesis with severe foraminal stenosis.  I discussed the various treatment options with the patient and her husband including surgery.  She has weighed the risks, benefits, and alternatives of surgery and decided proceed with a L5-S1 decompression, instrumentation, and fusion.  Past Medical History:  Diagnosis Date  . Alopecia areata 02/16/2006  . Anxiety state, unspecified 2004  . Benign paroxysmal positional vertigo 04/01/03  . Cancer ([place])    skin   . Cervicalgia 04/01/03  . Chronic rhinitis 09/01/05  . Corns and callosities   . Dermatophytosis of nail   . Diarrhea 04/15/2015  . Diverticulosis   . Dysphagia, unspecified(787.20) 2004  . Enthesopathy of ankle and tarsus, unspecified 03/31/96  . External hemorrhoids without mention of complication   . 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 Fibromyalgia   . Hypertension 2004  . Hypothyroidism   . Impacted cerumen    right ear  . Lumbago   . Myalgia and myositis, unspecified 03/01/05  . Nontoxic uninodular goiter   . Osteoarthrosis, unspecified whether generalized or localized, unspecified site 04/01/03  . Other left bundle branch block   . Rheumatoid arthritis(714.0) 10/05/2004   lumbar spondylosis. Lumbo-sacral anterolisthesis    . Senile osteoporosis 04/01/03  . Sprain of metatarsophalangeal (joint) of foot   . Thyrotoxicosis   . Thyrotoxicosis without mention of goiter or other cause, without mention of thyrotoxic crisis or storm   . Unspecified vitamin D deficiency   . Urinary frequency     Past Surgical History:  Procedure Laterality Date  . COLONOSCOPY    . COLONOSCOPY N/A 04/15/2015   Procedure:  COLONOSCOPY;  Surgeon: Gatha Mayer, MD;  Location: [facility];  Service: Endoscopy;  Laterality: N/A;  . DG FINGERS, MULTIPLE LT HAND ([place] HX)    . FOOT SURGERY Bilateral 05/22/2014   Dr.Strom   . [place]   DR [place]   . TONSILLECTOMY AND ADENOIDECTOMY  1974  . TUBAL LIGATION  1973    Allergies  Allergen Reactions  . Arava [Leflunomide] Diarrhea, Nausea Only and Other (See Comments)    Loss appetite  . Penicillins Rash and Other (See Comments)    PATIENT HAS HAD A PCN REACTION WITH IMMEDIATE RASH, FACIAL/TONGUE/THROAT SWELLING, SOB, OR LIGHTHEADEDNESS WITH HYPOTENSION:  #  #  YES  #  #  Has patient had a PCN reaction causing severe rash involving mucus membranes or skin necrosis: no Has patient had a PCN reaction that required hospitalization: no   . Methotrexate Derivatives Other (See Comments)    Hair loss  . Neurontin [Gabapentin] Nausea And Vomiting    Social History   Tobacco Use  . Smoking status: Never Smoker  . Smokeless tobacco: Never Used  Substance Use Topics  . Alcohol use: No    Alcohol/week: 0.0 oz    Family History  Problem Relation Age of Onset  . Heart attack Father        age 40  . Heart disease Father    Prior to Admission medications   Medication Sig Start Date End Date Taking? Authorizing Provider  acetaminophen (TYLENOL) 500 MG tablet Take 1,000  mg by mouth every 6 (six) hours as needed for mild pain or moderate pain. Take two tablets up to four times a day as needed    Yes [provider]  amitriptyline (ELAVIL) 10 MG tablet TAKE 1 TABLET BY MOUTH AT BEDTIME TO  HELP  RELAX  MUSCLES 10/29/17  Yes Reed, Tiffany L, DO  Biotin 1000 MCG tablet Take 1 tablet (1 mg total) by mouth 2 (two) times daily. 06/25/17  Yes Reed, Tiffany L, DO  cycloSPORINE (RESTASIS) 0.05 % ophthalmic emulsion Place 1 drop into both eyes 2 (two) times daily.   Yes [provider]   ENBREL SURECLICK 50 MG/ML injection Inject 50 mg as directed once a week. Inject once weekly for arthritis 11/30/12  Yes [provider]  fluticasone (FLONASE) 50 MCG/ACT nasal spray Place 1 spray into the nose daily. Patient taking differently: Place 1 spray into both nostrils daily as needed for allergies. Place 1 spray into the nose daily as neededd 12/27/16  Yes Estill Dooms, MD  folic acid (FOLVITE) 1 MG tablet Take one tablet every morning. Patient taking differently: Take 1 mg by mouth daily. Take one tablet every morning. 12/27/16  Yes Estill Dooms, MD  Glucosamine-Chondroit-Vit C-Mn (GLUCOSAMINE CHONDR 500 COMPLEX PO) Take 2 tablets by mouth daily. Take 2 tablets daily for joint health.   Yes [provider]  hydrochlorothiazide (HYDRODIURIL) 25 MG tablet Take 1 tablet (25 mg total) by mouth daily. 10/29/17 01/27/18 Yes Jerline Pain, MD  lidocaine (LIDODERM) 5 % Place 1 patch onto the skin daily. Remove & Discard patch within 12 hours or as directed by MD 10/22/17  Yes Reed, Tiffany L, DO  losartan (COZAAR) 100 MG tablet Take 1 tablet (100 mg total) by mouth daily. 10/29/17  Yes Jerline Pain, MD  Menthol, Topical Analgesic, (ASPERCREME MAX ROLL-ON EX) Apply topically.   Yes [provider]  Multiple Vitamin (MULTIVITAMIN) tablet Take 1 tablet by mouth daily. Take 1 tablet once a daily   Yes [provider]  naproxen (NAPROSYN) 500 MG tablet Take 500 mg by mouth 2 (two) times daily with a meal.   Yes [provider]  Omega-3 Fatty Acids (FISH OIL) 1000 MG CAPS Take 1,000 mg by mouth daily. Take one tablet once a daily for heart, joint and skin health.   Yes [provider]  predniSONE (DELTASONE) 5 MG tablet TAKE 1 TABLET BY MOUTH WITH BREAKFAST 12/03/17  Yes Reed, Tiffany L, DO  Propylene Glycol (SYSTANE BALANCE) 0.6 % SOLN Place 1 drop into both eyes 3 (three) times daily as needed (dry eyes).   Yes [provider]  traMADol  (ULTRAM) 50 MG tablet TAKE 1 TABLET BY MOUTH 4 TIMES DAILY AS NEEDED FOR  MODERATE  PAIN, AND 2 TABS BY MOUTH 4 TIMES DAILY AS NEEDED FOR SEVERE PAIN 12/10/17  Yes Reed, Tiffany L, DO  urea (CARMOL) 20 % cream Apply to callus daily Patient taking differently: Apply 1 application topically daily. Apply to callus daily 11/07/16   Estill Dooms, MD     Review of Systems  Positive ROS: As above  All other systems have been reviewed and were otherwise negative with the exception of those mentioned in the HPI and as above.  Objective: Vital signs in last 24 hours: Temp:  [98.7 F (37.1 C)] 98.7 F (37.1 C) (04/10 0628) Resp:  [18] 18 (04/10 0628) BP: (179)/(65) 179/65 (04/10 0628) SpO2:  [98 %] 98 % (04/10  1761) Weight:  [48.1 kg (106 lb)] 48.1 kg (106 lb) (04/10 0628) Estimated body mass index is 21.05 kg/m as calculated from the following:   Height as of 12/03/17: 4' 11.5" (1.511 m).   Weight as of this encounter: 48.1 kg (106 lb).   General Appearance: Alert Head: Normocephalic, without obvious abnormality, atraumatic Eyes: PERRL, conjunctiva/corneas clear, EOM's intact,    Ears: Normal  Throat: Normal  Neck: Supple, Back: Scoliosis Lungs: Clear to auscultation bilaterally, respirations unlabored Heart: Regular rate and rhythm, no murmur, rub or gallop Abdomen: Soft, non-tender Extremities: Extremities normal, atraumatic, no cyanosis or edema Skin: unremarkable  NEUROLOGIC:   Mental status: alert and oriented,Motor Exam - grossly normal Sensory Exam - grossly normal Reflexes:  Coordination - grossly normal Gait - grossly normal Balance - grossly normal Cranial Nerves: I: smell Not tested  II: visual acuity  OS: Normal  OD: Normal   II: visual fields Full to confrontation  II: pupils Equal, round, reactive to light  III,VII: ptosis None  III,IV,VI: extraocular muscles  Full ROM  V: mastication Normal  V: facial light touch sensation  Normal  V,VII: corneal reflex   Present  VII: facial muscle function - upper  Normal  VII: facial muscle function - lower Normal  VIII: hearing Not tested  IX: soft palate elevation  Normal  IX,X: gag reflex Present  XI: trapezius strength  5/5  XI: sternocleidomastoid strength 5/5  XI: neck flexion strength  5/5  XII: tongue strength  Normal    Data Review Lab Results  Component Value Date   WBC 12.8 (H) 12/03/2017   HGB 12.0 12/03/2017   HCT 38.2 12/03/2017   MCV 98.7 12/03/2017   PLT 330 12/03/2017   Lab Results  Component Value Date   NA 139 12/03/2017   K 4.2 12/03/2017   CL 101 12/03/2017   CO2 29 12/03/2017   BUN 29 (H) 12/03/2017   CREATININE 0.85 12/03/2017   GLUCOSE 96 12/03/2017   No results found for: INR, PROTIME  Assessment/Plan: L5-S1 spondylolisthesis, spinal stenosis, lumbar radiculopathy, lumbago, thoracic lumbar scoliosis: I have discussed the situation with the patient and her husband.  I reviewed the imaging studies with him and pointed out the abnormalities.  We have discussed various treatment options including surgery.  I have described the surgical treatment option of an L5-S1 decompression, instrumentation, and fusion.  I have shown her surgical models.  I given them a surgical pamphlet.  We have discussed the risks, benefits, alternatives, expected postoperative course, and likelihood of achieving our goals with surgery.  I have answered all their questions.  She has decided to proceed with surgery.   Ophelia Charter 12/12/2017 7:22 AM

## 2017-12-12 NOTE — Progress Notes (Signed)
Pharmacy Antibiotic Note  Joan [name] is a 72 y.o. female admitted on 12/12/2017 for lumbar surgery. PCN allergy, Vancomycin 1gm IV given pre-op ~7am.  No drain. For one-dose of Vancomycin ~12 hrs post-op.  Pre-op labs from 4/1:  creatining 0.85, WBC 12.8 (on Prednisone 5 mg daily).  Plan:  Vancomycin 750 mg IV x 1 at 8pm tonight.  No follow-up needed.  Pharmacy signing off.  Height: 4' 11.5" (151.1 cm) Weight: 106 lb (48.1 kg) IBW/kg (Calculated) : 44.35  Temp (24hrs), Avg:99.1 F (37.3 C), Min:98.7 F (37.1 C), Max:99.3 F (37.4 C)  Estimated Creatinine Clearance: 43.2 mL/min (by C-G formula based on SCr of 0.85 mg/dL).    Allergies  Allergen Reactions  . Arava [Leflunomide] Diarrhea, Nausea Only and Other (See Comments)    Loss appetite  . Penicillins Rash and Other (See Comments)    PATIENT HAS HAD A PCN REACTION WITH IMMEDIATE RASH, FACIAL/TONGUE/THROAT SWELLING, SOB, OR LIGHTHEADEDNESS WITH HYPOTENSION:  #  #  YES  #  #  Has patient had a PCN reaction causing severe rash involving mucus membranes or skin necrosis: no Has patient had a PCN reaction that required hospitalization: no   . Methotrexate Derivatives Other (See Comments)    Hair loss  . Neurontin [Gabapentin] Nausea And Vomiting    Thank you for allowing pharmacy to be a part of this patient's care.  Arty Baumgartner, [place] Pager: [phone] 12/12/2017 4:15 PM

## 2017-12-12 NOTE — Anesthesia Procedure Notes (Signed)
Procedure Name: Intubation Date/Time: 12/12/2017 8:40 AM Performed by: Iyania Denne T, CRNA Pre-anesthesia Checklist: Patient identified, Emergency Drugs available, Suction available and Patient being monitored Patient Re-evaluated:Patient Re-evaluated prior to induction Oxygen Delivery Method: Circle system utilized Preoxygenation: Pre-oxygenation with 100% oxygen Induction Type: IV induction Ventilation: Mask ventilation without difficulty Laryngoscope Size: Miller and 2 Grade View: Grade II Tube type: Oral Tube size: 7.0 mm Number of attempts: 1 Airway Equipment and Method: Patient positioned with wedge pillow and Stylet Placement Confirmation: ETT inserted through vocal cords under direct vision,  positive ETCO2 and breath sounds checked- equal and bilateral Secured at: 21 cm Tube secured with: Tape Dental Injury: Teeth and Oropharynx as per pre-operative assessment

## 2017-12-12 NOTE — Progress Notes (Signed)
Subjective: The patient is alert and pleasant.  She is in no apparent distress.  She looks well.  Objective: Vital signs in last 24 hours: Temp:  [98.7 F (37.1 C)-99.3 F (37.4 C)] 99.3 F (37.4 C) (04/10 1423) Pulse Rate:  [90-96] 92 (04/10 1423) Resp:  [8-20] 17 (04/10 1423) BP: (128-179)/(64-72) 147/72 (04/10 1423) SpO2:  [94 %-100 %] 98 % (04/10 1423) Weight:  [48.1 kg (106 lb)] 48.1 kg (106 lb) (04/10 0628) Estimated body mass index is 21.05 kg/m as calculated from the following:   Height as of this encounter: 4' 11.5" (1.511 m).   Weight as of this encounter: 48.1 kg (106 lb).   Intake/Output from previous day: No intake/output data recorded. Intake/Output this shift: Total I/O In: 2060 [P.O.:360; I.V.:1700] Out: 8850 [Urine:1225; Blood:200]  Physical exam patient is alert and pleasant.  Her strength is grossly normal in her lower extremities.  Lab Results: No results for input(s): WBC, HGB, HCT, PLT in the last 72 hours. BMET No results for input(s): NA, K, CL, CO2, GLUCOSE, BUN, CREATININE, CALCIUM in the last 72 hours.  Studies/Results: Dg Lumbar Spine 2-3 Views  Result Date: 12/12/2017 CLINICAL DATA:  PLIF L5-S1 EXAM: LUMBAR SPINE - 2-3 VIEW; DG C-ARM 61-120 MIN COMPARISON:  MRI lumbar spine 09/03/2017, intraoperative lumbar spine radiograph 12/12/2017 FLUOROSCOPY TIME:  0 minutes 38 seconds Images obtained: 2 FINDINGS: Prior MRI labeled with 5 lumbar vertebra. BILATERAL pedicle screws identified at L5 and S1 with intervening disc prosthesis. Disc space narrowing L4-L5. Diffuse osseous demineralization. IMPRESSION: Intraoperative images during posterior fusion L5-S1. Electronically Signed   By: Lavonia Dana M.D.   On: 12/12/2017 12:43   Dg Lumbar Spine 1 View  Result Date: 12/12/2017 CLINICAL DATA:  L5-S1 PLIF EXAM: LUMBAR SPINE - 1 VIEW COMPARISON:  Lumbar spine series of July 20, 2017 FINDINGS: A single lateral radiograph timed at 9:16 a.m. on December 12, 2017  is reviewed. A metallic probe as well as tissue spreader device projected over the L5 spinous process. The tip of the probe projects approximately 3.4 cm posterior to the posterior margin of the L5 vertebral body. L4 and L5 appear fused across the disc space. IMPRESSION: Lateral localization radiograph of the lumbar spine with findings as described. Electronically Signed   By: David  Martinique M.D.   On: 12/12/2017 11:02   Dg C-arm 1-60 Min  Result Date: 12/12/2017 CLINICAL DATA:  PLIF L5-S1 EXAM: LUMBAR SPINE - 2-3 VIEW; DG C-ARM 61-120 MIN COMPARISON:  MRI lumbar spine 09/03/2017, intraoperative lumbar spine radiograph 12/12/2017 FLUOROSCOPY TIME:  0 minutes 38 seconds Images obtained: 2 FINDINGS: Prior MRI labeled with 5 lumbar vertebra. BILATERAL pedicle screws identified at L5 and S1 with intervening disc prosthesis. Disc space narrowing L4-L5. Diffuse osseous demineralization. IMPRESSION: Intraoperative images during posterior fusion L5-S1. Electronically Signed   By: Lavonia Dana M.D.   On: 12/12/2017 12:43    Assessment/Plan: The patient is doing well.  LOS: 0 days     Ophelia Charter 12/12/2017, 4:46 PM

## 2017-12-12 NOTE — Anesthesia Postprocedure Evaluation (Signed)
Anesthesia Post Note  Patient: Joan [name]  Procedure(s) Performed: POSTERIOR LUMBAR INTERBODY FUSION, INTERBODY PROSTHESIS, POSTERIOR INSTRUMENTATION AND FUSION LUMBAR FIVE SACRAL ONE (N/A Spine Lumbar)     Patient location during evaluation: PACU Anesthesia Type: General Level of consciousness: sedated Pain management: pain level controlled Vital Signs Assessment: post-procedure vital signs reviewed and stable Respiratory status: spontaneous breathing Cardiovascular status: stable Postop Assessment: no apparent nausea or vomiting Anesthetic complications: no    Last Vitals:  Vitals:   12/12/17 1240 12/12/17 1255  BP: 136/64 (!) 141/68  Pulse: 95 96  Resp:  (!) 8  Temp: 37.3 C   SpO2: 100% 100%    Last Pain:  Vitals:   12/12/17 1255  TempSrc:   PainSc: Asleep   Pain Goal: Patients Stated Pain Goal: 3 (12/12/17 1240)               [place]

## 2017-12-13 LAB — BASIC METABOLIC PANEL
ANION GAP: 10 (ref 5–15)
BUN: 19 mg/dL (ref 6–20)
CALCIUM: 8 mg/dL — AB (ref 8.9–10.3)
CO2: 26 mmol/L (ref 22–32)
Chloride: 101 mmol/L (ref 101–111)
Creatinine, Ser: 0.72 mg/dL (ref 0.44–1.00)
Glucose, Bld: 151 mg/dL — ABNORMAL HIGH (ref 65–99)
Potassium: 3.3 mmol/L — ABNORMAL LOW (ref 3.5–5.1)
SODIUM: 137 mmol/L (ref 135–145)

## 2017-12-13 LAB — CBC
HEMATOCRIT: 28 % — AB (ref 36.0–46.0)
Hemoglobin: 9.2 g/dL — ABNORMAL LOW (ref 12.0–15.0)
MCH: 31.7 pg (ref 26.0–34.0)
MCHC: 32.9 g/dL (ref 30.0–36.0)
MCV: 96.6 fL (ref 78.0–100.0)
PLATELETS: 246 10*3/uL (ref 150–400)
RBC: 2.9 MIL/uL — ABNORMAL LOW (ref 3.87–5.11)
RDW: 13.9 % (ref 11.5–15.5)
WBC: 19.5 10*3/uL — AB (ref 4.0–10.5)

## 2017-12-13 MED FILL — Sodium Chloride IV Soln 0.9%: INTRAVENOUS | Qty: 1000 | Status: AC

## 2017-12-13 MED FILL — Thrombin For Soln 5000 Unit: CUTANEOUS | Qty: 5000 | Status: AC

## 2017-12-13 MED FILL — Heparin Sodium (Porcine) Inj 1000 Unit/ML: INTRAMUSCULAR | Qty: 30 | Status: AC

## 2017-12-13 MED FILL — Gelatin Absorbable MT Powder: OROMUCOSAL | Qty: 1 | Status: AC

## 2017-12-13 NOTE — Progress Notes (Signed)
Occupational Therapy Evaluation Patient Details Name: Joan [name] MRN: [id number] DOB: March 05, 1946 Today's Date: 12/13/2017    History of Present Illness Pt is a 72 y/o female s/p L5-S1 PLIF. PMH includes BPPV, skin cancer, HTN, and fibromyalgia.    Clinical Impression   PTA pt was modified independent with ADL and mobility although pt states she began using a RW and required ore assistance for ADL due to increased pain. Pt with significant limitations from her RA which is impacting her ability to follow back precautions in order to complete her self care. Recommend follow up with HHOT to further address ADL and IADL tasks within home to increase pt's independence and reduce risk of falls. Will attempt to see today to assess use of AE to help with ADL tasks needed for safe DC home.     Follow Up Recommendations  Home health OT;Supervision - Intermittent    Equipment Recommendations  Other (comment)(AE)    Recommendations for Other Services       Precautions / Restrictions Precautions Precautions: Back Precaution Booklet Issued: Yes (comment) Precaution Comments: Reviewed back precautions with pt.  Required Braces or Orthoses: Spinal Brace Spinal Brace: Lumbar corset;Applied in sitting position(properly adjusted) Restrictions Weight Bearing Restrictions: No      Mobility Bed Mobility               General bed mobility comments: Pt EOB  Transfers Overall transfer level: Needs assistance Equipment used: Rolling walker (2 wheeled) Transfers: Sit to/from Stand Sit to Stand: Min guard         General transfer comment: vc for hand placement    Balance Overall balance assessment: Needs assistance Sitting-balance support: No upper extremity supported;Feet supported Sitting balance-Leahy Scale: Good     Standing balance support: Bilateral upper extremity supported;During functional activity Standing balance-Leahy Scale: Poor Standing balance comment: Reliant on  BUE support.                            ADL either performed or assessed with clinical judgement   ADL Overall ADL's : Needs assistance/impaired     Grooming: Set up;Supervision/safety;Standing   Upper Body Bathing: Supervision/ safety;Set up;Sitting   Lower Body Bathing: Moderate assistance;Sit to/from stand   Upper Body Dressing : Minimal assistance;Sitting Upper Body Dressing Details (indicate cue type and reason): brace adjusted to help increase pt's independence with brace management; may benefit from added loop strap on brace due to difficulty with pinch/grip strength Lower Body Dressing: Moderate assistance;Sit to/from stand Lower Body Dressing Details (indicate cue type and reason): unableto use reacher at this time due to poor grip strength; may benefit from use of dressing stick Toilet Transfer: Min guard;RW;Ambulation;BSC(over toilet)   Toileting- Clothing Manipulation and Hygiene: Moderate assistance;Sit to/from stand Toileting - Clothing Manipulation Details (indicate cue type and reason): unableto reach for pericare     Functional mobility during ADLs: Min guard;Rolling walker;Cueing for safety General ADL Comments: Pt anxious about not being able to complete he self care following back precautions and the limitations she has due to her arthritis     Vision         Perception     Praxis      Pertinent Vitals/Pain Pain Assessment: 0-10 Pain Score: 5  Pain Location: back  Pain Descriptors / Indicators: Aching;Operative site guarding Pain Intervention(s): Limited activity within patient's tolerance     Hand Dominance Right   Extremity/Trunk Assessment Upper Extremity Assessment Upper Extremity  Assessment: Overall WFL for tasks assessed(B hand limitations due to arthritic changes; limited pinch/g)   Lower Extremity Assessment Lower Extremity Assessment: Defer to PT evaluation RLE Deficits / Details: Reports she was still having RLE pain     Cervical / Trunk Assessment Cervical / Trunk Assessment: Other exceptions Cervical / Trunk Exceptions: s/p PLIF    Communication Communication Communication: No difficulties   Cognition Arousal/Alertness: Awake/alert Behavior During Therapy: Anxious Overall Cognitive Status: Within Functional Limits for tasks assessed                                     General Comments       Exercises     Shoulder Instructions      Home Living Family/patient expects to be discharged to:: Private residence Living Arrangements: Spouse/significant other Available Help at Discharge: Family Type of Home: House Home Access: Stairs to enter Technical brewer of Steps: 1 Entrance Stairs-Rails: None Home Layout: One level     Bathroom Shower/Tub: Teacher, early years/pre: Standard Bathroom Accessibility: (RW sideways)   Home Equipment: Walker - standard;Cane - single point   Additional Comments: Plans to borrow [facility] form friend      Prior Functioning/Environment Level of Independence: Independent with assistive device(s)        Comments: Was having to use walker and cane for ambulation secondary to increased pain. ; has difficulty at times with self care due to RA        OT Problem List: Decreased strength;Decreased range of motion;Impaired balance (sitting and/or standing);Decreased knowledge of use of DME or AE;Decreased knowledge of precautions;Impaired UE functional use;Pain      OT Treatment/Interventions: Self-care/ADL training;DME and/or AE instruction;Therapeutic activities;Patient/family education    OT Goals(Current goals can be found in the care plan section) Acute Rehab OT Goals Patient Stated Goal: to be able to take care of myself OT Goal Formulation: With patient Time For Goal Achievement: 12/27/17 Potential to Achieve Goals: Good  OT Frequency: Min 2X/week   Barriers to D/C:            Co-evaluation              AM-PAC  PT "6 Clicks" Daily Activity     Outcome Measure Help from another person eating meals?: None Help from another person taking care of personal grooming?: A Little Help from another person toileting, which includes using toliet, bedpan, or urinal?: A Little Help from another person bathing (including washing, rinsing, drying)?: A Little Help from another person to put on and taking off regular upper body clothing?: A Little Help from another person to put on and taking off regular lower body clothing?: None 6 Click Score: 20   End of Session Equipment Utilized During Treatment: Rolling walker;Back brace Nurse Communication: Mobility status;Need for lift equipment  Activity Tolerance: Patient tolerated treatment well Patient left: in bed;Other (comment)(sitting EOB)  OT Visit Diagnosis: Unsteadiness on feet (R26.81);Muscle weakness (generalized) (M62.81);Pain Pain - part of body: (back)                Time: 1779-3903 OT Time Calculation (min): 34 min Charges:  OT General Charges $OT Visit: 1 Visit OT Evaluation $OT Eval Moderate Complexity: 1 Mod OT Treatments $Self Care/Home Management : 8-22 mins G-Codes:     [facility], OT/L  [phone] 12/13/2017  Yonael Tulloch,HILLARY 12/13/2017, 9:05 AM

## 2017-12-13 NOTE — Progress Notes (Signed)
Physical Therapy Treatment Patient Details Name: Joan [name] MRN: [id number] DOB: 08/30/1946 Today's Date: 12/13/2017    History of Present Illness Pt is a 72 y/o female s/p L5-S1 PLIF. PMH includes BPPV, skin cancer, HTN, and fibromyalgia.     PT Comments    Pt progressing towards physical therapy goals. Was able to demonstrate transfers and ambulation with gross min guard assist. Up to mod assist provided for stair training. Pt currently having increased difficulty holding walker and performing ADL's due to her RA. Do feel pt would benefit from home health therapies to maximize functional independence in her home environment. Will continue to follow and progress as able per POC.    Follow Up Recommendations  Home health PT;Supervision for mobility/OOB     Equipment Recommendations  Rolling walker with 5" wheels(youth RW )    Recommendations for Other Services       Precautions / Restrictions Precautions Precautions: Back Precaution Booklet Issued: Yes (comment) Precaution Comments: Reviewed back precautions with pt.  Required Braces or Orthoses: Spinal Brace Spinal Brace: Lumbar corset(properly adjusted) Restrictions Weight Bearing Restrictions: No    Mobility  Bed Mobility               General bed mobility comments: Pt sitting up EOB when PT arrived.   Transfers Overall transfer level: Needs assistance Equipment used: Rolling walker (2 wheeled) Transfers: Sit to/from Stand Sit to Stand: Min guard         General transfer comment: VC's for hand placement on seated surface for safety. Pt was able to power-up to full stand without assistance, however hands-on guarding provided for safety throughout.   Ambulation/Gait Ambulation/Gait assistance: Min guard;Supervision Ambulation Distance (Feet): 200 Feet Assistive device: Rolling walker (2 wheeled) Gait Pattern/deviations: Step-through pattern;Decreased stride length;Trunk flexed Gait velocity: Decreased   Gait velocity interpretation: Below normal speed for age/gender General Gait Details: Slow and guarded. Pt was able to improve posture with cues but unable to maintain.    Stairs Stairs: Yes   Stair Management: No rails;One rail Left;Step to pattern Number of Stairs: 4(1 step x4 attempts) General stair comments: Pt was able to complete stair training with min guard assist while pt had LUE on railing. As pt does not have rails at home, attempted with HHA as well. Mod assist provided on the L side to power up each step.   Wheelchair Mobility    Modified Rankin (Stroke Patients Only)       Balance Overall balance assessment: Needs assistance Sitting-balance support: No upper extremity supported;Feet supported Sitting balance-Leahy Scale: Good     Standing balance support: Bilateral upper extremity supported;During functional activity Standing balance-Leahy Scale: Poor Standing balance comment: Reliant on BUE support.                             Cognition Arousal/Alertness: Awake/alert Behavior During Therapy: Anxious(at times) Overall Cognitive Status: Within Functional Limits for tasks assessed                                 General Comments: Pt slightly anxious about moving and about making sure husband understood precautions as well.       Exercises      General Comments        Pertinent Vitals/Pain Pain Assessment: Faces Pain Score: 5  Faces Pain Scale: Hurts little more Pain Location: back  Pain Descriptors /  Indicators: Aching;Operative site guarding Pain Intervention(s): Monitored during session    [place] expects to be discharged to:: Private residence Living Arrangements: Spouse/significant other Available Help at Discharge: Family Type of Home: House Home Access: Stairs to enter Entrance Stairs-Rails: None Home Layout: One level Home Equipment: Environmental consultant - standard;Cane - single point Additional Comments:  Plans to borrow [facility] form friend    Prior Function Level of Independence: Independent with assistive device(s)      Comments: Was having to use walker and cane for ambulation secondary to increased pain. ; has difficulty at times with self care due to RA   PT Goals (current goals can now be found in the care plan section) Acute Rehab PT Goals Patient Stated Goal: to be able to take care of myself PT Goal Formulation: With patient Time For Goal Achievement: 12/26/17 Potential to Achieve Goals: Good Progress towards PT goals: Progressing toward goals    Frequency    Min 5X/week      PT Plan Current plan remains appropriate    Co-evaluation              AM-PAC PT "6 Clicks" Daily Activity  Outcome Measure  Difficulty turning over in bed (including adjusting bedclothes, sheets and blankets)?: A Little Difficulty moving from lying on back to sitting on the side of the bed? : A Little Difficulty sitting down on and standing up from a chair with arms (e.g., wheelchair, bedside commode, etc,.)?: A Little Help needed moving to and from a bed to chair (including a wheelchair)?: A Little Help needed walking in hospital room?: A Little Help needed climbing 3-5 steps with a railing? : A Little 6 Click Score: 18    End of Session Equipment Utilized During Treatment: Gait belt;Back brace Activity Tolerance: Patient tolerated treatment well Patient left: in bed;with call bell/phone within reach;with family/visitor present Nurse Communication: Mobility status PT Visit Diagnosis: Other abnormalities of gait and mobility (R26.89);Pain;Unsteadiness on feet (R26.81) Pain - part of body: (back )     Time: 0093-8182 PT Time Calculation (min) (ACUTE ONLY): 30 min  Charges:  $Gait Training: 23-37 mins                    G Codes:       Rolinda Roan, PT, DPT Acute Rehabilitation Services Pager: [phone]    Thelma Comp 12/13/2017, 11:00 AM

## 2017-12-13 NOTE — Progress Notes (Signed)
Subjective: The patient is alert and pleasant.  She looks well.  Her back is appropriately sore.  Objective: Vital signs in last 24 hours: Temp:  [98.3 F (36.8 C)-100.3 F (37.9 C)] 98.9 F (37.2 C) (04/11 0720) Pulse Rate:  [79-98] 79 (04/11 0720) Resp:  [8-20] 16 (04/11 0720) BP: (115-147)/(43-72) 118/47 (04/11 0720) SpO2:  [93 %-100 %] 93 % (04/11 0720) Estimated body mass index is 21.05 kg/m as calculated from the following:   Height as of this encounter: 4' 11.5" (1.511 m).   Weight as of this encounter: 48.1 kg (106 lb).   Intake/Output from previous day: 04/10 0701 - 04/11 0700 In: 2060 [P.O.:360; I.V.:1700] Out: 2425 [Urine:1825; Stool:400; Blood:200] Intake/Output this shift: No intake/output data recorded.  Physical exam the patient is alert and pleasant.  Her strength is grossly normal in her lower extremities.  Lab Results: Recent Labs    12/13/17 0635  WBC 19.5*  HGB 9.2*  HCT 28.0*  PLT 246   BMET Recent Labs    12/13/17 0635  NA 137  K 3.3*  CL 101  CO2 26  GLUCOSE 151*  BUN 19  CREATININE 0.72  CALCIUM 8.0*    Studies/Results: Dg Lumbar Spine 2-3 Views  Result Date: 12/12/2017 CLINICAL DATA:  PLIF L5-S1 EXAM: LUMBAR SPINE - 2-3 VIEW; DG C-ARM 61-120 MIN COMPARISON:  MRI lumbar spine 09/03/2017, intraoperative lumbar spine radiograph 12/12/2017 FLUOROSCOPY TIME:  0 minutes 38 seconds Images obtained: 2 FINDINGS: Prior MRI labeled with 5 lumbar vertebra. BILATERAL pedicle screws identified at L5 and S1 with intervening disc prosthesis. Disc space narrowing L4-L5. Diffuse osseous demineralization. IMPRESSION: Intraoperative images during posterior fusion L5-S1. Electronically Signed   By: Lavonia Dana M.D.   On: 12/12/2017 12:43   Dg Lumbar Spine 1 View  Result Date: 12/12/2017 CLINICAL DATA:  L5-S1 PLIF EXAM: LUMBAR SPINE - 1 VIEW COMPARISON:  Lumbar spine series of July 20, 2017 FINDINGS: A single lateral radiograph timed at 9:16 a.m. on  December 12, 2017 is reviewed. A metallic probe as well as tissue spreader device projected over the L5 spinous process. The tip of the probe projects approximately 3.4 cm posterior to the posterior margin of the L5 vertebral body. L4 and L5 appear fused across the disc space. IMPRESSION: Lateral localization radiograph of the lumbar spine with findings as described. Electronically Signed   By: David  Martinique M.D.   On: 12/12/2017 11:02   Dg C-arm 1-60 Min  Result Date: 12/12/2017 CLINICAL DATA:  PLIF L5-S1 EXAM: LUMBAR SPINE - 2-3 VIEW; DG C-ARM 61-120 MIN COMPARISON:  MRI lumbar spine 09/03/2017, intraoperative lumbar spine radiograph 12/12/2017 FLUOROSCOPY TIME:  0 minutes 38 seconds Images obtained: 2 FINDINGS: Prior MRI labeled with 5 lumbar vertebra. BILATERAL pedicle screws identified at L5 and S1 with intervening disc prosthesis. Disc space narrowing L4-L5. Diffuse osseous demineralization. IMPRESSION: Intraoperative images during posterior fusion L5-S1. Electronically Signed   By: Lavonia Dana M.D.   On: 12/12/2017 12:43    Assessment/Plan: Postop day 1: The patient is doing well.  She will likely go home tomorrow with home PT and OT.  LOS: 1 day     Ophelia Charter 12/13/2017, 9:41 AM

## 2017-12-13 NOTE — Care Management Note (Signed)
Case Management Note  Patient Details  Name: Joan [name] MRN: [id number] Date of Birth: Jun 24, 1946  Subjective/Objective:   72 yr old female s/p L5-S1PLIF.                  Action/Plan: Case manager spoke with patient concerning discharge plan. Choice for Home Health Agency was offered. Referral for Home Health PT was called to Tonny Branch, Liaison for [facility]. Patient will have support at discharge.  Expected Discharge Date:   12/14/17               Expected Discharge Plan:  [place]  In-House Referral:  NA  Discharge planning Services  CM Consult  Post Acute Care Choice:  Durable Medical Equipment, Home Health Choice offered to:  Patient  DME Arranged:  [facility] DME Agency:  [place] Arranged:  PT, OT [facility] Agency:     Status of Service:     If discussed at [place] of Stay Meetings, dates discussed:    Additional Comments:  Ninfa Meeker, RN 12/13/2017, 3:11 PM

## 2017-12-14 MED ORDER — OXYCODONE HCL 5 MG PO TABS: 5.0000 mg | ORAL_TABLET | ORAL | 0 refills | Status: DC | PRN

## 2017-12-14 MED ORDER — CYCLOBENZAPRINE HCL 10 MG PO TABS: 10.0000 mg | ORAL_TABLET | Freq: Three times a day (TID) | ORAL | 0 refills | Status: DC | PRN

## 2017-12-14 MED ORDER — DOCUSATE SODIUM 100 MG PO CAPS: 100.0000 mg | ORAL_CAPSULE | Freq: Two times a day (BID) | ORAL | 0 refills | Status: DC

## 2017-12-14 NOTE — Progress Notes (Signed)
Patient alert and oriented, mae's well, voiding adequate amount of urine, swallowing without difficulty, no c/o pain at time of discharge. Patient discharged home with family. Script and discharged instructions given to patient. Patient and family stated understanding of instructions given. Patient has an appointment with Dr. Jenkins   

## 2017-12-14 NOTE — Progress Notes (Signed)
Physical Therapy Treatment Patient Details Name: Joan [name] MRN: [id number] DOB: 03-23-1946 Today's Date: 12/14/2017    History of Present Illness Pt is a 72 y/o female s/p L5-S1 PLIF. PMH includes BPPV, skin cancer, HTN, and fibromyalgia.     PT Comments    Pt progressing well towards physical therapy goals. Pt's husband was present during session and participated with stair training and guarding. Pt anxious throughout session regarding performing self-care tasks at home and making sure her husband was aware of how to help her at home. Overall pt is moving very well. I don't anticipate that pt will require much assist from husband at d/c. Will continue to follow and progress as able per POC.   Follow Up Recommendations  Home health PT;Supervision for mobility/OOB     Equipment Recommendations  Rolling walker with 5" wheels(youth RW )    Recommendations for Other Services       Precautions / Restrictions Precautions Precautions: Back Precaution Booklet Issued: No Precaution Comments: Reviewed back precautions Required Braces or Orthoses: Spinal Brace Spinal Brace: Lumbar corset;Applied in sitting position Restrictions Weight Bearing Restrictions: No    Mobility  Bed Mobility Overal bed mobility: Needs Assistance Bed Mobility: Rolling;Sidelying to Sit Rolling: Supervision Sidelying to sit: Supervision     Sit to sidelying: Min assist General bed mobility comments: HOB flat with bed rails lowered to simulate home environment. Cues for log roll technique. Pt required minimal assist for elevation of LE up into bed but otherwise was able to complete without assistance.   Transfers Overall transfer level: Needs assistance Equipment used: Rolling walker (2 wheeled) Transfers: Sit to/from Stand Sit to Stand: Supervision         General transfer comment: VC's for hand placement on seated surface for safety.   Ambulation/Gait Ambulation/Gait assistance: Min  guard;Supervision Ambulation Distance (Feet): 250 Feet Assistive device: Rolling walker (2 wheeled) Gait Pattern/deviations: Step-through pattern;Decreased stride length;Trunk flexed Gait velocity: Decreased  Gait velocity interpretation: 1.31 - 2.62 ft/sec, indicative of limited community ambulator General Gait Details: Slow and guarded. Pt was able to improve posture with cues but unable to maintain.    Stairs Stairs: Yes Stairs assistance: Min assist Stair Management: One rail Left;No rails;Step to pattern Number of Stairs: 4(1 step x4 attempts) General stair comments: Pt's husband present for education. Pt was able to power-up one step with HHA after x4 attempts. Required rail use initially with HHA on other side, however was able to progress to no rails and HHA x1 side to simulate home environment.    Wheelchair Mobility    Modified Rankin (Stroke Patients Only)       Balance Overall balance assessment: Needs assistance Sitting-balance support: Feet supported Sitting balance-Leahy Scale: Good     Standing balance support: No upper extremity supported;During functional activity Standing balance-Leahy Scale: Fair Standing balance comment: for static standing tasks                            Cognition Arousal/Alertness: Awake/alert Behavior During Therapy: WFL for tasks assessed/performed Overall Cognitive Status: Within Functional Limits for tasks assessed                                 General Comments: Pt slightly anxious about moving and about making sure husband understood precautions as well.       Exercises      General Comments  Pertinent Vitals/Pain Pain Assessment: Faces Faces Pain Scale: Hurts little more Pain Location: back  Pain Descriptors / Indicators: Aching;Sore Pain Intervention(s): Monitored during session    Home Living                      Prior Function            PT Goals (current goals  can now be found in the care plan section) Acute Rehab PT Goals Patient Stated Goal: home today PT Goal Formulation: With patient Time For Goal Achievement: 12/26/17 Potential to Achieve Goals: Good Progress towards PT goals: Progressing toward goals    Frequency    Min 5X/week      PT Plan Current plan remains appropriate    Co-evaluation              AM-PAC PT "6 Clicks" Daily Activity  Outcome Measure  Difficulty turning over in bed (including adjusting bedclothes, sheets and blankets)?: A Little Difficulty moving from lying on back to sitting on the side of the bed? : A Little Difficulty sitting down on and standing up from a chair with arms (e.g., wheelchair, bedside commode, etc,.)?: A Little Help needed moving to and from a bed to chair (including a wheelchair)?: A Little Help needed walking in hospital room?: A Little Help needed climbing 3-5 steps with a railing? : A Little 6 Click Score: 18    End of Session Equipment Utilized During Treatment: Gait belt;Back brace Activity Tolerance: Patient tolerated treatment well Patient left: in bed;with call bell/phone within reach;with family/visitor present Nurse Communication: Mobility status PT Visit Diagnosis: Other abnormalities of gait and mobility (R26.89);Pain;Unsteadiness on feet (R26.81) Pain - part of body: (back )     Time: 7001-7494 PT Time Calculation (min) (ACUTE ONLY): 24 min  Charges:  $Gait Training: 23-37 mins                    G Codes:       Joan [name], PT, DPT Acute Rehabilitation Services Pager: [phone]    Thelma Comp 12/14/2017, 12:45 PM

## 2017-12-14 NOTE — Progress Notes (Signed)
Occupational Therapy Treatment Patient Details Name: Joan [name] MRN: [id number] DOB: 1946-08-28 Today's Date: 12/14/2017    History of present illness Pt is a 72 y/o female s/p L5-S1 PLIF. PMH includes BPPV, skin cancer, HTN, and fibromyalgia.    OT comments  Pt making good progress this session; able to recall back precautions and maintains throughout session. Pt required min assist overall for UB/LB dressing; reports husband can assist initially. Requires supervision for toilet transfers and functional mobility with use of RW. D/c plan remains appropriate. Will continue to follow acutely.   Follow Up Recommendations  Home health OT;Supervision - Intermittent    Equipment Recommendations  None recommended by OT    Recommendations for Other Services      Precautions / Restrictions Precautions Precautions: Back Precaution Booklet Issued: No Precaution Comments: Reviewed back precautions Required Braces or Orthoses: Spinal Brace Spinal Brace: Lumbar corset;Applied in sitting position Restrictions Weight Bearing Restrictions: No       Mobility Bed Mobility Overal bed mobility: Needs Assistance Bed Mobility: Rolling;Sidelying to Sit Rolling: Supervision Sidelying to sit: Supervision       General bed mobility comments: HOB flat with use of bed rails. Cues for log roll technique but no physical assist required  Transfers Overall transfer level: Needs assistance Equipment used: Rolling walker (2 wheeled) Transfers: Sit to/from Stand Sit to Stand: Supervision         General transfer comment: Cues for hand placements, supervision for safety    Balance Overall balance assessment: Needs assistance Sitting-balance support: Feet supported Sitting balance-Leahy Scale: Good     Standing balance support: No upper extremity supported;During functional activity Standing balance-Leahy Scale: Fair Standing balance comment: for static standing tasks                            ADL either performed or assessed with clinical judgement   ADL Overall ADL's : Needs assistance/impaired     Grooming: Supervision/safety;Standing;Wash/dry hands           Upper Body Dressing : Minimal assistance;Sitting;Standing Upper Body Dressing Details (indicate cue type and reason): Assist for brace management and fastening bra Lower Body Dressing: Minimal assistance;Sit to/from stand Lower Body Dressing Details (indicate cue type and reason): Assist to start clothing over feet, pt able to manage pulling up. Reports husband can assist with this as needed Toilet Transfer: Supervision/safety;Ambulation;Comfort height toilet;RW   Toileting- Clothing Manipulation and Hygiene: Set up;Supervision/safety;Sit to/from stand Toileting - Clothing Manipulation Details (indicate cue type and reason): for peri care and clothing. Educated on use of toilet aide; pt reports husband already has one at home for her to use     Functional mobility during ADLs: Supervision/safety;Rolling walker       Vision       Perception     Praxis      Cognition Arousal/Alertness: Awake/alert Behavior During Therapy: WFL for tasks assessed/performed Overall Cognitive Status: Within Functional Limits for tasks assessed                                          Exercises     Shoulder Instructions       General Comments      Pertinent Vitals/ Pain       Pain Assessment: Faces Faces Pain Scale: Hurts little more Pain Location: back  Pain Descriptors / Indicators:  Aching;Sore Pain Intervention(s): Monitored during session;Repositioned  Home Living                                          Prior Functioning/Environment              Frequency  Min 2X/week        Progress Toward Goals  OT Goals(current goals can now be found in the care plan section)  Progress towards OT goals: Progressing toward goals  Acute Rehab OT  Goals Patient Stated Goal: home today OT Goal Formulation: With patient  Plan Discharge plan remains appropriate    Co-evaluation                 AM-PAC PT "6 Clicks" Daily Activity     Outcome Measure   Help from another person eating meals?: None Help from another person taking care of personal grooming?: A Little Help from another person toileting, which includes using toliet, bedpan, or urinal?: A Little Help from another person bathing (including washing, rinsing, drying)?: A Little Help from another person to put on and taking off regular upper body clothing?: A Little Help from another person to put on and taking off regular lower body clothing?: A Little 6 Click Score: 19    End of Session Equipment Utilized During Treatment: Rolling walker;Back brace  OT Visit Diagnosis: Unsteadiness on feet (R26.81);Muscle weakness (generalized) (M62.81);Pain Pain - part of body: (back)   Activity Tolerance Patient tolerated treatment well   Patient Left in chair;with call bell/phone within reach   Nurse Communication          Time: 4268-3419 OT Time Calculation (min): 23 min  Charges: OT General Charges $OT Visit: 1 Visit OT Treatments $Self Care/Home Management : 23-37 mins  Laylaa Guevarra A. Ulice Brilliant, M.S., OTR/L Pager: [place] 12/14/2017, 10:16 AM

## 2017-12-14 NOTE — Discharge Summary (Signed)
Physician Discharge Summary  Patient ID: Joan [name] MRN: [id number] DOB/AGE: 71-19-1947 71 y.o.  Admit date: 12/12/2017 Discharge date: 12/14/2017  Admission Diagnoses: Thoracolumbar scoliosis, lumbosacral spondylolisthesis, lumbar spinal stenosis, lumbago, lumbar radiculopathy  Discharge Diagnoses: The same Active Problems:   Acquired spondylolisthesis of lumbosacral region   Discharged Condition: good  Hospital Course: I performed an L5-S1 decompression, instrumentation, and fusion on the patient on 12/12/2017.  The surgery went well.  The patient's postoperative course was unremarkable.  On postoperative day #2 she requested discharge home.  She was given written and oral discharge instructions.  All her questions were answered.  Arrangements were made for home physical therapy/Occupational Therapy.  Consults: Physical therapy, occupational therapy Significant Diagnostic Studies: None Treatments: L5-S1 decompression, instrumentation and fusion. Discharge Exam: Blood pressure 124/69, pulse 83, temperature 98.1 F (36.7 C), temperature source Oral, resp. rate 16, height 4' 11.5" (1.511 m), weight 48.1 kg (106 lb), SpO2 100 %. The patient is alert and pleasant.  She looks well.  Her strength is grossly normal.  Disposition: Home  Discharge Instructions    Call MD for:  difficulty breathing, headache or visual disturbances   Complete by:  As directed    Call MD for:  extreme fatigue   Complete by:  As directed    Call MD for:  hives   Complete by:  As directed    Call MD for:  persistant dizziness or light-headedness   Complete by:  As directed    Call MD for:  persistant nausea and vomiting   Complete by:  As directed    Call MD for:  redness, tenderness, or signs of infection (pain, swelling, redness, odor or green/yellow discharge around incision site)   Complete by:  As directed    Call MD for:  severe uncontrolled pain   Complete by:  As directed    Call MD for:   temperature >100.4   Complete by:  As directed    Diet - low sodium heart healthy   Complete by:  As directed    Discharge instructions   Complete by:  As directed    Call [phone] for a followup appointment. Take a stool softener while you are using pain medications.   Driving Restrictions   Complete by:  As directed    Do not drive for 2 weeks.   Increase activity slowly   Complete by:  As directed    Lifting restrictions   Complete by:  As directed    Do not lift more than 5 pounds. No excessive bending or twisting.   May shower / Bathe   Complete by:  As directed    Remove the dressing for 3 days after surgery.  You may shower, but leave the incision alone.   Remove dressing in 24 hours   Complete by:  As directed      Allergies as of 12/14/2017      Reactions   Arava [leflunomide] Diarrhea, Nausea Only, Other (See Comments)   Loss appetite   Penicillins Rash, Other (See Comments)   PATIENT HAS HAD A PCN REACTION WITH IMMEDIATE RASH, FACIAL/TONGUE/THROAT SWELLING, SOB, OR LIGHTHEADEDNESS WITH HYPOTENSION:  #  #  YES  #  #  Has patient had a PCN reaction causing severe rash involving mucus membranes or skin necrosis: no Has patient had a PCN reaction that required hospitalization: no   Methotrexate Derivatives Other (See Comments)   Hair loss   Neurontin [gabapentin] Nausea And Vomiting  Medication List    TAKE these medications   amitriptyline 10 MG tablet Commonly known as:  ELAVIL TAKE 1 TABLET BY MOUTH AT BEDTIME TO  HELP  RELAX  MUSCLES   ASPERCREME MAX ROLL-ON EX Apply topically.   Biotin 1000 MCG tablet Take 1 tablet (1 mg total) by mouth 2 (two) times daily.   cyclobenzaprine 10 MG tablet Commonly known as:  FLEXERIL Take 1 tablet (10 mg total) by mouth 3 (three) times daily as needed for muscle spasms.   cycloSPORINE 0.05 % ophthalmic emulsion Commonly known as:  RESTASIS Place 1 drop into both eyes 2 (two) times daily.   docusate sodium 100  MG capsule Commonly known as:  COLACE Take 1 capsule (100 mg total) by mouth 2 (two) times daily.   ENBREL SURECLICK 50 MG/ML injection Generic drug:  etanercept Inject 50 mg as directed once a week. Inject once weekly for arthritis   Fish Oil 1000 MG Caps Take 1,000 mg by mouth daily. Take one tablet once a daily for heart, joint and skin health.   fluticasone 50 MCG/ACT nasal spray Commonly known as:  FLONASE Place 1 spray into the nose daily. What changed:    how much to take  how to take this  when to take this  reasons to take this  additional instructions   folic acid 1 MG tablet Commonly known as:  FOLVITE Take one tablet every morning. What changed:    how much to take  how to take this  when to take this  additional instructions   GLUCOSAMINE CHONDR 500 COMPLEX PO Take 2 tablets by mouth daily. Take 2 tablets daily for joint health.   hydrochlorothiazide 25 MG tablet Commonly known as:  HYDRODIURIL Take 1 tablet (25 mg total) by mouth daily.   lidocaine 5 % Commonly known as:  LIDODERM Place 1 patch onto the skin daily. Remove & Discard patch within 12 hours or as directed by MD   losartan 100 MG tablet Commonly known as:  COZAAR Take 1 tablet (100 mg total) by mouth daily.   multivitamin tablet Take 1 tablet by mouth daily. Take 1 tablet once a daily   naproxen 500 MG tablet Commonly known as:  NAPROSYN Take 500 mg by mouth 2 (two) times daily with a meal.   oxyCODONE 5 MG immediate release tablet Commonly known as:  Oxy IR/ROXICODONE Take 1 tablet (5 mg total) by mouth every 4 (four) hours as needed for moderate pain ((score 4 to 6)).   predniSONE 5 MG tablet Commonly known as:  DELTASONE TAKE 1 TABLET BY MOUTH WITH BREAKFAST   SYSTANE BALANCE 0.6 % Soln Generic drug:  Propylene Glycol Place 1 drop into both eyes 3 (three) times daily as needed (dry eyes).   traMADol 50 MG tablet Commonly known as:  ULTRAM TAKE 1 TABLET BY MOUTH 4  TIMES DAILY AS NEEDED FOR  MODERATE  PAIN, AND 2 TABS BY MOUTH 4 TIMES DAILY AS NEEDED FOR SEVERE PAIN   TYLENOL 500 MG tablet Generic drug:  acetaminophen Take 1,000 mg by mouth every 6 (six) hours as needed for mild pain or moderate pain. Take two tablets up to four times a day as needed   urea 20 % cream Commonly known as:  CARMOL Apply to callus daily What changed:    how much to take  how to take this  when to take this  additional instructions  Durable Medical Equipment  (From admission, onward)        Start     Ordered   12/13/17 1806  For home use only DME Walker rolling  Once    Comments:  Albertha Ghee  Question:  Patient needs a walker to treat with the following condition  Answer:  Status post spinal surgery   12/13/17 1806     Follow-up Information    [place] [place] Follow up.   Specialty:  [place] Why:  A representative from [facility] will contact you to arrange start date and time for your therapy. Contact information: [place] [address] [phone]           Signed: Ophelia Charter 12/14/2017, 7:00 AM

## 2017-12-30 ENCOUNTER — Encounter (HOSPITAL_COMMUNITY): Payer: Self-pay | Admitting: Neurosurgery

## 2017-12-30 NOTE — Addendum Note (Signed)
Addendum  created 12/30/17 2304 by Lyn Hollingshead, MD   Intraprocedure Event edited, Intraprocedure Staff edited

## 2018-01-15 ENCOUNTER — Other Ambulatory Visit: Payer: Self-pay | Admitting: Internal Medicine

## 2018-01-15 ENCOUNTER — Telehealth: Payer: Self-pay | Admitting: *Deleted

## 2018-01-15 DIAGNOSIS — Z1231 Encounter for screening mammogram for malignant neoplasm of breast: Secondary | ICD-10-CM

## 2018-01-15 NOTE — Telephone Encounter (Signed)
Patient called and stated that she had surgery a month ago and now she is having swelling in her legs. No weeping. No pain. Surgeon told her just to keep her legs elevated. Patient wants fluid pill called into pharmacy. Informed patient we would need to evaluate and treat and offered her an appointment for today. Patient stated that she had errands to run and could not come in today. Offered an appointment for tomorrow and patient stated that she would just call back.

## 2018-02-11 ENCOUNTER — Other Ambulatory Visit: Payer: Medicare Other

## 2018-02-12 ENCOUNTER — Other Ambulatory Visit: Payer: Medicare Other

## 2018-02-12 LAB — COMPLETE METABOLIC PANEL WITH GFR
AG Ratio: 1.4 (calc) (ref 1.0–2.5)
ALT: 8 U/L (ref 6–29)
AST: 13 U/L (ref 10–35)
Albumin: 3.8 g/dL (ref 3.6–5.1)
Alkaline phosphatase (APISO): 95 U/L (ref 33–130)
BUN: 24 mg/dL (ref 7–25)
CO2: 31 mmol/L (ref 20–32)
Calcium: 8.5 mg/dL — ABNORMAL LOW (ref 8.6–10.4)
Chloride: 101 mmol/L (ref 98–110)
Creat: 0.6 mg/dL (ref 0.60–0.93)
GFR, Est African American: 107 mL/min/{1.73_m2} (ref >=60)
GFR, Est Non African American: 92 mL/min/{1.73_m2} (ref >=60)
Globulin: 2.8 g/dL (calc) (ref 1.9–3.7)
Glucose, Bld: 86 mg/dL (ref 65–99)
Potassium: 3.8 mmol/L (ref 3.5–5.3)
Sodium: 139 mmol/L (ref 135–146)
Total Bilirubin: 0.3 mg/dL (ref 0.2–1.2)
Total Protein: 6.6 g/dL (ref 6.1–8.1)

## 2018-02-12 LAB — CBC WITH DIFFERENTIAL/PLATELET
Basophils Absolute: 54 cells/uL (ref 0–200)
Basophils Relative: 0.5 %
Eosinophils Absolute: 203 cells/uL (ref 15–500)
Eosinophils Relative: 1.9 %
HCT: 32.1 % — ABNORMAL LOW (ref 35.0–45.0)
Hemoglobin: 10.5 g/dL — ABNORMAL LOW (ref 11.7–15.5)
Lymphs Abs: 2611 cells/uL (ref 850–3900)
MCH: 28.5 pg (ref 27.0–33.0)
MCHC: 32.7 g/dL (ref 32.0–36.0)
MCV: 87 fL (ref 80.0–100.0)
MPV: 10.4 fL (ref 7.5–12.5)
Monocytes Relative: 15 %
Neutro Abs: 6227 cells/uL (ref 1500–7800)
Neutrophils Relative %: 58.2 %
Platelets: 359 10*3/uL (ref 140–400)
RBC: 3.69 10*6/uL — ABNORMAL LOW (ref 3.80–5.10)
RDW: 11.7 % (ref 11.0–15.0)
Total Lymphocyte: 24.4 %
WBC mixed population: 1605 cells/uL — ABNORMAL HIGH (ref 200–950)
WBC: 10.7 10*3/uL (ref 3.8–10.8)

## 2018-02-12 LAB — LIPID PANEL
Cholesterol: 150 mg/dL (ref <200)
HDL: 59 mg/dL (ref >50)
LDL Cholesterol (Calc): 72 mg/dL (calc)
Non-HDL Cholesterol (Calc): 91 mg/dL (calc) (ref <130)
Total CHOL/HDL Ratio: 2.5 (calc) (ref <5.0)
Triglycerides: 111 mg/dL (ref <150)

## 2018-02-18 ENCOUNTER — Encounter: Payer: Self-pay | Admitting: Internal Medicine

## 2018-02-18 ENCOUNTER — Ambulatory Visit (INDEPENDENT_AMBULATORY_CARE_PROVIDER_SITE_OTHER): Payer: Medicare Other | Admitting: Internal Medicine

## 2018-02-18 ENCOUNTER — Ambulatory Visit: Payer: Medicare Other

## 2018-02-18 ENCOUNTER — Ambulatory Visit
Admission: RE | Admit: 2018-02-18 | Discharge: 2018-02-18 | Disposition: A | Discharge status: Home / Self Care | Payer: Medicare Other | Source: Ambulatory Visit | Attending: Internal Medicine | Admitting: Internal Medicine

## 2018-02-18 ENCOUNTER — Ambulatory Visit: Payer: Medicare Other | Admitting: Internal Medicine

## 2018-02-18 VITALS — BP 118/68 | HR 74 | Temp 98.1°F | Ht <= 58 in | Wt 108.0 lb

## 2018-02-18 DIAGNOSIS — M5387 Other specified dorsopathies, lumbosacral region: Secondary | ICD-10-CM | POA: Diagnosis not present

## 2018-02-18 DIAGNOSIS — M419 Scoliosis, unspecified: Secondary | ICD-10-CM

## 2018-02-18 DIAGNOSIS — M810 Age-related osteoporosis without current pathological fracture: Secondary | ICD-10-CM

## 2018-02-18 DIAGNOSIS — Z23 Encounter for immunization: Secondary | ICD-10-CM

## 2018-02-18 DIAGNOSIS — H6122 Impacted cerumen, left ear: Secondary | ICD-10-CM

## 2018-02-18 DIAGNOSIS — D692 Other nonthrombocytopenic purpura: Secondary | ICD-10-CM | POA: Diagnosis not present

## 2018-02-18 DIAGNOSIS — R600 Localized edema: Secondary | ICD-10-CM

## 2018-02-18 DIAGNOSIS — Z1231 Encounter for screening mammogram for malignant neoplasm of breast: Secondary | ICD-10-CM

## 2018-02-18 MED ORDER — ZOSTER VAC RECOMB ADJUVANTED 50 MCG/0.5ML IM SUSR: 0.5000 mL | Freq: Once | INTRAMUSCULAR | 1 refills | Status: AC

## 2018-02-18 MED ORDER — CALCIUM CARB-CHOLECALCIFEROL 600-800 MG-UNIT PO TABS: 1.0000 | ORAL_TABLET | Freq: Two times a day (BID) | ORAL | 11 refills | Status: AC

## 2018-02-18 MED ORDER — FUROSEMIDE 40 MG PO TABS: 40.0000 mg | ORAL_TABLET | Freq: Every day | ORAL | 0 refills | Status: DC

## 2018-02-18 NOTE — Progress Notes (Signed)
Location:  [facility] clinic Provider:  Eugene Isadore L. Mariea Clonts, D.O., C.M.D.  Code Status: full code Goals of Care:  Advanced Directives 12/03/2017  Does Patient Have a Medical Advance Directive? Yes  Type of Advance Directive [place] in Chart? No - copy requested  Would patient like information on creating a medical advance directive? -   Chief Complaint  Patient presents with  . Medical Management of Chronic Issues    15mh follow-up    HPI: Patient is a 72y.o. female seen today for medical management of chronic diseases.    She had back surgery 12/12/17 (posterior lumbar interbody fusion with Dr. JArnoldo Morale since we met .  She is now on tramadol bid.  She no longer requires oxycodone.  She no longer uses lidocaine patch or muscle relaxers.  She had one fall (but tripped over a stool 2 wks ago).  She called Dr. JArnoldo Morale office, but had no new symptoms related to her back after her fall.  She is still weaning off her back brace per instructions to do so over 2-3 months.  Her f/u visit was excellent (pre-fall).      She's asking about fish oil--used to take and wants to restart it.  Was off just for her surgery.    Her legs are swollen since her surgery.  She has elevated them.  It is not going away for good.  No shortness of breath.  Does not have the energy she did have.  She does not add salt to her food.     Right ear is stopped up again.  She's historically gotten a lot of wax in the ear and is concerned that this is again the case today.  Discussed shingles vaccine and she is interested--[place] there.    Past Medical History:  Diagnosis Date  . Alopecia areata 02/16/2006  . Anxiety state, unspecified 2004  . Benign paroxysmal positional vertigo 04/01/03  . Cancer [place]    skin   . Cervicalgia 04/01/03  . Chronic rhinitis 09/01/05  . Corns and callosities   .  Dermatophytosis of nail   . Diarrhea 04/15/2015  . Diverticulosis   . Dysphagia, unspecified(787.20) 2004  . Enthesopathy of ankle and tarsus, unspecified 03/31/96  . External hemorrhoids without mention of complication   . 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 Fibromyalgia   . Hypertension 2004  . Hypothyroidism   . Impacted cerumen    right ear  . Lumbago   . Myalgia and myositis, unspecified 03/01/05  . Nontoxic uninodular goiter   . Osteoarthrosis, unspecified whether generalized or localized, unspecified site 04/01/03  . Other left bundle branch block   . Rheumatoid arthritis(714.0) 10/05/2004   lumbar spondylosis. Lumbo-sacral anterolisthesis    . Senile osteoporosis 04/01/03  . Sprain of metatarsophalangeal (joint) of foot   . Thyrotoxicosis   . Thyrotoxicosis without mention of goiter or other cause, without mention of thyrotoxic crisis or storm   . Unspecified vitamin D deficiency   . Urinary frequency     Past Surgical History:  Procedure Laterality Date  . COLONOSCOPY    . COLONOSCOPY N/A 04/15/2015   Procedure: COLONOSCOPY;  Surgeon: CGatha Mayer MD;  [place]  Service: Endoscopy;  Laterality: N/A;  . DG FINGERS, MULTIPLE LT HAND ([place])    . FOOT SURGERY Bilateral 05/22/2014   Dr.Strom   .  [place]   DR [place]   . TONSILLECTOMY AND ADENOIDECTOMY  1974  . TUBAL LIGATION  1973    Allergies  Allergen Reactions  . Arava [Leflunomide] Diarrhea, Nausea Only and Other (See Comments)    Loss appetite  . Penicillins Rash and Other (See Comments)    PATIENT HAS HAD A PCN REACTION WITH IMMEDIATE RASH, FACIAL/TONGUE/THROAT SWELLING, SOB, OR LIGHTHEADEDNESS WITH HYPOTENSION:  #  #  YES  #  #  Has patient had a PCN reaction causing severe rash involving mucus membranes or skin necrosis: no Has patient had a PCN reaction that required hospitalization: no   . Methotrexate Derivatives  Other (See Comments)    Hair loss  . Neurontin [Gabapentin] Nausea And Vomiting    Outpatient Encounter Medications as of 02/18/2018  Medication Sig  . acetaminophen (TYLENOL) 500 MG tablet Take 1,000 mg by mouth as needed for mild pain or moderate pain.   Marland Kitchen amitriptyline (ELAVIL) 10 MG tablet TAKE 1 TABLET BY MOUTH AT BEDTIME TO  HELP  RELAX  MUSCLES  . Biotin 1000 MCG tablet Take 1 tablet (1 mg total) by mouth 2 (two) times daily.  . cycloSPORINE (RESTASIS) 0.05 % ophthalmic emulsion Place 1 drop into both eyes 2 (two) times daily.  Marland Kitchen docusate sodium (COLACE) 100 MG capsule Take 1 capsule (100 mg total) by mouth 2 (two) times daily.  Scarlette Shorts SURECLICK 50 MG/ML injection Inject 50 mg as directed once a week. Inject once weekly for arthritis  . fluticasone (FLONASE) 50 MCG/ACT nasal spray Place 1 spray into both nostrils daily.  . folic acid (FOLVITE) 1 MG tablet Take 1 mg by mouth daily.  . Glucosamine-Chondroit-Vit C-Mn (GLUCOSAMINE CHONDR 500 COMPLEX PO) Take 2 tablets by mouth daily.   Marland Kitchen losartan (COZAAR) 100 MG tablet Take 1 tablet (100 mg total) by mouth daily.  . Menthol, Topical Analgesic, (ASPERCREME MAX ROLL-ON EX) Apply topically.  . Multiple Vitamin (MULTIVITAMIN) tablet Take 1 tablet by mouth daily. Take 1 tablet once a daily  . Omega-3 Fatty Acids (FISH OIL) 1000 MG CAPS Take 1,000 mg by mouth daily.   . predniSONE (DELTASONE) 5 MG tablet TAKE 1 TABLET BY MOUTH WITH BREAKFAST  . Propylene Glycol (SYSTANE BALANCE) 0.6 % SOLN Place 1 drop into both eyes 3 (three) times daily as needed (dry eyes).  . traMADol (ULTRAM) 50 MG tablet Take by mouth 2 (two) times daily as needed.  . urea (CARMOL) 10 % cream Apply topically as needed.  . hydrochlorothiazide (HYDRODIURIL) 25 MG tablet Take 1 tablet (25 mg total) by mouth daily.  . [DISCONTINUED] cyclobenzaprine (FLEXERIL) 10 MG tablet Take 1 tablet (10 mg total) by mouth 3 (three) times daily as needed for muscle spasms.  .  [DISCONTINUED] fluticasone (FLONASE) 50 MCG/ACT nasal spray Place 1 spray into the nose daily. (Patient taking differently: Place 1 spray into both nostrils daily as needed for allergies. Place 1 spray into the nose daily as neededd)  . [DISCONTINUED] folic acid (FOLVITE) 1 MG tablet Take one tablet every morning. (Patient taking differently: Take 1 mg by mouth daily. Take one tablet every morning.)  . [DISCONTINUED] lidocaine (LIDODERM) 5 % Place 1 patch onto the skin daily. Remove & Discard patch within 12 hours or as directed by MD  . [DISCONTINUED] naproxen (NAPROSYN) 500 MG tablet Take 500 mg by mouth 2 (two) times daily with a meal.  . [  DISCONTINUED] oxyCODONE (OXY IR/ROXICODONE) 5 MG immediate release tablet Take 1 tablet (5 mg total) by mouth every 4 (four) hours as needed for moderate pain ((score 4 to 6)).  . [DISCONTINUED] traMADol (ULTRAM) 50 MG tablet TAKE 1 TABLET BY MOUTH 4 TIMES DAILY AS NEEDED FOR  MODERATE  PAIN, AND 2 TABS BY MOUTH 4 TIMES DAILY AS NEEDED FOR SEVERE PAIN  . [DISCONTINUED] urea (CARMOL) 20 % cream Apply to callus daily (Patient taking differently: Apply 1 application topically daily. Apply to callus daily)   No facility-administered encounter medications on file as of 02/18/2018.     Review of Systems:  Review of Systems  Constitutional: Positive for malaise/fatigue. Negative for chills and fever.       Less energy than pre-surgery, hgb better post transfusion  HENT: Negative for congestion.   Respiratory: Negative for cough and shortness of breath.   Cardiovascular: Positive for leg swelling. Negative for chest pain and palpitations.  Gastrointestinal: Negative for abdominal pain, blood in stool, constipation and melena.  Genitourinary: Negative for dysuria.  Musculoskeletal: Positive for back pain, falls and joint pain. Negative for myalgias and neck pain.  Skin:       Thin skin  Neurological: Negative for dizziness, tingling, sensory change and loss of  consciousness.  Endo/Heme/Allergies: Bruises/bleeds easily.  Psychiatric/Behavioral: Negative for depression and memory loss. The patient is not nervous/anxious and does not have insomnia.     Health Maintenance  Topic Date Due  . Hepatitis C Screening  1945/12/15  . TETANUS/TDAP  09/05/2015  . INFLUENZA VACCINE  04/04/2018  . MAMMOGRAM  02/14/2019  . COLONOSCOPY  04/14/2025  . DEXA SCAN  Completed  . PNA vac Low Risk Adult  Completed    Physical Exam: Vitals:   02/18/18 1147  BP: 118/68  Pulse: 74  Temp: 98.1 F (36.7 C)  TempSrc: Oral  SpO2: 96%  Weight: 108 lb (49 kg)  Height: 4' 10" (1.473 m)   Body mass index is 22.57 kg/m. Physical Exam  Constitutional: She is oriented to person, place, and time. She appears well-developed and well-nourished.  Cardiovascular: Normal rate, regular rhythm, normal heart sounds and intact distal pulses.  Pulmonary/Chest: Effort normal and breath sounds normal.  Abdominal: Bowel sounds are normal. She exhibits no distension. There is no tenderness.  Musculoskeletal: Normal range of motion.  Neurological: She is alert and oriented to person, place, and time.  Skin: Skin is warm and dry.  Psychiatric: She has a normal mood and affect.    Labs reviewed: Basic Metabolic Panel: Recent Labs    06/13/17 0828  12/03/17 0934 12/13/17 0635 02/12/18 0840  NA 141   < > 139 137 139  K 4.3   < > 4.2 3.3* 3.8  CL 102   < > 101 101 101  CO2 34*   < > _0 GLUCOSE 79   < > 96 151* 86  BUN 27*   < > 29* 19 24  CREATININE 0.73   < > 0.85 0.72 0.60  CALCIUM 9.0   < > 8.9 8.0* 8.5*  TSH 6.99*  --   --   --   --    < > = values in this interval not displayed.   Liver Function Tests: Recent Labs    06/13/17 0828 02/12/18 0840  AST 15 13  ALT 13 8  BILITOT 0.3 0.3  PROT 6.2 6.6   No results for input(s): LIPASE, AMYLASE in the last 8760 hours.  No results for input(s): AMMONIA in the last 8760 hours. CBC: Recent Labs     04/02/17 1417 06/13/17 0828 12/03/17 0934 12/13/17 0635 02/12/18 0840  WBC 10.0 12.1* 12.8* 19.5* 10.7  NEUTROABS 7,300 7,006  --   --  6,227  HGB 12.3 13.0 12.0 9.2* 10.5*  HCT 37.9 38.6 38.2 28.0* 32.1*  MCV 97.9 92.1 98.7 96.6 87.0  PLT 328 306 330 246 359   Lipid Panel: Recent Labs    06/13/17 0828 02/12/18 0840  CHOL 181 150  HDL 84 59  LDLCALC 81 72  TRIG 81 111  CHOLHDL 2.2 2.5   Assessment/Plan 1. Senile purpura (HCC) -ongoing, thin skin and ecchymoses from age and prednisone  2. Sciatica of right side associated with disorder of lumbosacral spine -better s/p lumbar surgery -down to just 2 tramadol per day, but also has RA managed by rheumatology  3. Scoliosis, unspecified scoliosis type, unspecified spinal region -ongoing, but back pain improved after surgery  4. Localized edema -since surgery -did have fluids and transfusion, elevating feet ineffective and already on hctz -will give just one week of lasix and encouraged a banana a day during that time -call me if edema persists after the week so refill can be provided and BMP checked for potassium level - furosemide (LASIX) 40 MG tablet; Take 1 tablet (40 mg total) by mouth daily.  Dispense: 7 tablet; Refill: 0  5. Need for shingles vaccine - Zoster Vaccine Adjuvanted (SHINGRIX) injection; Inject 0.5 mLs into the muscle once for 1 dose.  Dispense: 0.5 mL; Refill: 1--printed  6. Senile osteoporosis - encouraged weightbearing exercise, just started caltrate with D--advised her to take not just one but two per day to get adequate supplementation - Calcium Carb-Cholecalciferol (CALTRATE 600+D3) 600-800 MG-UNIT TABS; Take 1 tablet by mouth 2 (two) times daily.  Dispense: 60 tablet; Refill: 11 -bone density being checked by rheumatology  7. Hearing loss due to cerumen impaction, left - Ear Lavage performed today with warm water and peroxide flushing on left ear due to cerumen impaction, right ear was  clear  Labs/tests ordered:   Orders Placed This Encounter  Procedures  . Ear Lavage    Next appt:  07/08/2018 med mgt  Tiffany L. Reed, D.O. Geriatrics [facility] [address] [place], [place] [address] Cell Phone (Mon-Fri 8am-5pm):  [phone] On Call:  [phone] & follow prompts after 5pm & weekends Office Phone:  [phone] Office Fax:  [phone]   

## 2018-02-19 ENCOUNTER — Encounter: Payer: Self-pay | Admitting: *Deleted

## 2018-03-13 ENCOUNTER — Other Ambulatory Visit: Payer: Self-pay | Admitting: Internal Medicine

## 2018-03-13 DIAGNOSIS — M059 Rheumatoid arthritis with rheumatoid factor, unspecified: Secondary | ICD-10-CM

## 2018-04-30 ENCOUNTER — Other Ambulatory Visit: Payer: Self-pay | Admitting: Internal Medicine

## 2018-04-30 DIAGNOSIS — M059 Rheumatoid arthritis with rheumatoid factor, unspecified: Secondary | ICD-10-CM

## 2018-04-30 NOTE — Telephone Encounter (Signed)
Per the note from Dr. Mariea Clonts   "2. Sciatica of right side associated with disorder of lumbosacral spine -better s/p lumbar surgery -down to just 2 tramadol per day, but also has RA managed by rheumatology"   rx called into pharmacy

## 2018-05-02 ENCOUNTER — Telehealth: Payer: Self-pay | Admitting: *Deleted

## 2018-05-02 NOTE — Telephone Encounter (Signed)
Sent prior auth to Cover my meds on 05/01/2018, request came back today approved for tramadol.

## 2018-05-13 ENCOUNTER — Other Ambulatory Visit: Payer: Self-pay | Admitting: Internal Medicine

## 2018-06-06 ENCOUNTER — Other Ambulatory Visit: Payer: Self-pay | Admitting: Internal Medicine

## 2018-06-06 DIAGNOSIS — M059 Rheumatoid arthritis with rheumatoid factor, unspecified: Secondary | ICD-10-CM

## 2018-06-17 ENCOUNTER — Other Ambulatory Visit: Payer: Self-pay | Admitting: Internal Medicine

## 2018-06-18 ENCOUNTER — Telehealth: Payer: Self-pay | Admitting: Internal Medicine

## 2018-06-18 NOTE — Telephone Encounter (Signed)
Patient states she has been having diarrhea for about a week and wants to know if Dr.Perry would prescribe her the medication he used to, to stop it. Patient not sure what medication it was but she uses Product/process development scientist. Patient scheduled for ov on 11.13.19

## 2018-06-18 NOTE — Telephone Encounter (Signed)
I am not sure what medication she is referring to.  She has not been seen in over 2 years.  She has an appointment with Janett Billow next week.  Until then, 2 tablespoons of Metamucil and over-the-counter Imodium as needed would be reasonable

## 2018-06-18 NOTE — Telephone Encounter (Signed)
Spoke with pt and she is aware.

## 2018-06-18 NOTE — Telephone Encounter (Signed)
Left message for pt to call back  °

## 2018-06-18 NOTE — Telephone Encounter (Signed)
Not sure what medication she is talking about.  Please advise.  Thanks!

## 2018-06-24 ENCOUNTER — Ambulatory Visit: Payer: Medicare Other | Admitting: Gastroenterology

## 2018-06-24 ENCOUNTER — Telehealth: Payer: Self-pay | Admitting: Gastroenterology

## 2018-06-24 ENCOUNTER — Other Ambulatory Visit: Payer: Medicare Other

## 2018-06-24 ENCOUNTER — Telehealth: Payer: Self-pay

## 2018-06-24 ENCOUNTER — Encounter: Payer: Self-pay | Admitting: Gastroenterology

## 2018-06-24 VITALS — BP 90/50 | HR 70 | Ht 60.0 in | Wt 108.0 lb

## 2018-06-24 DIAGNOSIS — R197 Diarrhea, unspecified: Secondary | ICD-10-CM | POA: Diagnosis not present

## 2018-06-24 MED ORDER — DIPHENOXYLATE-ATROPINE 2.5-0.025 MG PO TABS: ORAL_TABLET | ORAL | 1 refills | Status: DC

## 2018-06-24 MED ORDER — RIFAXIMIN 550 MG PO TABS: 550.0000 mg | ORAL_TABLET | Freq: Three times a day (TID) | ORAL | 0 refills | Status: DC

## 2018-06-24 NOTE — Patient Instructions (Signed)
If you are age 71 or older, your body mass index should be between 23-30. Your Body mass index is 21.09 kg/m. If this is out of the aforementioned range listed, please consider follow up with your Primary Care Provider.  If you are age 75 or younger, your body mass index should be between 19-25. Your Body mass index is 21.09 kg/m. If this is out of the aformentioned range listed, please consider follow up with your Primary Care Provider.   We have sent your demographic information and a prescription for Xifaxan to [facility]. This pharmacy is able to get medication approved through insurance and get you the lowest copay possible. If you have not heard from them within 1 week, please call our office at [phone] to let us know.  Your provider has requested that you go to the basement level for lab work before leaving today. Press "B" on the elevator. The lab is located at the first door on the left as you exit the elevator.  We have sent the following medications to your pharmacy for you to pick up at your convenience: Xifaxan and lomotil   It was a pleasure to see you today!

## 2018-06-24 NOTE — Telephone Encounter (Signed)
The pt had questions regarding her stool testing, She was advised to call the lab and get instructions for stool testing.  The pt has been advised of the information and verbalized understanding.

## 2018-06-24 NOTE — Progress Notes (Signed)
Assessment and plan noted ?

## 2018-06-24 NOTE — Telephone Encounter (Signed)
PA initiated today through covermymeds

## 2018-06-24 NOTE — Progress Notes (Signed)
06/24/2018 Joan [name] [id number] 1946/07/11   HISTORY OF PRESENT ILLNESS: This is a 72 year old female who is a patient of Dr. Blanch Media.  A few years ago she had some severe issues with diarrhea.  Ultimately had colonoscopy in August 2016 at which time the study was normal with only severe diverticulosis.  Biopsies showed active colitis.  Ultimately the diarrhea resolved.  Last interventions included discontinuation of Arthrotec and treatment with Xifaxan.  She tells me that for the past 2 years she had been doing well up until 3 weeks ago.  Now has been having diarrhea again for the past 3 weeks.  She tells me that she has diarrhea about every time that she eats.  She denies any bleeding or fever.  Some nausea, but no vomiting.  Some abdominal discomfort but not significant pain.  She is woken up a few occasions at nighttime to have a bowel movement.  Has tried Imodium which did not help.  Has not had any recent antibiotic use or travel.    Past Medical History:  Diagnosis Date  . Alopecia areata 02/16/2006  . Anxiety state, unspecified 2004  . Benign paroxysmal positional vertigo 04/01/03  . Cancer ([place])    skin   . Cervicalgia 04/01/03  . Chronic rhinitis 09/01/05  . Corns and callosities   . Dermatophytosis of nail   . Diarrhea 04/15/2015  . Diverticulosis   . Dysphagia, unspecified(787.20) 2004  . Enthesopathy of ankle and tarsus, unspecified 03/31/96  . External hemorrhoids without mention of complication   . 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 Fibromyalgia   . Hypertension 2004  . Hypothyroidism   . Impacted cerumen    right ear  . Lumbago   . Myalgia and myositis, unspecified 03/01/05  . Nontoxic uninodular goiter   . Osteoarthrosis, unspecified whether generalized or localized, unspecified site 04/01/03  . Other left bundle branch block   . Rheumatoid arthritis(714.0) 10/05/2004   lumbar spondylosis. Lumbo-sacral anterolisthesis    . Senile osteoporosis 04/01/03  .  Sprain of metatarsophalangeal (joint) of foot   . Thyrotoxicosis   . Thyrotoxicosis without mention of goiter or other cause, without mention of thyrotoxic crisis or storm   . Unspecified vitamin D deficiency   . Urinary frequency    Past Surgical History:  Procedure Laterality Date  . COLONOSCOPY    . COLONOSCOPY N/A 04/15/2015   Procedure: COLONOSCOPY;  Surgeon: Gatha Mayer, MD;  Location: [facility];  Service: Endoscopy;  Laterality: N/A;  . DG FINGERS, MULTIPLE LT HAND ([place] [facility]X)    . FOOT SURGERY Bilateral 05/22/2014   Dr.Strom   . LUMBAR DISC SURGERY  12/12/2017   Dr Arnoldo Morale  . [place]   DR [place]   . TONSILLECTOMY AND ADENOIDECTOMY  1974  . TUBAL LIGATION  1973    reports that she has never smoked. She has never used smokeless tobacco. She reports that she does not drink alcohol or use drugs. family history includes Heart attack in her father; Heart disease in her father. Allergies  Allergen Reactions  . Arava [Leflunomide] Diarrhea, Nausea Only and Other (See Comments)    Loss appetite  . Penicillins Rash and Other (See Comments)    PATIENT HAS HAD A PCN REACTION WITH IMMEDIATE RASH, FACIAL/TONGUE/THROAT SWELLING, SOB, OR LIGHTHEADEDNESS WITH HYPOTENSION:  #  #  YES  #  #  Has patient had  a PCN reaction causing severe rash involving mucus membranes or skin necrosis: no Has patient had a PCN reaction that required hospitalization: no   . Methotrexate Derivatives Other (See Comments)    Hair loss  . Neurontin [Gabapentin] Nausea And Vomiting      Outpatient Encounter Medications as of 06/24/2018  Medication Sig  . acetaminophen (TYLENOL) 500 MG tablet Take 1,000 mg by mouth as needed for mild pain or moderate pain.   Marland Kitchen amitriptyline (ELAVIL) 10 MG tablet TAKE 1 TABLET BY MOUTH AT BEDTIME TO  HELP  RELAX  MUSCLES  . Biotin 1000 MCG tablet Take 1 tablet (1 mg total) by mouth 2 (two) times  daily.  . Calcium Carb-Cholecalciferol (CALTRATE 600+D3) 600-800 MG-UNIT TABS Take 1 tablet by mouth 2 (two) times daily.  . cycloSPORINE (RESTASIS) 0.05 % ophthalmic emulsion Place 1 drop into both eyes 2 (two) times daily.  Scarlette Shorts SURECLICK 50 MG/ML injection Inject 50 mg as directed once a week. Inject once weekly for arthritis  . fluticasone (FLONASE) 50 MCG/ACT nasal spray Place 1 spray into both nostrils daily.  . folic acid (FOLVITE) 1 MG tablet Take 1 mg by mouth daily.  . furosemide (LASIX) 40 MG tablet Take 1 tablet (40 mg total) by mouth daily.  . Glucosamine-Chondroit-Vit C-Mn (GLUCOSAMINE CHONDR 500 COMPLEX PO) Take 2 tablets by mouth daily.   . hydrochlorothiazide (HYDRODIURIL) 25 MG tablet Take 1 tablet (25 mg total) by mouth daily.  Marland Kitchen losartan (COZAAR) 100 MG tablet Take 1 tablet (100 mg total) by mouth daily.  . Menthol, Topical Analgesic, (ASPERCREME MAX ROLL-ON EX) Apply topically.  . Multiple Vitamin (MULTIVITAMIN) tablet Take 1 tablet by mouth daily.   . Omega-3 Fatty Acids (FISH OIL) 1000 MG CAPS Take 1,000 mg by mouth daily.   . predniSONE (DELTASONE) 5 MG tablet TAKE 1 TABLET BY MOUTH WITH BREAKFAST  . Propylene Glycol (SYSTANE BALANCE) 0.6 % SOLN Place 1 drop into both eyes 3 (three) times daily as needed (dry eyes).  . traMADol (ULTRAM) 50 MG tablet Take by mouth 2 (two) times daily as needed.  . traMADol (ULTRAM) 50 MG tablet Take 1 tablet (50 mg total) by mouth 2 (two) times daily.  . urea (CARMOL) 10 % cream Apply topically as needed.  . [DISCONTINUED] docusate sodium (COLACE) 100 MG capsule Take 1 capsule (100 mg total) by mouth 2 (two) times daily.   No facility-administered encounter medications on file as of 06/24/2018.      REVIEW OF SYSTEMS  : All other systems reviewed and negative except where noted in the History of Present Illness.   PHYSICAL EXAM: BP (!) 90/50   Pulse 70   Ht 5' (1.524 m)   Wt 108 lb (49 kg)   BMI 21.09 kg/m  General: Well  developed white female in no acute distress Head: Normocephalic and atraumatic Eyes:  Sclerae anicteric, conjunctiva pink. Ears: Normal auditory acuity Lungs: Clear throughout to auscultation; no increased WOB. Heart: Regular rate and rhythm; no M/R/G. Abdomen: Soft, non-distended.  BS present.  Non-tender. Musculoskeletal: Symmetrical with no gross deformities  Skin: No lesions on visible extremities Extremities: No edema  Neurological: Alert oriented x 4, grossly non-focal Psychological:  Alert and cooperative. Normal mood and affect  ASSESSMENT AND PLAN: *Acute diarrhea:  Had severe diarrhea issues a couple of years ago.  Did well for the past 2 years.  Last intervention was with discontinuation of Arthrotec and course of xifaxan.  She remains off of Arthrotec  and is not on any other new medications.  Having diarrhea every time she eats.  Will check stool studies.  Will empirically start course of xifaxan 550 mg TID for 2 weeks.  Will refill Lomotil to be used prn as well.  Discussed keeping hydrated with plenty of fluids and bland diet.     CC:  Reed, Tiffany L, DO

## 2018-06-24 NOTE — Telephone Encounter (Signed)
The pt will have the pharmacy send a prior auth form so that we can try to get it covered.

## 2018-06-24 NOTE — Telephone Encounter (Signed)
Pt called to inform that her insurance does not cover lomotil. Wants to know if there is another alternative.

## 2018-06-24 NOTE — Telephone Encounter (Signed)
Left message on machine to call back  

## 2018-06-24 NOTE — Telephone Encounter (Signed)
Patient states pharmacy already sent ins prior auth. Patient also wanting to know if the bottle sent home, needs to be filled at least half way.

## 2018-06-25 ENCOUNTER — Other Ambulatory Visit: Payer: Medicare Other

## 2018-06-25 DIAGNOSIS — R197 Diarrhea, unspecified: Secondary | ICD-10-CM

## 2018-06-26 ENCOUNTER — Telehealth: Payer: Self-pay | Admitting: Gastroenterology

## 2018-06-26 ENCOUNTER — Other Ambulatory Visit: Payer: Self-pay

## 2018-06-26 MED ORDER — RIFAXIMIN 550 MG PO TABS: 550.0000 mg | ORAL_TABLET | Freq: Three times a day (TID) | ORAL | 0 refills | Status: DC

## 2018-06-26 NOTE — Telephone Encounter (Signed)
Lomotil approved. Sent approval letter to [facility] in [place] to inform.

## 2018-06-26 NOTE — Telephone Encounter (Signed)
Rx re-sent as requested.

## 2018-06-27 ENCOUNTER — Telehealth: Payer: Self-pay | Admitting: Gastroenterology

## 2018-06-27 LAB — GASTROINTESTINAL PATHOGEN PANEL PCR
C. difficile Tox A/B, PCR: NOT DETECTED
Campylobacter, PCR: NOT DETECTED
Cryptosporidium, PCR: NOT DETECTED
E coli (ETEC) LT/ST PCR: NOT DETECTED
E coli (STEC) stx1/stx2, PCR: NOT DETECTED
E coli 0157, PCR: NOT DETECTED
Giardia lamblia, PCR: NOT DETECTED
Norovirus, PCR: NOT DETECTED
Rotavirus A, PCR: NOT DETECTED
Salmonella, PCR: NOT DETECTED
Shigella, PCR: NOT DETECTED

## 2018-06-27 LAB — CLOSTRIDIUM DIFFICILE TOXIN B, QUALITATIVE, REAL-TIME PCR: CDIFFPCR: NOT DETECTED

## 2018-06-27 NOTE — Telephone Encounter (Signed)
Pt called to inform that lomotil and xifaxan need PA.

## 2018-06-27 NOTE — Telephone Encounter (Signed)
Per Magdalene River CMA the prior auth was sent on 06/27/18.

## 2018-06-27 NOTE — Telephone Encounter (Signed)
Pt is aware the Rx Lomotil has been approved. Doreene Nest is being worked on for a prior [place] with [facility].

## 2018-07-01 ENCOUNTER — Telehealth: Payer: Self-pay | Admitting: Gastroenterology

## 2018-07-01 ENCOUNTER — Telehealth: Payer: Self-pay | Admitting: Internal Medicine

## 2018-07-01 NOTE — Telephone Encounter (Signed)
Joan [name] it looks like you have been working on this.  Thanks

## 2018-07-01 NOTE — Telephone Encounter (Signed)
Error

## 2018-07-02 NOTE — Telephone Encounter (Signed)
Called [facility] for status update. Looks like the records they requested were never received despite that they have been faxed to [facility] on several occassions  as they requested. Left a voice mail to inform pt. Records re faxed to an alternate fax number given [phone]

## 2018-07-08 ENCOUNTER — Encounter: Payer: Self-pay | Admitting: Internal Medicine

## 2018-07-08 ENCOUNTER — Ambulatory Visit: Payer: Medicare Other | Admitting: Internal Medicine

## 2018-07-08 VITALS — BP 138/80 | HR 82 | Temp 98.9°F | Ht 60.0 in | Wt 109.0 lb

## 2018-07-08 DIAGNOSIS — I10 Essential (primary) hypertension: Secondary | ICD-10-CM | POA: Diagnosis not present

## 2018-07-08 DIAGNOSIS — M059 Rheumatoid arthritis with rheumatoid factor, unspecified: Secondary | ICD-10-CM | POA: Diagnosis not present

## 2018-07-08 DIAGNOSIS — E039 Hypothyroidism, unspecified: Secondary | ICD-10-CM

## 2018-07-08 DIAGNOSIS — K529 Noninfective gastroenteritis and colitis, unspecified: Secondary | ICD-10-CM

## 2018-07-08 DIAGNOSIS — Z23 Encounter for immunization: Secondary | ICD-10-CM | POA: Diagnosis not present

## 2018-07-08 DIAGNOSIS — M5387 Other specified dorsopathies, lumbosacral region: Secondary | ICD-10-CM | POA: Diagnosis not present

## 2018-07-08 DIAGNOSIS — Z7952 Long term (current) use of systemic steroids: Secondary | ICD-10-CM

## 2018-07-08 DIAGNOSIS — M810 Age-related osteoporosis without current pathological fracture: Secondary | ICD-10-CM

## 2018-07-08 DIAGNOSIS — D638 Anemia in other chronic diseases classified elsewhere: Secondary | ICD-10-CM

## 2018-07-08 DIAGNOSIS — E785 Hyperlipidemia, unspecified: Secondary | ICD-10-CM

## 2018-07-08 NOTE — Progress Notes (Signed)
Location:  [facility] clinic Provider:  Rosebud Koenen L. Mariea Clonts, D.O., C.M.D.  Goals of Care:  Advanced Directives 02/18/2018  Does Patient Have a Medical Advance Directive? No  Type of Advance Directive -  Copy of [place] in Chart? -  Would patient like information on creating a medical advance directive? No - Patient declined     Chief Complaint  Patient presents with  . Medical Management of Chronic Issues    83mth follow-up    HPI: Patient is a 72 y.o. female seen today for medical management of chronic diseases.    Right ear on the side--she'd been to derm and had a biopsy a few years ago.  Had ear oil she was using.  She could not find the bottle from the past.  Hurts at night when she rests on it.  Gets a scab on it.  Last to derm in summer for check up.  Just started recently now.    She had diarrhea for 3 weeks 2 weeks ago.  Saw Jessica.  Using lomotil.  xifaxan has not come through yet from Dr. Blanch Media office.  The xifaxan had worked for her in the past.  Loose stools are better now though. Is off her bland diet now.    She has magnesium and isn't sure why.  Opted to stop.  [facility] is to call her about second shingrix.    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  BP good here.  BP low at her specialist's office when she had her diarrhea.    Takes her ca with D bid.   Has prolia next Friday at rheumatology.  Takes enbrel injections for her RA.    Past Medical History:  Diagnosis Date  . Alopecia areata 02/16/2006  . Anxiety state, unspecified 2004  . Benign paroxysmal positional vertigo 04/01/03  . Cancer ([place])    skin   . Cervicalgia 04/01/03  . Chronic rhinitis 09/01/05  . Corns and callosities   . Dermatophytosis of nail   . Diarrhea 04/15/2015  . Diverticulosis   . Dysphagia, unspecified(787.20) 2004  . Enthesopathy of ankle and tarsus, unspecified 03/31/96  . External hemorrhoids  without mention of complication   . 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 Fibromyalgia   . Hypertension 2004  . Hypothyroidism   . Impacted cerumen    right ear  . Lumbago   . Myalgia and myositis, unspecified 03/01/05  . Nontoxic uninodular goiter   . Osteoarthrosis, unspecified whether generalized or localized, unspecified site 04/01/03  . Other left bundle branch block   . Rheumatoid arthritis(714.0) 10/05/2004   lumbar spondylosis. Lumbo-sacral anterolisthesis    . Senile osteoporosis 04/01/03  . Sprain of metatarsophalangeal (joint) of foot   . Thyrotoxicosis   . Thyrotoxicosis without mention of goiter or other cause, without mention of thyrotoxic crisis or storm   . Unspecified vitamin D deficiency   . Urinary frequency     Past Surgical History:  Procedure Laterality Date  . COLONOSCOPY    . COLONOSCOPY N/A 04/15/2015   Procedure: COLONOSCOPY;  Surgeon: Gatha Mayer, MD;  Location: [place];  Service: Endoscopy;  Laterality: N/A;  . DG FINGERS, MULTIPLE LT HAND ([place] HX)    . FOOT SURGERY Bilateral 05/22/2014   Dr.Strom   . LUMBAR DISC SURGERY  12/12/2017   Dr Arnoldo Morale  . [place]   .  RIGHT WRIST SUGERY FOR TENOSYNOVITIS  1997   DR [facility]   . TONSILLECTOMY AND ADENOIDECTOMY  1974  . TUBAL LIGATION  1973    Allergies  Allergen Reactions  . Arava [Leflunomide] Diarrhea, Nausea Only and Other (See Comments)    Loss appetite  . Penicillins Rash and Other (See Comments)    PATIENT HAS HAD A PCN REACTION WITH IMMEDIATE RASH, FACIAL/TONGUE/THROAT SWELLING, SOB, OR LIGHTHEADEDNESS WITH HYPOTENSION:  #  #  YES  #  #  Has patient had a PCN reaction causing severe rash involving mucus membranes or skin necrosis: no Has patient had a PCN reaction that required hospitalization: no   . Methotrexate Derivatives Other (See Comments)    Hair loss  . Neurontin [Gabapentin] Nausea And Vomiting    Outpatient Encounter Medications as of 07/08/2018    Medication Sig  . acetaminophen (TYLENOL) 500 MG tablet Take 1,000 mg by mouth as needed for mild pain or moderate pain.   Marland Kitchen amitriptyline (ELAVIL) 10 MG tablet TAKE 1 TABLET BY MOUTH AT BEDTIME TO  HELP  RELAX  MUSCLES  . Biotin 1000 MCG tablet Take 1 tablet (1 mg total) by mouth 2 (two) times daily.  . Calcium Carb-Cholecalciferol (CALTRATE 600+D3) 600-800 MG-UNIT TABS Take 1 tablet by mouth 2 (two) times daily.  . cycloSPORINE (RESTASIS) 0.05 % ophthalmic emulsion Place 1 drop into both eyes 2 (two) times daily.  . diphenoxylate-atropine (LOMOTIL) 2.5-0.025 MG tablet 1-2 every 8 hours as needed  . ENBREL SURECLICK 50 MG/ML injection Inject 50 mg as directed once a week. Inject once weekly for arthritis  . fluticasone (FLONASE) 50 MCG/ACT nasal spray Place 1 spray into both nostrils daily.  . folic acid (FOLVITE) 1 MG tablet Take 1 mg by mouth daily.  . furosemide (LASIX) 40 MG tablet Take 1 tablet (40 mg total) by mouth daily.  . Glucosamine-Chondroit-Vit C-Mn (GLUCOSAMINE CHONDR 500 COMPLEX PO) Take 2 tablets by mouth daily.   . hydrochlorothiazide (HYDRODIURIL) 25 MG tablet Take 1 tablet (25 mg total) by mouth daily.  Marland Kitchen losartan (COZAAR) 100 MG tablet Take 1 tablet (100 mg total) by mouth daily.  . Menthol, Topical Analgesic, (ASPERCREME MAX ROLL-ON EX) Apply topically.  . Multiple Vitamin (MULTIVITAMIN) tablet Take 1 tablet by mouth daily.   . Omega-3 Fatty Acids (FISH OIL) 1000 MG CAPS Take 1,000 mg by mouth daily.   . predniSONE (DELTASONE) 5 MG tablet TAKE 1 TABLET BY MOUTH WITH BREAKFAST  . Propylene Glycol (SYSTANE BALANCE) 0.6 % SOLN Place 1 drop into both eyes 3 (three) times daily as needed (dry eyes).  . rifaximin (XIFAXAN) 550 MG TABS tablet Take 1 tablet (550 mg total) by mouth 3 (three) times daily.  . traMADol (ULTRAM) 50 MG tablet Take by mouth 2 (two) times daily as needed.  . urea (CARMOL) 10 % cream Apply topically as needed.   No facility-administered encounter  medications on file as of 07/08/2018.     Review of Systems:  Review of Systems  Constitutional: Negative for chills and fever.  HENT: Positive for ear pain. Negative for congestion.   Eyes: Negative for blurred vision.  Respiratory: Negative for shortness of breath.   Cardiovascular: Negative for chest pain, palpitations and leg swelling.  Gastrointestinal: Positive for diarrhea. Negative for abdominal pain, blood in stool, constipation, melena, nausea and vomiting.  Genitourinary: Negative for dysuria.  Musculoskeletal: Positive for joint pain. Negative for back pain and falls.  Skin:       Scaly  area on right ear that is painful when she sleeps on it  Neurological: Negative for dizziness and loss of consciousness.  Endo/Heme/Allergies: Bruises/bleeds easily.  Psychiatric/Behavioral: Negative for depression and memory loss. The patient is not nervous/anxious and does not have insomnia.     Health Maintenance  Topic Date Due  . Hepatitis C Screening  1946/07/11  . TETANUS/TDAP  09/05/2015  . INFLUENZA VACCINE  04/04/2018  . MAMMOGRAM  02/19/2020  . COLONOSCOPY  04/14/2025  . DEXA SCAN  Completed  . PNA vac Low Risk Adult  Completed    Physical Exam: Vitals:   07/08/18 0952  BP: 138/80  Pulse: 82  Temp: 98.9 F (37.2 C)  TempSrc: Oral  SpO2: 96%  Weight: 109 lb (49.4 kg)  Height: 5' (1.524 m)   Body mass index is 21.29 kg/m. Physical Exam  Constitutional: She is oriented to person, place, and time. She appears well-developed and well-nourished. No distress.  HENT:  Head: Normocephalic and atraumatic.  Cardiovascular: Normal rate, regular rhythm, normal heart sounds and intact distal pulses.  Pulmonary/Chest: Effort normal and breath sounds normal. No respiratory distress.  Abdominal: Bowel sounds are normal.  Musculoskeletal: Normal range of motion.  Takes small steps  Neurological: She is alert and oriented to person, place, and time.  Skin: Skin is warm and  dry. Capillary refill takes less than 2 seconds.  Psychiatric: She has a normal mood and affect.    Labs reviewed: Basic Metabolic Panel: Recent Labs    12/03/17 0934 12/13/17 0635 02/12/18 0840  NA 139 137 139  K 4.2 3.3* 3.8  CL 101 101 101  CO2 29 26 31   GLUCOSE 96 151* 86  BUN 29* 19 24  CREATININE 0.85 0.72 0.60  CALCIUM 8.9 8.0* 8.5*   Liver Function Tests: Recent Labs    02/12/18 0840  AST 13  ALT 8  BILITOT 0.3  PROT 6.6   No results for input(s): LIPASE, AMYLASE in the last 8760 hours. No results for input(s): AMMONIA in the last 8760 hours. CBC: Recent Labs    12/03/17 0934 12/13/17 0635 02/12/18 0840  WBC 12.8* 19.5* 10.7  NEUTROABS  --   --  6,227  HGB 12.0 9.2* 10.5*  HCT 38.2 28.0* 32.1*  MCV 98.7 96.6 87.0  PLT 330 246 359   Lipid Panel: Recent Labs    02/12/18 0840  CHOL 150  HDL 59  LDLCALC 72  TRIG 111  CHOLHDL 2.5   Assessment/Plan 1. Need for influenza vaccination - Flu vaccine HIGH DOSE PF (Fluzone High dose)  2. Colitis -improved with lomotil use, but has not gotten xifaxan yet--samples provided from our supply pending receipt of those from GI  3. Essential hypertension -bp at goal  4. Sciatica of right side associated with disorder of lumbosacral spine -resolved with lumbar surgery, doing much better  5. Rheumatoid arthritis with positive rheumatoid factor, involving unspecified site [facility]) -continues on enbrel through rheum  6. Senile osteoporosis -cont prolia and ca with D thru rheum  7. Current use of steroid medication - Hemoglobin A1c; Future--check sugar average due to ongoing prednisone use  8. Hypothyroidism, unspecified type -cont levothyroxine therapy - TSH; Future  9. Hyperlipidemia, unspecified hyperlipidemia type - not on statin therapy - COMPLETE METABOLIC PANEL WITH GFR; Future - Lipid panel; Future  10. Anemia, chronic disease - f/u lab:   - CBC with Differential/Platelet;  Future   Labs/tests ordered:   Orders Placed This Encounter  Procedures  . Flu  vaccine HIGH DOSE PF (Fluzone High dose)  . CBC with Differential/Platelet    Standing Status:   Future    Standing Expiration Date:   07/09/2019  . COMPLETE METABOLIC PANEL WITH GFR    Standing Status:   Future    Standing Expiration Date:   07/09/2019  . Lipid panel    Standing Status:   Future    Standing Expiration Date:   07/09/2019  . TSH    Standing Status:   Future    Standing Expiration Date:   07/09/2019  . Hemoglobin A1c    Standing Status:   Future    Standing Expiration Date:   07/09/2019    Next appt:  10/28/2018   Neosha Switalski L. Gypsy Kellogg, D.O. [facility] [address],  [address] Cell Phone (Mon-Fri 8am-5pm):  [phone] On Call:  [phone] & follow prompts after 5pm & weekends Office Phone:  [phone] Office Fax:  [phone]

## 2018-07-08 NOTE — Patient Instructions (Addendum)
Try a low dose hydrocortisone cream like 0.5% for your right ear.  If that is not helpful, follow up with your dermatologist.  Check with [facility] about your second shingrix vaccine--it must be given by 08/21/18 (within 2-6 months of your first one).

## 2018-07-15 NOTE — Telephone Encounter (Signed)
Xifaxan has been approved. She does have a $100 dollar co pay. [facility] to mail Rx to patient

## 2018-07-17 ENCOUNTER — Ambulatory Visit: Payer: Medicare Other | Admitting: Internal Medicine

## 2018-08-15 ENCOUNTER — Other Ambulatory Visit: Payer: Self-pay | Admitting: Internal Medicine

## 2018-08-16 NOTE — Telephone Encounter (Signed)
Will monitor patient.

## 2018-08-16 NOTE — Telephone Encounter (Signed)
High medication interaction with Tramadol and amitriptyline.  Please advise on refill

## 2018-09-10 ENCOUNTER — Ambulatory Visit: Payer: Medicare Other | Admitting: Adult Health

## 2018-09-10 ENCOUNTER — Ambulatory Visit: Payer: Self-pay | Admitting: Adult Health

## 2018-09-10 ENCOUNTER — Encounter: Payer: Self-pay | Admitting: Adult Health

## 2018-09-10 VITALS — BP 124/70 | HR 83 | Temp 98.1°F | Resp 10 | Ht 60.0 in | Wt 108.8 lb

## 2018-09-10 DIAGNOSIS — R040 Epistaxis: Secondary | ICD-10-CM

## 2018-09-10 DIAGNOSIS — J3089 Other allergic rhinitis: Secondary | ICD-10-CM | POA: Diagnosis not present

## 2018-09-10 DIAGNOSIS — J0100 Acute maxillary sinusitis, unspecified: Secondary | ICD-10-CM | POA: Diagnosis not present

## 2018-09-10 MED ORDER — SACCHAROMYCES BOULARDII 250 MG PO CAPS: 250.0000 mg | ORAL_CAPSULE | Freq: Two times a day (BID) | ORAL | 0 refills | Status: AC

## 2018-09-10 MED ORDER — LORATADINE 10 MG PO TABS: 10.0000 mg | ORAL_TABLET | Freq: Every day | ORAL | 0 refills | Status: DC

## 2018-09-10 MED ORDER — FLUTICASONE PROPIONATE 50 MCG/ACT NA SUSP: 1.0000 | Freq: Every day | NASAL | 0 refills | Status: DC

## 2018-09-10 MED ORDER — DOXYCYCLINE HYCLATE 100 MG PO TABS: 100.0000 mg | ORAL_TABLET | Freq: Two times a day (BID) | ORAL | 0 refills | Status: DC

## 2018-09-10 NOTE — Progress Notes (Signed)
[facility] clinic  Provider:  Monina C. Medina-Vargas - FNP   Goals of Care:  Advanced Directives 02/18/2018  Does Patient Have a Medical Advance Directive? No  Type of Advance Directive -  Copy of [place] in Chart? -  Would patient like information on creating a medical advance directive? No - Patient declined     Chief Complaint  Patient presents with  . Acute Visit    URI symptoms since Sunday 09/08/2018    HPI: Patient is a 73 y.o. female seen today for an acute visit for URI symptoms X 3 days. She said that she has been having runny nose with clear drainage, painful cheekbones and sinus congestion. She denies having fever. She verbalized having Orencia infusion for her RA yesterday. She had nasalbleed last night.   Past Medical History:  Diagnosis Date  . Alopecia areata 02/16/2006  . Anxiety state, unspecified 2004  . Benign paroxysmal positional vertigo 04/01/03  . Cancer ([place])    skin   . Cervicalgia 04/01/03  . Chronic rhinitis 09/01/05  . Corns and callosities   . Dermatophytosis of nail   . Diarrhea 04/15/2015  . Diverticulosis   . Dysphagia, unspecified(787.20) 2004  . Enthesopathy of ankle and tarsus, unspecified 03/31/96  . External hemorrhoids without mention of complication   . 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 Fibromyalgia   . Hypertension 2004  . Hypothyroidism   . Impacted cerumen    right ear  . Lumbago   . Myalgia and myositis, unspecified 03/01/05  . Nontoxic uninodular goiter   . Osteoarthrosis, unspecified whether generalized or localized, unspecified site 04/01/03  . Other left bundle branch block   . Rheumatoid arthritis(714.0) 10/05/2004   lumbar spondylosis. Lumbo-sacral anterolisthesis    . Senile osteoporosis 04/01/03  . Sprain of metatarsophalangeal (joint) of foot   . Thyrotoxicosis   . Thyrotoxicosis without mention of goiter or other cause, without mention of thyrotoxic crisis or storm   . Unspecified vitamin D deficiency    . Urinary frequency     Past Surgical History:  Procedure Laterality Date  . COLONOSCOPY    . COLONOSCOPY N/A 04/15/2015   Procedure: COLONOSCOPY;  Surgeon: Gatha Mayer, MD;  Location: [place];  Service: Endoscopy;  Laterality: N/A;  . DG FINGERS, MULTIPLE LT HAND ([place] HX)    . FOOT SURGERY Bilateral 05/22/2014   Dr.Strom   . LUMBAR DISC SURGERY  12/12/2017   Dr Arnoldo Morale  . [place]   DR [place]   . TONSILLECTOMY AND ADENOIDECTOMY  1974  . TUBAL LIGATION  1973    Allergies  Allergen Reactions  . Arava [Leflunomide] Diarrhea, Nausea Only and Other (See Comments)    Loss appetite  . Penicillins Rash and Other (See Comments)    PATIENT HAS HAD A PCN REACTION WITH IMMEDIATE RASH, FACIAL/TONGUE/THROAT SWELLING, SOB, OR LIGHTHEADEDNESS WITH HYPOTENSION:  #  #  YES  #  #  Has patient had a PCN reaction causing severe rash involving mucus membranes or skin necrosis: no Has patient had a PCN reaction that required hospitalization: no   . Methotrexate Derivatives Other (See Comments)    Hair loss  . Neurontin [Gabapentin] Nausea And Vomiting    Outpatient Encounter Medications as of 09/10/2018  Medication Sig  . Abatacept (ORENCIA IV) Inject into the vein. Infusion every 2 weeks, initiated by Dr.Beekman  . acetaminophen (TYLENOL)  500 MG tablet Take 1,000 mg by mouth as needed for mild pain or moderate pain.   Marland Kitchen amitriptyline (ELAVIL) 10 MG tablet TAKE 1 TABLET BY MOUTH AT BEDTIME TO  HELP  RELAX  MUSCLES  . Biotin 1000 MCG tablet Take 1 tablet (1 mg total) by mouth 2 (two) times daily.  . Calcium Carb-Cholecalciferol (CALTRATE 600+D3) 600-800 MG-UNIT TABS Take 1 tablet by mouth 2 (two) times daily.  . cycloSPORINE (RESTASIS) 0.05 % ophthalmic emulsion Place 1 drop into both eyes 2 (two) times daily.  . diphenoxylate-atropine (LOMOTIL) 2.5-0.025 MG tablet 1-2 every 8 hours as needed  . fluticasone  (FLONASE) 50 MCG/ACT nasal spray Place 1 spray into both nostrils daily.  . folic acid (FOLVITE) 1 MG tablet Take 1 mg by mouth daily.  . furosemide (LASIX) 40 MG tablet Take 40 mg by mouth as needed.  . Glucosamine-Chondroit-Vit C-Mn (GLUCOSAMINE CHONDR 500 COMPLEX PO) Take 2 tablets by mouth daily.   . hydrochlorothiazide (HYDRODIURIL) 25 MG tablet Take 1 tablet (25 mg total) by mouth daily.  Marland Kitchen losartan (COZAAR) 100 MG tablet Take 1 tablet (100 mg total) by mouth daily.  . Menthol, Topical Analgesic, (ASPERCREME MAX ROLL-ON EX) Apply topically.  . Multiple Vitamin (MULTIVITAMIN) tablet Take 1 tablet by mouth daily.   . Omega-3 Fatty Acids (FISH OIL) 1000 MG CAPS Take 1,000 mg by mouth daily.   . predniSONE (DELTASONE) 5 MG tablet TAKE 1 TABLET BY MOUTH WITH BREAKFAST  . Propylene Glycol (SYSTANE BALANCE) 0.6 % SOLN Place 1 drop into both eyes 2 (two) times daily as needed (dry eyes).   . rifaximin (XIFAXAN) 550 MG TABS tablet Take 1 tablet (550 mg total) by mouth 3 (three) times daily.  . traMADol (ULTRAM) 50 MG tablet Take by mouth 2 (two) times daily as needed.  . urea (CARMOL) 10 % cream Apply topically as needed.  . [DISCONTINUED] furosemide (LASIX) 40 MG tablet Take 1 tablet (40 mg total) by mouth daily.  . [DISCONTINUED] ENBREL SURECLICK 50 MG/ML injection Inject 50 mg as directed once a week. Inject once weekly for arthritis   No facility-administered encounter medications on file as of 09/10/2018.     Review of Systems:  Review of Systems  Constitutional: Positive for chills. Negative for activity change.  HENT: Positive for congestion, facial swelling, nosebleeds, sinus pressure and sinus pain. Negative for sneezing and sore throat.   Eyes: Negative.  Negative for discharge, itching and visual disturbance.  Respiratory: Positive for shortness of breath. Negative for cough and wheezing.   Cardiovascular: Negative.  Negative for chest pain and palpitations.  Gastrointestinal:  Negative.  Negative for abdominal distention, abdominal pain, constipation and vomiting.  Endocrine: Negative.  Negative for cold intolerance.  Genitourinary: Negative.  Negative for difficulty urinating, dysuria and frequency.  Skin: Negative.  Negative for rash.  Neurological: Negative for facial asymmetry, weakness and light-headedness.  Psychiatric/Behavioral: Negative.  Negative for sleep disturbance.    Health Maintenance  Topic Date Due  . Hepatitis C Screening  02/06/1946  . TETANUS/TDAP  09/05/2015  . MAMMOGRAM  02/19/2020  . COLONOSCOPY  04/14/2025  . INFLUENZA VACCINE  Completed  . DEXA SCAN  Completed  . PNA vac Low Risk Adult  Completed    Physical Exam: Vitals:   09/10/18 0929  BP: 124/70  Pulse: 83  Resp: 10  Temp: 98.1 F (36.7 C)  TempSrc: Oral  SpO2: 95%  Weight: 108 lb 12.8 oz (49.4 kg)  Height: 5' (1.524 m)  Body mass index is 21.25 kg/m. Physical Exam Vitals signs reviewed.  Constitutional:      Appearance: Normal appearance.  HENT:     Head: Normocephalic.     Comments: She has maxillofacial tenderness    Nose: Congestion present.     Mouth/Throat:     Mouth: Mucous membranes are moist.     Pharynx: Oropharynx is clear.  Neck:     Musculoskeletal: Normal range of motion and neck supple. No neck rigidity.  Cardiovascular:     Rate and Rhythm: Normal rate and regular rhythm.     Pulses: Normal pulses.     Heart sounds: Normal heart sounds.  Pulmonary:     Effort: Pulmonary effort is normal.     Breath sounds: Normal breath sounds.  Abdominal:     General: Bowel sounds are normal.     Palpations: Abdomen is soft.  Musculoskeletal: Normal range of motion.  Skin:    General: Skin is warm and dry.  Neurological:     Mental Status: She is alert.  Psychiatric:        Mood and Affect: Mood normal.        Behavior: Behavior normal.        Thought Content: Thought content normal.        Judgment: Judgment normal.     Labs  reviewed: Basic Metabolic Panel: Recent Labs    12/03/17 0934 12/13/17 0635 02/12/18 0840  NA 139 137 139  K 4.2 3.3* 3.8  CL 101 101 101  CO2 29 26 31   GLUCOSE 96 151* 86  BUN 29* 19 24  CREATININE 0.85 0.72 0.60  CALCIUM 8.9 8.0* 8.5*   Liver Function Tests: Recent Labs    02/12/18 0840  AST 13  ALT 8  BILITOT 0.3  PROT 6.6   CBC: Recent Labs    12/03/17 0934 12/13/17 0635 02/12/18 0840  WBC 12.8* 19.5* 10.7  NEUTROABS  --   --  6,227  HGB 12.0 9.2* 10.5*  HCT 38.2 28.0* 32.1*  MCV 98.7 96.6 87.0  PLT 330 246 359   Lipid Panel: Recent Labs    02/12/18 0840  CHOL 150  HDL 59  LDLCALC 72  TRIG 111  CHOLHDL 2.5    Assessment/Plan  1. Acute maxillary sinusitis, recurrence not specified - doxycycline (VIBRA-TABS) 100 MG tablet; Take 1 tablet (100 mg total) by mouth 2 (two) times daily.  Dispense: 20 tablet; Refill: 0 - saccharomyces boulardii (FLORASTOR) 250 MG capsule; Take 1 capsule (250 mg total) by mouth 2 (two) times daily for 13 days.  Dispense: 26 capsule; Refill: 0  2. Allergic rhinitis due to other allergic trigger, unspecified seasonality - fluticasone (FLONASE) 50 MCG/ACT nasal spray; Place 1 spray into both nostrils daily for 14 days.  Dispense: 1 g; Refill: 0 - loratadine (CLARITIN) 10 MG tablet; Take 1 tablet (10 mg total) by mouth daily for 14 days.  Dispense: 14 tablet; Refill: 0  3. Bleeding from the nose - Instructed to use humidifier at home to prevent dryness - Increase oral fluid intake    Next appt:  10/23/2018

## 2018-09-10 NOTE — Patient Instructions (Addendum)
Take Doxycycline 100 mg twice a day X 10 days and Florastor 250 mg 1 capsule twice a day X 13 days  Take Flonase and Claritin daily X 2 weeks.  Increase oral fluid intake.  Use humidifier at home.  Return to [facility] as needed, if symptoms persist.

## 2018-09-26 ENCOUNTER — Other Ambulatory Visit: Payer: Self-pay | Admitting: Internal Medicine

## 2018-09-26 DIAGNOSIS — M059 Rheumatoid arthritis with rheumatoid factor, unspecified: Secondary | ICD-10-CM

## 2018-10-21 LAB — TSH: TSH: 3.9 (ref 0.41–5.90)

## 2018-10-22 ENCOUNTER — Other Ambulatory Visit: Payer: Medicare Other

## 2018-10-23 ENCOUNTER — Other Ambulatory Visit: Payer: Medicare Other

## 2018-10-28 ENCOUNTER — Ambulatory Visit: Payer: Self-pay

## 2018-10-28 ENCOUNTER — Other Ambulatory Visit: Payer: Medicare Other

## 2018-10-28 ENCOUNTER — Encounter: Payer: Self-pay | Admitting: Internal Medicine

## 2018-10-28 ENCOUNTER — Encounter: Payer: Medicare Other | Admitting: Internal Medicine

## 2018-10-28 ENCOUNTER — Ambulatory Visit (INDEPENDENT_AMBULATORY_CARE_PROVIDER_SITE_OTHER): Payer: Medicare Other | Admitting: Internal Medicine

## 2018-10-28 ENCOUNTER — Encounter: Payer: Self-pay | Admitting: Family

## 2018-10-28 VITALS — BP 120/70 | HR 80 | Temp 98.1°F | Ht 60.0 in | Wt 107.0 lb

## 2018-10-28 DIAGNOSIS — H6123 Impacted cerumen, bilateral: Secondary | ICD-10-CM

## 2018-10-28 DIAGNOSIS — M059 Rheumatoid arthritis with rheumatoid factor, unspecified: Secondary | ICD-10-CM | POA: Diagnosis not present

## 2018-10-28 DIAGNOSIS — J040 Acute laryngitis: Secondary | ICD-10-CM | POA: Diagnosis not present

## 2018-10-28 DIAGNOSIS — Z7952 Long term (current) use of systemic steroids: Secondary | ICD-10-CM

## 2018-10-28 DIAGNOSIS — E785 Hyperlipidemia, unspecified: Secondary | ICD-10-CM

## 2018-10-28 DIAGNOSIS — E039 Hypothyroidism, unspecified: Secondary | ICD-10-CM

## 2018-10-28 DIAGNOSIS — D638 Anemia in other chronic diseases classified elsewhere: Secondary | ICD-10-CM

## 2018-10-28 MED ORDER — AZITHROMYCIN 250 MG PO TABS: ORAL_TABLET | ORAL | 0 refills | Status: DC

## 2018-10-28 MED ORDER — VITAMIN D3 50 MCG (2000 UT) PO CAPS: 2000.0000 [IU] | ORAL_CAPSULE | Freq: Every day | ORAL | 3 refills | Status: AC

## 2018-10-28 NOTE — Progress Notes (Signed)
Location:  [facility] clinic Provider:  Stony Stegmann L. Mariea Clonts, D.O., C.M.D.  Code Status: DNR Goals of Care:  Advanced Directives 02/18/2018  Does Patient Have a Medical Advance Directive? No  Type of Advance Directive -  Copy of [place] in Chart? -  Would patient like information on creating a medical advance directive? No - Patient declined     Chief Complaint  Patient presents with  . Acute Visit    sore throat    HPI: Patient is a 73 y.o. female seen today for acute visit for sore throat, runny nose, scratchy, hoarse throat.  No fever.  No chills.  No sick contacts she's aware of.  On orencia infusions and just had one and says she gets a cold almost every time.  Taking coricidin bp b/c of her high blood pressure.    Her ears are also full of wax.  She requests to have them cleaned out.    Past Medical History:  Diagnosis Date  . Alopecia areata 02/16/2006  . Anxiety state, unspecified 2004  . Benign paroxysmal positional vertigo 04/01/03  . Cancer ([place])    skin   . Cervicalgia 04/01/03  . Chronic rhinitis 09/01/05  . Corns and callosities   . Dermatophytosis of nail   . Diarrhea 04/15/2015  . Diverticulosis   . Dysphagia, unspecified(787.20) 2004  . Enthesopathy of ankle and tarsus, unspecified 03/31/96  . External hemorrhoids without mention of complication   . 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 Fibromyalgia   . Hypertension 2004  . Hypothyroidism   . Impacted cerumen    right ear  . Lumbago   . Myalgia and myositis, unspecified 03/01/05  . Nontoxic uninodular goiter   . Osteoarthrosis, unspecified whether generalized or localized, unspecified site 04/01/03  . Other left bundle branch block   . Rheumatoid arthritis(714.0) 10/05/2004   lumbar spondylosis. Lumbo-sacral anterolisthesis    . Senile osteoporosis 04/01/03  . Sprain of metatarsophalangeal (joint) of foot   . Thyrotoxicosis   . Thyrotoxicosis without mention of goiter or other cause, without  mention of thyrotoxic crisis or storm   . Unspecified vitamin D deficiency   . Urinary frequency     Past Surgical History:  Procedure Laterality Date  . COLONOSCOPY    . COLONOSCOPY N/A 04/15/2015   Procedure: COLONOSCOPY;  Surgeon: Gatha Mayer, MD;  Location: [facility];  Service: Endoscopy;  Laterality: N/A;  . DG FINGERS, MULTIPLE LT HAND ([place] HX)    . FOOT SURGERY Bilateral 05/22/2014   Dr.Strom   . LUMBAR DISC SURGERY  12/12/2017   Dr Arnoldo Morale  . [employer]   DR [place]   . TONSILLECTOMY AND ADENOIDECTOMY  1974  . TUBAL LIGATION  1973    Allergies  Allergen Reactions  . Arava [Leflunomide] Diarrhea, Nausea Only and Other (See Comments)    Loss appetite  . Penicillins Rash and Other (See Comments)    PATIENT HAS HAD A PCN REACTION WITH IMMEDIATE RASH, FACIAL/TONGUE/THROAT SWELLING, SOB, OR LIGHTHEADEDNESS WITH HYPOTENSION:  #  #  YES  #  #  Has patient had a PCN reaction causing severe rash involving mucus membranes or skin necrosis: no Has patient had a PCN reaction that required hospitalization: no   . Methotrexate Derivatives Other (See Comments)    Hair loss  . Neurontin [Gabapentin] Nausea And Vomiting    Outpatient Encounter Medications as  of 10/28/2018  Medication Sig  . Abatacept (ORENCIA IV) Inject into the vein. Infusion every 2 weeks, initiated by Dr.Beekman  . acetaminophen (TYLENOL) 500 MG tablet Take 1,000 mg by mouth as needed for mild pain or moderate pain.   Marland Kitchen amitriptyline (ELAVIL) 10 MG tablet TAKE 1 TABLET BY MOUTH AT BEDTIME TO  HELP  RELAX  MUSCLES  . Biotin 1000 MCG tablet Take 1 tablet (1 mg total) by mouth 2 (two) times daily.  . Calcium Carb-Cholecalciferol (CALTRATE 600+D3) 600-800 MG-UNIT TABS Take 1 tablet by mouth 2 (two) times daily.  . cycloSPORINE (RESTASIS) 0.05 % ophthalmic emulsion Place 1 drop into both eyes 2 (two) times daily.  . diphenoxylate-atropine  (LOMOTIL) 2.5-0.025 MG tablet 1-2 every 8 hours as needed  . fluticasone (FLONASE) 50 MCG/ACT nasal spray Place 1 spray into both nostrils daily for 14 days.  . folic acid (FOLVITE) 1 MG tablet Take 1 mg by mouth daily.  . furosemide (LASIX) 40 MG tablet Take 40 mg by mouth as needed.  . Glucosamine-Chondroit-Vit C-Mn (GLUCOSAMINE CHONDR 500 COMPLEX PO) Take 2 tablets by mouth daily.   . hydrochlorothiazide (HYDRODIURIL) 25 MG tablet Take 1 tablet (25 mg total) by mouth daily.  Marland Kitchen losartan (COZAAR) 100 MG tablet Take 1 tablet (100 mg total) by mouth daily.  . Menthol, Topical Analgesic, (ASPERCREME MAX ROLL-ON EX) Apply topically.  . Multiple Vitamin (MULTIVITAMIN) tablet Take 1 tablet by mouth daily.   . Omega-3 Fatty Acids (FISH OIL) 1000 MG CAPS Take 1,000 mg by mouth daily.   . predniSONE (DELTASONE) 5 MG tablet TAKE 1 TABLET BY MOUTH WITH BREAKFAST  . Propylene Glycol (SYSTANE BALANCE) 0.6 % SOLN Place 1 drop into both eyes 2 (two) times daily as needed (dry eyes).   . rifaximin (XIFAXAN) 550 MG TABS tablet Take 1 tablet (550 mg total) by mouth 3 (three) times daily.  . traMADol (ULTRAM) 50 MG tablet Take by mouth 2 (two) times daily as needed.  . urea (CARMOL) 10 % cream Apply topically as needed.  . [DISCONTINUED] doxycycline (VIBRA-TABS) 100 MG tablet Take 1 tablet (100 mg total) by mouth 2 (two) times daily.  . [DISCONTINUED] loratadine (CLARITIN) 10 MG tablet Take 1 tablet (10 mg total) by mouth daily for 14 days.   No facility-administered encounter medications on file as of 10/28/2018.     Review of Systems:  Review of Systems  Constitutional: Positive for chills and malaise/fatigue. Negative for fever and weight loss.  HENT: Positive for congestion, ear pain, hearing loss and sore throat.   Eyes: Negative for blurred vision.  Respiratory: Positive for cough and sputum production. Negative for shortness of breath and wheezing.   Cardiovascular: Negative for chest pain,  palpitations and leg swelling.  Gastrointestinal: Negative for abdominal pain, blood in stool, constipation, diarrhea and melena.  Genitourinary: Negative for dysuria.  Musculoskeletal: Positive for back pain and joint pain. Negative for falls.  Skin: Negative for itching and rash.  Neurological: Positive for headaches. Negative for loss of consciousness.  Psychiatric/Behavioral: Negative for depression and memory loss. The patient is not nervous/anxious and does not have insomnia.     Health Maintenance  Topic Date Due  . Hepatitis C Screening  1946-01-24  . TETANUS/TDAP  09/05/2015  . MAMMOGRAM  02/19/2020  . COLONOSCOPY  04/14/2025  . INFLUENZA VACCINE  Completed  . DEXA SCAN  Completed  . PNA vac Low Risk Adult  Completed    Physical Exam: Vitals:   10/28/18 1040  BP: 120/70  Pulse: 80  Temp: 98.1 F (36.7 C)  TempSrc: Oral  SpO2: 95%  Weight: 107 lb (48.5 kg)  Height: 5' (1.524 m)   Body mass index is 20.9 kg/m. Physical Exam Vitals signs reviewed.  Constitutional:      General: She is not in acute distress.    Appearance: Normal appearance. She is not ill-appearing or toxic-appearing.  HENT:     Head: Normocephalic and atraumatic.     Right Ear: There is impacted cerumen.     Left Ear: There is impacted cerumen.     Ears:     Comments: HOH with cerumen in place Neck:     Musculoskeletal: Normal range of motion. Muscular tenderness present.  Cardiovascular:     Rate and Rhythm: Normal rate and regular rhythm.     Pulses: Normal pulses.     Heart sounds: Normal heart sounds.  Pulmonary:     Effort: Pulmonary effort is normal.     Breath sounds: No wheezing, rhonchi or rales.  Abdominal:     General: Bowel sounds are normal.  Musculoskeletal: Normal range of motion.        General: Deformity present.     Comments: Fingers deformed from RA  Lymphadenopathy:     Cervical: Cervical adenopathy present.  Skin:    General: Skin is warm and dry.    Neurological:     General: No focal deficit present.     Mental Status: She is alert and oriented to person, place, and time.  Psychiatric:        Mood and Affect: Mood normal.        Behavior: Behavior normal.        Thought Content: Thought content normal.        Judgment: Judgment normal.     Labs reviewed: Basic Metabolic Panel: Recent Labs    12/03/17 0934 12/13/17 0635 02/12/18 0840  NA 139 137 139  K 4.2 3.3* 3.8  CL 101 101 101  CO2 29 26 31   GLUCOSE 96 151* 86  BUN 29* 19 24  CREATININE 0.85 0.72 0.60  CALCIUM 8.9 8.0* 8.5*   Liver Function Tests: Recent Labs    02/12/18 0840  AST 13  ALT 8  BILITOT 0.3  PROT 6.6   No results for input(s): LIPASE, AMYLASE in the last 8760 hours. No results for input(s): AMMONIA in the last 8760 hours. CBC: Recent Labs    12/03/17 0934 12/13/17 0635 02/12/18 0840  WBC 12.8* 19.5* 10.7  NEUTROABS  --   --  6,227  HGB 12.0 9.2* 10.5*  HCT 38.2 28.0* 32.1*  MCV 98.7 96.6 87.0  PLT 330 246 359   Lipid Panel: Recent Labs    02/12/18 0840  CHOL 150  HDL 59  LDLCALC 72  TRIG 111  CHOLHDL 2.5   No results found for: HGBA1C  Procedures since last visit: No results found.  Assessment/Plan 1. 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 Rheumatoid arthritis with positive rheumatoid factor, involving unspecified site ([place]) -on  orencia infusions so at risk for infections -tx with abx sooner due to this  3. Bilateral hearing loss due to cerumen impaction -flushed with warm water and peroxide  today  Labs/tests ordered:  No orders of the defined types were placed in this encounter. for labs before appt  Next appt:  11/05/2018 Has AWV and CPE with Dinah NP  Jaleeya Mcnelly L. Hayven Croy, D.O. [facility] [address], [place] [address] Cell Phone (Mon-Fri 8am-5pm):  [phone] On Call:  [phone] & follow prompts after 5pm & weekends Office Phone:  [phone] Office Fax:  [phone]

## 2018-10-28 NOTE — Patient Instructions (Addendum)
Drink plenty of water.  Use salt water gargles and chloraseptic for the sore throat.  coracidin bp is good for the coughing.  If your mucus gets thick in your chest, you may take mucinex dm syrup or capsules.  Warm humidity is good for congestion, too.  Get plenty of rest.  Call me back if you get a fever or chest tightness.    Laryngitis  Laryngitis is inflammation of the vocal cords that causes symptoms such as hoarseness or loss of voice. The vocal cords are two bands of muscles in your throat. When you speak, these cords come together and vibrate. The vibrations come out through your mouth as sound. When your vocal cords are inflamed, your voice sounds different. Laryngitis can be temporary (acute) or long-term (chronic). Most cases of acute laryngitis improve with time. Chronic laryngitis is laryngitis that lasts for more than 3 weeks. What are the causes? Acute laryngitis may be caused by:  A viral infection.  Lots of talking, yelling, or singing. This is also called vocal strain.  A bacterial infection. Chronic laryngitis may be caused by:  Vocal strain.  Injury to your vocal cords.  Acid reflux (gastroesophageal reflux disease, or GERD).  Allergies.  A sinus infection.  Smoking.  Alcohol abuse.  Breathing in chemicals or dust.  Growths on the vocal cords. What increases the risk? The following factors may make you more likely to develop this condition:  Smoking.  Alcohol abuse.  Having allergies.  Chronic irritants in the workplace, such as toxic fumes. What are the signs or symptoms? Symptoms of this condition may include:  Low, hoarse voice.  Loss of voice.  Dry cough.  Sore or dry throat.  Stuffy nose. How is this diagnosed? This condition may be diagnosed based on:  Your symptoms and a physical exam.  Throat culture.  Blood test.  A procedure in which your health care provider looks at your vocal cords with a mirror or viewing tube  (laryngoscopy). How is this treated? Treatment for laryngitis depends on what is causing it.  Usually, treatment involves resting your voice and using medicines to soothe your throat.  If your laryngitis is caused by a bacterial infection, you may need to take antibiotic medicine.  If your laryngitis is caused by a growth, you may need to have a procedure to remove it. Follow these instructions at home: Medicines  Take over-the-counter and prescription medicines only as told by your health care provider.  If you were prescribed an antibiotic medicine, take it as told by your health care provider. Do not stop taking the antibiotic even if you start to feel better. General instructions  Talk as little as possible. Also avoid whispering, which can cause vocal strain.  Write instead of talking. Do this until your voice is back to normal.  Drink enough fluid to keep your urine pale yellow.  Breathe in moist air. Use a humidifier if you live in a dry climate.  Do not use any products that contain nicotine or tobacco, such as cigarettes and e-cigarettes. If you need help quitting, ask your health care provider. Contact a health care provider if:  You have a fever.  You have increasing pain.  Your symptoms do not get better in 2 weeks. Get help right away if:  You cough up blood.  You have difficulty swallowing.  You have trouble breathing. Summary  Laryngitis is inflammation of the vocal cords that causes symptoms such as hoarseness or loss of voice.  Laryngitis can be temporary (acute) or long-term (chronic).  Treatment for laryngitis depends on the cause. It often involves resting your voice and using medicine to soothe your throat. This information is not intended to replace advice given to you by your health care provider. Make sure you discuss any questions you have with your health care provider. Document Released: 08/21/2005 Document Revised: 08/08/2017 Document  Reviewed: 08/08/2017 Elsevier Interactive Patient Education  2019 [employer].

## 2018-10-29 ENCOUNTER — Encounter: Payer: Self-pay | Admitting: *Deleted

## 2018-10-29 LAB — CBC WITH DIFFERENTIAL/PLATELET
Absolute Monocytes: 1000 cells/uL — ABNORMAL HIGH (ref 200–950)
Basophils Absolute: 32 cells/uL (ref 0–200)
Basophils Relative: 0.4 %
Eosinophils Absolute: 32 cells/uL (ref 15–500)
Eosinophils Relative: 0.4 %
HCT: 36.4 % (ref 35.0–45.0)
Hemoglobin: 11.8 g/dL (ref 11.7–15.5)
Lymphs Abs: 3328 cells/uL (ref 850–3900)
MCH: 27.9 pg (ref 27.0–33.0)
MCHC: 32.4 g/dL (ref 32.0–36.0)
MCV: 86.1 fL (ref 80.0–100.0)
MPV: 10.4 fL (ref 7.5–12.5)
Monocytes Relative: 12.5 %
Neutro Abs: 3608 cells/uL (ref 1500–7800)
Neutrophils Relative %: 45.1 %
Platelets: 327 10*3/uL (ref 140–400)
RBC: 4.23 10*6/uL (ref 3.80–5.10)
RDW: 12.6 % (ref 11.0–15.0)
Total Lymphocyte: 41.6 %
WBC: 8 10*3/uL (ref 3.8–10.8)

## 2018-10-29 LAB — COMPLETE METABOLIC PANEL WITH GFR
AG Ratio: 1.4 (calc) (ref 1.0–2.5)
ALT: 11 U/L (ref 6–29)
AST: 17 U/L (ref 10–35)
Albumin: 3.6 g/dL (ref 3.6–5.1)
Alkaline phosphatase (APISO): 51 U/L (ref 37–153)
BUN: 18 mg/dL (ref 7–25)
CO2: 31 mmol/L (ref 20–32)
Calcium: 8.5 mg/dL — ABNORMAL LOW (ref 8.6–10.4)
Chloride: 101 mmol/L (ref 98–110)
Creat: 0.71 mg/dL (ref 0.60–0.93)
GFR, Est African American: 99 mL/min/{1.73_m2} (ref >=60)
GFR, Est Non African American: 86 mL/min/{1.73_m2} (ref >=60)
Globulin: 2.6 g/dL (calc) (ref 1.9–3.7)
Glucose, Bld: 83 mg/dL (ref 65–99)
Potassium: 3.8 mmol/L (ref 3.5–5.3)
Sodium: 139 mmol/L (ref 135–146)
Total Bilirubin: 0.2 mg/dL (ref 0.2–1.2)
Total Protein: 6.2 g/dL (ref 6.1–8.1)

## 2018-10-29 LAB — LIPID PANEL
Cholesterol: 168 mg/dL (ref <200)
HDL: 63 mg/dL (ref >=50)
LDL Cholesterol (Calc): 78 mg/dL (calc)
Non-HDL Cholesterol (Calc): 105 mg/dL (calc) (ref <130)
Total CHOL/HDL Ratio: 2.7 (calc) (ref <5.0)
Triglycerides: 178 mg/dL — ABNORMAL HIGH (ref <150)

## 2018-10-29 LAB — HEMOGLOBIN A1C
Hgb A1c MFr Bld: 5.7 % of total Hgb — ABNORMAL HIGH (ref <5.7)
Mean Plasma Glucose: 117 (calc)
eAG (mmol/L): 6.5 (calc)

## 2018-10-31 ENCOUNTER — Telehealth: Payer: Self-pay | Admitting: *Deleted

## 2018-10-31 ENCOUNTER — Encounter: Payer: Self-pay | Admitting: *Deleted

## 2018-10-31 NOTE — Telephone Encounter (Signed)
This is expected.  It may take a couple of weeks for this to resolve.

## 2018-10-31 NOTE — Telephone Encounter (Signed)
Patient called and stated that tomorrow is her last day of Zithromycin and she still has a runny nose. No other symptoms. Patient wants to know what she can take for this. Please advise.

## 2018-11-01 NOTE — Telephone Encounter (Signed)
Patient notified and agreed.  

## 2018-11-05 ENCOUNTER — Encounter: Payer: Self-pay | Admitting: Family

## 2018-11-05 ENCOUNTER — Ambulatory Visit (INDEPENDENT_AMBULATORY_CARE_PROVIDER_SITE_OTHER): Payer: Medicare Other | Admitting: Family

## 2018-11-05 VITALS — BP 112/80 | HR 75 | Temp 97.6°F | Ht 60.0 in | Wt 108.4 lb

## 2018-11-05 VITALS — BP 112/80 | HR 75 | Temp 97.7°F | Resp 18 | Ht 60.0 in | Wt 108.4 lb

## 2018-11-05 DIAGNOSIS — Z0000 Encounter for general adult medical examination without abnormal findings: Secondary | ICD-10-CM

## 2018-11-05 DIAGNOSIS — I10 Essential (primary) hypertension: Secondary | ICD-10-CM | POA: Diagnosis not present

## 2018-11-05 DIAGNOSIS — Z23 Encounter for immunization: Secondary | ICD-10-CM | POA: Diagnosis not present

## 2018-11-05 DIAGNOSIS — E781 Pure hyperglyceridemia: Secondary | ICD-10-CM

## 2018-11-05 DIAGNOSIS — R7303 Prediabetes: Secondary | ICD-10-CM

## 2018-11-05 DIAGNOSIS — M810 Age-related osteoporosis without current pathological fracture: Secondary | ICD-10-CM

## 2018-11-05 DIAGNOSIS — M059 Rheumatoid arthritis with rheumatoid factor, unspecified: Secondary | ICD-10-CM

## 2018-11-05 MED ORDER — TETANUS-DIPHTH-ACELL PERTUSSIS 5-2.5-18.5 LF-MCG/0.5 IM SUSP: 0.5000 mL | Freq: Once | INTRAMUSCULAR | 0 refills | Status: AC

## 2018-11-05 NOTE — Progress Notes (Signed)
Subjective:   Joan [name] is a 73 y.o. female who presents for Medicare Annual (Subsequent) preventive examination.  Review of Systems:   Cardiac Risk Factors include: advanced age (>75men, >56 women);hypertension     Objective:     Vitals: BP 112/80   Pulse 75   Temp 97.6 F (36.4 C) (Oral)   Ht 5' (1.524 m)   Wt 108 lb 6.4 oz (49.2 kg)   SpO2 97%   BMI 21.17 kg/m   Body mass index is 21.17 kg/m.  Advanced Directives 11/05/2018 11/05/2018 02/18/2018 12/03/2017 10/22/2017 08/02/2017 04/02/2017  Does Patient Have a Medical Advance Directive? No No No Yes No No No  Type of Advance Directive - - - [place] in Chart? - - - No - copy requested - - -  Would patient like information on creating a medical advance directive? No - Patient declined (No Data) No - Patient declined - Yes (MAU/Ambulatory/Procedural Areas - Information given) - No - Patient declined    Tobacco Social History   Tobacco Use  Smoking Status Never Smoker  Smokeless Tobacco Never Used     Counseling given: Not Answered   Clinical Intake:  Pre-visit preparation completed: No  Pain : No/denies pain     BMI - recorded: 21.17 Nutritional Status: BMI of 19-24  Normal Nutritional Risks: None Diabetes: No  How often do you need to have someone help you when you read instructions, pamphlets, or other written materials from your doctor or pharmacy?: 1 - Never What is the last grade level you completed in school?: 12 Grade   Interpreter Needed?: No  Information entered by :: Dinah ngetich FNP-C  Past Medical History:  Diagnosis Date  . Alopecia areata 02/16/2006  . Anxiety state, unspecified 2004  . Benign paroxysmal positional vertigo 04/01/03  . Cancer ([place])    skin   . Cervicalgia 04/01/03  . Chronic rhinitis 09/01/05  . Corns and callosities   . Dermatophytosis of nail   . Diarrhea 04/15/2015  . Diverticulosis   . Dysphagia,  unspecified(787.20) 2004  . Enthesopathy of ankle and tarsus, unspecified 03/31/96  . External hemorrhoids without mention of complication   . 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 Fibromyalgia   . Hypertension 2004  . Hypothyroidism   . Impacted cerumen    right ear  . Lumbago   . Myalgia and myositis, unspecified 03/01/05  . Nontoxic uninodular goiter   . Osteoarthrosis, unspecified whether generalized or localized, unspecified site 04/01/03  . Other left bundle branch block   . Rheumatoid arthritis(714.0) 10/05/2004   lumbar spondylosis. Lumbo-sacral anterolisthesis    . Senile osteoporosis 04/01/03  . Sprain of metatarsophalangeal (joint) of foot   . Thyrotoxicosis   . Thyrotoxicosis without mention of goiter or other cause, without mention of thyrotoxic crisis or storm   . Unspecified vitamin D deficiency   . Urinary frequency    Past Surgical History:  Procedure Laterality Date  . COLONOSCOPY    . COLONOSCOPY N/A 04/15/2015   Procedure: COLONOSCOPY;  Surgeon: Gatha Mayer, MD;  Location: [facility];  Service: Endoscopy;  Laterality: N/A;  . DG FINGERS, MULTIPLE LT HAND ([place] [facility])    . FOOT SURGERY Bilateral 05/22/2014   Dr.Strom   . LUMBAR DISC SURGERY  12/12/2017   Dr Arnoldo Morale  . [place]  DR QQPYPP   . TONSILLECTOMY AND ADENOIDECTOMY  1974  . TUBAL LIGATION  1973   Family History  Problem Relation Age of Onset  . Heart attack Father        age 7  . Heart disease Father    Social History   Socioeconomic History  . Marital status: Married    Spouse name: Percell Miller  . Number of children: 2  . Years of education: Not on file  . Highest education level: Not on file  Occupational History  . Occupation: retired  Scientific laboratory technician  . Financial resource strain: Not hard at all  . Food insecurity:    Worry: Never true    Inability: Never true  . Transportation needs:    Medical: No    Non-medical:  No  Tobacco Use  . Smoking status: Never Smoker  . Smokeless tobacco: Never Used  Substance and Sexual Activity  . Alcohol use: No    Alcohol/week: 0.0 standard drinks  . Drug use: No  . Sexual activity: Yes  Lifestyle  . Physical activity:    Days per week: 0 days    Minutes per session: 0 min  . Stress: Very much  Relationships  . Social connections:    Talks on phone: More than three times a week    Gets together: More than three times a week    Attends religious service: More than 4 times per year    Active member of club or organization: No    Attends meetings of clubs or organizations: Never    Relationship status: Married  Other Topics Concern  . Not on file  Social History Narrative  . Not on file    Outpatient Encounter Medications as of 11/05/2018  Medication Sig  . Abatacept (ORENCIA IV) Inject into the vein. Infusion every 2 weeks, initiated by Dr.Beekman  . acetaminophen (TYLENOL) 500 MG tablet Take 1,000 mg by mouth as needed for mild pain or moderate pain.   Marland Kitchen amitriptyline (ELAVIL) 10 MG tablet TAKE 1 TABLET BY MOUTH AT BEDTIME TO  HELP  RELAX  MUSCLES  . azithromycin (ZITHROMAX) 250 MG tablet 2 tablets today, then 1 tablet daily for 4 days  . Biotin 1000 MCG tablet Take 1 tablet (1 mg total) by mouth 2 (two) times daily.  . Calcium Carb-Cholecalciferol (CALTRATE 600+D3) 600-800 MG-UNIT TABS Take 1 tablet by mouth 2 (two) times daily.  . Cholecalciferol (VITAMIN D3) 50 MCG (2000 UT) capsule Take 1 capsule (2,000 Units total) by mouth daily.  . cycloSPORINE (RESTASIS) 0.05 % ophthalmic emulsion Place 1 drop into both eyes 2 (two) times daily.  . diphenoxylate-atropine (LOMOTIL) 2.5-0.025 MG tablet 1-2 every 8 hours as needed  . fluticasone (FLONASE) 50 MCG/ACT nasal spray Place 1 spray into both nostrils daily for 14 days.  . folic acid (FOLVITE) 1 MG tablet Take 1 mg by mouth daily.  . furosemide (LASIX) 40 MG tablet Take 40 mg by mouth as needed.  .  Glucosamine-Chondroit-Vit C-Mn (GLUCOSAMINE CHONDR 500 COMPLEX PO) Take 2 tablets by mouth daily.   . hydrochlorothiazide (HYDRODIURIL) 25 MG tablet Take 1 tablet (25 mg total) by mouth daily.  Marland Kitchen losartan (COZAAR) 100 MG tablet Take 1 tablet (100 mg total) by mouth daily.  . Menthol, Topical Analgesic, (ASPERCREME MAX ROLL-ON EX) Apply topically.  . Multiple Vitamin (MULTIVITAMIN) tablet Take 1 tablet by mouth daily.   . Omega-3 Fatty Acids (FISH OIL) 1000 MG CAPS Take 1,000 mg by mouth daily.   Marland Kitchen  predniSONE (DELTASONE) 5 MG tablet TAKE 1 TABLET BY MOUTH WITH BREAKFAST  . Propylene Glycol (SYSTANE BALANCE) 0.6 % SOLN Place 1 drop into both eyes 2 (two) times daily as needed (dry eyes).   . rifaximin (XIFAXAN) 550 MG TABS tablet Take 1 tablet (550 mg total) by mouth 3 (three) times daily.  . Tdap (BOOSTRIX) 5-2.5-18.5 LF-MCG/0.5 injection Inject 0.5 mLs into the muscle once for 1 dose.  . traMADol (ULTRAM) 50 MG tablet Take by mouth 2 (two) times daily as needed.  . urea (CARMOL) 10 % cream Apply topically as needed.  . [DISCONTINUED] Tdap (BOOSTRIX) 5-2.5-18.5 LF-MCG/0.5 injection Inject 0.5 mLs into the muscle once.   No facility-administered encounter medications on file as of 11/05/2018.     Activities of Daily Living In your present state of health, do you have any difficulty performing the following activities: 11/05/2018 12/03/2017  Hearing? N N  Vision? N N  Difficulty concentrating or making decisions? N N  Walking or climbing stairs? N Y  Dressing or bathing? N N  Doing errands, shopping? N -  Preparing Food and eating ? N -  Using the Toilet? N -  In the past six months, have you accidently leaked urine? N -  Do you have problems with loss of bowel control? N -  Managing your Medications? N -  Managing your Finances? N -  Housekeeping or managing your Housekeeping? N -  Some recent data might be hidden    Patient Care Team: Gayland Curry, DO as PCP - General (Geriatric  Medicine) Hennie Duos, MD as Consulting Physician (Rheumatology) Altheimer, Legrand Como, MD as Consulting Physician (Endocrinology) Newman Pies, MD as Consulting Physician (Neurosurgery) Clydell Hakim, MD as Consulting Physician (Anesthesiology) Rolla Flatten, MD as Consulting Physician (Diagnostic Radiology) Jolene Schimke, MD as Referring Physician (Dermatology) Lynnell Dike, OD as Consulting Physician (Optometry)    Assessment:   This is a routine wellness examination for Shaliah.  Exercise Activities and Dietary recommendations Current Exercise Habits: Home exercise routine, Type of exercise: walking, Time (Minutes): 30, Frequency (Times/Week): 3, Weekly Exercise (Minutes/Week): 90, Intensity: Mild  Goals    . Increase water intake     Starting 10/31/16, I will attempt to increase my water intake by 2 more glasses daily, for a total of 6 glasses per day.       Fall Risk Fall Risk  11/05/2018 11/05/2018 10/28/2018 09/10/2018 07/08/2018  Falls in the past year? 0 0 0 0 1  Number falls in past yr: 0 0 0 0 0  Comment - - - - -  Injury with Fall? 0 0 0 0 0  Follow up - - - - -   Is the patient's home free of loose throw rugs in walkways, pet beds, electrical cords, etc?   yes      Grab bars in the bathroom? no      Handrails on the stairs?   no      Adequate lighting?   yes  Depression Screen PHQ 2/9 Scores 11/05/2018 10/28/2018 07/08/2018 02/18/2018  PHQ - 2 Score 0 0 0 0     Cognitive Function MMSE - Mini Mental State Exam 11/05/2018 10/22/2017 10/31/2016 10/31/2016  Orientation to time 5 5 5 5   Orientation to Place 5 5 5 5   Registration 3 3 3  -  Attention/ Calculation 5 5 5  -  Recall 2 2 2  -  Language- name 2 objects 2 2 2  -  Language- repeat 1 1  1 -  Language- follow 3 step command 3 3 2  -  Language- read & follow direction 1 1 1  -  Write a sentence 1 1 1  -  Copy design 1 1 1  -  Total score 29 29 28  -        Immunization History  Administered Date(s) Administered    . Influenza, High Dose Seasonal PF 06/25/2017, 07/08/2018  . Influenza-Unspecified 06/04/2013, 06/24/2014, 07/06/2015, 05/05/2016, 06/13/2016  . Pneumococcal Conjugate-13 10/06/2015  . Pneumococcal Polysaccharide-23 10/31/2016  . Pneumococcal-Unspecified 09/04/2006  . Td 09/04/2005  . Zoster 11/10/2008  . Zoster Recombinat (Shingrix) 02/19/2018, 07/15/2018    Qualifies for Shingles Vaccine? Up to date   Screening Tests Health Maintenance  Topic Date Due  . Hepatitis C Screening  1946/03/26  . TETANUS/TDAP  09/05/2015  . MAMMOGRAM  02/19/2020  . COLONOSCOPY  04/14/2025  . INFLUENZA VACCINE  Completed  . DEXA SCAN  Completed  . PNA vac Low Risk Adult  Completed    Cancer Screenings: Lung: Low Dose CT Chest recommended if Age 68-80 years, 30 pack-year currently smoking OR have quit w/in 15years. Patient does not qualify. Breast:  Up to date on Mammogram? Yes   Up to date of Bone Density/Dexa? Yes Colorectal:Up to date   Additional Screenings:  Hepatitis C Screening: low risk      Plan:   Low carbohydrate,low saturated fats.high vegetable diet and exercise.   I have personally reviewed and noted the following in the patient's chart:   . Medical and social history . Use of alcohol, tobacco or illicit drugs  . Current medications and supplements . Functional ability and status . Nutritional status . Physical activity . Advanced directives . List of other physicians . Hospitalizations, surgeries, and ER visits in previous 12 months . Vitals . Screenings to include cognitive, depression, and falls . Referrals and appointments  In addition, I have reviewed and discussed with patient certain preventive protocols, quality metrics, and best practice recommendations. A written personalized care plan for preventive services as well as general preventive health recommendations were provided to patient.   Sandrea Hughs, NP  11/05/2018

## 2018-11-05 NOTE — Progress Notes (Signed)
Provider: Alaena Strader FNP-C   Gayland Curry, DO  Patient Care Team: Gayland Curry, DO as PCP - General (Geriatric Medicine) Hennie Duos, MD as Consulting Physician (Rheumatology) Altheimer, Legrand Como, MD as Consulting Physician (Endocrinology) Newman Pies, MD as Consulting Physician (Neurosurgery) Clydell Hakim, MD as Consulting Physician (Anesthesiology) Rolla Flatten, MD as Consulting Physician (Diagnostic Radiology) Jolene Schimke, MD as Referring Physician (Dermatology) Lynnell Dike, OD as Consulting Physician (Optometry)  Extended Emergency Contact Information Primary Emergency Contact: Delila Pereyra of [place] Phone: [phone] Relation: Son Secondary Emergency Contact: Burgess Estelle of [place] Phone: [phone] Mobile Phone: [phone] Relation: Spouse  Goals of care: Advanced Directive information Advanced Directives 11/05/2018  Does Patient Have a Medical Advance Directive? No  Type of Advance Directive -  Copy of [place] in Chart? -  Would patient like information on creating a medical advance directive? No - Patient declined     Chief Complaint  Patient presents with  . Annual Exam    Yearly Physical and discuss labs. Patient would like to discuss cough.     HPI:  Pt is a 73 y.o. female seen today for annual physical exam.she states was seen 10/28/2018 by Dr.Reed for a cough and Z-pak was ordered.she has completed medication as directed but still has a cough.she states runny nose has improved.she states coughs up yellow mucus at times. She denies any fever,chills,wheezing or shortness of breath.  Recent lab work reviewed Hgb A1C 5.7 consistent with prediabetes,TRG 178.low carbohydrates,low saturated fats and high vegetable diet and Exercise and increased of physical activity discussed with patient.  Hypertension - on losartan 100 mg tablet daily and HCZT 25 mg tablet daily.she  denies any headache,dizziness,faintness or chest pain.  Osteoporosis - calcium with Vit D twice daily.Recent calcium level 8.5    RA - On prednisone 5 mg tablet daily,abatacept infusion every 2 weeks  And tramadol 50 mg tablet twice daily as needed.   Immunization - reviewed up to date.  Exercise - walks three times per week for at least 30 minutes.walking described as mild intensity due to hx back surgery.    Past Medical History:  Diagnosis Date  . Alopecia areata 02/16/2006  . Anxiety state, unspecified 2004  . Benign paroxysmal positional vertigo 04/01/03  . Cancer ([place])    skin   . Cervicalgia 04/01/03  . Chronic rhinitis 09/01/05  . Corns and callosities   . Dermatophytosis of nail   . Diarrhea 04/15/2015  . Diverticulosis   . Dysphagia, unspecified(787.20) 2004  . Enthesopathy of ankle and tarsus, unspecified 03/31/96  . External hemorrhoids without mention of complication   . 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 Fibromyalgia   . Hypertension 2004  . Hypothyroidism   . Impacted cerumen    right ear  . Lumbago   . Myalgia and myositis, unspecified 03/01/05  . Nontoxic uninodular goiter   . Osteoarthrosis, unspecified whether generalized or localized, unspecified site 04/01/03  . Other left bundle branch block   . Rheumatoid arthritis(714.0) 10/05/2004   lumbar spondylosis. Lumbo-sacral anterolisthesis    . Senile osteoporosis 04/01/03  . Sprain of metatarsophalangeal (joint) of foot   . Thyrotoxicosis   . Thyrotoxicosis without mention of goiter or other cause, without mention of thyrotoxic crisis or storm   . Unspecified vitamin D deficiency   . Urinary frequency    Past Surgical History:  Procedure Laterality Date  . COLONOSCOPY    . COLONOSCOPY N/A  04/15/2015   Procedure: COLONOSCOPY;  Surgeon: Gatha Mayer, MD;  Location: [facility];  Service: Endoscopy;  Laterality: N/A;  . DG FINGERS, MULTIPLE LT HAND ([facility])    . FOOT SURGERY Bilateral 05/22/2014   Dr.Strom    . LUMBAR DISC SURGERY  12/12/2017   Dr Arnoldo Morale  . [place]   DR [place]   . TONSILLECTOMY AND ADENOIDECTOMY  1974  . TUBAL LIGATION  1973    Allergies  Allergen Reactions  . Arava [Leflunomide] Diarrhea, Nausea Only and Other (See Comments)    Loss appetite  . Penicillins Rash and Other (See Comments)    PATIENT HAS HAD A PCN REACTION WITH IMMEDIATE RASH, FACIAL/TONGUE/THROAT SWELLING, SOB, OR LIGHTHEADEDNESS WITH HYPOTENSION:  #  #  YES  #  #  Has patient had a PCN reaction causing severe rash involving mucus membranes or skin necrosis: no Has patient had a PCN reaction that required hospitalization: no   . Methotrexate Derivatives Other (See Comments)    Hair loss  . Neurontin [Gabapentin] Nausea And Vomiting    Allergies as of 11/05/2018      Reactions   Arava [leflunomide] Diarrhea, Nausea Only, Other (See Comments)   Loss appetite   Penicillins Rash, Other (See Comments)   PATIENT HAS HAD A PCN REACTION WITH IMMEDIATE RASH, FACIAL/TONGUE/THROAT SWELLING, SOB, OR LIGHTHEADEDNESS WITH HYPOTENSION:  #  #  YES  #  #  Has patient had a PCN reaction causing severe rash involving mucus membranes or skin necrosis: no Has patient had a PCN reaction that required hospitalization: no   Methotrexate Derivatives Other (See Comments)   Hair loss   Neurontin [gabapentin] Nausea And Vomiting      Medication List       Accurate as of November 05, 2018 10:11 AM. Always use your most recent med list.        amitriptyline 10 MG tablet Commonly known as:  ELAVIL TAKE 1 TABLET BY MOUTH AT BEDTIME TO  HELP  RELAX  MUSCLES   ASPERCREME MAX ROLL-ON EX Apply topically.   azithromycin 250 MG tablet Commonly known as:  ZITHROMAX 2 tablets today, then 1 tablet daily for 4 days   Biotin 1000 MCG tablet Take 1 tablet (1 mg total) by mouth 2 (two) times daily.   Calcium Carb-Cholecalciferol 600-800 MG-UNIT  Tabs Commonly known as:  CALTRATE 600+D3 Take 1 tablet by mouth 2 (two) times daily.   cycloSPORINE 0.05 % ophthalmic emulsion Commonly known as:  RESTASIS Place 1 drop into both eyes 2 (two) times daily.   diphenoxylate-atropine 2.5-0.025 MG tablet Commonly known as:  LOMOTIL 1-2 every 8 hours as needed   Fish Oil 1000 MG Caps Take 1,000 mg by mouth daily.   fluticasone 50 MCG/ACT nasal spray Commonly known as:  FLONASE Place 1 spray into both nostrils daily for 14 days.   folic acid 1 MG tablet Commonly known as:  FOLVITE Take 1 mg by mouth daily.   furosemide 40 MG tablet Commonly known as:  LASIX Take 40 mg by mouth as needed.   GLUCOSAMINE CHONDR 500 COMPLEX PO Take 2 tablets by mouth daily.   hydrochlorothiazide 25 MG tablet Commonly known as:  HYDRODIURIL Take 1 tablet (25 mg total) by mouth daily.   losartan 100 MG tablet Commonly known as:  COZAAR Take 1 tablet (100 mg total) by mouth daily.   multivitamin  tablet Take 1 tablet by mouth daily.   ORENCIA IV Inject into the vein. Infusion every 2 weeks, initiated by Dr.Beekman   predniSONE 5 MG tablet Commonly known as:  DELTASONE TAKE 1 TABLET BY MOUTH WITH BREAKFAST   rifaximin 550 MG Tabs tablet Commonly known as:  XIFAXAN Take 1 tablet (550 mg total) by mouth 3 (three) times daily.   SYSTANE BALANCE 0.6 % Soln Generic drug:  Propylene Glycol Place 1 drop into both eyes 2 (two) times daily as needed (dry eyes).   Tdap 5-2.5-18.5 LF-MCG/0.5 injection Commonly known as:  BOOSTRIX Inject 0.5 mLs into the muscle once for 1 dose.   traMADol 50 MG tablet Commonly known as:  ULTRAM Take by mouth 2 (two) times daily as needed.   TYLENOL 500 MG tablet Generic drug:  acetaminophen Take 1,000 mg by mouth as needed for mild pain or moderate pain.   urea 10 % cream Commonly known as:  CARMOL Apply topically as needed.   Vitamin D3 50 MCG (2000 UT) capsule Take 1 capsule (2,000 Units total) by mouth  daily.       Review of Systems  Constitutional: Negative for appetite change, chills, fatigue, fever and unexpected weight change.  HENT: Negative for congestion, rhinorrhea, sinus pressure, sinus pain, sneezing and sore throat.   Eyes: Negative for pain, discharge, redness, itching and visual disturbance.  Respiratory: Negative for cough, chest tightness, shortness of breath and wheezing.   Cardiovascular: Negative for chest pain, palpitations and leg swelling.  Gastrointestinal: Negative for abdominal distention, abdominal pain, constipation, diarrhea, nausea and vomiting.  Endocrine: Negative for cold intolerance, heat intolerance, polydipsia, polyphagia and polyuria.  Genitourinary: Negative for dysuria, flank pain, frequency and urgency.  Musculoskeletal: Positive for arthralgias and back pain.  Skin: Negative for color change, pallor, rash and wound.  Neurological: Negative for dizziness, weakness, light-headedness, numbness and headaches.  Hematological: Does not bruise/bleed easily.  Psychiatric/Behavioral: Negative for agitation and sleep disturbance. The patient is not nervous/anxious.     Immunization History  Administered Date(s) Administered  . Influenza, High Dose Seasonal PF 06/25/2017, 07/08/2018  . Influenza-Unspecified 06/04/2013, 06/24/2014, 07/06/2015, 05/05/2016, 06/13/2016  . Pneumococcal Conjugate-13 10/06/2015  . Pneumococcal Polysaccharide-23 10/31/2016  . Pneumococcal-Unspecified 09/04/2006  . Td 09/04/2005  . Zoster 11/10/2008  . Zoster Recombinat (Shingrix) 02/19/2018, 07/15/2018   Pertinent  Health Maintenance Due  Topic Date Due  . MAMMOGRAM  02/19/2020  . COLONOSCOPY  04/14/2025  . INFLUENZA VACCINE  Completed  . DEXA SCAN  Completed  . PNA vac Low Risk Adult  Completed   Fall Risk  11/05/2018 11/05/2018 10/28/2018 09/10/2018 07/08/2018  Falls in the past year? 0 0 0 0 1  Number falls in past yr: 0 0 0 0 0  Comment - - - - -  Injury with Fall? 0 0 0  0 0  Follow up - - - - -    Vitals:   11/05/18 0919  BP: 112/80  Pulse: 75  Temp: 97.7 F (36.5 C)  TempSrc: Oral  SpO2: 97%  Weight: 108 lb 6.4 oz (49.2 kg)  Height: 5' (1.524 m)   Body mass index is 21.17 kg/m. Physical Exam Constitutional:      General: She is not in acute distress.    Appearance: She is normal weight.  HENT:     Head: Normocephalic.     Right Ear: Tympanic membrane, ear canal and external ear normal. There is no impacted cerumen.     Left Ear: Tympanic  membrane, ear canal and external ear normal. There is no impacted cerumen.     Nose: Nose normal. No congestion or rhinorrhea.     Mouth/Throat:     Mouth: Mucous membranes are moist.     Pharynx: Oropharynx is clear. No oropharyngeal exudate or posterior oropharyngeal erythema.  Eyes:     General: No scleral icterus.       Right eye: No discharge.        Left eye: No discharge.     Extraocular Movements: Extraocular movements intact.     Conjunctiva/sclera: Conjunctivae normal.     Pupils: Pupils are equal, round, and reactive to light.  Neck:     Musculoskeletal: Normal range of motion. No neck rigidity or muscular tenderness.     Vascular: No carotid bruit.  Cardiovascular:     Rate and Rhythm: Normal rate and regular rhythm.     Pulses: Normal pulses.     Heart sounds: No murmur. No friction rub. No gallop.   Pulmonary:     Effort: Pulmonary effort is normal. No respiratory distress.     Breath sounds: Normal breath sounds. No wheezing, rhonchi or rales.  Chest:     Chest wall: No tenderness.  Abdominal:     General: Bowel sounds are normal. There is no distension.     Palpations: Abdomen is soft. There is no mass.     Tenderness: There is no abdominal tenderness. There is no right CVA tenderness, left CVA tenderness, guarding or rebound.  Genitourinary:    Comments: Declined pap and breast exam  Musculoskeletal: Normal range of motion.        General: No swelling or tenderness.     Right  lower leg: No edema.     Left lower leg: No edema.  Lymphadenopathy:     Cervical: No cervical adenopathy.  Skin:    General: Skin is warm and dry.     Coloration: Skin is not pale.     Findings: No erythema or rash.  Neurological:     Mental Status: She is alert and oriented to person, place, and time.     Cranial Nerves: No cranial nerve deficit.     Sensory: No sensory deficit.     Motor: No weakness.     Coordination: Coordination normal.     Gait: Gait normal.  Psychiatric:        Mood and Affect: Mood normal.        Behavior: Behavior normal.        Thought Content: Thought content normal.        Judgment: Judgment normal.     Labs reviewed: Recent Labs    12/13/17 0635 02/12/18 0840 10/28/18 1128  NA 137 139 139  K 3.3* 3.8 3.8  CL 101 101 101  CO2 26 31 31   GLUCOSE 151* 86 83  BUN 19 24 18   CREATININE 0.72 0.60 0.71  CALCIUM 8.0* 8.5* 8.5*   Recent Labs    02/12/18 0840 10/28/18 1128  AST 13 17  ALT 8 11  BILITOT 0.3 0.2  PROT 6.6 6.2   Recent Labs    12/13/17 0635 02/12/18 0840 10/28/18 1128  WBC 19.5* 10.7 8.0  NEUTROABS  --  6,227 3,608  HGB 9.2* 10.5* 11.8  HCT 28.0* 32.1* 36.4  MCV 96.6 87.0 86.1  PLT 246 359 327   Lab Results  Component Value Date   TSH 3.90 10/21/2018   Lab Results  Component Value Date  HGBA1C 5.7 (H) 10/28/2018   Lab Results  Component Value Date   CHOL 168 10/28/2018   HDL 63 10/28/2018   LDLCALC 78 10/28/2018   TRIG 178 (H) 10/28/2018   CHOLHDL 2.7 10/28/2018    Significant Diagnostic Results in last 30 days:  No results found.  Assessment/Plan 1. Physical exam  up to date with immunization.Medication and recent lab work reviewed.Patient counseled regarding yearly exam,prevention,diet and regular sustained exercise for at least 30 minutes three times a week.Fall precaution.Scored 29/30 on MMSE.Depression stable on current medication. - Lipid panel; Future - Hemoglobin A1c; Future - CBC with  Differential/Platelet; Future - TSH; Future - CMP with eGFR([facility]); Future  2. Essential hypertension B/p stable.continue on losartan 100 mg tablet daily and HCZT 25 mg tablet daily. - CBC with Differential/Platelet; Future - TSH; Future - CMP with eGFR([facility]); Future  3. Senile osteoporosis Continue on calcium with Vit D twice daily. Dexa scan up to date.No recent fall episode or fractures.   4. Rheumatoid arthritis with positive rheumatoid factor, involving unspecified site (HCC) Continue on  prednisone 5 mg tablet daily,abatacept infusion every 2 weeks  And tramadol 50 mg tablet twice daily as needed.continue to follow up with Rheumatology   5. Prediabetes Lab Results  Component Value Date   HGBA1C 5.7 (H) 10/28/2018  Recommend low carbohydrates,low saturated fats and high vegetable diet and Exercise and increased of physical activity. - Hemoglobin A1c; Future  6. Hypertriglyceridemia TRG 178 low carbohydrates,low saturated fats and high vegetable diet and Exercise and increased of physical activity recommended. - Lipid panel; Future  Family/ staff Communication: Reviewed plan of care with patient.  Labs/tests ordered:  - Lipid panel; Future - Hemoglobin A1c; Future - CBC with Differential/Platelet; Future - TSH; Future - CMP with eGFR([facility]); Future  Follow up: 6 months with Dr.Reed for medical management of chronic issues.   Sandrea Hughs, NP

## 2018-11-05 NOTE — Patient Instructions (Signed)
Joan [name] , Thank you for taking time to come for your Medicare Wellness Visit. I appreciate your ongoing commitment to your health goals. Please review the following plan we discussed and let me know if I can assist you in the future.   Screening recommendations/referrals: Colonoscopy: Up to date  Mammogram: up to date  Bone Density: up to date  Recommended yearly ophthalmology/optometry visit for glaucoma screening and checkup Recommended yearly dental visit for hygiene and checkup  Vaccinations: Influenza vaccine: Up to date  Pneumococcal vaccine: Up to date  Tdap vaccine : Up to date  Shingles vaccine: up to date    Advanced directives:Copy given today   Conditions/risks identified: Advance age > 42 years,Hypertension   Next appointment: 1 year    Preventive Care 50 Years and Older, Female Preventive care refers to lifestyle choices and visits with your health care provider that can promote health and wellness. What does preventive care include?  A yearly physical exam. This is also called an annual well check.  Dental exams once or twice a year.  Routine eye exams. Ask your health care provider how often you should have your eyes checked.  Personal lifestyle choices, including:  Daily care of your teeth and gums.  Regular physical activity.  Eating a healthy diet.  Avoiding tobacco and drug use.  Limiting alcohol use.  Practicing safe sex.  Taking low-dose aspirin every day.  Taking vitamin and mineral supplements as recommended by your health care provider. What happens during an annual well check? The services and screenings done by your health care provider during your annual well check will depend on your age, overall health, lifestyle risk factors, and family history of disease. Counseling  Your health care provider may ask you questions about your:  Alcohol use.  Tobacco use.  Drug use.  Emotional well-being.  Home and relationship  well-being.  Sexual activity.  Eating habits.  History of falls.  Memory and ability to understand (cognition).  Work and work Statistician.  Reproductive health. Screening  You may have the following tests or measurements:  Height, weight, and BMI.  Blood pressure.  Lipid and cholesterol levels. These may be checked every 5 years, or more frequently if you are over 61 years old.  Skin check.  Lung cancer screening. You may have this screening every year starting at age 76 if you have a 30-pack-year history of smoking and currently smoke or have quit within the past 15 years.  Fecal occult blood test (FOBT) of the stool. You may have this test every year starting at age 59.  Flexible sigmoidoscopy or colonoscopy. You may have a sigmoidoscopy every 5 years or a colonoscopy every 10 years starting at age 33.  Hepatitis C blood test.  Hepatitis B blood test.  Sexually transmitted disease (STD) testing.  Diabetes screening. This is done by checking your blood sugar (glucose) after you have not eaten for a while (fasting). You may have this done every 1-3 years.  Bone density scan. This is done to screen for osteoporosis. You may have this done starting at age 32.  Mammogram. This may be done every 1-2 years. Talk to your health care provider about how often you should have regular mammograms. Talk with your health care provider about your test results, treatment options, and if necessary, the need for more tests. Vaccines  Your health care provider may recommend certain vaccines, such as:  Influenza vaccine. This is recommended every year.  Tetanus, diphtheria, and acellular  pertussis (Tdap, Td) vaccine. You may need a Td booster every 10 years.  Zoster vaccine. You may need this after age 11.  Pneumococcal 13-valent conjugate (PCV13) vaccine. One dose is recommended after age 38.  Pneumococcal polysaccharide (PPSV23) vaccine. One dose is recommended after age  54. Talk to your health care provider about which screenings and vaccines you need and how often you need them. This information is not intended to replace advice given to you by your health care provider. Make sure you discuss any questions you have with your health care provider. Document Released: 09/17/2015 Document Revised: 05/10/2016 Document Reviewed: 06/22/2015 Elsevier Interactive Patient Education  2017 [place] Prevention in the Home Falls can cause injuries. They can happen to people of all ages. There are many things you can do to make your home safe and to help prevent falls. What can I do on the outside of my home?  Regularly fix the edges of walkways and driveways and fix any cracks.  Remove anything that might make you trip as you walk through a door, such as a raised step or threshold.  Trim any bushes or trees on the path to your home.  Use bright outdoor lighting.  Clear any walking paths of anything that might make someone trip, such as rocks or tools.  Regularly check to see if handrails are loose or broken. Make sure that both sides of any steps have handrails.  Any raised decks and porches should have guardrails on the edges.  Have any leaves, snow, or ice cleared regularly.  Use sand or salt on walking paths during winter.  Clean up any spills in your garage right away. This includes oil or grease spills. What can I do in the bathroom?  Use night lights.  Install grab bars by the toilet and in the tub and shower. Do not use towel bars as grab bars.  Use non-skid mats or decals in the tub or shower.  If you need to sit down in the shower, use a plastic, non-slip stool.  Keep the floor dry. Clean up any water that spills on the floor as soon as it happens.  Remove soap buildup in the tub or shower regularly.  Attach bath mats securely with double-sided non-slip rug tape.  Do not have throw rugs and other things on the floor that can make  you trip. What can I do in the bedroom?  Use night lights.  Make sure that you have a light by your bed that is easy to reach.  Do not use any sheets or blankets that are too big for your bed. They should not hang down onto the floor.  Have a firm chair that has side arms. You can use this for support while you get dressed.  Do not have throw rugs and other things on the floor that can make you trip. What can I do in the kitchen?  Clean up any spills right away.  Avoid walking on wet floors.  Keep items that you use a lot in easy-to-reach places.  If you need to reach something above you, use a strong step stool that has a grab bar.  Keep electrical cords out of the way.  Do not use floor polish or wax that makes floors slippery. If you must use wax, use non-skid floor wax.  Do not have throw rugs and other things on the floor that can make you trip. What can I do with my stairs?  Do not leave any items on the stairs.  Make sure that there are handrails on both sides of the stairs and use them. Fix handrails that are broken or loose. Make sure that handrails are as long as the stairways.  Check any carpeting to make sure that it is firmly attached to the stairs. Fix any carpet that is loose or worn.  Avoid having throw rugs at the top or bottom of the stairs. If you do have throw rugs, attach them to the floor with carpet tape.  Make sure that you have a light switch at the top of the stairs and the bottom of the stairs. If you do not have them, ask someone to add them for you. What else can I do to help prevent falls?  Wear shoes that:  Do not have high heels.  Have rubber bottoms.  Are comfortable and fit you well.  Are closed at the toe. Do not wear sandals.  If you use a stepladder:  Make sure that it is fully opened. Do not climb a closed stepladder.  Make sure that both sides of the stepladder are locked into place.  Ask someone to hold it for you, if  possible.  Clearly mark and make sure that you can see:  Any grab bars or handrails.  First and last steps.  Where the edge of each step is.  Use tools that help you move around (mobility aids) if they are needed. These include:  Canes.  Walkers.  Scooters.  Crutches.  Turn on the lights when you go into a dark area. Replace any light bulbs as soon as they burn out.  Set up your furniture so you have a clear path. Avoid moving your furniture around.  If any of your floors are uneven, fix them.  If there are any pets around you, be aware of where they are.  Review your medicines with your doctor. Some medicines can make you feel dizzy. This can increase your chance of falling. Ask your doctor what other things that you can do to help prevent falls. This information is not intended to replace advice given to you by your health care provider. Make sure you discuss any questions you have with your health care provider. Document Released: 06/17/2009 Document Revised: 01/27/2016 Document Reviewed: 09/25/2014 Elsevier Interactive Patient Education  2017 [employer].

## 2018-11-07 ENCOUNTER — Ambulatory Visit: Payer: Medicare Other | Admitting: Internal Medicine

## 2018-11-07 ENCOUNTER — Encounter: Payer: Medicare Other | Admitting: Nurse Practitioner

## 2018-11-07 ENCOUNTER — Encounter: Payer: Medicare Other | Admitting: Family

## 2018-11-07 ENCOUNTER — Telehealth: Payer: Self-pay | Admitting: *Deleted

## 2018-11-07 NOTE — Telephone Encounter (Signed)
Patient called concerned regarding being told at appointment on Tuesday with Dinah that she was Prediabetic. Patient wants Dr. Cyndi Lennert advise.  I reviewed the labwork and Dr. Mariea Clonts signed off on them with her recommendations. Read and explained these to patient and she agreed.

## 2018-11-08 ENCOUNTER — Telehealth: Payer: Self-pay | Admitting: *Deleted

## 2018-11-08 NOTE — Telephone Encounter (Signed)
Stacey at pharmacy notified and agreed.

## 2018-11-08 NOTE — Telephone Encounter (Signed)
[facility] called and stated that they received a Rx for TDAP Boostrix but they do NOT carry this. They do have TDAP Adacell and wants to know if this can be given instead. Please Advise.

## 2018-11-08 NOTE — Telephone Encounter (Signed)
May administer Tdap adacell

## 2018-11-11 ENCOUNTER — Other Ambulatory Visit: Payer: Self-pay | Admitting: Internal Medicine

## 2018-11-18 ENCOUNTER — Other Ambulatory Visit: Payer: Medicare Other

## 2018-11-25 ENCOUNTER — Encounter: Payer: Medicare Other | Admitting: Internal Medicine

## 2018-12-02 ENCOUNTER — Encounter: Payer: Medicare Other | Admitting: Internal Medicine

## 2018-12-27 ENCOUNTER — Telehealth: Payer: Self-pay | Admitting: Internal Medicine

## 2018-12-27 MED ORDER — DIPHENOXYLATE-ATROPINE 2.5-0.025 MG PO TABS: ORAL_TABLET | ORAL | 1 refills | Status: DC

## 2018-12-27 NOTE — Telephone Encounter (Signed)
rx refill faxed to the pharmacy. VM left for patient to pick up her meds

## 2018-12-31 ENCOUNTER — Other Ambulatory Visit: Payer: Self-pay | Admitting: Internal Medicine

## 2019-01-09 ENCOUNTER — Other Ambulatory Visit: Payer: Self-pay | Admitting: Internal Medicine

## 2019-01-09 DIAGNOSIS — I10 Essential (primary) hypertension: Secondary | ICD-10-CM

## 2019-01-10 ENCOUNTER — Other Ambulatory Visit: Payer: Self-pay | Admitting: Internal Medicine

## 2019-01-10 ENCOUNTER — Other Ambulatory Visit: Payer: Self-pay

## 2019-01-10 DIAGNOSIS — I10 Essential (primary) hypertension: Secondary | ICD-10-CM

## 2019-01-10 MED ORDER — LOSARTAN POTASSIUM 100 MG PO TABS: 100.0000 mg | ORAL_TABLET | Freq: Every day | ORAL | 1 refills | Status: DC

## 2019-01-10 MED ORDER — HYDROCHLOROTHIAZIDE 25 MG PO TABS: 25.0000 mg | ORAL_TABLET | Freq: Every day | ORAL | 1 refills | Status: DC

## 2019-01-10 NOTE — Telephone Encounter (Signed)
Patient requested refill on Losartan Pended Rx and sent to Dr. Mariea Clonts for approval due to [place].

## 2019-01-10 NOTE — Telephone Encounter (Signed)
Incoming refill request was for HCTZ 12.5 mg denied, patients current medication list indicates she is taking 25 mg daily   RX for HCTZ 25 mg sent to pharmacy as patient is due for a refill on that medication dose

## 2019-01-21 ENCOUNTER — Other Ambulatory Visit: Payer: Self-pay | Admitting: Internal Medicine

## 2019-01-21 DIAGNOSIS — Z1231 Encounter for screening mammogram for malignant neoplasm of breast: Secondary | ICD-10-CM

## 2019-03-14 ENCOUNTER — Encounter: Payer: Self-pay | Admitting: *Deleted

## 2019-03-14 ENCOUNTER — Ambulatory Visit
Admission: RE | Admit: 2019-03-14 | Discharge: 2019-03-14 | Disposition: A | Discharge status: Home / Self Care | Payer: Medicare Other | Source: Ambulatory Visit | Attending: Internal Medicine | Admitting: Internal Medicine

## 2019-03-14 DIAGNOSIS — Z1231 Encounter for screening mammogram for malignant neoplasm of breast: Secondary | ICD-10-CM

## 2019-04-05 IMAGING — XA Imaging study
1 series · 1 of 1 positions shown · non-contrast
Comparison: none

CLINICAL DATA: Lumbosacral spondylosis without myelopathy. RIGHT
leg radicular symptoms have recurred after lifting a heavy box.

[Series 1: ortho standard · 1 of 1 slices shown]
[im 1/1]
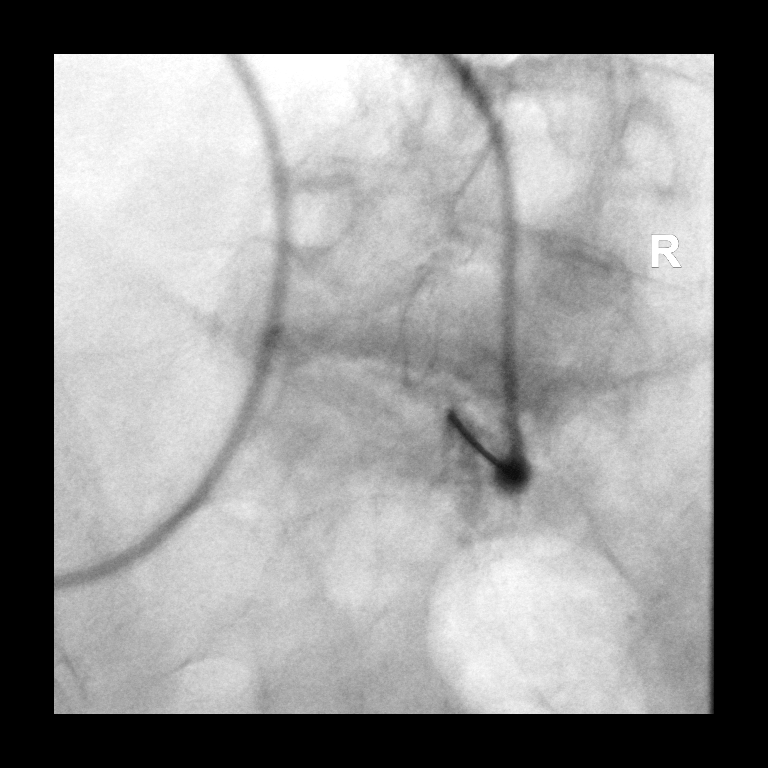

[1 of 1 positions shown; findings below may reference images not displayed]

FLUOROSCOPY TIME:  22 seconds corresponding to a Dose Area Product
of 32.02 ?Gy*m2

PROCEDURE:
The procedure, risks, benefits, and alternatives were explained to
the patient. Questions regarding the procedure were encouraged and
answered. The patient understands and consents to the procedure.

LUMBAR EPIDURAL INJECTION:

An interlaminar approach was performed on RIGHT at L5-S1. The
overlying skin was cleansed and anesthetized. A 20 gauge epidural
needle was advanced using loss-of-resistance technique.

DIAGNOSTIC EPIDURAL INJECTION:

Injection of Isovue-M 200 shows a good epidural pattern with spread
above and below the level of needle placement, primarily on the
RIGHT; no vascular opacification is seen.

THERAPEUTIC EPIDURAL INJECTION:

120 mg of Depo-Medrol mixed with 2 mL 1% lidocaine were instilled.
The procedure was well-tolerated, and the patient was discharged
thirty minutes following the injection in good condition.

COMPLICATIONS:
None.
IMPRESSION: Technically successful epidural injection on the RIGHT L5-S1,  # 2.

## 2019-04-10 ENCOUNTER — Other Ambulatory Visit: Payer: Self-pay | Admitting: Internal Medicine

## 2019-05-13 ENCOUNTER — Other Ambulatory Visit: Payer: Self-pay

## 2019-05-14 ENCOUNTER — Other Ambulatory Visit: Payer: Medicare Other

## 2019-05-14 ENCOUNTER — Other Ambulatory Visit: Payer: Self-pay

## 2019-05-14 DIAGNOSIS — Z0000 Encounter for general adult medical examination without abnormal findings: Secondary | ICD-10-CM

## 2019-05-15 ENCOUNTER — Telehealth: Payer: Self-pay | Admitting: Internal Medicine

## 2019-05-15 LAB — CBC WITH DIFFERENTIAL/PLATELET
Absolute Monocytes: 1809 cells/uL — ABNORMAL HIGH (ref 200–950)
Basophils Absolute: 36 cells/uL (ref 0–200)
Basophils Relative: 0.4 %
Eosinophils Absolute: 117 cells/uL (ref 15–500)
Eosinophils Relative: 1.3 %
HCT: 35.5 % (ref 35.0–45.0)
Hemoglobin: 11.6 g/dL — ABNORMAL LOW (ref 11.7–15.5)
Lymphs Abs: 2880 cells/uL (ref 850–3900)
MCH: 29.1 pg (ref 27.0–33.0)
MCHC: 32.7 g/dL (ref 32.0–36.0)
MCV: 89 fL (ref 80.0–100.0)
MPV: 10.3 fL (ref 7.5–12.5)
Monocytes Relative: 20.1 %
Neutro Abs: 4158 cells/uL (ref 1500–7800)
Neutrophils Relative %: 46.2 %
Platelets: 311 10*3/uL (ref 140–400)
RBC: 3.99 10*6/uL (ref 3.80–5.10)
RDW: 13.2 % (ref 11.0–15.0)
Total Lymphocyte: 32 %
WBC: 9 10*3/uL (ref 3.8–10.8)

## 2019-05-15 LAB — COMPLETE METABOLIC PANEL WITH GFR
AG Ratio: 1.4 (calc) (ref 1.0–2.5)
ALT: 9 U/L (ref 6–29)
AST: 11 U/L (ref 10–35)
Albumin: 3.4 g/dL — ABNORMAL LOW (ref 3.6–5.1)
Alkaline phosphatase (APISO): 44 U/L (ref 37–153)
BUN/Creatinine Ratio: 31 (calc) — ABNORMAL HIGH (ref 6–22)
BUN: 32 mg/dL — ABNORMAL HIGH (ref 7–25)
CO2: 31 mmol/L (ref 20–32)
Calcium: 8.6 mg/dL (ref 8.6–10.4)
Chloride: 102 mmol/L (ref 98–110)
Creat: 1.03 mg/dL — ABNORMAL HIGH (ref 0.60–0.93)
GFR, Est African American: 63 mL/min/{1.73_m2} (ref >=60)
GFR, Est Non African American: 55 mL/min/{1.73_m2} — ABNORMAL LOW (ref >=60)
Globulin: 2.4 g/dL (calc) (ref 1.9–3.7)
Glucose, Bld: 82 mg/dL (ref 65–99)
Potassium: 3.6 mmol/L (ref 3.5–5.3)
Sodium: 140 mmol/L (ref 135–146)
Total Bilirubin: 0.3 mg/dL (ref 0.2–1.2)
Total Protein: 5.8 g/dL — ABNORMAL LOW (ref 6.1–8.1)

## 2019-05-15 LAB — LIPID PANEL
Cholesterol: 143 mg/dL (ref <200)
HDL: 53 mg/dL (ref >=50)
LDL Cholesterol (Calc): 72 mg/dL (calc)
Non-HDL Cholesterol (Calc): 90 mg/dL (calc) (ref <130)
Total CHOL/HDL Ratio: 2.7 (calc) (ref <5.0)
Triglycerides: 95 mg/dL (ref <150)

## 2019-05-15 LAB — HEMOGLOBIN A1C
Hgb A1c MFr Bld: 5.3 % of total Hgb (ref <5.7)
Mean Plasma Glucose: 105 (calc)
eAG (mmol/L): 5.8 (calc)

## 2019-05-15 NOTE — Telephone Encounter (Signed)
Pt is scheduled for OV 10/8 and would like to know what medication to take for diarrhea.

## 2019-05-16 ENCOUNTER — Telehealth: Payer: Self-pay | Admitting: Internal Medicine

## 2019-05-16 MED ORDER — DIPHENOXYLATE-ATROPINE 2.5-0.025 MG PO TABS: ORAL_TABLET | ORAL | 2 refills | Status: DC

## 2019-05-16 NOTE — Telephone Encounter (Signed)
After speaking with Dr. Blanch Media nurse, I called patient and left a message on vm to make sure she is taking her Lomotil and that if she wanted she could alternate Lomotil and Imodium.

## 2019-05-16 NOTE — Telephone Encounter (Signed)
New lomotil rx sent to pharmacy

## 2019-05-19 ENCOUNTER — Other Ambulatory Visit: Payer: Self-pay

## 2019-05-19 ENCOUNTER — Ambulatory Visit: Payer: Medicare Other | Admitting: Internal Medicine

## 2019-05-22 ENCOUNTER — Other Ambulatory Visit: Payer: Self-pay

## 2019-05-22 ENCOUNTER — Ambulatory Visit (INDEPENDENT_AMBULATORY_CARE_PROVIDER_SITE_OTHER): Payer: Medicare Other | Admitting: Internal Medicine

## 2019-05-22 ENCOUNTER — Encounter: Payer: Self-pay | Admitting: Internal Medicine

## 2019-05-22 VITALS — BP 110/62 | HR 81 | Temp 99.2°F | Ht 60.0 in | Wt 103.0 lb

## 2019-05-22 DIAGNOSIS — M810 Age-related osteoporosis without current pathological fracture: Secondary | ICD-10-CM

## 2019-05-22 DIAGNOSIS — Z23 Encounter for immunization: Secondary | ICD-10-CM

## 2019-05-22 DIAGNOSIS — E039 Hypothyroidism, unspecified: Secondary | ICD-10-CM

## 2019-05-22 DIAGNOSIS — E781 Pure hyperglyceridemia: Secondary | ICD-10-CM

## 2019-05-22 DIAGNOSIS — N289 Disorder of kidney and ureter, unspecified: Secondary | ICD-10-CM | POA: Diagnosis not present

## 2019-05-22 DIAGNOSIS — K529 Noninfective gastroenteritis and colitis, unspecified: Secondary | ICD-10-CM

## 2019-05-22 DIAGNOSIS — R7303 Prediabetes: Secondary | ICD-10-CM

## 2019-05-22 LAB — BASIC METABOLIC PANEL WITH GFR
BUN: 25 mg/dL (ref 7–25)
CO2: 28 mmol/L (ref 20–32)
Calcium: 8.5 mg/dL — ABNORMAL LOW (ref 8.6–10.4)
Chloride: 103 mmol/L (ref 98–110)
Creat: 0.9 mg/dL (ref 0.60–0.93)
GFR, Est African American: 75 mL/min/{1.73_m2} (ref >=60)
GFR, Est Non African American: 64 mL/min/{1.73_m2} (ref >=60)
Glucose, Bld: 96 mg/dL (ref 65–139)
Potassium: 4.3 mmol/L (ref 3.5–5.3)
Sodium: 137 mmol/L (ref 135–146)

## 2019-05-22 MED ORDER — DENOSUMAB 60 MG/ML ~~LOC~~ SOSY: 60.0000 mg | PREFILLED_SYRINGE | SUBCUTANEOUS | 1 refills | Status: AC

## 2019-05-22 NOTE — Progress Notes (Signed)
Location:  [place] [facility]  Advanced Directives 11/05/2018  Does Patient Have a Medical Advance Directive? No  Type of Advance Directive -  Copy of [place] in Chart? -  Would patient like information on creating a medical advance directive? No - Patient declined     Chief Complaint  Patient presents with  . Medical Management of Chronic Issues    Routine followup, abnormal labs   . Immunizations    Needs flu shot and tetanus. Wants flu today.  Marland Kitchen Best Practice Recommendations    Hepatitis C screening    HPI: Patient is a 73 y.o. female seen today for medical management of chronic diseases.    Review of labs discussed with patient today.   She continues to struggle with diarrhea. Having multiple episodes throughout the day. Scheduled to see Dr. Henrene Pastor 06/12/2019. Follows a [employer] and takes metamucil daily. Denies any abdominal pain or nausea. Takes Lomotil daily, but does not know how much it is helping.She has also tried Xifaxan in the past, but does not know if the trials have helped reduce diarrhea. She states she is hydrating daily and drinks mainly water. She will drink diet coke or iced tea occasionally. Questions if artificial sweetners trigger episodes of diarrhea.   She continues to struggle with her RA. No recent flares. Able to ambulate and perform ADLs with no problems. No reported falls or injuries. She takes tramadol for the pain.   She saw Dr. Elyse Hsu in August. Labs were done. No new medications ordered. She is fatigued at times, but does not report any progression.   She is sleeping at least 6-8 hours a night.   Her last eye exam was in January 2020  Last dental exam 02/2019  Requesting flu shot today  Asking for Xifaxan samples       Past Medical History:  Diagnosis Date  . Alopecia areata 02/16/2006  . Anxiety state, unspecified 2004  . Benign paroxysmal positional vertigo 04/01/03  . Cancer ([place])    skin   . Cervicalgia 04/01/03   . Chronic rhinitis 09/01/05  . Corns and callosities   . Dermatophytosis of nail   . Diarrhea 04/15/2015  . Diverticulosis   . Dysphagia, unspecified(787.20) 2004  . Enthesopathy of ankle and tarsus, unspecified 03/31/96  . External hemorrhoids without mention of complication   . 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 Fibromyalgia   . Hypertension 2004  . Hypothyroidism   . Impacted cerumen    right ear  . Lumbago   . Myalgia and myositis, unspecified 03/01/05  . Nontoxic uninodular goiter   . Osteoarthrosis, unspecified whether generalized or localized, unspecified site 04/01/03  . Other left bundle branch block   . Rheumatoid arthritis(714.0) 10/05/2004   lumbar spondylosis. Lumbo-sacral anterolisthesis    . Senile osteoporosis 04/01/03  . Sprain of metatarsophalangeal (joint) of foot   . Thyrotoxicosis   . Thyrotoxicosis without mention of goiter or other cause, without mention of thyrotoxic crisis or storm   . Unspecified vitamin D deficiency   . Urinary frequency     Past Surgical History:  Procedure Laterality Date  . COLONOSCOPY    . COLONOSCOPY N/A 04/15/2015   Procedure: COLONOSCOPY;  Surgeon: Gatha Mayer, MD;  Location: [facility];  Service: Endoscopy;  Laterality: N/A;  . DG FINGERS, MULTIPLE LT HAND ([facility] HX)    . FOOT SURGERY Bilateral 05/22/2014   Dr.Strom   . LUMBAR DISC SURGERY  12/12/2017   Dr Arnoldo Morale  .  [place]   DR [place]   . TONSILLECTOMY AND ADENOIDECTOMY  1974  . TUBAL LIGATION  1973    Allergies  Allergen Reactions  . Arava [Leflunomide] Diarrhea, Nausea Only and Other (See Comments)    Loss appetite  . Penicillins Rash and Other (See Comments)    PATIENT HAS HAD A PCN REACTION WITH IMMEDIATE RASH, FACIAL/TONGUE/THROAT SWELLING, SOB, OR LIGHTHEADEDNESS WITH HYPOTENSION:  #  #  YES  #  #  Has patient had a PCN reaction causing severe rash involving mucus membranes or skin  necrosis: no Has patient had a PCN reaction that required hospitalization: no   . Methotrexate Derivatives Other (See Comments)    Hair loss  . Neurontin [Gabapentin] Nausea And Vomiting    Outpatient Encounter Medications as of 05/22/2019  Medication Sig  . acetaminophen (TYLENOL) 500 MG tablet Take 1,000 mg by mouth as needed for mild pain or moderate pain.   Marland Kitchen amitriptyline (ELAVIL) 10 MG tablet TAKE 1 TABLET BY MOUTH AT BEDTIME TO  HELP  RELAX  MUSCLES  . Biotin 1000 MCG tablet Take 1 tablet (1 mg total) by mouth 2 (two) times daily.  . Calcium Carb-Cholecalciferol (CALTRATE 600+D3) 600-800 MG-UNIT TABS Take 1 tablet by mouth 2 (two) times daily.  . Cholecalciferol (VITAMIN D3) 50 MCG (2000 UT) capsule Take 1 capsule (2,000 Units total) by mouth daily.  . cycloSPORINE (RESTASIS) 0.05 % ophthalmic emulsion Place 1 drop into both eyes 2 (two) times daily.  . diphenoxylate-atropine (LOMOTIL) 2.5-0.025 MG tablet 1-2 every 8 hours as needed  . fluticasone (FLONASE) 50 MCG/ACT nasal spray Place 1 spray into both nostrils daily for 14 days.  . folic acid (FOLVITE) 1 MG tablet Take 1 mg by mouth daily.  . furosemide (LASIX) 40 MG tablet Take 40 mg by mouth as needed.  . Glucosamine-Chondroit-Vit C-Mn (GLUCOSAMINE CHONDR 500 COMPLEX PO) Take 2 tablets by mouth daily.   . hydrochlorothiazide (HYDRODIURIL) 25 MG tablet Take 1 tablet (25 mg total) by mouth daily.  Marland Kitchen losartan (COZAAR) 100 MG tablet Take 1 tablet (100 mg total) by mouth daily.  . Menthol, Topical Analgesic, (ASPERCREME MAX ROLL-ON EX) Apply topically.  . Multiple Vitamin (MULTIVITAMIN) tablet Take 1 tablet by mouth daily.   . Omega-3 Fatty Acids (FISH OIL) 1000 MG CAPS Take 1,000 mg by mouth daily.   . predniSONE (DELTASONE) 5 MG tablet TAKE 1 TABLET BY MOUTH WITH BREAKFAST  . Propylene Glycol (SYSTANE BALANCE) 0.6 % SOLN Place 1 drop into both eyes 2 (two) times daily as needed (dry eyes).   . rifaximin (XIFAXAN) 550 MG TABS tablet  Take 1 tablet (550 mg total) by mouth 3 (three) times daily.  . traMADol (ULTRAM) 50 MG tablet Take by mouth 2 (two) times daily as needed.  . urea (CARMOL) 10 % cream Apply topically as needed.  . [DISCONTINUED] Abatacept (ORENCIA IV) Inject into the vein. Infusion every 2 weeks, initiated by Dr.Beekman  . [DISCONTINUED] azithromycin (ZITHROMAX) 250 MG tablet 2 tablets today, then 1 tablet daily for 4 days   No facility-administered encounter medications on file as of 05/22/2019.     Review of Systems:  Review of Systems  Constitutional: Positive for fatigue. Negative for activity change and appetite change.  HENT: Positive for postnasal drip. Negative for dental problem, hearing loss and trouble swallowing.   Eyes: Negative for photophobia and visual disturbance.  Respiratory:  Negative for cough, shortness of breath and wheezing.   Cardiovascular: Negative for chest pain, palpitations and leg swelling.  Gastrointestinal: Positive for diarrhea. Negative for abdominal pain, constipation and nausea.  Endocrine: Negative for polydipsia, polyphagia and polyuria.  Genitourinary: Negative for dysuria, frequency, hematuria and vaginal bleeding.  Musculoskeletal: Positive for arthralgias, back pain and myalgias.  Skin: Negative.   Neurological: Negative for dizziness, weakness and headaches.  Hematological: Bruises/bleeds easily.  Psychiatric/Behavioral: Negative for dysphoric mood and sleep disturbance. The patient is not nervous/anxious.     Health Maintenance  Topic Date Due  . Hepatitis C Screening  [birth year]  . TETANUS/TDAP  09/05/2015  . INFLUENZA VACCINE  04/05/2019  . MAMMOGRAM  03/13/2021  . COLONOSCOPY  04/14/2025  . DEXA SCAN  Completed  . PNA vac Low Risk Adult  Completed    Physical Exam: There were no vitals filed for this visit. There is no height or weight on file to calculate BMI. Physical Exam Vitals signs reviewed.  Constitutional:      Appearance: Normal  appearance. She is normal weight.  HENT:     Head: Normocephalic.     Nose: Nose normal. No congestion or rhinorrhea.  Neck:     Musculoskeletal: Normal range of motion.  Cardiovascular:     Rate and Rhythm: Normal rate and regular rhythm.     Pulses: Normal pulses.     Heart sounds: Normal heart sounds. No murmur.  Pulmonary:     Effort: Pulmonary effort is normal. No respiratory distress.     Breath sounds: Normal breath sounds. No wheezing.  Abdominal:     General: Abdomen is flat. Bowel sounds are normal.     Palpations: Abdomen is soft.     Tenderness: There is no abdominal tenderness.  Musculoskeletal: Normal range of motion.     Right lower leg: No edema.     Left lower leg: No edema.  Lymphadenopathy:     Cervical: No cervical adenopathy.  Skin:    General: Skin is warm and dry.     Capillary Refill: Capillary refill takes less than 2 seconds.     Findings: No bruising or lesion.  Neurological:     General: No focal deficit present.     Mental Status: She is alert and oriented to person, place, and time. Mental status is at baseline.  Psychiatric:        Mood and Affect: Mood normal.        Behavior: Behavior normal.        Thought Content: Thought content normal.        Judgment: Judgment normal.     Labs reviewed: Basic Metabolic Panel: Recent Labs    10/21/18 10/28/18 1128 05/14/19 0914  NA  --  139 140  K  --  3.8 3.6  CL  --  101 102  CO2  --  31 31  GLUCOSE  --  83 82  BUN  --  18 32*  CREATININE  --  0.71 1.03*  CALCIUM  --  8.5* 8.6  TSH 3.90  --   --    Liver Function Tests: Recent Labs    10/28/18 1128 05/14/19 0914  AST 17 11  ALT 11 9  BILITOT 0.2 0.3  PROT 6.2 5.8*   No results for input(s): LIPASE, AMYLASE in the last 8760 hours. No results for input(s): AMMONIA in the last 8760 hours. CBC: Recent Labs    10/28/18 1128 05/14/19 0914  WBC 8.0 9.0  NEUTROABS 3,608 4,158  HGB 11.8 11.6*  HCT 36.4 35.5  MCV 86.1 89.0  PLT  327 311   Lipid Panel: Recent Labs    10/28/18 1128 05/14/19 0914  CHOL 168 143  HDL 63 53  LDLCALC 78 72  TRIG 178* 95  CHOLHDL 2.7 2.7   Lab Results  Component Value Date   HGBA1C 5.3 05/14/2019    Procedures since last visit: No results found.  Assessment/Plan 1. Renal insufficiency - her creatitine has increased and her GFR has significantly decreased from last visit - suspect dehydration from ongoing diarrhea - will recheck complete metabolic panel with GFR today - continue to avoid drugs that are nephrotoxic to the kidneys, including NSAIDS, and dose medications appropriately for kidney excretion - encourage hydration with water, especially when diarrhea has increased - decrease biotin supplement to 100ug/day - Discontinue  xifaxan until kidney function has improved  2. Senile osteoprosis - order bone density test - continue to take calcium and vitamin D  3. Colitis - followed by Dr. Henrene Pastor, will be been in October 2020 - continue to take lomotil - encourage hydration to prevent dehydration with diarrhea - limit usage of artificial sweetners - continue to take metamucil to bulk stool  4. Hypothyroidism, unspecified type - followed by Dr. Elyse Hsu - stable, has not progressed  5. Prediabetes - hemoglobin A1C 5.3, reduced from last time - follows a BRAT diet due to ongoing diarrhea  6. Hypertriglyceridemia - reduced from last time  7. Need for influenza vaccination - administer Fluad Quad (high dose 65+) vaccination   Labs/tests ordered: recheck complete metabolic panel with GFR today, future labs- comeplete metabolic panel with platelets, complete metabolic panel with GFR, hemoglobin A1C, lipid panel,  Next appt:  6 month follow up

## 2019-05-22 NOTE — Patient Instructions (Addendum)
Push fluids especially when having diarrhea. Reduce biotin dose--maximum recommended is 100mg  when having kidney changes We need to recheck your bone density.

## 2019-06-12 ENCOUNTER — Other Ambulatory Visit: Payer: Self-pay

## 2019-06-12 ENCOUNTER — Encounter: Payer: Self-pay | Admitting: Internal Medicine

## 2019-06-12 ENCOUNTER — Ambulatory Visit: Payer: Medicare Other | Admitting: Internal Medicine

## 2019-06-12 VITALS — BP 110/60 | HR 74 | Temp 97.3°F | Ht 60.0 in | Wt 103.6 lb

## 2019-06-12 DIAGNOSIS — R197 Diarrhea, unspecified: Secondary | ICD-10-CM | POA: Diagnosis not present

## 2019-06-12 MED ORDER — RIFAXIMIN 550 MG PO TABS: 550.0000 mg | ORAL_TABLET | Freq: Three times a day (TID) | ORAL | 0 refills | Status: DC

## 2019-06-12 NOTE — Progress Notes (Signed)
HISTORY OF PRESENT ILLNESS:  Joan [name] is a 73 y.o. female with past medical history as listed below.  She has been seen in this office periodically for recurrent diarrhea felt secondary to postinfectious IBS.  Typically treated with Xifaxan with good response.  She was seen October 2019 regarding the same.  She was treated with Xifaxan and responded.  She tells me that she did well until about 3 months ago when she developed recurrent diarrhea.  Typically 3-5 bowel movements per day, mostly in the morning.  These are loose.  No blood.  No abdominal pain.  No weight loss.  No fevers.  More times than not, exacerbated by meals.  No incontinence.  No nocturnal symptoms.  She recently asked for Lomotil which she is using no more than 2/day.  She does not feel that seems to help much.  No weight loss.  She is accompanied by her husband.  She has concerns over the cost of Xifaxan despite its effectiveness.  Review of outside blood work from May 14, 2019 shows unremarkable comprehensive metabolic panel.  CBC with hemoglobin 11.6.  Her last colonoscopy August 2016 with Dr. Carlean Purl.  Severe sigmoid diverticulosis.  Biopsies revealed nonspecific colitis.  Felt to be self-limited infectious process.  She denies new medications.  She does take prednisone for her arthritis  REVIEW OF SYSTEMS:  All non-GI ROS negative unless otherwise stated in the HPI except for arthritis  Past Medical History:  Diagnosis Date  . Alopecia areata 02/16/2006  . Anxiety state, unspecified 2004  . Benign paroxysmal positional vertigo 04/01/03  . Cancer ([place])    skin   . Cervicalgia 04/01/03  . Chronic rhinitis 09/01/05  . Corns and callosities   . Dermatophytosis of nail   . Diarrhea 04/15/2015  . Diverticulosis   . Dysphagia, unspecified(787.20) 2004  . Enthesopathy of ankle and tarsus, unspecified 03/31/96  . External hemorrhoids without mention of complication   . 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 Fibromyalgia   .  Hypertension 2004  . Hypothyroidism   . Impacted cerumen    right ear  . Lumbago   . Myalgia and myositis, unspecified 03/01/05  . Nontoxic uninodular goiter   . Osteoarthrosis, unspecified whether generalized or localized, unspecified site 04/01/03  . Other left bundle branch block   . Rheumatoid arthritis(714.0) 10/05/2004   lumbar spondylosis. Lumbo-sacral anterolisthesis    . Senile osteoporosis 04/01/03  . Sprain of metatarsophalangeal (joint) of foot   . Thyrotoxicosis   . Thyrotoxicosis without mention of goiter or other cause, without mention of thyrotoxic crisis or storm   . Unspecified vitamin D deficiency   . Urinary frequency     Past Surgical History:  Procedure Laterality Date  . COLONOSCOPY    . COLONOSCOPY N/A 04/15/2015   Procedure: COLONOSCOPY;  Surgeon: Gatha Mayer, MD;  Location: [facility];  Service: Endoscopy;  Laterality: N/A;  . DG FINGERS, MULTIPLE LT HAND ([facility] HX)    . FOOT SURGERY Bilateral 05/22/2014   Dr.Strom   . LUMBAR DISC SURGERY  12/12/2017   Dr Arnoldo Morale  . [place]   [place]   . TONSILLECTOMY AND ADENOIDECTOMY  1974  . TUBAL LIGATION  1973    Social History Ceniya A Parmar  reports that she has never smoked. She has never used smokeless tobacco. She reports that she does not drink alcohol or use drugs.  family history includes Heart attack in her father; Heart disease in her father.  Allergies  Allergen Reactions  . Arava [Leflunomide] Diarrhea, Nausea Only and Other (See Comments)    Loss appetite  . Penicillins Rash and Other (See Comments)    PATIENT HAS HAD A PCN REACTION WITH IMMEDIATE RASH, FACIAL/TONGUE/THROAT SWELLING, SOB, OR LIGHTHEADEDNESS WITH HYPOTENSION:  #  #  YES  #  #  Has patient had a PCN reaction causing severe rash involving mucus membranes or skin necrosis: no Has patient had a PCN reaction that required hospitalization: no   .  Methotrexate Derivatives Other (See Comments)    Hair loss  . Neurontin [Gabapentin] Nausea And Vomiting       PHYSICAL EXAMINATION: Vital signs: BP 110/60   Pulse 74   Temp (!) 97.3 F (36.3 C)   Ht 5' (1.524 m)   Wt 103 lb 9.6 oz (47 kg)   BMI 20.23 kg/m   Constitutional: generally well-appearing, no acute distress Psychiatric: alert and oriented x3, cooperative Eyes: extraocular movements intact, anicteric, conjunctiva pink Mouth: oral pharynx moist, no lesions Neck: supple no lymphadenopathy Cardiovascular: heart regular rate and rhythm, no murmur Lungs: clear to auscultation bilaterally Abdomen: soft, nontender, nondistended, no obvious ascites, no peritoneal signs, normal bowel sounds, no organomegaly Rectal: Omitted Extremities: no clubbing, cyanosis, or lower extremity edema bilaterally.  Arthritic deformities of both hands Skin: no lesions on visible extremities Neuro: No focal deficits.  Cranial nerves intact  ASSESSMENT:  1.  Recurrent diarrhea.  Felt previously to have postinfectious IBS.  Has responded to Xifaxan.  May be the same versus bacterial overgrowth.  Last colonoscopy 2016 as described 2.  Multiple medical problems   PLAN:  1.  The patient was provided with Xifaxan samples.  550 mg 3 times daily for 2 weeks. 2.  Continue Lomotil.  May use up to 4/day. 3.  GI office follow-up 2 months 25-minute spent face-to-face with the patient.  Greater than 50% of the time used for counseling regarding her chronic recurrent diarrhea

## 2019-06-12 NOTE — Patient Instructions (Signed)
You have been given samples of Xifaxan.  Please take one tablet three times a day for 2 weeks.

## 2019-06-28 ENCOUNTER — Other Ambulatory Visit: Payer: Self-pay | Admitting: Internal Medicine

## 2019-07-03 ENCOUNTER — Other Ambulatory Visit: Payer: Self-pay | Admitting: Internal Medicine

## 2019-07-03 DIAGNOSIS — I10 Essential (primary) hypertension: Secondary | ICD-10-CM

## 2019-07-10 ENCOUNTER — Telehealth: Payer: Self-pay | Admitting: Internal Medicine

## 2019-07-10 ENCOUNTER — Other Ambulatory Visit: Payer: Self-pay

## 2019-07-10 DIAGNOSIS — R197 Diarrhea, unspecified: Secondary | ICD-10-CM

## 2019-07-10 MED ORDER — COLESTIPOL HCL 1 G PO TABS: 2.0000 g | ORAL_TABLET | Freq: Two times a day (BID) | ORAL | 3 refills | Status: DC

## 2019-07-10 NOTE — Telephone Encounter (Signed)
Spoke with pt and she is aware. Orders in epic. Script sent to the pharmacy.

## 2019-07-10 NOTE — Telephone Encounter (Signed)
Pt states she took the xifaxan and she is still having diarrhea. Reports she is taking lomotil 4/day and still having diarrhea. States she has already had 5 watery stools this morning. She is asking for something else to take.Please advise.

## 2019-07-10 NOTE — Telephone Encounter (Signed)
1.  Check stools for enteric pathogens and C. difficile 2.  Initiate Colestid 2 g twice daily (not to be taken within 2 hours of other medications) 3.  Have her contact the office in 2 weeks with a follow-up on her condition.  If she continues to have problems we may need to set up colonoscopy with biopsies.  Thanks

## 2019-07-10 NOTE — Telephone Encounter (Signed)
Pt states that xifaxan has not helped her with diarrhea. She wants to know if she can take something else.

## 2019-07-14 ENCOUNTER — Other Ambulatory Visit: Payer: Medicare Other

## 2019-07-14 DIAGNOSIS — R197 Diarrhea, unspecified: Secondary | ICD-10-CM

## 2019-07-17 ENCOUNTER — Other Ambulatory Visit: Payer: Self-pay | Admitting: Internal Medicine

## 2019-07-17 NOTE — Telephone Encounter (Signed)
High risk or very high risk warning populated when attempting to refill medication. RX request sent to PCP for review and approval if warranted.   

## 2019-07-18 LAB — GASTROINTESTINAL PATHOGEN PANEL PCR
C. difficile Tox A/B, PCR: NOT DETECTED
Campylobacter, PCR: NOT DETECTED
Cryptosporidium, PCR: NOT DETECTED
E coli (ETEC) LT/ST PCR: NOT DETECTED
E coli (STEC) stx1/stx2, PCR: NOT DETECTED
E coli 0157, PCR: NOT DETECTED
Giardia lamblia, PCR: NOT DETECTED
Norovirus, PCR: NOT DETECTED
Rotavirus A, PCR: NOT DETECTED
Salmonella, PCR: NOT DETECTED
Shigella, PCR: NOT DETECTED

## 2019-07-28 ENCOUNTER — Telehealth: Payer: Self-pay | Admitting: Internal Medicine

## 2019-07-28 NOTE — Telephone Encounter (Signed)
Spoke with Joan [name] and she will keep the OV as scheduled, Joan [name] can discuss further with Dr. Henrene Pastor and schedule colon and be instructed if she decides to proceed with colon.

## 2019-07-28 NOTE — Telephone Encounter (Signed)
Pt calling back with an update. States the colestipol is not working. States she is taking 2gm BID and still having 3-4 diarrhea stools/day. Please advise.

## 2019-07-28 NOTE — Telephone Encounter (Signed)
Probably best to proceed with colonoscopy at this point, unless she wishes to talk about things in the office prior to scheduling

## 2019-07-28 NOTE — Telephone Encounter (Signed)
She has an OV scheduled on 08/08/19, do you want her to keep this and schedule procedure at that visit?

## 2019-07-28 NOTE — Telephone Encounter (Signed)
At this point we should set her up for colonoscopy in the [place] to rule out colitis.  Thanks

## 2019-08-07 ENCOUNTER — Other Ambulatory Visit: Payer: Self-pay | Admitting: Internal Medicine

## 2019-08-08 ENCOUNTER — Encounter: Payer: Self-pay | Admitting: Internal Medicine

## 2019-08-08 ENCOUNTER — Ambulatory Visit: Payer: Medicare Other | Admitting: Internal Medicine

## 2019-08-08 ENCOUNTER — Other Ambulatory Visit: Payer: Self-pay

## 2019-08-08 VITALS — BP 130/64 | HR 76 | Temp 98.4°F | Ht <= 58 in | Wt 101.4 lb

## 2019-08-08 DIAGNOSIS — Z1159 Encounter for screening for other viral diseases: Secondary | ICD-10-CM

## 2019-08-08 DIAGNOSIS — R197 Diarrhea, unspecified: Secondary | ICD-10-CM

## 2019-08-08 DIAGNOSIS — R1310 Dysphagia, unspecified: Secondary | ICD-10-CM | POA: Diagnosis not present

## 2019-08-08 MED ORDER — NA SULFATE-K SULFATE-MG SULF 17.5-3.13-1.6 GM/177ML PO SOLN: 1.0000 | Freq: Once | ORAL | 0 refills | Status: AC

## 2019-08-08 NOTE — Patient Instructions (Signed)
You have been scheduled for an endoscopy and colonoscopy. Please follow the written instructions given to you at your visit today. Please pick up your prep supplies at the pharmacy within the next 1-3 days. If you use inhalers (even only as needed), please bring them with you on the day of your procedure.  

## 2019-08-08 NOTE — Progress Notes (Signed)
HISTORY OF PRESENT ILLNESS:  Joan [name] is a 73 y.o. female with past medical history as listed below.  She has been seen in this office periodically for recurrent diarrhea felt secondary to postinfectious IBS.  Previously treated with Xifaxan with seemingly good response.  She has been seen again in recent months with recurrent issues with diarrhea.  Last office visit June 12, 2019.  Seemingly responded to Xifaxan.  However persistent problem since.  Has not responded to Colestid.  Subsequently added Lomotil.  Slightly better.  Currently describes 2-3 bowel movements in the past few weeks.  Not her baseline.  Rare nocturnal symptoms.  Definite urgency.  She is accompanied by her husband.  She also mentions a new complaint of intermittent solid food dysphagia.  Pills however seem to be worse.  Rare GERD symptoms.  She is not on PPI.  She does take 5 mg of prednisone chronically for her rheumatoid arthritis  REVIEW OF SYSTEMS:  All non-GI ROS negative unless otherwise stated in the HPI except for arthritis, excessive urination  Past Medical History:  Diagnosis Date  . Alopecia areata 02/16/2006  . Anxiety state, unspecified 2004  . Benign paroxysmal positional vertigo 04/01/03  . Cancer ([place])    skin   . Cervicalgia 04/01/03  . Chronic rhinitis 09/01/05  . Corns and callosities   . Dermatophytosis of nail   . Diarrhea 04/15/2015  . Diverticulosis   . Dysphagia, unspecified(787.20) 2004  . Enthesopathy of ankle and tarsus, unspecified 03/31/96  . External hemorrhoids without mention of complication   . 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 Fibromyalgia   . Hypertension 2004  . Hypothyroidism   . Impacted cerumen    right ear  . Lumbago   . Myalgia and myositis, unspecified 03/01/05  . Nontoxic uninodular goiter   . Osteoarthrosis, unspecified whether generalized or localized, unspecified site 04/01/03  . Other left bundle branch block   . Rheumatoid arthritis(714.0) 10/05/2004   lumbar  spondylosis. Lumbo-sacral anterolisthesis    . Senile osteoporosis 04/01/03  . Sprain of metatarsophalangeal (joint) of foot   . Thyrotoxicosis   . Thyrotoxicosis without mention of goiter or other cause, without mention of thyrotoxic crisis or storm   . Unspecified vitamin D deficiency   . Urinary frequency     Past Surgical History:  Procedure Laterality Date  . COLONOSCOPY    . COLONOSCOPY N/A 04/15/2015   Procedure: COLONOSCOPY;  Surgeon: Gatha Mayer, MD;  Location: [facility];  Service: Endoscopy;  Laterality: N/A;  . DG FINGERS, MULTIPLE LT HAND ([place] HX)    . FOOT SURGERY Bilateral 05/22/2014   Dr.Strom   . LUMBAR DISC SURGERY  12/12/2017   Dr Arnoldo Morale  . [facility]   DR [place]   . TONSILLECTOMY AND ADENOIDECTOMY  1974  . TUBAL LIGATION  1973    Social History Markitta A Raso  reports that she has never smoked. She has never used smokeless tobacco. She reports that she does not drink alcohol or use drugs.  family history includes Heart attack in her father; Heart disease in her father.  Allergies  Allergen Reactions  . Arava [Leflunomide] Diarrhea, Nausea Only and Other (See Comments)    Loss appetite  . Penicillins Rash and Other (See Comments)    PATIENT HAS HAD A PCN REACTION WITH IMMEDIATE RASH, FACIAL/TONGUE/THROAT SWELLING, SOB, OR LIGHTHEADEDNESS WITH HYPOTENSION:  #  #  YES  #  #  Has patient had a PCN reaction causing severe rash involving mucus membranes or skin necrosis: no Has patient had a PCN reaction that required hospitalization: no   . Methotrexate Derivatives Other (See Comments)    Hair loss  . Neurontin [Gabapentin] Nausea And Vomiting       PHYSICAL EXAMINATION: Vital signs: BP 130/64 (BP Location: Left Arm, Patient Position: Sitting, Cuff Size: Normal)   Pulse 76   Temp 98.4 F (36.9 C)   Ht 4' 8.3" (1.43 m) Comment: height measured without shoes  Wt 101 lb 6 oz  (46 kg)   BMI 22.49 kg/m   Constitutional: generally well-appearing, no acute distress Psychiatric: alert and oriented x3, cooperative Eyes: extraocular movements intact, anicteric, conjunctiva pink Mouth: oral pharynx moist, no lesions Neck: supple no lymphadenopathy Cardiovascular: heart regular rate and rhythm, no murmur Lungs: clear to auscultation bilaterally Abdomen: soft, nontender, nondistended, no obvious ascites, no peritoneal signs, normal bowel sounds, no organomegaly Rectal: Deferred until colonoscopy Extremities: no clubbing, cyanosis, or lower extremity edema bilaterally.  Arthritic deformities bilaterally Skin: no lesions on visible extremities Neuro: No focal deficits.  Cranial nerves intact  ASSESSMENT:  1.  Chronic recurrent diarrhea.  Felt to have postinfectious IBS.  Response to Xifaxan in the past, but not recently.  Now on Colestid and Lomotil with slight improvement only.  Colonoscopy in 2016 with changes of colitis most consistent with infectious. 2.  Intermittent dysphagia with pills and food. 3.  Rare reflux symptoms 4.  Multiple medical problems including arthritis, chronic immunosuppression with prednisone.   PLAN:  1.  Schedule colonoscopy with biopsies.  Patient is high risk given her age and comorbidities.The nature of the procedure, as well as the risks, benefits, and alternatives were carefully and thoroughly reviewed with the patient. Ample time for discussion and questions allowed. The patient understood, was satisfied, and agreed to proceed. 2.  Schedule upper endoscopy with possible esophageal dilation.  The patient is high risk given her age and comorbidities.The nature of the procedure, as well as the risks, benefits, and alternatives were carefully and thoroughly reviewed with the patient. Ample time for discussion and questions allowed. The patient understood, was satisfied, and agreed to proceed. 3.  Continue Lomotil on Colestid for now 4.   Further recommendations after the above work-up completed 40 minutes spent with the patient face-to-face.  Greater than 50% the time used for counseling regarding her persistent issues with diarrhea, or new issues with dysphagia, work-up to date, and treatment plan/work-up moving forward.  Multiple questions answered to their satisfaction.

## 2019-08-19 ENCOUNTER — Encounter: Payer: Self-pay | Admitting: Internal Medicine

## 2019-08-25 ENCOUNTER — Ambulatory Visit (INDEPENDENT_AMBULATORY_CARE_PROVIDER_SITE_OTHER): Payer: Medicare Other

## 2019-08-25 ENCOUNTER — Other Ambulatory Visit: Payer: Self-pay | Admitting: Internal Medicine

## 2019-08-25 DIAGNOSIS — Z1159 Encounter for screening for other viral diseases: Secondary | ICD-10-CM

## 2019-08-25 LAB — SARS CORONAVIRUS 2 (TAT 6-24 HRS): SARS Coronavirus 2: NEGATIVE

## 2019-08-26 ENCOUNTER — Other Ambulatory Visit: Payer: Medicare Other

## 2019-08-27 ENCOUNTER — Encounter: Payer: Self-pay | Admitting: Internal Medicine

## 2019-08-27 ENCOUNTER — Ambulatory Visit (INDEPENDENT_AMBULATORY_CARE_PROVIDER_SITE_OTHER): Payer: Medicare Other | Admitting: Internal Medicine

## 2019-08-27 ENCOUNTER — Other Ambulatory Visit: Payer: Self-pay

## 2019-08-27 VITALS — BP 116/63 | HR 64 | Temp 98.8°F | Resp 11 | Ht <= 58 in | Wt 101.0 lb

## 2019-08-27 DIAGNOSIS — K222 Esophageal obstruction: Secondary | ICD-10-CM

## 2019-08-27 DIAGNOSIS — K648 Other hemorrhoids: Secondary | ICD-10-CM

## 2019-08-27 DIAGNOSIS — R197 Diarrhea, unspecified: Secondary | ICD-10-CM

## 2019-08-27 DIAGNOSIS — K449 Diaphragmatic hernia without obstruction or gangrene: Secondary | ICD-10-CM

## 2019-08-27 DIAGNOSIS — K5730 Diverticulosis of large intestine without perforation or abscess without bleeding: Secondary | ICD-10-CM

## 2019-08-27 DIAGNOSIS — K259 Gastric ulcer, unspecified as acute or chronic, without hemorrhage or perforation: Secondary | ICD-10-CM | POA: Diagnosis not present

## 2019-08-27 DIAGNOSIS — K52831 Collagenous colitis: Secondary | ICD-10-CM | POA: Diagnosis not present

## 2019-08-27 DIAGNOSIS — R1310 Dysphagia, unspecified: Secondary | ICD-10-CM | POA: Diagnosis not present

## 2019-08-27 DIAGNOSIS — R1319 Other dysphagia: Secondary | ICD-10-CM

## 2019-08-27 MED ORDER — SODIUM CHLORIDE 0.9 % IV SOLN: 500.0000 mL | Freq: Once | INTRAVENOUS | Status: DC

## 2019-08-27 MED ORDER — OMEPRAZOLE 40 MG PO CPDR: 40.0000 mg | DELAYED_RELEASE_CAPSULE | Freq: Every day | ORAL | 11 refills | Status: DC

## 2019-08-27 NOTE — Progress Notes (Signed)
Called to room to assist during endoscopic procedure.  Patient ID and intended procedure confirmed with present staff. Received instructions for my participation in the procedure from the performing physician.  

## 2019-08-27 NOTE — Patient Instructions (Addendum)
Pick up new prescription for omeprazole 40mg  by mouth daily at [facility] in [place]. Follow post-dilation diet.  Nothing by mouth until 2 pm then Clear liquids for one hour then soft foods as tolerated for the rest of the day. Office follow-up appt in 6 weeks (Dr Blanch Media nurse will call you to set this up). Please read hand-outs. Diverticulosis noted today.  Dr Henrene Pastor took biopsies today to evaluate for microscopic colitis.  Await these results.   YOU HAD AN ENDOSCOPIC PROCEDURE TODAY AT [facility]:   Refer to the procedure report that was given to you for any specific questions about what was found during the examination.  If the procedure report does not answer your questions, please call your gastroenterologist to clarify.  If you requested that your care partner not be given the details of your procedure findings, then the procedure report has been included in a sealed envelope for you to review at your convenience later.  YOU SHOULD EXPECT: Some feelings of bloating in the abdomen. Passage of more gas than usual.  Walking can help get rid of the air that was put into your GI tract during the procedure and reduce the bloating. If you had a lower endoscopy (such as a colonoscopy or flexible sigmoidoscopy) you may notice spotting of blood in your stool or on the toilet paper. If you underwent a bowel prep for your procedure, you may not have a normal bowel movement for a few days.  Please Note:  You might notice some irritation and congestion in your nose or some drainage.  This is from the oxygen used during your procedure.  There is no need for concern and it should clear up in a day or so.  SYMPTOMS TO REPORT IMMEDIATELY:   Following lower endoscopy (colonoscopy or flexible sigmoidoscopy):  Excessive amounts of blood in the stool  Significant tenderness or worsening of abdominal pains  Swelling of the abdomen that is new, acute  Fever of 100F or higher   Following upper  endoscopy (EGD)  Vomiting of blood or coffee ground material  New chest pain or pain under the shoulder blades  Painful or persistently difficult swallowing  New shortness of breath  Fever of 100F or higher  Black, tarry-looking stools  For urgent or emergent issues, a gastroenterologist can be reached at any hour by calling [phone].   DIET:  Post dilation diet today.  Drink plenty of fluids but you should avoid alcoholic beverages for 24 hours.  ACTIVITY:  You should plan to take it easy for the rest of today and you should NOT DRIVE or use heavy machinery until tomorrow (because of the sedation medicines used during the test).    FOLLOW UP: Our staff will call the number listed on your records 48-72 hours following your procedure to check on you and address any questions or concerns that you may have regarding the information given to you following your procedure. If we do not reach you, we will leave a message.  We will attempt to reach you two times.  During this call, we will ask if you have developed any symptoms of COVID 19. If you develop any symptoms (ie: fever, flu-like symptoms, shortness of breath, cough etc.) before then, please call [phone].  If you test positive for Covid 19 in the 2 weeks post procedure, please call and report this information to [place].    If any biopsies were taken you will be contacted by phone or by  letter within the next 1-3 weeks.  Please call us at [phone] if you have not heard about the biopsies in 3 weeks.    SIGNATURES/CONFIDENTIALITY: You and/or your care partner have signed paperwork which will be entered into your electronic medical record.  These signatures attest to the fact that that the information above on your After Visit Summary has been reviewed and is understood.  Full responsibility of the confidentiality of this discharge information lies with you and/or your care-partner.

## 2019-08-27 NOTE — Op Note (Signed)
[facility] Patient Name: Joan [name] Procedure Date: 08/27/2019 2:04 PM MRN: [id number] Endoscopist: Docia Chuck. Henrene Pastor , MD Age: 73 Referring MD:  Date of Birth: [birth year] Gender: Female Account #: [id number] Procedure:                Colonoscopy with biopsies Indications:              Chronic diarrhea. Failure to reliably respond to                            multiple empiric therapies. Supplemental work-up to                            date unrevealing Medicines:                Monitored Anesthesia Care Procedure:                Pre-Anesthesia Assessment:                           - Prior to the procedure, a History and Physical                            was performed, and patient medications and                            allergies were reviewed. The patient's tolerance of                            previous anesthesia was also reviewed. The risks                            and benefits of the procedure and the sedation                            options and risks were discussed with the patient.                            All questions were answered, and informed consent                            was obtained. Prior Anticoagulants: The patient has                            taken no previous anticoagulant or antiplatelet                            agents. ASA Grade Assessment: II - A patient with                            mild systemic disease. After reviewing the risks                            and benefits, the patient was deemed in  satisfactory condition to undergo the procedure.                           After obtaining informed consent, the colonoscope                            was passed under direct vision. Throughout the                            procedure, the patient's blood pressure, pulse, and                            oxygen saturations were monitored continuously. The                            Colonoscope was introduced  through the anus and                            advanced to the the cecum, identified by                            appendiceal orifice and ileocecal valve. The                            terminal ileum, ileocecal valve, appendiceal                            orifice, and rectum were photographed. The quality                            of the bowel preparation was excellent. The                            colonoscopy was performed without difficulty. The                            patient tolerated the procedure well. The bowel                            preparation used was SUPREP via split dose                            instruction. Scope In: 2:26:00 PM Scope Out: 2:38:54 PM Scope Withdrawal Time: 0 hours 9 minutes 21 seconds  Total Procedure Duration: 0 hours 12 minutes 54 seconds  Findings:                 The terminal ileum appeared normal.                           Multiple diverticula were found in the sigmoid                            colon. Small internal hemorrhoids present.  The entire examined colon appeared otherwise normal                            on direct and retroflexion views. Biopsies for                            histology were taken with a cold forceps from the                            entire colon for evaluation of microscopic colitis. Complications:            No immediate complications. Estimated blood loss:                            None. Estimated Blood Loss:     Estimated blood loss: none. Impression:               - The examined portion of the ileum was normal.                           - Diverticulosis in the sigmoid colon. Small                            internal hemorrhoids present.                           - The entire examined colon is otherwise normal on                            direct and retroflexion views. Recommendation:           - Repeat colonoscopy is not recommended for                             surveillance.                           - Patient has a contact number available for                            emergencies. The signs and symptoms of potential                            delayed complications were discussed with the                            patient. Return to normal activities tomorrow.                            Written discharge instructions were provided to the                            patient.                           - Resume previous diet.                           -  Continue present medications.                           - Await pathology results. We will contact you with                            the results and further recommendations. Docia Chuck. Henrene Pastor, MD 08/27/2019 2:44:02 PM This report has been signed electronically.

## 2019-08-27 NOTE — Op Note (Signed)
[facility] Patient Name: Joan [name] Procedure Date: 08/27/2019 2:03 PM MRN: [id number] Endoscopist: Docia Chuck. Henrene Pastor , MD Age: 73 Referring MD:  Date of Birth: Oct 15, 1945 Gender: Female Account #: [id number] Procedure:                Upper GI endoscopy with biopsies; with balloon                            dilation of the esophagus?"20 mm Indications:              Dysphagia Medicines:                Monitored Anesthesia Care Procedure:                Pre-Anesthesia Assessment:                           - Prior to the procedure, a History and Physical                            was performed, and patient medications and                            allergies were reviewed. The patient's tolerance of                            previous anesthesia was also reviewed. The risks                            and benefits of the procedure and the sedation                            options and risks were discussed with the patient.                            All questions were answered, and informed consent                            was obtained. Prior Anticoagulants: The patient has                            taken no previous anticoagulant or antiplatelet                            agents. ASA Grade Assessment: II - A patient with                            mild systemic disease. After reviewing the risks                            and benefits, the patient was deemed in                            satisfactory condition to undergo the procedure.  After obtaining informed consent, the endoscope was                            passed under direct vision. Throughout the                            procedure, the patient's blood pressure, pulse, and                            oxygen saturations were monitored continuously. The                            Endoscope was introduced through the mouth, and                            advanced to the second part of  duodenum. The upper                            GI endoscopy was accomplished without difficulty.                            The patient tolerated the procedure well. Scope In: Scope Out: Findings:                 The cricopharyngeus muscle was prominent with                            resistance to passage of the standard endoscope.                            One benign-appearing, intrinsic moderate stenosis                            was found 35 cm from the incisors (gastroesophageal                            junction). This was ringlike and fibrous with some                            associated inflammation. This stenosis measured 1.4                            cm (inner diameter). The stenosis was traversed.                            After completing the endoscopic survey, A TTS                            dilator was passed through the scope. Dilation with                            an 18-19-20 mm balloon dilator was performed to 20  mm.                           The stomach revealed a 4 cm sliding hiatal hernia.                            As well, multiple antral erosions and small ulcers.                            Biopsies were taken with a cold forceps for                            Helicobacter pylori testing using CLOtest.                           The examined duodenum was normal.                           The cardia and gastric fundus were normal on                            retroflexion, save hiatal hernia. Complications:            No immediate complications. Estimated Blood Loss:     Estimated blood loss: none. Impression:               1. Prominent cricopharyngeus muscle                           2. Distal esophageal stricture with associated                            esophagitis status post balloon dilation to 20 mm                           3. Moderate hiatal hernia                           4. Multiple antral erosions and small ulcers  likely                            secondary to chronic NSAIDs status post CLO biopsy. Recommendation:           1. Prescribe omeprazole 40 mg daily; #30; 11 refills                           2. Follow-up CLO biopsy                           3. Post dilation diet                           4. Routine office follow-up with Dr. Henrene Pastor in 6                            weeks. Docia Chuck. Henrene Pastor, MD 08/27/2019 3:04:45 PM This report has been  signed electronically.

## 2019-08-27 NOTE — Progress Notes (Signed)
Temp taken by JB VS taken by CW 

## 2019-08-27 NOTE — Progress Notes (Signed)
A and O x3. Report to RN. Tolerated MAC anesthesia well.Teeth unchanged after procedure.

## 2019-08-28 LAB — HELICOBACTER PYLORI SCREEN-BIOPSY: UREASE: NEGATIVE

## 2019-09-01 ENCOUNTER — Telehealth: Payer: Self-pay | Admitting: *Deleted

## 2019-09-01 NOTE — Telephone Encounter (Signed)
Entered in error

## 2019-09-01 NOTE — Telephone Encounter (Signed)
  Follow up Call-  Call back number 08/27/2019  Post procedure Call Back phone  # [phone]  Permission to leave phone message Yes  Some recent data might be hidden     Patient questions:  Do you have a fever, pain , or abdominal swelling? No. Pain Score  0 *  Have you tolerated food without any problems? Yes.    Have you been able to return to your normal activities? Yes.    Do you have any questions about your discharge instructions: Diet   Yes.   Reviewed discharge diet instructions per AVS. Medications  No. Follow up visit  Yes.   Reviewed instructions on AVS that Dr. Blanch Media office will call to schedule follow-up in 6 weeks.  Do you have questions or concerns about your Care? No.  Actions: * If pain score is 4 or above: No action needed, pain <4.   1. Have you developed a fever since your procedure? no  2.   Have you had an respiratory symptoms (SOB or cough) since your procedure? no  3.   Have you tested positive for COVID 19 since your procedure no  4.   Have you had any family members/close contacts diagnosed with the COVID 19 since your procedure?  no   If yes to any of these questions please route to Joylene John, RN and Alphonsa Gin, Therapist, sports.

## 2019-09-08 ENCOUNTER — Encounter: Payer: Self-pay | Admitting: Internal Medicine

## 2019-09-08 ENCOUNTER — Other Ambulatory Visit: Payer: Self-pay

## 2019-09-08 MED ORDER — BUDESONIDE 3 MG PO CPEP: 9.0000 mg | ORAL_CAPSULE | Freq: Every day | ORAL | 6 refills | Status: DC

## 2019-09-14 ENCOUNTER — Other Ambulatory Visit: Payer: Self-pay | Admitting: Internal Medicine

## 2019-09-16 DIAGNOSIS — H04123 Dry eye syndrome of bilateral lacrimal glands: Secondary | ICD-10-CM | POA: Diagnosis not present

## 2019-09-16 DIAGNOSIS — H2513 Age-related nuclear cataract, bilateral: Secondary | ICD-10-CM | POA: Diagnosis not present

## 2019-09-16 DIAGNOSIS — H5213 Myopia, bilateral: Secondary | ICD-10-CM | POA: Diagnosis not present

## 2019-10-06 ENCOUNTER — Encounter: Payer: Self-pay | Admitting: Internal Medicine

## 2019-10-06 ENCOUNTER — Ambulatory Visit: Payer: Medicare PPO | Admitting: Internal Medicine

## 2019-10-06 VITALS — BP 112/60 | HR 66 | Temp 98.2°F | Wt 107.0 lb

## 2019-10-06 DIAGNOSIS — K222 Esophageal obstruction: Secondary | ICD-10-CM

## 2019-10-06 DIAGNOSIS — K259 Gastric ulcer, unspecified as acute or chronic, without hemorrhage or perforation: Secondary | ICD-10-CM

## 2019-10-06 DIAGNOSIS — K52839 Microscopic colitis, unspecified: Secondary | ICD-10-CM | POA: Diagnosis not present

## 2019-10-06 DIAGNOSIS — R1319 Other dysphagia: Secondary | ICD-10-CM

## 2019-10-06 DIAGNOSIS — R197 Diarrhea, unspecified: Secondary | ICD-10-CM

## 2019-10-06 DIAGNOSIS — R1310 Dysphagia, unspecified: Secondary | ICD-10-CM

## 2019-10-06 MED ORDER — DIPHENOXYLATE-ATROPINE 2.5-0.025 MG PO TABS: ORAL_TABLET | ORAL | 3 refills | Status: DC

## 2019-10-06 MED ORDER — BUDESONIDE ER 9 MG PO TB24: 1.0000 | ORAL_TABLET | Freq: Every day | ORAL | 1 refills | Status: DC

## 2019-10-06 NOTE — Patient Instructions (Addendum)
We have sent the following medications to your pharmacy for you to pick up at your convenience:  Lomotil, Uceris (Budesonide)  Please follow up with Dr. Henrene Pastor on 12/04/2019 at 2:40pm

## 2019-10-06 NOTE — Progress Notes (Signed)
HISTORY OF PRESENT ILLNESS:  Joan [name] is a 74 y.o. female last seen in the office August 08, 2019 regarding chronic recurrent diarrhea and intermittent solid food and pill dysphagia.  See that dictation for details.  She subsequently underwent complete colonoscopy and upper endoscopy August 27, 2019.  Colonoscopy revealed sigmoid diverticulosis, internal hemorrhoids.  The exam was otherwise normal including evaluation of the terminal ileum.  However, random colon biopsies revealed changes consistent with collagenous colitis.  Patient was placed on budesonide 9 mg daily.  Upper endoscopy revealed a prominent cricopharyngeus muscle as well as distal esophageal stricture with esophagitis, moderate hiatal hernia, and multiple antral erosions.  The esophageal stricture was balloon dilated to 20 mm.  Testing for Helicobacter pylori was negative.  She was placed on omeprazole 40 mg daily.  She follows up at this time.  She is accompanied by her husband.  First, the patient's please report that she has no further problems with reflux or dysphagia.  She is tolerating her medication well.  She is taking it at the appropriate time in the appropriate fashion.  She also is pleased to report significant improvement in her bowel habits.  Previously 6 or more loose bowel movements per day with urgency and fear of incontinence.  Currently with 2-3 bowel movements per day which have more form with less urgency and no incontinence.  No appreciable medication side effects though she does show me a letter from her insurance company stating that they will not cover budesonide 3 mg.  However they will cover extended release budesonide.  She has no new abdominal complaints.  She is still using some Lomotil and request medication refill  REVIEW OF SYSTEMS:  All non-GI ROS negative unless otherwise stated in the HPI except for arthritis  Past Medical History:  Diagnosis Date  . Alopecia areata 02/16/2006  . Anxiety state,  unspecified 2004  . Benign paroxysmal positional vertigo 04/01/03  . Cancer ([place])    skin   . Cervicalgia 04/01/03  . Chronic rhinitis 09/01/05  . Corns and callosities   . Dermatophytosis of nail   . Diarrhea 04/15/2015  . Diverticulosis   . Dysphagia, unspecified(787.20) 2004  . Enthesopathy of ankle and tarsus, unspecified 03/31/96  . External hemorrhoids without mention of complication   . 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 Fibromyalgia   . Hypertension 2004  . Hypothyroidism   . Impacted cerumen    right ear  . Lumbago   . Myalgia and myositis, unspecified 03/01/05  . Nontoxic uninodular goiter   . Osteoarthrosis, unspecified whether generalized or localized, unspecified site 04/01/03  . Other left bundle branch block   . Rheumatoid arthritis(714.0) 10/05/2004   lumbar spondylosis. Lumbo-sacral anterolisthesis    . Senile osteoporosis 04/01/03  . Sprain of metatarsophalangeal (joint) of foot   . Thyrotoxicosis   . Thyrotoxicosis without mention of goiter or other cause, without mention of thyrotoxic crisis or storm   . Unspecified vitamin D deficiency   . Urinary frequency     Past Surgical History:  Procedure Laterality Date  . COLONOSCOPY    . COLONOSCOPY N/A 04/15/2015   Procedure: COLONOSCOPY;  Surgeon: Gatha Mayer, MD;  Location: [facility];  Service: Endoscopy;  Laterality: N/A;  . DG FINGERS, MULTIPLE LT HAND ([place] HX)    . FOOT SURGERY Bilateral 05/22/2014   Dr.Strom   . LUMBAR DISC SURGERY  12/12/2017   Dr Arnoldo Morale  . [place]   .  RIGHT WRIST SUGERY FOR TENOSYNOVITIS  1997   DR [facility]   . TONSILLECTOMY AND ADENOIDECTOMY  1974  . TUBAL LIGATION  1973    Social History Joan [name]  reports that she has never smoked. She has never used smokeless tobacco. She reports that she does not drink alcohol or use drugs.  family history includes Heart attack in her father; Heart disease in her father.  Allergies  Allergen Reactions  .  Arava [Leflunomide] Diarrhea, Nausea Only and Other (See Comments)    Loss appetite  . Penicillins Rash and Other (See Comments)    PATIENT HAS HAD A PCN REACTION WITH IMMEDIATE RASH, FACIAL/TONGUE/THROAT SWELLING, SOB, OR LIGHTHEADEDNESS WITH HYPOTENSION:  #  #  YES  #  #  Has patient had a PCN reaction causing severe rash involving mucus membranes or skin necrosis: no Has patient had a PCN reaction that required hospitalization: no   . Methotrexate Derivatives Other (See Comments)    Hair loss  . Neurontin [Gabapentin] Nausea And Vomiting       PHYSICAL EXAMINATION: Vital signs: BP 112/60   Pulse 66   Temp 98.2 F (36.8 C)   Wt 107 lb (48.5 kg)   BMI 23.99 kg/m   Constitutional: Frail thin elderly female, no acute distress Psychiatric: alert and oriented x3, cooperative Eyes: extraocular movements intact, anicteric, conjunctiva pink Mouth: oral pharynx moist, no lesions Neck: supple no lymphadenopathy Cardiovascular: heart regular rate and rhythm, no murmur Lungs: clear to auscultation bilaterally Abdomen: soft, nontender, nondistended, no obvious ascites, no peritoneal signs, normal bowel sounds, no organomegaly Rectal: Omitted Extremities: no clubbing, cyanosis, or lower extremity edema bilaterally.  Arthritis deformities of the hands bilaterally Skin: no lesions on visible extremities Neuro: No focal deficits.  Cranial nerves intact  ASSESSMENT:  1.  Collagenous colitis.  Issues with diarrhea improving on budesonide 9 mg daily.  Has been on for approximately 1 month 2.  GERD complicated by peptic stricture.  Asymptomatic post dilation on PPI 3.  Multiple medical problems   PLAN:  1.  Continue budesonide 9 mg daily 2.  Prescribe budesonide extended release form 9 mg daily.  Medication risks reviewed 3.  Reflux precautions 4.  Continue omeprazole 40 mg daily.  Medication risks reviewed 5.  Refill Lomotil as requested.  Medication risks reviewed 6.  Routine GI  follow-up 2 months.  Contact the office in the interim for any questions or problems Total time of 30 minutes was spent preparing to see the patient.  Reviewing endoscopy reports and pathology, obtaining the relevant history, performing medically appropriate exam, counseling the patient regarding her GI conditions, answering questions from the patient and her husband, ordering medications as listed, arranging follow-up, and documenting clinical information in the health record

## 2019-10-08 ENCOUNTER — Telehealth: Payer: Self-pay | Admitting: Internal Medicine

## 2019-10-08 NOTE — Telephone Encounter (Signed)
Spoke with patient who stated that the cost of the new 9mg  Budesonide I sent to the pharmacy was still a little high and she was told a prior authorization would make her original Budesonide significantly cheaper.  [employer] faxing me a form to fill out.

## 2019-10-09 NOTE — Telephone Encounter (Signed)
Faxed prior authorization for 3mg  Budesonide.  Awaiting response.

## 2019-10-10 MED ORDER — BUDESONIDE 3 MG PO CPEP: 3.0000 mg | ORAL_CAPSULE | Freq: Every day | ORAL | 3 refills | Status: DC

## 2019-10-10 NOTE — Telephone Encounter (Signed)
Received approval from prior authorization for Budesoniode 3mg .  Left message on vm that I resent the prescription to the pharmacy and she should be able to pick it up today

## 2019-10-20 DIAGNOSIS — E890 Postprocedural hypothyroidism: Secondary | ICD-10-CM | POA: Diagnosis not present

## 2019-10-20 DIAGNOSIS — E049 Nontoxic goiter, unspecified: Secondary | ICD-10-CM | POA: Diagnosis not present

## 2019-10-27 DIAGNOSIS — M21241 Flexion deformity, right finger joints: Secondary | ICD-10-CM | POA: Diagnosis not present

## 2019-10-27 DIAGNOSIS — M7022 Olecranon bursitis, left elbow: Secondary | ICD-10-CM | POA: Diagnosis not present

## 2019-10-27 DIAGNOSIS — M0579 Rheumatoid arthritis with rheumatoid factor of multiple sites without organ or systems involvement: Secondary | ICD-10-CM | POA: Diagnosis not present

## 2019-10-27 DIAGNOSIS — M810 Age-related osteoporosis without current pathological fracture: Secondary | ICD-10-CM | POA: Diagnosis not present

## 2019-10-27 DIAGNOSIS — M5136 Other intervertebral disc degeneration, lumbar region: Secondary | ICD-10-CM | POA: Diagnosis not present

## 2019-10-27 DIAGNOSIS — M150 Primary generalized (osteo)arthritis: Secondary | ICD-10-CM | POA: Diagnosis not present

## 2019-10-27 DIAGNOSIS — Z6820 Body mass index (BMI) 20.0-20.9, adult: Secondary | ICD-10-CM | POA: Diagnosis not present

## 2019-11-03 DIAGNOSIS — E890 Postprocedural hypothyroidism: Secondary | ICD-10-CM | POA: Diagnosis not present

## 2019-11-03 DIAGNOSIS — E041 Nontoxic single thyroid nodule: Secondary | ICD-10-CM | POA: Diagnosis not present

## 2019-11-03 DIAGNOSIS — E049 Nontoxic goiter, unspecified: Secondary | ICD-10-CM | POA: Diagnosis not present

## 2019-11-06 DIAGNOSIS — M545 Low back pain: Secondary | ICD-10-CM | POA: Diagnosis not present

## 2019-11-07 ENCOUNTER — Other Ambulatory Visit: Payer: Medicare Other

## 2019-11-10 ENCOUNTER — Encounter: Payer: Medicare Other | Admitting: Family

## 2019-11-11 ENCOUNTER — Ambulatory Visit (INDEPENDENT_AMBULATORY_CARE_PROVIDER_SITE_OTHER): Payer: Medicare PPO | Admitting: Family

## 2019-11-11 ENCOUNTER — Other Ambulatory Visit: Payer: Self-pay

## 2019-11-11 DIAGNOSIS — Z0000 Encounter for general adult medical examination without abnormal findings: Secondary | ICD-10-CM

## 2019-11-11 NOTE — Progress Notes (Signed)
Patient ID: [name], female   DOB: 1946/06/03, 74 y.o.   MRN: [id number] This service is provided via telemedicine  No vital signs collected/recorded due to the encounter was a telemedicine visit.   Location of patient (ex: home, work):  HOME  Patient consents to a telephone visit:  YES  Location of the provider (ex: office, home):  OFFICE  Name of any referring provider:  TIFFANY REED, DO  Names of all persons participating in the telemedicine service and their role in the encounter:  PATIENT, Joan [name], [place], [place], NP  Time spent on call:  7:26

## 2019-11-11 NOTE — Patient Instructions (Signed)
Joan [name] , Thank you for taking time to come for your Medicare Wellness Visit. I appreciate your ongoing commitment to your health goals. Please review the following plan we discussed and let me know if I can assist you in the future.   Screening recommendations/referrals: Colonoscopy: Up to date  Mammogram: Up coming appointment  Bone Density: Up to date Recommended yearly ophthalmology/optometry visit for glaucoma screening and checkup Recommended yearly dental visit for hygiene and checkup  Vaccinations: Influenza vaccine : Up to date Pneumococcal vaccine : Up to date Tdap vaccine: Please verify last date that vaccine was given then up date Dr.Reed on your next visit. Shingles vaccine : Up to date   Advanced directives: No   Conditions/risks identified: Advance age female > 50 yrs,Hypertension    Next appointment: 1 year    Preventive Care 70 Years and Older, Female Preventive care refers to lifestyle choices and visits with your health care provider that can promote health and wellness. What does preventive care include?  A yearly physical exam. This is also called an annual well check.  Dental exams once or twice a year.  Routine eye exams. Ask your health care provider how often you should have your eyes checked.  Personal lifestyle choices, including:  Daily care of your teeth and gums.  Regular physical activity.  Eating a healthy diet.  Avoiding tobacco and drug use.  Limiting alcohol use.  Practicing safe sex.  Taking low-dose aspirin every day.  Taking vitamin and mineral supplements as recommended by your health care provider. What happens during an annual well check? The services and screenings done by your health care provider during your annual well check will depend on your age, overall health, lifestyle risk factors, and family history of disease. Counseling  Your health care provider may ask you questions about your:  Alcohol use.  Tobacco  use.  Drug use.  Emotional well-being.  Home and relationship well-being.  Sexual activity.  Eating habits.  History of falls.  Memory and ability to understand (cognition).  Work and work Statistician.  Reproductive health. Screening  You may have the following tests or measurements:  Height, weight, and BMI.  Blood pressure.  Lipid and cholesterol levels. These may be checked every 5 years, or more frequently if you are over 34 years old.  Skin check.  Lung cancer screening. You may have this screening every year starting at age 91 if you have a 30-pack-year history of smoking and currently smoke or have quit within the past 15 years.  Fecal occult blood test (FOBT) of the stool. You may have this test every year starting at age 62.  Flexible sigmoidoscopy or colonoscopy. You may have a sigmoidoscopy every 5 years or a colonoscopy every 10 years starting at age 42.  Hepatitis C blood test.  Hepatitis B blood test.  Sexually transmitted disease (STD) testing.  Diabetes screening. This is done by checking your blood sugar (glucose) after you have not eaten for a while (fasting). You may have this done every 1-3 years.  Bone density scan. This is done to screen for osteoporosis. You may have this done starting at age 67.  Mammogram. This may be done every 1-2 years. Talk to your health care provider about how often you should have regular mammograms. Talk with your health care provider about your test results, treatment options, and if necessary, the need for more tests. Vaccines  Your health care provider may recommend certain vaccines, such as:  Influenza  vaccine. This is recommended every year.  Tetanus, diphtheria, and acellular pertussis (Tdap, Td) vaccine. You may need a Td booster every 10 years.  Zoster vaccine. You may need this after age 39.  Pneumococcal 13-valent conjugate (PCV13) vaccine. One dose is recommended after age 73.  Pneumococcal  polysaccharide (PPSV23) vaccine. One dose is recommended after age 65. Talk to your health care provider about which screenings and vaccines you need and how often you need them. This information is not intended to replace advice given to you by your health care provider. Make sure you discuss any questions you have with your health care provider. Document Released: 09/17/2015 Document Revised: 05/10/2016 Document Reviewed: 06/22/2015 Elsevier Interactive Patient Education  2017 [place] Prevention in the Home Falls can cause injuries. They can happen to people of all ages. There are many things you can do to make your home safe and to help prevent falls. What can I do on the outside of my home?  Regularly fix the edges of walkways and driveways and fix any cracks.  Remove anything that might make you trip as you walk through a door, such as a raised step or threshold.  Trim any bushes or trees on the path to your home.  Use bright outdoor lighting.  Clear any walking paths of anything that might make someone trip, such as rocks or tools.  Regularly check to see if handrails are loose or broken. Make sure that both sides of any steps have handrails.  Any raised decks and porches should have guardrails on the edges.  Have any leaves, snow, or ice cleared regularly.  Use sand or salt on walking paths during winter.  Clean up any spills in your garage right away. This includes oil or grease spills. What can I do in the bathroom?  Use night lights.  Install grab bars by the toilet and in the tub and shower. Do not use towel bars as grab bars.  Use non-skid mats or decals in the tub or shower.  If you need to sit down in the shower, use a plastic, non-slip stool.  Keep the floor dry. Clean up any water that spills on the floor as soon as it happens.  Remove soap buildup in the tub or shower regularly.  Attach bath mats securely with double-sided non-slip rug  tape.  Do not have throw rugs and other things on the floor that can make you trip. What can I do in the bedroom?  Use night lights.  Make sure that you have a light by your bed that is easy to reach.  Do not use any sheets or blankets that are too big for your bed. They should not hang down onto the floor.  Have a firm chair that has side arms. You can use this for support while you get dressed.  Do not have throw rugs and other things on the floor that can make you trip. What can I do in the kitchen?  Clean up any spills right away.  Avoid walking on wet floors.  Keep items that you use a lot in easy-to-reach places.  If you need to reach something above you, use a strong step stool that has a grab bar.  Keep electrical cords out of the way.  Do not use floor polish or wax that makes floors slippery. If you must use wax, use non-skid floor wax.  Do not have throw rugs and other things on the floor that can  make you trip. What can I do with my stairs?  Do not leave any items on the stairs.  Make sure that there are handrails on both sides of the stairs and use them. Fix handrails that are broken or loose. Make sure that handrails are as long as the stairways.  Check any carpeting to make sure that it is firmly attached to the stairs. Fix any carpet that is loose or worn.  Avoid having throw rugs at the top or bottom of the stairs. If you do have throw rugs, attach them to the floor with carpet tape.  Make sure that you have a light switch at the top of the stairs and the bottom of the stairs. If you do not have them, ask someone to add them for you. What else can I do to help prevent falls?  Wear shoes that:  Do not have high heels.  Have rubber bottoms.  Are comfortable and fit you well.  Are closed at the toe. Do not wear sandals.  If you use a stepladder:  Make sure that it is fully opened. Do not climb a closed stepladder.  Make sure that both sides of the  stepladder are locked into place.  Ask someone to hold it for you, if possible.  Clearly mark and make sure that you can see:  Any grab bars or handrails.  First and last steps.  Where the edge of each step is.  Use tools that help you move around (mobility aids) if they are needed. These include:  Canes.  Walkers.  Scooters.  Crutches.  Turn on the lights when you go into a dark area. Replace any light bulbs as soon as they burn out.  Set up your furniture so you have a clear path. Avoid moving your furniture around.  If any of your floors are uneven, fix them.  If there are any pets around you, be aware of where they are.  Review your medicines with your doctor. Some medicines can make you feel dizzy. This can increase your chance of falling. Ask your doctor what other things that you can do to help prevent falls. This information is not intended to replace advice given to you by your health care provider. Make sure you discuss any questions you have with your health care provider. Document Released: 06/17/2009 Document Revised: 01/27/2016 Document Reviewed: 09/25/2014 Elsevier Interactive Patient Education  2017 [employer].

## 2019-11-11 NOTE — Progress Notes (Signed)
Subjective:   Joan [name] is a 74 y.o. female who presents for Medicare Annual (Subsequent) preventive examination.  Review of Systems:  Cardiac Risk Factors include: advanced age (>24men, >82 women);hypertension     Objective:     Vitals: There were no vitals taken for this visit.  There is no height or weight on file to calculate BMI.  Advanced Directives 11/05/2018 11/05/2018 02/18/2018 12/03/2017 10/22/2017 08/02/2017 04/02/2017  Does Patient Have a Medical Advance Directive? No No No Yes No No No  Type of Advance Directive - - - [place] in Chart? - - - No - copy requested - - -  Would patient like information on creating a medical advance directive? No - Patient declined (No Data) No - Patient declined - Yes (MAU/Ambulatory/Procedural Areas - Information given) - No - Patient declined    Tobacco Social History   Tobacco Use  Smoking Status Never Smoker  Smokeless Tobacco Never Used     Counseling given: Not Answered   Clinical Intake:  Pre-visit preparation completed: No  Pain : No/denies pain     BMI - recorded: 20.12 Nutritional Status: BMI of 19-24  Normal Nutritional Risks: (chronic diarrhea) Diabetes: No  How often do you need to have someone help you when you read instructions, pamphlets, or other written materials from your doctor or pharmacy?: 1 - Never What is the last grade level you completed in school?: 12 Grqde  Interpreter Needed?: No  Information entered by :: Skyra Crichlow FNP-C  Past Medical History:  Diagnosis Date  . Alopecia areata 02/16/2006  . Anxiety state, unspecified 2004  . Benign paroxysmal positional vertigo 04/01/03  . Cancer ([place])    skin   . Cervicalgia 04/01/03  . Chronic rhinitis 09/01/05  . Corns and callosities   . Dermatophytosis of nail   . Diarrhea 04/15/2015  . Diverticulosis   . Dysphagia, unspecified(787.20) 2004  . Enthesopathy of ankle and tarsus,  unspecified 03/31/96  . External hemorrhoids without mention of complication   . 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 Fibromyalgia   . Hypertension 2004  . Hypothyroidism   . Impacted cerumen    right ear  . Lumbago   . Myalgia and myositis, unspecified 03/01/05  . Nontoxic uninodular goiter   . Osteoarthrosis, unspecified whether generalized or localized, unspecified site 04/01/03  . Other left bundle branch block   . Rheumatoid arthritis(714.0) 10/05/2004   lumbar spondylosis. Lumbo-sacral anterolisthesis    . Senile osteoporosis 04/01/03  . Sprain of metatarsophalangeal (joint) of foot   . Thyrotoxicosis   . Thyrotoxicosis without mention of goiter or other cause, without mention of thyrotoxic crisis or storm   . Unspecified vitamin D deficiency   . Urinary frequency    Past Surgical History:  Procedure Laterality Date  . COLONOSCOPY    . COLONOSCOPY N/A 04/15/2015   Procedure: COLONOSCOPY;  Surgeon: Gatha Mayer, MD;  Location: [facility];  Service: Endoscopy;  Laterality: N/A;  . DG FINGERS, MULTIPLE LT HAND ([place] HX)    . FOOT SURGERY Bilateral 05/22/2014   Dr.Strom   . LUMBAR DISC SURGERY  12/12/2017   Dr Arnoldo Morale  . [place]   DR [place]   . TONSILLECTOMY AND ADENOIDECTOMY  1974  . TUBAL LIGATION  1973   Family History  Problem Relation Age of  Onset  . Heart attack Father        age 64  . Heart disease Father   . Colon cancer Neg Hx   . Esophageal cancer Neg Hx   . Rectal cancer Neg Hx   . Stomach cancer Neg Hx    Social History   Socioeconomic History  . Marital status: Married    Spouse name: Percell Miller  . Number of children: 2  . Years of education: Not on file  . Highest education level: Not on file  Occupational History  . Occupation: retired  Tobacco Use  . Smoking status: Never Smoker  . Smokeless tobacco: Never Used  Substance and Sexual Activity  . Alcohol use: No     Alcohol/week: 0.0 standard drinks  . Drug use: No  . Sexual activity: Yes  Other Topics Concern  . Not on file  Social History Narrative  . Not on file   Social Determinants of Health   Financial Resource Strain:   . Difficulty of Paying Living Expenses: Not on file  Food Insecurity:   . Worried About Charity fundraiser in the Last Year: Not on file  . Ran Out of Food in the Last Year: Not on file  Transportation Needs:   . Lack of Transportation (Medical): Not on file  . Lack of Transportation (Non-Medical): Not on file  Physical Activity:   . Days of Exercise per Week: Not on file  . Minutes of Exercise per Session: Not on file  Stress:   . Feeling of Stress : Not on file  Social Connections:   . Frequency of Communication with Friends and Family: Not on file  . Frequency of Social Gatherings with Friends and Family: Not on file  . Attends Religious Services: Not on file  . Active Member of Clubs or Organizations: Not on file  . Attends Archivist Meetings: Not on file  . Marital Status: Not on file    Outpatient Encounter Medications as of 11/11/2019  Medication Sig  . acetaminophen (TYLENOL) 500 MG tablet Take 1,000 mg by mouth as needed for mild pain or moderate pain.   Marland Kitchen amitriptyline (ELAVIL) 10 MG tablet TAKE 1 TABLET BY MOUTH AT BEDTIME TO  HELP  RELAX  MUSCLES  . Biotin 1000 MCG tablet Take 1 tablet (1 mg total) by mouth 2 (two) times daily.  . budesonide (ENTOCORT EC) 3 MG 24 hr capsule Take 1 capsule (3 mg total) by mouth daily.  . Budesonide ER (UCERIS) 9 MG TB24 Take 1 capsule by mouth daily.  . Calcium Carb-Cholecalciferol (CALTRATE 600+D3) 600-800 MG-UNIT TABS Take 1 tablet by mouth 2 (two) times daily.  . Cholecalciferol (VITAMIN D3) 50 MCG (2000 UT) capsule Take 1 capsule (2,000 Units total) by mouth daily.  . colestipol (COLESTID) 1 g tablet Take 2 tablets (2 g total) by mouth 2 (two) times daily.  . cycloSPORINE (RESTASIS) 0.05 % ophthalmic  emulsion Place 1 drop into both eyes 2 (two) times daily.  Marland Kitchen denosumab (PROLIA) 60 MG/ML SOSY injection Inject 60 mg into the skin every 6 (six) months.  . diphenoxylate-atropine (LOMOTIL) 2.5-0.025 MG tablet TAKE 1 TO 2 TABLETS BY MOUTH EVERY 8 HOURS AS NEEDED  . etanercept (ENBREL) 50 MG/ML injection Inject 50 mg into the skin once a week.  . fluticasone (FLONASE) 50 MCG/ACT nasal spray Place 1 spray into both nostrils daily for 14 days.  . folic acid (FOLVITE) 1 MG tablet Take 1 mg by mouth daily.  Marland Kitchen  Glucosamine-Chondroit-Vit C-Mn (GLUCOSAMINE CHONDR 500 COMPLEX PO) Take 2 tablets by mouth daily.   . hydrochlorothiazide (HYDRODIURIL) 25 MG tablet Take 1 tablet by mouth once daily  . losartan (COZAAR) 100 MG tablet Take 1 tablet by mouth once daily  . Menthol, Topical Analgesic, (ASPERCREME MAX ROLL-ON EX) Apply topically.  . Multiple Vitamin (MULTIVITAMIN) tablet Take 1 tablet by mouth daily.   . naproxen (NAPROSYN) 250 MG tablet Take 250 mg by mouth 2 (two) times daily with a meal.  . Omega-3 Fatty Acids (FISH OIL) 1000 MG CAPS Take 1,000 mg by mouth daily.   Marland Kitchen omeprazole (PRILOSEC) 40 MG capsule Take 1 capsule (40 mg total) by mouth daily.  . predniSONE (DELTASONE) 5 MG tablet TAKE 1 TABLET BY MOUTH WITH BREAKFAST  . Propylene Glycol (SYSTANE BALANCE) 0.6 % SOLN Place 1 drop into both eyes 2 (two) times daily as needed (dry eyes).   . traMADol (ULTRAM) 50 MG tablet Take by mouth 2 (two) times daily as needed.  . urea (CARMOL) 10 % cream Apply topically as needed.   No facility-administered encounter medications on file as of 11/11/2019.    Activities of Daily Living In your present state of health, do you have any difficulty performing the following activities: 11/11/2019  Hearing? N  Vision? N  Difficulty concentrating or making decisions? N  Walking or climbing stairs? Y  Comment hx of back surgery  Dressing or bathing? N  Doing errands, shopping? N  Preparing Food and eating ? N    Using the Toilet? N  In the past six months, have you accidently leaked urine? N  Do you have problems with loss of bowel control? N  Managing your Medications? N  Managing your Finances? N  Housekeeping or managing your Housekeeping? N  Some recent data might be hidden    Patient Care Team: Gayland Curry, DO as PCP - General (Geriatric Medicine) Hennie Duos, MD as Consulting Physician (Rheumatology) Altheimer, Legrand Como, MD as Consulting Physician (Endocrinology) Newman Pies, MD as Consulting Physician (Neurosurgery) Clydell Hakim, MD as Consulting Physician (Anesthesiology) Rolla Flatten, MD as Consulting Physician (Diagnostic Radiology) Jolene Schimke, MD as Referring Physician (Dermatology) Lynnell Dike, OD as Consulting Physician (Optometry)    Assessment:   This is a routine wellness examination for Lorraina.  Exercise Activities and Dietary recommendations Current Exercise Habits: Home exercise routine, Type of exercise: walking, Time (Minutes): 30, Frequency (Times/Week): 3, Weekly Exercise (Minutes/Week): 90, Intensity: Moderate, Exercise limited by: None identified  Goals    . Increase water intake     Starting 10/31/16, I will attempt to increase my water intake by 2 more glasses daily, for a total of 6 glasses per day.       Fall Risk Fall Risk  11/11/2019 11/05/2018 11/05/2018 10/28/2018 09/10/2018  Falls in the past year? 0 0 0 0 0  Number falls in past yr: 0 0 0 0 0  Comment - - - - -  Injury with Fall? 0 0 0 0 0  Follow up - - - - -   Is the patient's home free of loose throw rugs in walkways, pet beds, electrical cords, etc?   yes      Grab bars in the bathroom? no      Handrails on the stairs?   no      Adequate lighting?   yes  Depression Screen PHQ 2/9 Scores 11/11/2019 11/05/2018 10/28/2018 07/08/2018  PHQ - 2 Score 0 0 0 0  Cognitive Function MMSE - Mini Mental State Exam 11/05/2018 10/22/2017 10/31/2016 10/31/2016  Orientation to time 5 5 5 5    Orientation to Place 5 5 5 5   Registration 3 3 3  -  Attention/ Calculation 5 5 5  -  Recall 2 2 2  -  Language- name 2 objects 2 2 2  -  Language- repeat 1 1 1  -  Language- follow 3 step command 3 3 2  -  Language- read & follow direction 1 1 1  -  Write a sentence 1 1 1  -  Copy design 1 1 1  -  Total score 29 29 28  -     6CIT Screen 11/11/2019  What Year? 0 points  What month? 0 points  What time? 0 points  Count back from 20 0 points  Months in reverse 0 points  Repeat phrase 4 points  Total Score 4    Immunization History  Administered Date(s) Administered  . Fluad Quad(high Dose 65+) 05/22/2019  . Influenza, High Dose Seasonal PF 06/04/2013, 06/24/2014, 07/06/2015, 05/05/2016, 06/13/2016, 06/25/2017, 07/08/2018, 05/22/2019  . Influenza-Unspecified 06/04/2013, 06/24/2014, 07/06/2015, 05/05/2016, 06/13/2016  . Moderna SARS-COVID-2 Vaccination 10/13/2019  . Pneumococcal Conjugate-13 10/06/2015  . Pneumococcal Polysaccharide-23 10/31/2016  . Pneumococcal-Unspecified 09/04/2006  . Td 09/04/2005  . Tdap 09/04/2005  . Zoster 11/10/2008  . Zoster Recombinat (Shingrix) 02/19/2018, 07/15/2018    Qualifies for Shingles Vaccine? Up to date  Screening Tests Health Maintenance  Topic Date Due  . Hepatitis C Screening  Aug 24, 1946  . TETANUS/TDAP  09/05/2015  . MAMMOGRAM  03/13/2021  . COLONOSCOPY  08/26/2029  . INFLUENZA VACCINE  Completed  . DEXA SCAN  Completed  . PNA vac Low Risk Adult  Completed    Cancer Screenings:  Lung: Low Dose CT Chest recommended if Age 91-80 years, 30 pack-year currently smoking OR have quit w/in 15years. Patient does not qualify. Breast:  Up to date on Mammogram? Yes   Up to date of Bone Density/Dexa? Yes Colorectal:Up to date   Additional Screenings: Hepatitis C Screening: Low risk      Plan:  - will verify with previous provider if she had Tdap vaccine then update Dr.Reed on upcoming visit.  - Discuss Hep C screening with Dr.Reed.   I  have personally reviewed and noted the following in the patient's chart:   . Medical and social history . Use of alcohol, tobacco or illicit drugs  . Current medications and supplements . Functional ability and status . Nutritional status . Physical activity . Advanced directives . List of other physicians . Hospitalizations, surgeries, and ER visits in previous 12 months . Vitals . Screenings to include cognitive, depression, and falls . Referrals and appointments  In addition, I have reviewed and discussed with patient certain preventive protocols, quality metrics, and best practice recommendations. A written personalized care plan for preventive services as well as general preventive health recommendations were provided to patient.   Sandrea Hughs, NP  11/11/2019

## 2019-11-13 ENCOUNTER — Other Ambulatory Visit: Payer: Self-pay | Admitting: Internal Medicine

## 2019-11-17 ENCOUNTER — Other Ambulatory Visit: Payer: Self-pay

## 2019-11-17 ENCOUNTER — Other Ambulatory Visit: Payer: Medicare PPO

## 2019-11-17 DIAGNOSIS — E781 Pure hyperglyceridemia: Secondary | ICD-10-CM | POA: Diagnosis not present

## 2019-11-17 DIAGNOSIS — R7303 Prediabetes: Secondary | ICD-10-CM | POA: Diagnosis not present

## 2019-11-17 DIAGNOSIS — N289 Disorder of kidney and ureter, unspecified: Secondary | ICD-10-CM

## 2019-11-18 ENCOUNTER — Encounter: Payer: Self-pay | Admitting: *Deleted

## 2019-11-18 LAB — CBC WITH DIFFERENTIAL/PLATELET
Absolute Monocytes: 1161 cells/uL — ABNORMAL HIGH (ref 200–950)
Basophils Absolute: 63 cells/uL (ref 0–200)
Basophils Relative: 0.7 %
Eosinophils Absolute: 252 cells/uL (ref 15–500)
Eosinophils Relative: 2.8 %
HCT: 32.5 % — ABNORMAL LOW (ref 35.0–45.0)
Hemoglobin: 10.4 g/dL — ABNORMAL LOW (ref 11.7–15.5)
Lymphs Abs: 3537 cells/uL (ref 850–3900)
MCH: 28 pg (ref 27.0–33.0)
MCHC: 32 g/dL (ref 32.0–36.0)
MCV: 87.4 fL (ref 80.0–100.0)
MPV: 10.1 fL (ref 7.5–12.5)
Monocytes Relative: 12.9 %
Neutro Abs: 3987 cells/uL (ref 1500–7800)
Neutrophils Relative %: 44.3 %
Platelets: 310 10*3/uL (ref 140–400)
RBC: 3.72 10*6/uL — ABNORMAL LOW (ref 3.80–5.10)
RDW: 13.1 % (ref 11.0–15.0)
Total Lymphocyte: 39.3 %
WBC: 9 10*3/uL (ref 3.8–10.8)

## 2019-11-18 LAB — COMPLETE METABOLIC PANEL WITH GFR
AG Ratio: 1.5 (calc) (ref 1.0–2.5)
ALT: 9 U/L (ref 6–29)
AST: 12 U/L (ref 10–35)
Albumin: 3.4 g/dL — ABNORMAL LOW (ref 3.6–5.1)
Alkaline phosphatase (APISO): 46 U/L (ref 37–153)
BUN/Creatinine Ratio: 31 (calc) — ABNORMAL HIGH (ref 6–22)
BUN: 26 mg/dL — ABNORMAL HIGH (ref 7–25)
CO2: 31 mmol/L (ref 20–32)
Calcium: 8.5 mg/dL — ABNORMAL LOW (ref 8.6–10.4)
Chloride: 102 mmol/L (ref 98–110)
Creat: 0.85 mg/dL (ref 0.60–0.93)
GFR, Est African American: 79 mL/min/{1.73_m2} (ref >=60)
GFR, Est Non African American: 68 mL/min/{1.73_m2} (ref >=60)
Globulin: 2.3 g/dL (calc) (ref 1.9–3.7)
Glucose, Bld: 82 mg/dL (ref 65–99)
Potassium: 3.9 mmol/L (ref 3.5–5.3)
Sodium: 140 mmol/L (ref 135–146)
Total Bilirubin: 0.3 mg/dL (ref 0.2–1.2)
Total Protein: 5.7 g/dL — ABNORMAL LOW (ref 6.1–8.1)

## 2019-11-18 LAB — HEMOGLOBIN A1C
Hgb A1c MFr Bld: 5.2 % of total Hgb (ref <5.7)
Mean Plasma Glucose: 103 (calc)
eAG (mmol/L): 5.7 (calc)

## 2019-11-18 LAB — LIPID PANEL
Cholesterol: 172 mg/dL (ref <200)
HDL: 71 mg/dL (ref >=50)
LDL Cholesterol (Calc): 78 mg/dL (calc)
Non-HDL Cholesterol (Calc): 101 mg/dL (calc) (ref <130)
Total CHOL/HDL Ratio: 2.4 (calc) (ref <5.0)
Triglycerides: 134 mg/dL (ref <150)

## 2019-11-18 NOTE — Progress Notes (Signed)
Sugar average has come down to normal. Cholesterol panel remains good Electrolytes and liver are ok (calcium slightly low but I think due to her low protein in her system) She is a bit more anemic again--appears this oscillates looking back over time. We will review at her visit.

## 2019-11-24 ENCOUNTER — Other Ambulatory Visit: Payer: Self-pay

## 2019-11-24 ENCOUNTER — Ambulatory Visit: Payer: Medicare PPO | Admitting: Internal Medicine

## 2019-11-24 ENCOUNTER — Encounter: Payer: Self-pay | Admitting: Internal Medicine

## 2019-11-24 VITALS — BP 128/68 | HR 74 | Temp 97.5°F | Ht <= 58 in | Wt 105.0 lb

## 2019-11-24 DIAGNOSIS — G8929 Other chronic pain: Secondary | ICD-10-CM | POA: Diagnosis not present

## 2019-11-24 DIAGNOSIS — H6123 Impacted cerumen, bilateral: Secondary | ICD-10-CM

## 2019-11-24 DIAGNOSIS — M5440 Lumbago with sciatica, unspecified side: Secondary | ICD-10-CM

## 2019-11-24 DIAGNOSIS — D692 Other nonthrombocytopenic purpura: Secondary | ICD-10-CM

## 2019-11-24 DIAGNOSIS — M059 Rheumatoid arthritis with rheumatoid factor, unspecified: Secondary | ICD-10-CM | POA: Diagnosis not present

## 2019-11-24 DIAGNOSIS — R7303 Prediabetes: Secondary | ICD-10-CM

## 2019-11-24 DIAGNOSIS — D649 Anemia, unspecified: Secondary | ICD-10-CM | POA: Diagnosis not present

## 2019-11-24 DIAGNOSIS — K52838 Other microscopic colitis: Secondary | ICD-10-CM

## 2019-11-24 NOTE — Progress Notes (Signed)
Location:  [facility] clinic  Provider: Dr. Hollace Kinnier  Goals of Care:  Advanced Directives 11/24/2019  Does Patient Have a Medical Advance Directive? No  Type of Advance Directive -  Copy of [place] in Chart? -  Would patient like information on creating a medical advance directive? Yes (ED - Information included in AVS)     Chief Complaint  Patient presents with  . Medical Management of Chronic Issues    6 month follow up and lab results, ear may have wax     HPI: Patient is a 74 y.o. female seen today for medical management of chronic diseases.    Labs reviewed with patient.   She averages two meals daily. Each meal has a serving of protein. In the morning she likes to drink milk, her other meal consists of chicken or beef. She likes to eat [employer] once a week and will get the kids meal. Also like to eat greens like collards a few times a week.   She feels her right ear canal may be full of wax. States she has a small right ear canal. Would like ear checked today. Has not tried any interventions. No issues with hearing.   Her back pain varies. She has had back surgery in the past. Still takes tramadol, 1-3 pills a day. A few months back she lifted a heavy heater and was worried she reinjured her back. She was cleared by her surgeon and is better since incident. Tries to avoid heavy lifting. Does light walking for exercise.   No recent falls or injuries.   Still suffering from chronic diarrhea. Followed by Dr. Henrene Pastor. Diarrhea varies between loose and watery. Does not take lomotil or coletupol daily. Next appointment in April 2021.           Past Medical History:  Diagnosis Date  . Alopecia areata 02/16/2006  . Anxiety state, unspecified 2004  . Benign paroxysmal positional vertigo 04/01/03  . Cancer ([place])    skin   . Cervicalgia 04/01/03  . Chronic rhinitis 09/01/05  . Corns and callosities   . Dermatophytosis of nail   . Diarrhea 04/15/2015  .  Diverticulosis   . Dysphagia, unspecified(787.20) 2004  . Enthesopathy of ankle and tarsus, unspecified 03/31/96  . External hemorrhoids without mention of complication   . 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 Fibromyalgia   . Hypertension 2004  . Hypothyroidism   . Impacted cerumen    right ear  . Lumbago   . Myalgia and myositis, unspecified 03/01/05  . Nontoxic uninodular goiter   . Osteoarthrosis, unspecified whether generalized or localized, unspecified site 04/01/03  . Other left bundle branch block   . Rheumatoid arthritis(714.0) 10/05/2004   lumbar spondylosis. Lumbo-sacral anterolisthesis    . Senile osteoporosis 04/01/03  . Sprain of metatarsophalangeal (joint) of foot   . Thyrotoxicosis   . Thyrotoxicosis without mention of goiter or other cause, without mention of thyrotoxic crisis or storm   . Unspecified vitamin D deficiency   . Urinary frequency     Past Surgical History:  Procedure Laterality Date  . COLONOSCOPY    . COLONOSCOPY N/A 04/15/2015   Procedure: COLONOSCOPY;  Surgeon: Gatha Mayer, MD;  Location: [facility];  Service: Endoscopy;  Laterality: N/A;  . DG FINGERS, MULTIPLE LT HAND ([place] HX)    . FOOT SURGERY Bilateral 05/22/2014   Dr.Strom   . LUMBAR DISC SURGERY  12/12/2017   Dr Arnoldo Morale  . REMOVE GANGLION  [facility]   . TONSILLECTOMY AND ADENOIDECTOMY  1974  . TUBAL LIGATION  1973    Allergies  Allergen Reactions  . Arava [Leflunomide] Diarrhea, Nausea Only and Other (See Comments)    Loss appetite  . Penicillins Rash and Other (See Comments)    PATIENT HAS HAD A PCN REACTION WITH IMMEDIATE RASH, FACIAL/TONGUE/THROAT SWELLING, SOB, OR LIGHTHEADEDNESS WITH HYPOTENSION:  #  #  YES  #  #  Has patient had a PCN reaction causing severe rash involving mucus membranes or skin necrosis: no Has patient had a PCN reaction that required hospitalization: no   . Methotrexate Derivatives Other  (See Comments)    Hair loss  . Neurontin [Gabapentin] Nausea And Vomiting    Outpatient Encounter Medications as of 11/24/2019  Medication Sig  . acetaminophen (TYLENOL) 500 MG tablet Take 1,000 mg by mouth as needed for mild pain or moderate pain.   Marland Kitchen amitriptyline (ELAVIL) 10 MG tablet TAKE 1 TABLET BY MOUTH AT BEDTIME TO  HELP  RELAX  MUSCLES  . Biotin 1000 MCG tablet Take 1 tablet (1 mg total) by mouth 2 (two) times daily.  . budesonide (ENTOCORT EC) 3 MG 24 hr capsule Take 1 capsule (3 mg total) by mouth daily.  . Budesonide ER (UCERIS) 9 MG TB24 Take 1 capsule by mouth daily.  . Calcium Carb-Cholecalciferol (CALTRATE 600+D3) 600-800 MG-UNIT TABS Take 1 tablet by mouth 2 (two) times daily.  . Cholecalciferol (VITAMIN D3) 50 MCG (2000 UT) capsule Take 1 capsule (2,000 Units total) by mouth daily.  . colestipol (COLESTID) 1 g tablet Take 2 tablets (2 g total) by mouth 2 (two) times daily.  . cycloSPORINE (RESTASIS) 0.05 % ophthalmic emulsion Place 1 drop into both eyes 2 (two) times daily.  Marland Kitchen denosumab (PROLIA) 60 MG/ML SOSY injection Inject 60 mg into the skin every 6 (six) months.  . diphenoxylate-atropine (LOMOTIL) 2.5-0.025 MG tablet TAKE 1 TO 2 TABLETS BY MOUTH EVERY 8 HOURS AS NEEDED  . etanercept (ENBREL) 50 MG/ML injection Inject 50 mg into the skin once a week.  . fluticasone (FLONASE) 50 MCG/ACT nasal spray Place 1 spray into both nostrils daily for 14 days.  . folic acid (FOLVITE) 1 MG tablet Take 1 mg by mouth daily.  . Glucosamine-Chondroit-Vit C-Mn (GLUCOSAMINE CHONDR 500 COMPLEX PO) Take 2 tablets by mouth daily.   . hydrochlorothiazide (HYDRODIURIL) 25 MG tablet Take 1 tablet by mouth once daily  . losartan (COZAAR) 100 MG tablet Take 1 tablet by mouth once daily  . Menthol, Topical Analgesic, (ASPERCREME MAX ROLL-ON EX) Apply topically.  . Multiple Vitamin (MULTIVITAMIN) tablet Take 1 tablet by mouth daily.   . naproxen (NAPROSYN) 250 MG tablet Take 250 mg by mouth 2  (two) times daily with a meal.  . Omega-3 Fatty Acids (FISH OIL) 1000 MG CAPS Take 1,000 mg by mouth daily.   Marland Kitchen omeprazole (PRILOSEC) 40 MG capsule Take 1 capsule (40 mg total) by mouth daily.  . predniSONE (DELTASONE) 5 MG tablet TAKE 1 TABLET BY MOUTH WITH BREAKFAST  . Propylene Glycol (SYSTANE BALANCE) 0.6 % SOLN Place 1 drop into both eyes 2 (two) times daily as needed (dry eyes).   . traMADol (ULTRAM) 50 MG tablet Take by mouth 2 (two) times daily as needed.  . urea (CARMOL) 10 % cream Apply topically as needed.   No facility-administered encounter medications on file as  of 11/24/2019.    Review of Systems:  Review of Systems  Constitutional: Negative for activity change, appetite change and fatigue.  HENT: Negative for dental problem, hearing loss and trouble swallowing.   Eyes: Negative for photophobia and visual disturbance.  Respiratory: Negative for cough and shortness of breath.   Cardiovascular: Negative for chest pain and leg swelling.  Gastrointestinal: Positive for diarrhea. Negative for abdominal pain, constipation and nausea.  Endocrine: Negative for polydipsia, polyphagia and polyuria.  Genitourinary: Negative for dysuria, frequency and hematuria.  Musculoskeletal: Positive for arthralgias and back pain.  Skin: Negative.   Neurological: Negative for dizziness, weakness and headaches.  Psychiatric/Behavioral: Negative for dysphoric mood and sleep disturbance. The patient is not nervous/anxious.     Health Maintenance  Topic Date Due  . TETANUS/TDAP  11/24/2019 (Originally 09/05/2015)  . Hepatitis C Screening  11/24/2019 (Originally May 29, 1946)  . MAMMOGRAM  03/13/2021  . COLONOSCOPY  08/26/2029  . INFLUENZA VACCINE  Completed  . DEXA SCAN  Completed  . PNA vac Low Risk Adult  Completed    Physical Exam: Vitals:   11/24/19 0857  BP: 128/68  Pulse: 74  Temp: (!) 97.5 F (36.4 C)  TempSrc: Temporal  SpO2: 94%  Weight: 105 lb (47.6 kg)  Height: 4\' 8"  (1.422  m)   Body mass index is 23.54 kg/m. Physical Exam Vitals reviewed.  Constitutional:      Appearance: Normal appearance. She is normal weight.  HENT:     Head: Normocephalic.     Right Ear: There is impacted cerumen.     Left Ear: There is impacted cerumen.  Cardiovascular:     Rate and Rhythm: Normal rate and regular rhythm.     Pulses: Normal pulses.     Heart sounds: Normal heart sounds. No murmur.  Pulmonary:     Effort: Pulmonary effort is normal. No respiratory distress.     Breath sounds: Normal breath sounds.  Abdominal:     General: Abdomen is flat. Bowel sounds are normal.     Palpations: Abdomen is soft.     Tenderness: There is no abdominal tenderness.     Hernia: No hernia is present.  Musculoskeletal:        General: Normal range of motion.     Right lower leg: No edema.     Left lower leg: No edema.  Skin:    General: Skin is warm and dry.     Capillary Refill: Capillary refill takes less than 2 seconds.  Neurological:     General: No focal deficit present.     Mental Status: She is alert and oriented to person, place, and time. Mental status is at baseline.  Psychiatric:        Mood and Affect: Mood normal.        Behavior: Behavior normal.        Thought Content: Thought content normal.        Judgment: Judgment normal.     Labs reviewed: Basic Metabolic Panel: Recent Labs    05/14/19 0914 05/22/19 0959 11/17/19 0859  NA 140 137 140  K 3.6 4.3 3.9  CL 102 103 102  CO2 31 28 31   GLUCOSE 82 96 82  BUN 32* 25 26*  CREATININE 1.03* 0.90 0.85  CALCIUM 8.6 8.5* 8.5*   Liver Function Tests: Recent Labs    05/14/19 0914 11/17/19 0859  AST 11 12  ALT 9 9  BILITOT 0.3 0.3  PROT 5.8* 5.7*   No results  for input(s): LIPASE, AMYLASE in the last 8760 hours. No results for input(s): AMMONIA in the last 8760 hours. CBC: Recent Labs    05/14/19 0914 11/17/19 0859  WBC 9.0 9.0  NEUTROABS 4,158 3,987  HGB 11.6* 10.4*  HCT 35.5 32.5*  MCV  89.0 87.4  PLT 311 310   Lipid Panel: Recent Labs    05/14/19 0914 11/17/19 0859  CHOL 143 172  HDL 53 71  LDLCALC 72 78  TRIG 95 134  CHOLHDL 2.7 2.4   Lab Results  Component Value Date   HGBA1C 5.2 11/17/2019    Procedures since last visit: No results found.  Assessment/Plan 1. Chronic right-sided low back pain with sciatica, sciatica laterality unspecified - ongoing, stable at this time - she is using less tramadol since recent back surgery - avoid heavy lifting - recommend light walking a few times a week  2. Bilateral hearing loss due to cerumen impaction - TM not visible in left and right ears due to cerumen - ear lavage today - recommend OTC debrox drops to prevent future buildup  3. Normocytic anemia - suspect numerous chronic conditions and poor diet as contributing factor - cbc with differential/platelets- future - iron, TIBC, ferritin panel- future - vitamin B12- future  4. Prediabetes - A1C 5.2, she has a long history of prednisone use - recommend diet low in sugar and carbohydrates - hemoglobin A1C- future  5. Rheumatoid arthritis with positive rheumatoid factor, involving unspecified site ([place]) - stable at this time, pain manageable - followed by rheumatology - continue Embrel regimen  6. Senile purpura ([facility]) - stable at this time - no new areas inspected  7. Other microscopic colitis - followed by Dr. Henrene Pastor - diarrhea varies between loose and watery daily - continue regimen of colestipol, lomotil, and entocort EC - recommend using metamucil when diarrhea is watery - continue diet high in fiber  - basic metabolic panel- today      Labs/tests ordered: Basic metabolic panel, cbc with differential/platelets, Iron, TIBC and ferritin panel, vitamin B12- future Next appt:  Visit date not found

## 2019-12-02 DIAGNOSIS — M4150 Other secondary scoliosis, site unspecified: Secondary | ICD-10-CM | POA: Diagnosis not present

## 2019-12-02 DIAGNOSIS — I10 Essential (primary) hypertension: Secondary | ICD-10-CM | POA: Diagnosis not present

## 2019-12-02 DIAGNOSIS — M545 Low back pain: Secondary | ICD-10-CM | POA: Diagnosis not present

## 2019-12-04 ENCOUNTER — Encounter: Payer: Self-pay | Admitting: Internal Medicine

## 2019-12-04 ENCOUNTER — Ambulatory Visit: Payer: Medicare PPO | Admitting: Internal Medicine

## 2019-12-04 VITALS — BP 108/64 | HR 80 | Temp 98.5°F | Ht <= 58 in | Wt 104.0 lb

## 2019-12-04 DIAGNOSIS — K222 Esophageal obstruction: Secondary | ICD-10-CM

## 2019-12-04 DIAGNOSIS — R1310 Dysphagia, unspecified: Secondary | ICD-10-CM

## 2019-12-04 DIAGNOSIS — K219 Gastro-esophageal reflux disease without esophagitis: Secondary | ICD-10-CM

## 2019-12-04 DIAGNOSIS — R1319 Other dysphagia: Secondary | ICD-10-CM

## 2019-12-04 DIAGNOSIS — K52839 Microscopic colitis, unspecified: Secondary | ICD-10-CM

## 2019-12-04 MED ORDER — BUDESONIDE 3 MG PO CPEP: 3.0000 mg | ORAL_CAPSULE | Freq: Every day | ORAL | 3 refills | Status: DC

## 2019-12-04 NOTE — Patient Instructions (Signed)
We have sent the following medications to your pharmacy for you to pick up at your convenience:  Budesonide 3mg .  Please follow up on 03/04/2020 at 2:00pm

## 2019-12-04 NOTE — Progress Notes (Signed)
HISTORY OF PRESENT ILLNESS:  Joan [name] is a 74 y.o. female with multiple medical problems as listed below who follows up today regarding management of her microscopic colitis diagnosed December 2020.  She was last seen in this office October 06, 2019.  At that time she was on budesonide 9 mg daily with improvement in symptoms.  See that dictation for details.  She apparently had difficulty with coverage of her medication.  Thus she was prescribed 3 mg extended release form and has been on just 3 mg daily.  She tells me that she occasionally has diarrhea after eating.  However no diarrhea in the past 3 or 4 days.  No bowel movement in 2 days.  No new problems except for back pain from a traumatic fall 1 week ago.  She did seek medical attention and was evaluated.  No serious damage.  She is accompanied today by her husband.  Laboratories from November 17, 2019 show unremarkable comprehensive metabolic panel.  Normal CBC except for hemoglobin 10.4.  REVIEW OF SYSTEMS:  All non-GI ROS negative unless otherwise stated in the HPI except for back pain, arthritis  Past Medical History:  Diagnosis Date  . Alopecia areata 02/16/2006  . Anxiety state, unspecified 2004  . Benign paroxysmal positional vertigo 04/01/03  . Cancer ([place])    skin   . Cervicalgia 04/01/03  . Chronic rhinitis 09/01/05  . Corns and callosities   . Dermatophytosis of nail   . Diarrhea 04/15/2015  . Diverticulosis   . Dysphagia, unspecified(787.20) 2004  . Enthesopathy of ankle and tarsus, unspecified 03/31/96  . External hemorrhoids without mention of complication   . 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 Fibromyalgia   . Hypertension 2004  . Hypothyroidism   . Impacted cerumen    right ear  . Lumbago   . Myalgia and myositis, unspecified 03/01/05  . Nontoxic uninodular goiter   . Osteoarthrosis, unspecified whether generalized or localized, unspecified site 04/01/03  . Other left bundle branch block   . Rheumatoid  arthritis(714.0) 10/05/2004   lumbar spondylosis. Lumbo-sacral anterolisthesis    . Senile osteoporosis 04/01/03  . Sprain of metatarsophalangeal (joint) of foot   . Thyrotoxicosis   . Thyrotoxicosis without mention of goiter or other cause, without mention of thyrotoxic crisis or storm   . Unspecified vitamin D deficiency   . Urinary frequency     Past Surgical History:  Procedure Laterality Date  . COLONOSCOPY    . COLONOSCOPY N/A 04/15/2015   Procedure: COLONOSCOPY;  Surgeon: Gatha Mayer, MD;  Location: [facility];  Service: Endoscopy;  Laterality: N/A;  . DG FINGERS, MULTIPLE LT HAND ([place] HX)    . FOOT SURGERY Bilateral 05/22/2014   Dr.Strom   . LUMBAR DISC SURGERY  12/12/2017   Dr Arnoldo Morale  . [place]   DR [place]   . TONSILLECTOMY AND ADENOIDECTOMY  1974  . TUBAL LIGATION  1973    Social History Aryahi A Odwyer  reports that she has never smoked. She has never used smokeless tobacco. She reports that she does not drink alcohol or use drugs.  family history includes Heart attack in her father; Heart disease in her father.  Allergies  Allergen Reactions  . Arava [Leflunomide] Diarrhea, Nausea Only and Other (See Comments)    Loss appetite  . Penicillins Rash and Other (See Comments)    PATIENT HAS HAD A PCN  REACTION WITH IMMEDIATE RASH, FACIAL/TONGUE/THROAT SWELLING, SOB, OR LIGHTHEADEDNESS WITH HYPOTENSION:  #  #  YES  #  #  Has patient had a PCN reaction causing severe rash involving mucus membranes or skin necrosis: no Has patient had a PCN reaction that required hospitalization: no   . Methotrexate Derivatives Other (See Comments)    Hair loss  . Neurontin [Gabapentin] Nausea And Vomiting       PHYSICAL EXAMINATION: Vital signs: BP 108/64 (BP Location: Left Arm, Patient Position: Sitting, Cuff Size: Normal)   Pulse 80   Temp 98.5 F (36.9 C)   Ht 4' 8.3" (1.43 m)   Wt 104 lb (47.2 kg)    BMI 23.07 kg/m   Constitutional: Thin frail female, no acute distress Psychiatric: alert and oriented x3, cooperative Eyes: extraocular movements intact, anicteric, conjunctiva pink Mouth: oral pharynx moist, no lesions Neck: supple no lymphadenopathy Cardiovascular: heart regular rate and rhythm, no murmur Lungs: clear to auscultation bilaterally Abdomen: soft, nontender, nondistended, no obvious ascites, no peritoneal signs, normal bowel sounds, no organomegaly Rectal: Omitted Extremities: Deforming arthritis.  No clubbing, cyanosis, or lower extremity edema bilaterally Skin: no lesions on visible extremities Neuro: No focal deficits.  Cranial nerves intact  ASSESSMENT:  1.  Microscopic colitis.  Currently on budesonide 3 mg daily.  Improving 2.  GERD with peptic stricture requiring dilation December 2020.  Asymptomatic on omeprazole   PLAN:  1.  Continue to taper budesonide.  3 mg daily for an additional month then 3 mg every other day for 2 months.  Follow-up in the office in 3 months 2.  Continue omeprazole daily.  Contact the office for recurrent dysphagia 3.  Office follow-up 3 months A total time of 30 minutes was spent preparing to see the patient, reviewing laboratories, obtaining history, performing medically appropriate physical exam, counseling the patient and her husband regarding her above listed conditions, directing medical therapy, arranging follow-up, and documenting clinical information in the clinical health record

## 2019-12-22 ENCOUNTER — Telehealth: Payer: Self-pay | Admitting: Internal Medicine

## 2019-12-22 NOTE — Telephone Encounter (Signed)
Called and spoke to patient.   Per Dr. Blanch Media note from 12-04-19 OV: Continue to taper budesonide.  3 mg daily for an additional month then 3 mg every other day for 2 months.   Patient was unaware and had been taking twice a day. She will adjust her dosing to once a day and then go to every over day in a month. She thinks she has enough to complete taper but will let us know if she does not.

## 2019-12-22 NOTE — Telephone Encounter (Signed)
Pt states that she needs prescription for budisone for 1 BID. The one that was sent to [facility] was for once a day but pt states that she takes two.

## 2019-12-25 DIAGNOSIS — M0579 Rheumatoid arthritis with rheumatoid factor of multiple sites without organ or systems involvement: Secondary | ICD-10-CM | POA: Diagnosis not present

## 2019-12-25 DIAGNOSIS — M5136 Other intervertebral disc degeneration, lumbar region: Secondary | ICD-10-CM | POA: Diagnosis not present

## 2019-12-25 DIAGNOSIS — Z6820 Body mass index (BMI) 20.0-20.9, adult: Secondary | ICD-10-CM | POA: Diagnosis not present

## 2019-12-25 DIAGNOSIS — M150 Primary generalized (osteo)arthritis: Secondary | ICD-10-CM | POA: Diagnosis not present

## 2019-12-25 DIAGNOSIS — M7022 Olecranon bursitis, left elbow: Secondary | ICD-10-CM | POA: Diagnosis not present

## 2019-12-25 DIAGNOSIS — M21241 Flexion deformity, right finger joints: Secondary | ICD-10-CM | POA: Diagnosis not present

## 2019-12-25 DIAGNOSIS — M810 Age-related osteoporosis without current pathological fracture: Secondary | ICD-10-CM | POA: Diagnosis not present

## 2019-12-29 ENCOUNTER — Other Ambulatory Visit: Payer: Self-pay | Admitting: Internal Medicine

## 2019-12-29 DIAGNOSIS — I10 Essential (primary) hypertension: Secondary | ICD-10-CM

## 2019-12-29 NOTE — Telephone Encounter (Signed)
rx sent to pharmacy by e-script  

## 2019-12-30 DIAGNOSIS — M545 Low back pain: Secondary | ICD-10-CM | POA: Diagnosis not present

## 2020-01-05 ENCOUNTER — Other Ambulatory Visit: Payer: Self-pay | Admitting: Internal Medicine

## 2020-01-06 NOTE — Telephone Encounter (Signed)
rx sent to pharmacy by e-script  

## 2020-01-14 ENCOUNTER — Ambulatory Visit
Admission: RE | Admit: 2020-01-14 | Discharge: 2020-01-14 | Disposition: A | Discharge status: Home / Self Care | Payer: Medicare PPO | Source: Ambulatory Visit | Attending: Internal Medicine | Admitting: Internal Medicine

## 2020-01-14 ENCOUNTER — Other Ambulatory Visit: Payer: Self-pay

## 2020-01-14 DIAGNOSIS — M810 Age-related osteoporosis without current pathological fracture: Secondary | ICD-10-CM

## 2020-01-14 DIAGNOSIS — M85832 Other specified disorders of bone density and structure, left forearm: Secondary | ICD-10-CM | POA: Diagnosis not present

## 2020-01-14 DIAGNOSIS — Z780 Asymptomatic menopausal state: Secondary | ICD-10-CM | POA: Diagnosis not present

## 2020-01-14 NOTE — Progress Notes (Signed)
I recommend she continue prolia which has made just a slight improvement in her bone density since last check.  I believe Dr. Melissa Noon office handles her prolia.

## 2020-01-26 DIAGNOSIS — M0579 Rheumatoid arthritis with rheumatoid factor of multiple sites without organ or systems involvement: Secondary | ICD-10-CM | POA: Diagnosis not present

## 2020-02-04 ENCOUNTER — Other Ambulatory Visit: Payer: Self-pay | Admitting: Internal Medicine

## 2020-02-04 DIAGNOSIS — Z1231 Encounter for screening mammogram for malignant neoplasm of breast: Secondary | ICD-10-CM

## 2020-02-20 ENCOUNTER — Other Ambulatory Visit: Payer: Self-pay | Admitting: Internal Medicine

## 2020-02-23 DIAGNOSIS — M25571 Pain in right ankle and joints of right foot: Secondary | ICD-10-CM | POA: Diagnosis not present

## 2020-02-23 DIAGNOSIS — Z6821 Body mass index (BMI) 21.0-21.9, adult: Secondary | ICD-10-CM | POA: Diagnosis not present

## 2020-02-23 DIAGNOSIS — L02415 Cutaneous abscess of right lower limb: Secondary | ICD-10-CM | POA: Diagnosis not present

## 2020-02-23 DIAGNOSIS — L03115 Cellulitis of right lower limb: Secondary | ICD-10-CM | POA: Diagnosis not present

## 2020-02-26 DIAGNOSIS — L02415 Cutaneous abscess of right lower limb: Secondary | ICD-10-CM | POA: Diagnosis not present

## 2020-02-26 DIAGNOSIS — Z6821 Body mass index (BMI) 21.0-21.9, adult: Secondary | ICD-10-CM | POA: Diagnosis not present

## 2020-02-26 DIAGNOSIS — L03115 Cellulitis of right lower limb: Secondary | ICD-10-CM | POA: Diagnosis not present

## 2020-03-01 DIAGNOSIS — Z6820 Body mass index (BMI) 20.0-20.9, adult: Secondary | ICD-10-CM | POA: Diagnosis not present

## 2020-03-01 DIAGNOSIS — M150 Primary generalized (osteo)arthritis: Secondary | ICD-10-CM | POA: Diagnosis not present

## 2020-03-01 DIAGNOSIS — M810 Age-related osteoporosis without current pathological fracture: Secondary | ICD-10-CM | POA: Diagnosis not present

## 2020-03-01 DIAGNOSIS — M5136 Other intervertebral disc degeneration, lumbar region: Secondary | ICD-10-CM | POA: Diagnosis not present

## 2020-03-01 DIAGNOSIS — M21241 Flexion deformity, right finger joints: Secondary | ICD-10-CM | POA: Diagnosis not present

## 2020-03-01 DIAGNOSIS — M0579 Rheumatoid arthritis with rheumatoid factor of multiple sites without organ or systems involvement: Secondary | ICD-10-CM | POA: Diagnosis not present

## 2020-03-01 DIAGNOSIS — M7022 Olecranon bursitis, left elbow: Secondary | ICD-10-CM | POA: Diagnosis not present

## 2020-03-04 ENCOUNTER — Ambulatory Visit: Payer: Medicare PPO | Admitting: Internal Medicine

## 2020-03-04 ENCOUNTER — Encounter: Payer: Self-pay | Admitting: Internal Medicine

## 2020-03-04 VITALS — BP 124/70 | HR 76 | Ht <= 58 in | Wt 102.0 lb

## 2020-03-04 DIAGNOSIS — K52839 Microscopic colitis, unspecified: Secondary | ICD-10-CM | POA: Diagnosis not present

## 2020-03-04 DIAGNOSIS — K219 Gastro-esophageal reflux disease without esophagitis: Secondary | ICD-10-CM | POA: Diagnosis not present

## 2020-03-04 DIAGNOSIS — R197 Diarrhea, unspecified: Secondary | ICD-10-CM

## 2020-03-04 MED ORDER — BUDESONIDE 3 MG PO CPEP: 3.0000 mg | ORAL_CAPSULE | Freq: Every day | ORAL | 11 refills | Status: DC

## 2020-03-04 NOTE — Progress Notes (Signed)
HISTORY OF PRESENT ILLNESS:  Joan [name] is a 74 y.o. female with multiple medical problems who presents today for follow-up regarding management of her microscopic colitis diagnosed December 2020.  Last seen in the office December 04, 2019.  She is accompanied by her husband.  At the time of her last visit she was on budesonide 3 mg daily.  Over the past 3 weeks she has been on budesonide 3 mg every other day.  She is pleased to report that her diarrhea has markedly improved.  She describes 2-3, generally formed, bowel movements per day.  Occasional loose stool.  No issues with urgency.  No appreciable medication side effect  REVIEW OF SYSTEMS:  All non-GI ROS negative unless otherwise stated in the HPI except for arthritis, fatigue  Past Medical History:  Diagnosis Date  . Alopecia areata 02/16/2006  . Anxiety state, unspecified 2004  . Benign paroxysmal positional vertigo 04/01/03  . Cancer ([place])    skin   . Cervicalgia 04/01/03  . Chronic rhinitis 09/01/05  . Corns and callosities   . Dermatophytosis of nail   . Diarrhea 04/15/2015  . Diverticulosis   . Dysphagia, unspecified(787.20) 2004  . Enthesopathy of ankle and tarsus, unspecified 03/31/96  . External hemorrhoids without mention of complication   . 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 Fibromyalgia   . Hypertension 2004  . Hypothyroidism   . Impacted cerumen    right ear  . Lumbago   . Myalgia and myositis, unspecified 03/01/05  . Nontoxic uninodular goiter   . Osteoarthrosis, unspecified whether generalized or localized, unspecified site 04/01/03  . Other left bundle branch block   . Rheumatoid arthritis(714.0) 10/05/2004   lumbar spondylosis. Lumbo-sacral anterolisthesis    . Senile osteoporosis 04/01/03  . Sprain of metatarsophalangeal (joint) of foot   . Thyrotoxicosis   . Thyrotoxicosis without mention of goiter or other cause, without mention of thyrotoxic crisis or storm   . Unspecified vitamin D deficiency   . Urinary  frequency     Past Surgical History:  Procedure Laterality Date  . COLONOSCOPY    . COLONOSCOPY N/A 04/15/2015   Procedure: COLONOSCOPY;  Surgeon: Gatha Mayer, MD;  Location: [facility];  Service: Endoscopy;  Laterality: N/A;  . DG FINGERS, MULTIPLE LT HAND ([place] HX)    . FOOT SURGERY Bilateral 05/22/2014   Dr.Strom   . LUMBAR DISC SURGERY  12/12/2017   Dr Arnoldo Morale  . [place]   DR [place]   . TONSILLECTOMY AND ADENOIDECTOMY  1974  . TUBAL LIGATION  1973    Social History Gladis A Hoskinson  reports that she has never smoked. She has never used smokeless tobacco. She reports that she does not drink alcohol and does not use drugs.  family history includes Heart attack in her father; Heart disease in her father.  Allergies  Allergen Reactions  . Arava [Leflunomide] Diarrhea, Nausea Only and Other (See Comments)    Loss appetite  . Penicillins Rash and Other (See Comments)    PATIENT HAS HAD A PCN REACTION WITH IMMEDIATE RASH, FACIAL/TONGUE/THROAT SWELLING, SOB, OR LIGHTHEADEDNESS WITH HYPOTENSION:  #  #  YES  #  #  Has patient had a PCN reaction causing severe rash involving mucus membranes or skin necrosis: no Has patient had a PCN reaction that required hospitalization: no   . Methotrexate Derivatives Other (See Comments)    Hair  loss  . Neurontin [Gabapentin] Nausea And Vomiting       PHYSICAL EXAMINATION: Vital signs: BP 124/70 (BP Location: Left Arm, Patient Position: Sitting, Cuff Size: Normal)   Pulse 76   Ht 4' 8.3" (1.43 m)   Wt 102 lb (46.3 kg)   BMI 22.62 kg/m   Constitutional: generally well-appearing, no acute distress Psychiatric: alert and oriented x3, cooperative Eyes: Anicteric Abdomen.  Not reexamined  ASSESSMENT:  1.  Microscopic colitis diagnosed December 2020.  Improved on budesonide 2.  Chronic GERD.  On PPI 3.  Multiple medical problems   PLAN:  1.  Continue  budesonide 3 mg every other day for an additional month then stop 2.  Contact the office should she experience significant recurrent diarrhea 3.  Reflux precautions 4.  Continue PPI 5.  GI office follow-up 3 months

## 2020-03-04 NOTE — Patient Instructions (Signed)
We have sent the following medications to your pharmacy for you to pick up at your convenience:  Budesonide  Please call us in September to make follow up appointment for October.

## 2020-03-09 DIAGNOSIS — L02415 Cutaneous abscess of right lower limb: Secondary | ICD-10-CM | POA: Diagnosis not present

## 2020-03-09 DIAGNOSIS — H6120 Impacted cerumen, unspecified ear: Secondary | ICD-10-CM | POA: Diagnosis not present

## 2020-03-09 DIAGNOSIS — Z6820 Body mass index (BMI) 20.0-20.9, adult: Secondary | ICD-10-CM | POA: Diagnosis not present

## 2020-03-09 DIAGNOSIS — L03115 Cellulitis of right lower limb: Secondary | ICD-10-CM | POA: Diagnosis not present

## 2020-03-11 ENCOUNTER — Telehealth: Payer: Self-pay

## 2020-03-11 DIAGNOSIS — E039 Hypothyroidism, unspecified: Secondary | ICD-10-CM

## 2020-03-11 DIAGNOSIS — R5383 Other fatigue: Secondary | ICD-10-CM

## 2020-03-11 NOTE — Telephone Encounter (Signed)
Patient called to inquire about what labs Dr.Reed will check at her next appointment. I explained pending labs orders.  Patient states she is extremely fatigued and would like to make sure no additional labs need to be checked to evaluate her symptom.   Patient states no need to call back if Dr.Reed thinks more labs is need to be added to add orders and if not no action required.   Next appointment with Dr.Reed 03/29/2020 @ 8:30 am

## 2020-03-11 NOTE — Telephone Encounter (Signed)
Let's add a TSH to her labs due to fatigue.  The anemia labs will also be helpful that were already ordered.

## 2020-03-12 NOTE — Telephone Encounter (Signed)
Future order added for TSH.   Left detailed message on voicemail for patient with Dr.Reed's plan of care, patient instructed to return call if she has questions or concerns

## 2020-03-18 DIAGNOSIS — L02415 Cutaneous abscess of right lower limb: Secondary | ICD-10-CM | POA: Diagnosis not present

## 2020-03-18 DIAGNOSIS — Z6821 Body mass index (BMI) 21.0-21.9, adult: Secondary | ICD-10-CM | POA: Diagnosis not present

## 2020-03-18 DIAGNOSIS — L03115 Cellulitis of right lower limb: Secondary | ICD-10-CM | POA: Diagnosis not present

## 2020-03-22 ENCOUNTER — Other Ambulatory Visit: Payer: Medicare PPO

## 2020-03-22 ENCOUNTER — Ambulatory Visit
Admission: RE | Admit: 2020-03-22 | Discharge: 2020-03-22 | Disposition: A | Discharge status: Home / Self Care | Payer: Medicare PPO | Source: Ambulatory Visit | Attending: Internal Medicine | Admitting: Internal Medicine

## 2020-03-22 ENCOUNTER — Other Ambulatory Visit: Payer: Self-pay

## 2020-03-22 DIAGNOSIS — E039 Hypothyroidism, unspecified: Secondary | ICD-10-CM

## 2020-03-22 DIAGNOSIS — R5383 Other fatigue: Secondary | ICD-10-CM

## 2020-03-22 DIAGNOSIS — D509 Iron deficiency anemia, unspecified: Secondary | ICD-10-CM | POA: Diagnosis not present

## 2020-03-22 DIAGNOSIS — D649 Anemia, unspecified: Secondary | ICD-10-CM

## 2020-03-22 DIAGNOSIS — Z1231 Encounter for screening mammogram for malignant neoplasm of breast: Secondary | ICD-10-CM

## 2020-03-22 DIAGNOSIS — E538 Deficiency of other specified B group vitamins: Secondary | ICD-10-CM | POA: Diagnosis not present

## 2020-03-22 LAB — BASIC METABOLIC PANEL
BUN/Creatinine Ratio: 34 (calc) — ABNORMAL HIGH (ref 6–22)
BUN: 28 mg/dL — ABNORMAL HIGH (ref 7–25)
CO2: 31 mmol/L (ref 20–32)
Calcium: 8.6 mg/dL (ref 8.6–10.4)
Chloride: 103 mmol/L (ref 98–110)
Creat: 0.82 mg/dL (ref 0.60–0.93)
Glucose, Bld: 68 mg/dL (ref 65–99)
Potassium: 4.2 mmol/L (ref 3.5–5.3)
Sodium: 140 mmol/L (ref 135–146)

## 2020-03-22 LAB — CBC WITH DIFFERENTIAL/PLATELET
Absolute Monocytes: 1431 cells/uL — ABNORMAL HIGH (ref 200–950)
Basophils Absolute: 54 cells/uL (ref 0–200)
Basophils Relative: 0.6 %
Eosinophils Absolute: 144 cells/uL (ref 15–500)
Eosinophils Relative: 1.6 %
HCT: 36 % (ref 35.0–45.0)
Hemoglobin: 11.4 g/dL — ABNORMAL LOW (ref 11.7–15.5)
Lymphs Abs: 3447 cells/uL (ref 850–3900)
MCH: 27.7 pg (ref 27.0–33.0)
MCHC: 31.7 g/dL — ABNORMAL LOW (ref 32.0–36.0)
MCV: 87.4 fL (ref 80.0–100.0)
MPV: 10.7 fL (ref 7.5–12.5)
Monocytes Relative: 15.9 %
Neutro Abs: 3924 cells/uL (ref 1500–7800)
Neutrophils Relative %: 43.6 %
Platelets: 293 10*3/uL (ref 140–400)
RBC: 4.12 10*6/uL (ref 3.80–5.10)
RDW: 13.6 % (ref 11.0–15.0)
Total Lymphocyte: 38.3 %
WBC: 9 10*3/uL (ref 3.8–10.8)

## 2020-03-22 LAB — TSH: TSH: 5.82 mIU/L — ABNORMAL HIGH (ref 0.40–4.50)

## 2020-03-22 LAB — IRON,TIBC AND FERRITIN PANEL
%SAT: 9 % (calc) — ABNORMAL LOW (ref 16–45)
Ferritin: 10 ng/mL — ABNORMAL LOW (ref 16–288)
Iron: 31 ug/dL — ABNORMAL LOW (ref 45–160)
TIBC: 357 mcg/dL (calc) (ref 250–450)

## 2020-03-22 LAB — VITAMIN B12: Vitamin B-12: >2000 pg/mL — ABNORMAL HIGH (ref 200–1100)

## 2020-03-23 NOTE — Progress Notes (Signed)
Normal mammogram

## 2020-03-23 NOTE — Progress Notes (Signed)
TSH was high suggesting inadequate thyroid hormone repletion.  I believe her endocrinologist typically adjusts this. Electrolytes and kidneys were ok though it looks like she should drink more water based on her BUN. Her anemia has improved a little since march.  Iron deficiency is present.  We can discuss management of this at her visit. She's getting plenty of B12.

## 2020-03-24 NOTE — Progress Notes (Signed)
The prenatal vitamin will help supplement her iron so that is an option with less GI side effects than a typical iron supplement, but not as much iron.  It's ok to continue the b12.

## 2020-03-26 ENCOUNTER — Other Ambulatory Visit: Payer: Self-pay | Admitting: *Deleted

## 2020-03-26 DIAGNOSIS — I10 Essential (primary) hypertension: Secondary | ICD-10-CM

## 2020-03-26 MED ORDER — LOSARTAN POTASSIUM 100 MG PO TABS: 100.0000 mg | ORAL_TABLET | Freq: Every day | ORAL | 0 refills | Status: DC

## 2020-03-26 NOTE — Telephone Encounter (Signed)
[facility] [place]

## 2020-03-29 ENCOUNTER — Other Ambulatory Visit: Payer: Self-pay | Admitting: Internal Medicine

## 2020-03-29 ENCOUNTER — Ambulatory Visit (INDEPENDENT_AMBULATORY_CARE_PROVIDER_SITE_OTHER): Payer: Medicare PPO | Admitting: Internal Medicine

## 2020-03-29 ENCOUNTER — Encounter: Payer: Self-pay | Admitting: Internal Medicine

## 2020-03-29 ENCOUNTER — Other Ambulatory Visit: Payer: Self-pay

## 2020-03-29 VITALS — BP 110/62 | HR 74 | Temp 97.5°F | Ht <= 58 in | Wt 100.0 lb

## 2020-03-29 DIAGNOSIS — E039 Hypothyroidism, unspecified: Secondary | ICD-10-CM | POA: Diagnosis not present

## 2020-03-29 DIAGNOSIS — R5383 Other fatigue: Secondary | ICD-10-CM

## 2020-03-29 DIAGNOSIS — K52838 Other microscopic colitis: Secondary | ICD-10-CM

## 2020-03-29 DIAGNOSIS — Z1159 Encounter for screening for other viral diseases: Secondary | ICD-10-CM

## 2020-03-29 DIAGNOSIS — M810 Age-related osteoporosis without current pathological fracture: Secondary | ICD-10-CM | POA: Diagnosis not present

## 2020-03-29 DIAGNOSIS — M059 Rheumatoid arthritis with rheumatoid factor, unspecified: Secondary | ICD-10-CM

## 2020-03-29 MED ORDER — COLESTIPOL HCL 1 G PO TABS: 2.0000 g | ORAL_TABLET | Freq: Two times a day (BID) | ORAL | 3 refills | Status: DC | PRN

## 2020-03-29 NOTE — Progress Notes (Signed)
Location:  [facility] clinic Provider:  Mayola Mcbain L. Mariea Clonts, D.O., C.M.D.  Code Status: full code Goals of Care:  Advanced Directives 03/29/2020  Does Patient Have a Medical Advance Directive? Yes  Type of Advance Directive -  Copy of [place] in Chart? -  Would patient like information on creating a medical advance directive? -   Chief Complaint  Patient presents with  . Medical Management of Chronic Issues    4 month follow up     HPI: Patient is a 74 y.o. female seen today for medical management of chronic diseases.    We sent her high TSH to Dr. Altheimer--was 5.82.  Says she does not have any energy.  She has brought her vitamins and asks about prenatal vs MVI for senior.   We decided on prenatal due to microcytic anemia.  Ok to take either B12 or B complex not both.  Level certainly adequate.    She had hurt her right leg and went to urgent care in [place].It's almost better.    Having more swelling of her left elbow recently.  Previously had aspirated but very little fluid came out.  Now asks about voltaren and ice.  Has RA on enbrel.    She had normal mammogram 03/22/20.    She is on prolia for osteoporosis thru Dr. Amil Amen not [place].  Bone density fairly stable in osteoporotic range with T score -3.  Also on her D3 and MVI.   Past Medical History:  Diagnosis Date  . Alopecia areata 02/16/2006  . Anxiety state, unspecified 2004  . Benign paroxysmal positional vertigo 04/01/03  . Cancer ([place])    skin   . Cervicalgia 04/01/03  . Chronic rhinitis 09/01/05  . Corns and callosities   . Dermatophytosis of nail   . Diarrhea 04/15/2015  . Diverticulosis   . Dysphagia, unspecified(787.20) 2004  . Enthesopathy of ankle and tarsus, unspecified 03/31/96  . External hemorrhoids without mention of complication   . 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 Fibromyalgia   . Hypertension 2004  . Hypothyroidism   . Impacted cerumen    right ear  . Lumbago   . Myalgia and  myositis, unspecified 03/01/05  . Nontoxic uninodular goiter   . Osteoarthrosis, unspecified whether generalized or localized, unspecified site 04/01/03  . Other left bundle branch block   . Rheumatoid arthritis(714.0) 10/05/2004   lumbar spondylosis. Lumbo-sacral anterolisthesis    . Senile osteoporosis 04/01/03  . Sprain of metatarsophalangeal (joint) of foot   . Thyrotoxicosis   . Thyrotoxicosis without mention of goiter or other cause, without mention of thyrotoxic crisis or storm   . Unspecified vitamin D deficiency   . Urinary frequency     Past Surgical History:  Procedure Laterality Date  . COLONOSCOPY    . COLONOSCOPY N/A 04/15/2015   Procedure: COLONOSCOPY;  Surgeon: Gatha Mayer, MD;  Location: [facility];  Service: Endoscopy;  Laterality: N/A;  . DG FINGERS, MULTIPLE LT HAND ([place] HX)    . FOOT SURGERY Bilateral 05/22/2014   Dr.Strom   . LUMBAR DISC SURGERY  12/12/2017   Dr Arnoldo Morale  . [place]   DR [place]   . TONSILLECTOMY AND ADENOIDECTOMY  1974  . TUBAL LIGATION  1973    Allergies  Allergen Reactions  . Arava [Leflunomide] Diarrhea, Nausea Only and Other (See Comments)    Loss appetite  .  Penicillins Rash and Other (See Comments)    PATIENT HAS HAD A PCN REACTION WITH IMMEDIATE RASH, FACIAL/TONGUE/THROAT SWELLING, SOB, OR LIGHTHEADEDNESS WITH HYPOTENSION:  #  #  YES  #  #  Has patient had a PCN reaction causing severe rash involving mucus membranes or skin necrosis: no Has patient had a PCN reaction that required hospitalization: no   . Methotrexate Derivatives Other (See Comments)    Hair loss  . Neurontin [Gabapentin] Nausea And Vomiting    Outpatient Encounter Medications as of 03/29/2020  Medication Sig  . acetaminophen (TYLENOL) 500 MG tablet Take 1,000 mg by mouth as needed for mild pain or moderate pain.   Marland Kitchen amitriptyline (ELAVIL) 10 MG tablet TAKE 1 TABLET BY MOUTH AT BEDTIME TO   HELP  RELAX  MUSCLES  . Biotin 1000 MCG tablet Take 1 tablet (1 mg total) by mouth 2 (two) times daily.  . budesonide (ENTOCORT EC) 3 MG 24 hr capsule Take 1 capsule (3 mg total) by mouth daily.  . Calcium Carb-Cholecalciferol (CALTRATE 600+D3) 600-800 MG-UNIT TABS Take 1 tablet by mouth 2 (two) times daily.  . Cholecalciferol (VITAMIN D3) 50 MCG (2000 UT) capsule Take 1 capsule (2,000 Units total) by mouth daily.  . colestipol (COLESTID) 1 g tablet Take 2 tablets (2 g total) by mouth 2 (two) times daily. (Patient taking differently: Take 2 g by mouth as needed. )  . cycloSPORINE (RESTASIS) 0.05 % ophthalmic emulsion Place 1 drop into both eyes 2 (two) times daily.  Marland Kitchen denosumab (PROLIA) 60 MG/ML SOSY injection Inject 60 mg into the skin every 6 (six) months.  . diphenoxylate-atropine (LOMOTIL) 2.5-0.025 MG tablet TAKE 1 TO 2 TABLETS BY MOUTH EVERY 8 HOURS AS NEEDED  . etanercept (ENBREL) 50 MG/ML injection Inject 50 mg into the skin once a week.  . fluticasone (FLONASE) 50 MCG/ACT nasal spray Place 1 spray into both nostrils daily for 14 days.  . folic acid (FOLVITE) 1 MG tablet Take 1 mg by mouth daily.  . Glucosamine-Chondroit-Vit C-Mn (GLUCOSAMINE CHONDR 500 COMPLEX PO) Take 2 tablets by mouth daily.   . hydrochlorothiazide (HYDRODIURIL) 25 MG tablet Take 1 tablet by mouth once daily  . losartan (COZAAR) 100 MG tablet Take 1 tablet (100 mg total) by mouth daily.  . Menthol, Topical Analgesic, (ASPERCREME MAX ROLL-ON EX) Apply topically.  . Multiple Vitamin (MULTIVITAMIN) tablet Take 1 tablet by mouth daily.   . naproxen (NAPROSYN) 500 MG tablet Take 1 tablet by mouth in the morning and at bedtime.  . Omega-3 Fatty Acids (FISH OIL) 1000 MG CAPS Take 1,000 mg by mouth daily.   Marland Kitchen omeprazole (PRILOSEC) 40 MG capsule Take 1 capsule (40 mg total) by mouth daily.  . predniSONE (DELTASONE) 5 MG tablet TAKE 1 TABLET BY MOUTH WITH BREAKFAST  . Propylene Glycol (SYSTANE BALANCE) 0.6 % SOLN Place 1 drop  into both eyes 2 (two) times daily as needed (dry eyes).   . traMADol (ULTRAM) 50 MG tablet Take by mouth 2 (two) times daily as needed.  . urea (CARMOL) 10 % cream Apply topically as needed.   No facility-administered encounter medications on file as of 03/29/2020.    Review of Systems:  Review of Systems  Constitutional: Positive for malaise/fatigue. Negative for chills and fever.  HENT: Negative for congestion and sore throat.   Eyes: Negative for blurred vision.  Respiratory: Negative for cough and shortness of breath.   Cardiovascular: Negative for chest pain, palpitations and leg swelling.  Gastrointestinal: Positive  for diarrhea. Negative for abdominal pain, blood in stool, constipation, heartburn and melena.  Genitourinary: Negative for dysuria.  Musculoskeletal: Positive for joint pain. Negative for falls.  Skin: Negative for itching and rash.       Right leg laceration with bandaid in place  Neurological: Negative for dizziness and loss of consciousness.  Endo/Heme/Allergies: Bruises/bleeds easily.  Psychiatric/Behavioral: Negative for depression and memory loss. The patient is not nervous/anxious and does not have insomnia.     Health Maintenance  Topic Date Due  . Hepatitis C Screening  Never done  . TETANUS/TDAP  09/05/2015  . INFLUENZA VACCINE  04/04/2020  . MAMMOGRAM  03/22/2022  . COLONOSCOPY  08/26/2029  . DEXA SCAN  Completed  . COVID-19 Vaccine  Completed  . PNA vac Low Risk Adult  Completed    Physical Exam: Vitals:   03/29/20 0829  BP: (!) 162/62  Pulse: 74  Temp: (!) 97.5 F (36.4 C)  TempSrc: Temporal  SpO2: 96%  Weight: 100 lb (45.4 kg)   Body mass index is 22.18 kg/m. Physical Exam Vitals reviewed.  Constitutional:      General: She is not in acute distress.    Appearance: Normal appearance. She is not toxic-appearing.  Cardiovascular:     Rate and Rhythm: Normal rate and regular rhythm.     Pulses: Normal pulses.     Heart sounds:  Normal heart sounds.  Pulmonary:     Effort: Pulmonary effort is normal.     Breath sounds: Normal breath sounds. No wheezing, rhonchi or rales.  Abdominal:     General: Bowel sounds are normal.  Musculoskeletal:        General: Swelling and tenderness present. Normal range of motion.     Cervical back: Neck supple.     Comments: Left elbow with erythema, warmth, swelling; also has deformities of joints of hands and swelling  Skin:    General: Skin is warm and dry.     Comments: Small abrasion lower right lateral leg with bandaid used as dressing, no surrounding erythema or warmth  Neurological:     General: No focal deficit present.     Mental Status: She is alert and oriented to person, place, and time.  Psychiatric:        Mood and Affect: Mood normal.        Behavior: Behavior normal.        Thought Content: Thought content normal.        Judgment: Judgment normal.     Labs reviewed: Basic Metabolic Panel: Recent Labs    05/22/19 0959 11/17/19 0859 03/22/20 0816  NA 137 140 140  K 4.3 3.9 4.2  CL 103 102 103  CO2 28 31 31   GLUCOSE 96 82 68  BUN 25 26* 28*  CREATININE 0.90 0.85 0.82  CALCIUM 8.5* 8.5* 8.6  TSH  --   --  5.82*   Liver Function Tests: Recent Labs    05/14/19 0914 11/17/19 0859  AST 11 12  ALT 9 9  BILITOT 0.3 0.3  PROT 5.8* 5.7*   No results for input(s): LIPASE, AMYLASE in the last 8760 hours. No results for input(s): AMMONIA in the last 8760 hours. CBC: Recent Labs    05/14/19 0914 11/17/19 0859 03/22/20 0816  WBC 9.0 9.0 9.0  NEUTROABS 4,158 3,987 3,924  HGB 11.6* 10.4* 11.4*  HCT 35.5 32.5* 36.0  MCV 89.0 87.4 87.4  PLT 311 310 293   Lipid Panel: Recent Labs  05/14/19 0914 11/17/19 0859  CHOL 143 172  HDL 53 71  LDLCALC 72 78  TRIG 95 134  CHOLHDL 2.7 2.4   Lab Results  Component Value Date   HGBA1C 5.2 11/17/2019    Procedures since last visit: MM 3D SCREEN BREAST BILATERAL  Result Date: 03/23/2020 CLINICAL  DATA:  Screening. EXAM: DIGITAL SCREENING BILATERAL MAMMOGRAM WITH TOMO AND CAD COMPARISON:  Previous exam(s). ACR Breast Density Category b: There are scattered areas of fibroglandular density. FINDINGS: There are no findings suspicious for malignancy. Images were processed with CAD. IMPRESSION: No mammographic evidence of malignancy. A result letter of this screening mammogram will be mailed directly to the patient. RECOMMENDATION: Screening mammogram in one year. (Code:SM-B-01Y) BI-RADS CATEGORY  1: Negative. Electronically Signed   By: Marin Olp M.D.   On: 03/23/2020 13:35    Assessment/Plan 1. Fatigue, unspecified type - begin prenatal vitamin in place of regular mvi to avoid gi distress associated with iron supplements - Iron, TIBC and Ferritin Panel; Future - Basic metabolic panel; Future - CBC with Differential/Platelet; Future - Iron, TIBC and Ferritin Panel; Future  2. Hypothyroidism, unspecified type -tsh mildly high, results sent to Dr. Elyse Hsu, not currently on levothyroxine--reports she had diarrhea from it before (? Was too much then or simply b/c of her colitis)  3. Rheumatoid arthritis with positive rheumatoid factor, involving unspecified site (HCC) -cont embrel, folic acid, prednisone, f/u with rheum, Dr. Amil Amen -ok to use voltaren gel and ice to left elbow  4. Other microscopic colitis -followed by GI--Dr. Henrene Pastor, continues to have diarrhea at times -continues on budesonide 3mg  daily, colestipol bid prn, lomotil 1-2 q8h prn   5. Senile osteoporosis -cont prolia and vitamin D3 therapy, just had bone density that was fairly stable in osteoporotic range with T score -3  6. Encounter for hepatitis C screening test for low risk patient - Hepatitis C antibody; Future  SHE IS TO CALL us BACK TO LET us KNOW IF SHE DID GET HER TDAP IN [place] AS SHE THINKS SHE DID.  Labs/tests ordered:   Lab Orders     Basic metabolic panel     CBC with Differential/Platelet      Iron, TIBC and Ferritin Panel     Hepatitis C antibody  Next appt:  08/02/2020    Ange Puskas L. Waylon Koffler, D.O. [facility] [address], [place] [address] Cell Phone (Mon-Fri 8am-5pm):  [phone] On Call:  [phone] & follow prompts after 5pm & weekends Office Phone:  [phone] Office Fax:  [phone]

## 2020-03-29 NOTE — Patient Instructions (Addendum)
Take b12 1051mcg daily and stop b complex.    Take the prenatal vitamin to supplement your iron without upsetting your stomach.  Ok to use ice and voltaren gel up to 4x on your left elbow until swelling resolves and the pain improves.    Let me know when your tdap or td booster was--you mentioned getting it at [facility] in [place].  It is every 10 years.   Cont prolia and D3.  Bone density was pretty stable.

## 2020-03-30 NOTE — Telephone Encounter (Signed)
Pharmacy requested refill °Pended Rx and sent to Dr. Reed for approval due to HIGH ALERT Warning.  °

## 2020-04-06 DIAGNOSIS — E890 Postprocedural hypothyroidism: Secondary | ICD-10-CM | POA: Diagnosis not present

## 2020-04-06 DIAGNOSIS — E049 Nontoxic goiter, unspecified: Secondary | ICD-10-CM | POA: Diagnosis not present

## 2020-04-09 DIAGNOSIS — E049 Nontoxic goiter, unspecified: Secondary | ICD-10-CM | POA: Diagnosis not present

## 2020-04-09 DIAGNOSIS — E890 Postprocedural hypothyroidism: Secondary | ICD-10-CM | POA: Diagnosis not present

## 2020-04-09 DIAGNOSIS — E041 Nontoxic single thyroid nodule: Secondary | ICD-10-CM | POA: Diagnosis not present

## 2020-04-15 ENCOUNTER — Encounter: Payer: Self-pay | Admitting: Internal Medicine

## 2020-04-15 DIAGNOSIS — D509 Iron deficiency anemia, unspecified: Secondary | ICD-10-CM | POA: Insufficient documentation

## 2020-04-15 DIAGNOSIS — E538 Deficiency of other specified B group vitamins: Secondary | ICD-10-CM | POA: Insufficient documentation

## 2020-04-27 DIAGNOSIS — L03115 Cellulitis of right lower limb: Secondary | ICD-10-CM | POA: Diagnosis not present

## 2020-04-27 DIAGNOSIS — Z6821 Body mass index (BMI) 21.0-21.9, adult: Secondary | ICD-10-CM | POA: Diagnosis not present

## 2020-04-27 DIAGNOSIS — L02415 Cutaneous abscess of right lower limb: Secondary | ICD-10-CM | POA: Diagnosis not present

## 2020-05-07 ENCOUNTER — Other Ambulatory Visit: Payer: Self-pay | Admitting: Internal Medicine

## 2020-05-24 DIAGNOSIS — M5136 Other intervertebral disc degeneration, lumbar region: Secondary | ICD-10-CM | POA: Diagnosis not present

## 2020-05-24 DIAGNOSIS — M21241 Flexion deformity, right finger joints: Secondary | ICD-10-CM | POA: Diagnosis not present

## 2020-05-24 DIAGNOSIS — M810 Age-related osteoporosis without current pathological fracture: Secondary | ICD-10-CM | POA: Diagnosis not present

## 2020-05-24 DIAGNOSIS — M150 Primary generalized (osteo)arthritis: Secondary | ICD-10-CM | POA: Diagnosis not present

## 2020-05-24 DIAGNOSIS — M7022 Olecranon bursitis, left elbow: Secondary | ICD-10-CM | POA: Diagnosis not present

## 2020-05-24 DIAGNOSIS — Z681 Body mass index (BMI) 19 or less, adult: Secondary | ICD-10-CM | POA: Diagnosis not present

## 2020-05-24 DIAGNOSIS — M0579 Rheumatoid arthritis with rheumatoid factor of multiple sites without organ or systems involvement: Secondary | ICD-10-CM | POA: Diagnosis not present

## 2020-05-31 DIAGNOSIS — L02415 Cutaneous abscess of right lower limb: Secondary | ICD-10-CM | POA: Diagnosis not present

## 2020-05-31 DIAGNOSIS — L03115 Cellulitis of right lower limb: Secondary | ICD-10-CM | POA: Diagnosis not present

## 2020-05-31 DIAGNOSIS — Z6821 Body mass index (BMI) 21.0-21.9, adult: Secondary | ICD-10-CM | POA: Diagnosis not present

## 2020-06-01 DIAGNOSIS — L821 Other seborrheic keratosis: Secondary | ICD-10-CM | POA: Diagnosis not present

## 2020-06-01 DIAGNOSIS — L853 Xerosis cutis: Secondary | ICD-10-CM | POA: Diagnosis not present

## 2020-06-01 DIAGNOSIS — B079 Viral wart, unspecified: Secondary | ICD-10-CM | POA: Diagnosis not present

## 2020-07-05 DIAGNOSIS — L03115 Cellulitis of right lower limb: Secondary | ICD-10-CM | POA: Diagnosis not present

## 2020-07-05 DIAGNOSIS — H9193 Unspecified hearing loss, bilateral: Secondary | ICD-10-CM | POA: Diagnosis not present

## 2020-07-05 DIAGNOSIS — Z6820 Body mass index (BMI) 20.0-20.9, adult: Secondary | ICD-10-CM | POA: Diagnosis not present

## 2020-07-05 DIAGNOSIS — L02415 Cutaneous abscess of right lower limb: Secondary | ICD-10-CM | POA: Diagnosis not present

## 2020-07-06 ENCOUNTER — Encounter: Payer: Self-pay | Admitting: Internal Medicine

## 2020-07-06 ENCOUNTER — Ambulatory Visit: Payer: Medicare PPO | Admitting: Internal Medicine

## 2020-07-06 ENCOUNTER — Other Ambulatory Visit: Payer: Self-pay | Admitting: Internal Medicine

## 2020-07-06 VITALS — BP 120/60 | HR 77 | Ht <= 58 in | Wt 98.5 lb

## 2020-07-06 DIAGNOSIS — K52839 Microscopic colitis, unspecified: Secondary | ICD-10-CM | POA: Diagnosis not present

## 2020-07-06 DIAGNOSIS — R197 Diarrhea, unspecified: Secondary | ICD-10-CM

## 2020-07-06 DIAGNOSIS — I10 Essential (primary) hypertension: Secondary | ICD-10-CM

## 2020-07-06 MED ORDER — COLESTIPOL HCL 1 G PO TABS: 2.0000 g | ORAL_TABLET | Freq: Two times a day (BID) | ORAL | 6 refills | Status: DC | PRN

## 2020-07-06 MED ORDER — BUDESONIDE 3 MG PO CPEP: 9.0000 mg | ORAL_CAPSULE | Freq: Every day | ORAL | 2 refills | Status: DC

## 2020-07-06 MED ORDER — DIPHENOXYLATE-ATROPINE 2.5-0.025 MG PO TABS: ORAL_TABLET | ORAL | 6 refills | Status: DC

## 2020-07-06 NOTE — Progress Notes (Signed)
HISTORY OF PRESENT ILLNESS:  Joan [name] is a 74 y.o. female with microscopic colitis diagnosed December 2020.  She was treated with budesonide and improved.  Last seen in the office 03/04/2020.  At that time she was on 3 mg every other day.  2-3 formed bowel movements.  Stop the medication 04/04/2020.  Placed on thyroid replacement medication 04/09/2020.  Few weeks later developed recurrent problems with diarrhea.  This is continued.  Having 4-5 loose bowel movements per day.  No bleeding, pain, or other issues.  She is wondering if it is her thyroid medicine.  She informed her prescribing physician who did change the type of thyroid replacement.  She does tell me that she has had thyroid replacement in the past without diarrhea issues.  She is accompanied by her husband  REVIEW OF SYSTEMS:  All non-GI ROS negative except for arthritis  Past Medical History:  Diagnosis Date  . Alopecia areata 02/16/2006  . Anxiety state, unspecified 2004  . Benign paroxysmal positional vertigo 04/01/03  . Cancer ([place])    skin   . Cervicalgia 04/01/03  . Chronic rhinitis 09/01/05  . Corns and callosities   . Dermatophytosis of nail   . Diarrhea 04/15/2015  . Diverticulosis   . Dysphagia, unspecified(787.20) 2004  . Enthesopathy of ankle and tarsus, unspecified 03/31/96  . External hemorrhoids without mention of complication   . 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 Fibromyalgia   . Hypertension 2004  . Hypothyroidism   . Impacted cerumen    right ear  . Lumbago   . Myalgia and myositis, unspecified 03/01/05  . Nontoxic uninodular goiter   . Osteoarthrosis, unspecified whether generalized or localized, unspecified site 04/01/03  . Other left bundle branch block   . Rheumatoid arthritis(714.0) 10/05/2004   lumbar spondylosis. Lumbo-sacral anterolisthesis    . Senile osteoporosis 04/01/03  . Sprain of metatarsophalangeal (joint) of foot   . Thyrotoxicosis   . Thyrotoxicosis without mention of goiter or other  cause, without mention of thyrotoxic crisis or storm   . Unspecified vitamin D deficiency   . Urinary frequency     Past Surgical History:  Procedure Laterality Date  . COLONOSCOPY    . COLONOSCOPY N/A 04/15/2015   Procedure: COLONOSCOPY;  Surgeon: Gatha Mayer, MD;  Location: [facility];  Service: Endoscopy;  Laterality: N/A;  . DG FINGERS, MULTIPLE LT HAND ([place] HX)    . FOOT SURGERY Bilateral 05/22/2014   Dr.Strom   . LUMBAR DISC SURGERY  12/12/2017   Dr Arnoldo Morale  . [place]   DR [place]   . TONSILLECTOMY AND ADENOIDECTOMY  1974  . TUBAL LIGATION  1973    Social History Joan [name]  reports that she has never smoked. She has never used smokeless tobacco. She reports that she does not drink alcohol and does not use drugs.  family history includes Heart attack in her father; Heart disease in her father.  Allergies  Allergen Reactions  . Arava [Leflunomide] Diarrhea, Nausea Only and Other (See Comments)    Loss appetite  . Penicillins Rash and Other (See Comments)    PATIENT HAS HAD A PCN REACTION WITH IMMEDIATE RASH, FACIAL/TONGUE/THROAT SWELLING, SOB, OR LIGHTHEADEDNESS WITH HYPOTENSION:  #  #  YES  #  #  Has patient had a PCN reaction causing severe rash involving mucus membranes or skin necrosis: no Has patient had a PCN  reaction that required hospitalization: no   . Methotrexate Derivatives Other (See Comments)    Hair loss  . Neurontin [Gabapentin] Nausea And Vomiting       PHYSICAL EXAMINATION: Vital signs: BP 120/60   Pulse 77   Ht 4\' 8"  (1.422 m)   Wt 98 lb 8 oz (44.7 kg)   BMI 22.08 kg/m   Constitutional: generally well-appearing, no acute distress Psychiatric: alert and oriented x3, cooperative Eyes: extraocular movements intact, anicteric, conjunctiva pink Mouth: Mask Abdomen: Not reevaluated  Neuro: Alert and oriented  ASSESSMENT:  1.  Recurrent diarrhea.  Suspect  microscopic colitis after weaning off budesonide 2.  Recently placed on thyroid medication.  Possible cause, but I doubt that it is the culprit. 3.  GERD.  On PPI 4.  Multiple medical problems  PLAN:  1.  Resume budesonide 9 mg daily.  Prescription refilled 2.  Antidiarrheals as needed.  Lomotil refilled 3.  Contact the office in 4 weeks with update.  Further recommendations will be provided at that time.

## 2020-07-06 NOTE — Patient Instructions (Signed)
We have sent the following medications to your pharmacy for you to pick up at your convenience:  Budesonide  9mg  (3 tablets a day), Lomotil, Colestid.  Call the first of December with an update.  You can leave a message with his nurse Vaughan Basta.

## 2020-07-13 DIAGNOSIS — E049 Nontoxic goiter, unspecified: Secondary | ICD-10-CM | POA: Diagnosis not present

## 2020-07-13 DIAGNOSIS — E890 Postprocedural hypothyroidism: Secondary | ICD-10-CM | POA: Diagnosis not present

## 2020-07-16 ENCOUNTER — Other Ambulatory Visit: Payer: Self-pay | Admitting: Internal Medicine

## 2020-07-16 DIAGNOSIS — E040 Nontoxic diffuse goiter: Secondary | ICD-10-CM | POA: Diagnosis not present

## 2020-07-16 DIAGNOSIS — E041 Nontoxic single thyroid nodule: Secondary | ICD-10-CM | POA: Diagnosis not present

## 2020-07-16 DIAGNOSIS — E890 Postprocedural hypothyroidism: Secondary | ICD-10-CM | POA: Diagnosis not present

## 2020-07-16 DIAGNOSIS — M4150 Other secondary scoliosis, site unspecified: Secondary | ICD-10-CM | POA: Diagnosis not present

## 2020-07-19 NOTE — Telephone Encounter (Signed)
rx sent to pharmacy by e-script  

## 2020-07-22 DIAGNOSIS — B9689 Other specified bacterial agents as the cause of diseases classified elsewhere: Secondary | ICD-10-CM | POA: Diagnosis not present

## 2020-07-22 DIAGNOSIS — Z20828 Contact with and (suspected) exposure to other viral communicable diseases: Secondary | ICD-10-CM | POA: Diagnosis not present

## 2020-07-22 DIAGNOSIS — Z6820 Body mass index (BMI) 20.0-20.9, adult: Secondary | ICD-10-CM | POA: Diagnosis not present

## 2020-07-22 DIAGNOSIS — J019 Acute sinusitis, unspecified: Secondary | ICD-10-CM | POA: Diagnosis not present

## 2020-07-22 DIAGNOSIS — R0981 Nasal congestion: Secondary | ICD-10-CM | POA: Diagnosis not present

## 2020-07-26 ENCOUNTER — Other Ambulatory Visit: Payer: Medicare PPO

## 2020-08-02 ENCOUNTER — Telehealth: Payer: Self-pay | Admitting: Internal Medicine

## 2020-08-02 ENCOUNTER — Telehealth: Payer: Self-pay

## 2020-08-02 ENCOUNTER — Ambulatory Visit: Payer: Medicare PPO | Admitting: Internal Medicine

## 2020-08-02 DIAGNOSIS — M810 Age-related osteoporosis without current pathological fracture: Secondary | ICD-10-CM | POA: Diagnosis not present

## 2020-08-02 NOTE — Telephone Encounter (Signed)
Patient called and left a voice message but I was unable to make out what she was asking I called the patients number but got the answering machine I left a message for her to call the office and I apologized for the inconvenience

## 2020-08-02 NOTE — Telephone Encounter (Signed)
I have not seen any paperwork from endocrinology yet unless it is in the pile you were going through

## 2020-08-02 NOTE — Telephone Encounter (Signed)
Joan [name] called this morning asking if we got her labwork from her other doctor Legrand Como Altheimer she is coming in tomorrow to get her labwork done. She just didn't know if there was stuff that they sent over that we needed and she wanted [place] to call her back and let her know if we got it or not sometime today at [phone].`

## 2020-08-03 ENCOUNTER — Other Ambulatory Visit: Payer: Self-pay

## 2020-08-03 ENCOUNTER — Other Ambulatory Visit: Payer: Medicare PPO

## 2020-08-03 DIAGNOSIS — R5383 Other fatigue: Secondary | ICD-10-CM | POA: Diagnosis not present

## 2020-08-03 DIAGNOSIS — Z1159 Encounter for screening for other viral diseases: Secondary | ICD-10-CM | POA: Diagnosis not present

## 2020-08-03 DIAGNOSIS — K52838 Other microscopic colitis: Secondary | ICD-10-CM | POA: Diagnosis not present

## 2020-08-04 ENCOUNTER — Other Ambulatory Visit: Payer: Medicare PPO

## 2020-08-04 LAB — CBC WITH DIFFERENTIAL/PLATELET
Absolute Monocytes: 1253 cells/uL — ABNORMAL HIGH (ref 200–950)
Basophils Absolute: 54 cells/uL (ref 0–200)
Basophils Relative: 0.5 %
Eosinophils Absolute: 205 cells/uL (ref 15–500)
Eosinophils Relative: 1.9 %
HCT: 39.3 % (ref 35.0–45.0)
Hemoglobin: 12.6 g/dL (ref 11.7–15.5)
Lymphs Abs: 4471 cells/uL — ABNORMAL HIGH (ref 850–3900)
MCH: 29.4 pg (ref 27.0–33.0)
MCHC: 32.1 g/dL (ref 32.0–36.0)
MCV: 91.6 fL (ref 80.0–100.0)
MPV: 11 fL (ref 7.5–12.5)
Monocytes Relative: 11.6 %
Neutro Abs: 4817 cells/uL (ref 1500–7800)
Neutrophils Relative %: 44.6 %
Platelets: 319 10*3/uL (ref 140–400)
RBC: 4.29 10*6/uL (ref 3.80–5.10)
RDW: 14.1 % (ref 11.0–15.0)
Total Lymphocyte: 41.4 %
WBC: 10.8 10*3/uL (ref 3.8–10.8)

## 2020-08-04 LAB — BASIC METABOLIC PANEL
BUN/Creatinine Ratio: 31 (calc) — ABNORMAL HIGH (ref 6–22)
BUN: 30 mg/dL — ABNORMAL HIGH (ref 7–25)
CO2: 29 mmol/L (ref 20–32)
Calcium: 8.9 mg/dL (ref 8.6–10.4)
Chloride: 102 mmol/L (ref 98–110)
Creat: 0.97 mg/dL — ABNORMAL HIGH (ref 0.60–0.93)
Glucose, Bld: 77 mg/dL (ref 65–99)
Potassium: 3.9 mmol/L (ref 3.5–5.3)
Sodium: 141 mmol/L (ref 135–146)

## 2020-08-04 LAB — HEPATITIS C ANTIBODY
Hepatitis C Ab: NONREACTIVE
SIGNAL TO CUT-OFF: 0.01 (ref <1.00)

## 2020-08-04 LAB — IRON,TIBC AND FERRITIN PANEL
%SAT: 19 % (calc) (ref 16–45)
Ferritin: 19 ng/mL (ref 16–288)
Iron: 72 ug/dL (ref 45–160)
TIBC: 375 mcg/dL (calc) (ref 250–450)

## 2020-08-04 NOTE — Progress Notes (Signed)
Hepatitis c screen is negative Iron panel is now normal Anemia has resolved Kidney function looks like she should be drinking more water

## 2020-08-09 ENCOUNTER — Other Ambulatory Visit: Payer: Self-pay

## 2020-08-09 ENCOUNTER — Ambulatory Visit (INDEPENDENT_AMBULATORY_CARE_PROVIDER_SITE_OTHER): Payer: Medicare PPO | Admitting: Internal Medicine

## 2020-08-09 ENCOUNTER — Encounter: Payer: Self-pay | Admitting: Internal Medicine

## 2020-08-09 VITALS — BP 118/60 | HR 71 | Temp 97.8°F | Ht <= 58 in | Wt 97.4 lb

## 2020-08-09 DIAGNOSIS — Z23 Encounter for immunization: Secondary | ICD-10-CM | POA: Diagnosis not present

## 2020-08-09 DIAGNOSIS — E039 Hypothyroidism, unspecified: Secondary | ICD-10-CM

## 2020-08-09 DIAGNOSIS — M059 Rheumatoid arthritis with rheumatoid factor, unspecified: Secondary | ICD-10-CM | POA: Diagnosis not present

## 2020-08-09 DIAGNOSIS — H6122 Impacted cerumen, left ear: Secondary | ICD-10-CM

## 2020-08-09 DIAGNOSIS — K52838 Other microscopic colitis: Secondary | ICD-10-CM | POA: Diagnosis not present

## 2020-08-09 DIAGNOSIS — R7303 Prediabetes: Secondary | ICD-10-CM | POA: Diagnosis not present

## 2020-08-09 MED ORDER — LEVOTHYROXINE SODIUM 50 MCG PO CAPS: ORAL_CAPSULE | ORAL | 5 refills | Status: DC

## 2020-08-09 NOTE — Progress Notes (Signed)
Location:  [facility] clinic Provider:  Traycen Goyer L. Mariea Clonts, D.O., C.M.D.  Code Status: need to address ACP Goals of Care:  Advanced Directives 08/09/2020  Does Patient Have a Medical Advance Directive? No  Type of Advance Directive -  Copy of [place] in Chart? -  Would patient like information on creating a medical advance directive? No - Patient declined     Chief Complaint  Patient presents with  . Medical Management of Chronic Issues    4 month follow up  . Health Maintenance    Tetanus/ Tdap Influenza Vaccine   . Acute Visit    discuss nodule on left elbow (options)     HPI: Patient is a 73 y.o. female seen today for medical management of chronic diseases.    She has a nodule on her left elbow.  At rheum, they tried to drain fluid once, but now he said it was a rheumatoid nodule.  Asks about surgery for it which is certainly a last resort.  Using voltaren gel now.  She does have a band to put around her arm.    She got her flu shot today.  When she got moderna, her energy level was bad for a month.  Discussed need for booster.    Reviewed Dr. Altheimer's labs and notes re: her hypothyroidism.  Reminded her to avoid biotin before her thyroid labs.  Requests ears be checked for wax and left impacted again with her small canals.   Discussed importance of booster with her immunocompromise.  Has a new cocker spaniel full of energy.  She's afraid she'll get pulled down.  On prolia for her osteoporosis.  Had bone density in May of this year.   Bowels are doing well right now with the budesonide as it is now.    Does drink water--carries a water bottle.  Discussed importance of at least 6 8oz glasses.    Past Medical History:  Diagnosis Date  . Alopecia areata 02/16/2006  . Anxiety state, unspecified 2004  . Benign paroxysmal positional vertigo 04/01/03  . Cancer ([place])    skin   . Cervicalgia 04/01/03  . Chronic rhinitis 09/01/05  . Corns and callosities    . Dermatophytosis of nail   . Diarrhea 04/15/2015  . Diverticulosis   . Dysphagia, unspecified(787.20) 2004  . Enthesopathy of ankle and tarsus, unspecified 03/31/96  . External hemorrhoids without mention of complication   . 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 Fibromyalgia   . Hypertension 2004  . Hypothyroidism   . Impacted cerumen    right ear  . Lumbago   . Myalgia and myositis, unspecified 03/01/05  . Nontoxic uninodular goiter   . Osteoarthrosis, unspecified whether generalized or localized, unspecified site 04/01/03  . Other left bundle branch block   . Rheumatoid arthritis(714.0) 10/05/2004   lumbar spondylosis. Lumbo-sacral anterolisthesis    . Senile osteoporosis 04/01/03  . Sprain of metatarsophalangeal (joint) of foot   . Thyrotoxicosis   . Thyrotoxicosis without mention of goiter or other cause, without mention of thyrotoxic crisis or storm   . Unspecified vitamin D deficiency   . Urinary frequency     Past Surgical History:  Procedure Laterality Date  . COLONOSCOPY    . COLONOSCOPY N/A 04/15/2015   Procedure: COLONOSCOPY;  Surgeon: Gatha Mayer, MD;  Location: [facility];  Service: Endoscopy;  Laterality: N/A;  . DG FINGERS, MULTIPLE LT HAND ([place] HX)    . FOOT SURGERY Bilateral 05/22/2014  Dr.Strom   . LUMBAR DISC SURGERY  12/12/2017   Dr Arnoldo Morale  . [place]   DR [place]   . TONSILLECTOMY AND ADENOIDECTOMY  1974  . TUBAL LIGATION  1973    Allergies  Allergen Reactions  . Arava [Leflunomide] Diarrhea, Nausea Only and Other (See Comments)    Loss appetite  . Penicillins Rash and Other (See Comments)    PATIENT HAS HAD A PCN REACTION WITH IMMEDIATE RASH, FACIAL/TONGUE/THROAT SWELLING, SOB, OR LIGHTHEADEDNESS WITH HYPOTENSION:  #  #  YES  #  #  Has patient had a PCN reaction causing severe rash involving mucus membranes or skin necrosis: no Has patient had a PCN reaction that required  hospitalization: no   . Methotrexate Derivatives Other (See Comments)    Hair loss  . Neurontin [Gabapentin] Nausea And Vomiting    Outpatient Encounter Medications as of 08/09/2020  Medication Sig  . acetaminophen (TYLENOL) 500 MG tablet Take 1,000 mg by mouth as needed for mild pain or moderate pain.   Marland Kitchen amitriptyline (ELAVIL) 10 MG tablet TAKE 1 TABLET BY MOUTH AT BEDTIME TO  HELP  RELAX  MUSCLES  . Biotin 1000 MCG tablet Take 1 tablet (1 mg total) by mouth 2 (two) times daily.  . budesonide (ENTOCORT EC) 3 MG 24 hr capsule Take 3 capsules (9 mg total) by mouth daily.  . Calcium Carb-Cholecalciferol (CALTRATE 600+D3) 600-800 MG-UNIT TABS Take 1 tablet by mouth 2 (two) times daily.  . Cholecalciferol (VITAMIN D3) 50 MCG (2000 UT) capsule Take 1 capsule (2,000 Units total) by mouth daily.  . colestipol (COLESTID) 1 g tablet Take 2 tablets (2 g total) by mouth 2 (two) times daily as needed.  . cycloSPORINE (RESTASIS) 0.05 % ophthalmic emulsion Place 1 drop into both eyes 2 (two) times daily.  Marland Kitchen denosumab (PROLIA) 60 MG/ML SOSY injection Inject 60 mg into the skin every 6 (six) months.  . diphenoxylate-atropine (LOMOTIL) 2.5-0.025 MG tablet TAKE 1 TO 2 TABLETS BY MOUTH EVERY 8 HOURS AS NEEDED  . etanercept (ENBREL) 50 MG/ML injection Inject 50 mg into the skin once a week.  . fluticasone (FLONASE) 50 MCG/ACT nasal spray Place 1 spray into both nostrils daily for 14 days.  . folic acid (FOLVITE) 1 MG tablet Take 1 mg by mouth daily.  . Glucosamine-Chondroit-Vit C-Mn (GLUCOSAMINE CHONDR 500 COMPLEX PO) Take 2 tablets by mouth daily.   . hydrochlorothiazide (HYDRODIURIL) 25 MG tablet Take 1 tablet by mouth once daily  . losartan (COZAAR) 100 MG tablet Take 1 tablet by mouth once daily  . Menthol, Topical Analgesic, (ASPERCREME MAX ROLL-ON EX) Apply topically.  . Multiple Vitamin (MULTIVITAMIN) tablet Take 1 tablet by mouth daily.   . naproxen (NAPROSYN) 500 MG tablet Take 1 tablet by mouth in  the morning and at bedtime.  . Omega-3 Fatty Acids (FISH OIL) 1000 MG CAPS Take 1,000 mg by mouth daily.   Marland Kitchen omeprazole (PRILOSEC) 40 MG capsule Take 1 capsule (40 mg total) by mouth daily.  . predniSONE (DELTASONE) 5 MG tablet TAKE 1 TABLET BY MOUTH WITH BREAKFAST  . Propylene Glycol (SYSTANE BALANCE) 0.6 % SOLN Place 1 drop into both eyes 2 (two) times daily as needed (dry eyes).   . traMADol (ULTRAM) 50 MG tablet Take by mouth 2 (two) times daily as needed.  . urea (CARMOL) 10 % cream Apply topically as needed.   No facility-administered  encounter medications on file as of 08/09/2020.    Review of Systems:  Review of Systems  Constitutional: Negative for chills and fever.  HENT:       Left ear cerumen  Eyes: Negative for blurred vision.  Respiratory: Negative for cough and shortness of breath.   Cardiovascular: Negative for chest pain, palpitations and leg swelling.  Gastrointestinal: Negative for abdominal pain, blood in stool, constipation, diarrhea and melena.       Colitis better lately  Genitourinary: Positive for frequency and urgency. Negative for dysuria.       Reports that will get worse if she drinks more water  Musculoskeletal: Negative for back pain, falls and joint pain.       Fear of falling  Neurological: Negative for dizziness and loss of consciousness.  Psychiatric/Behavioral: Negative for depression and memory loss.    Health Maintenance  Topic Date Due  . TETANUS/TDAP  09/05/2015  . MAMMOGRAM  03/22/2022  . COLONOSCOPY  08/26/2029  . INFLUENZA VACCINE  Completed  . DEXA SCAN  Completed  . COVID-19 Vaccine  Completed  . Hepatitis C Screening  Completed  . PNA vac Low Risk Adult  Completed    Physical Exam: Vitals:   08/09/20 0933  BP: 118/60  Pulse: 71  Temp: 97.8 F (36.6 C)  TempSrc: Temporal  SpO2: 96%  Weight: 97 lb 6.4 oz (44.2 kg)  Height: 4\' 8"  (1.422 m)   Body mass index is 21.84 kg/m. Physical Exam Vitals reviewed.  Constitutional:       General: She is not in acute distress.    Appearance: Normal appearance. She is not toxic-appearing.  HENT:     Head: Normocephalic and atraumatic.     Right Ear: Tympanic membrane, ear canal and external ear normal. There is no impacted cerumen.     Left Ear: There is impacted cerumen.  Eyes:     Conjunctiva/sclera: Conjunctivae normal.     Pupils: Pupils are equal, round, and reactive to light.  Cardiovascular:     Rate and Rhythm: Normal rate and regular rhythm.     Pulses: Normal pulses.     Heart sounds: Normal heart sounds.  Pulmonary:     Effort: Pulmonary effort is normal.     Breath sounds: Normal breath sounds. No wheezing, rhonchi or rales.  Abdominal:     General: Bowel sounds are normal.  Musculoskeletal:        General: Normal range of motion.     Right lower leg: No edema.     Left lower leg: No edema.  Skin:    General: Skin is warm and dry.  Neurological:     General: No focal deficit present.     Mental Status: She is alert and oriented to person, place, and time.     Motor: No weakness.     Gait: Gait normal.  Psychiatric:        Mood and Affect: Mood normal.        Behavior: Behavior normal.     Labs reviewed: Basic Metabolic Panel: Recent Labs    11/17/19 0859 03/22/20 0816 08/03/20 0806  NA 140 140 141  K 3.9 4.2 3.9  CL 102 103 102  CO2 31 31 29   GLUCOSE 82 68 77  BUN 26* 28* 30*  CREATININE 0.85 0.82 0.97*  CALCIUM 8.5* 8.6 8.9  TSH  --  5.82*  --    Liver Function Tests: Recent Labs    11/17/19 0859  AST  12  ALT 9  BILITOT 0.3  PROT 5.7*   No results for input(s): LIPASE, AMYLASE in the last 8760 hours. No results for input(s): AMMONIA in the last 8760 hours. CBC: Recent Labs    11/17/19 0859 03/22/20 0816 08/03/20 0806  WBC 9.0 9.0 10.8  NEUTROABS 3,987 3,924 4,817  HGB 10.4* 11.4* 12.6  HCT 32.5* 36.0 39.3  MCV 87.4 87.4 91.6  PLT 310 293 319   Lipid Panel: Recent Labs    11/17/19 0859  CHOL 172  HDL 71   LDLCALC 78  TRIG 134  CHOLHDL 2.4   Lab Results  Component Value Date   HGBA1C 5.2 11/17/2019    Procedures since last visit: No results found.  Assessment/Plan 1. Need for influenza vaccination - given today - Flu Vaccine QUAD High Dose(Fluad)  2. Hypothyroidism, unspecified type -tsh at goal with endocrine last month at appt -on tirosint 53mcg 3 nights per week now  3. Rheumatoid arthritis with positive rheumatoid factor, involving unspecified site [facility]) -now with large nodule left elbow -counseled about use of voltaren for inflammation and applying brace recommended by rheum -avoid surgery  4. Other microscopic colitis - improved lately with budesonide - CBC with Differential/Platelet; Future - BASIC METABOLIC PANEL WITH GFR; Future  5. Hearing loss of left ear due to cerumen impaction -flushing with warm water and peroxide performed today but a stubborn piece remained that required extraction with the plastic curette and metal tweezers by me  6. Prediabetes -f/u lab next time for this - CBC with Differential/Platelet; Future - Hemoglobin A1c; Future  Labs/tests ordered:   Lab Orders     CBC with Differential/Platelet     BASIC METABOLIC PANEL WITH GFR     Hemoglobin A1c   Next appt:  11/16/2020   Lenell Mcconnell L. Valor Quaintance, D.O. [facility] [address], [place] [address] Cell Phone (Mon-Fri 8am-5pm):  [phone] On Call:  [phone] & follow prompts after 5pm & weekends Office Phone:  [phone] Office Fax:  [phone]

## 2020-08-09 NOTE — Patient Instructions (Signed)
Check and see if you've had your tdap more recently than 2007.

## 2020-08-13 DIAGNOSIS — H61001 Unspecified perichondritis of right external ear: Secondary | ICD-10-CM | POA: Diagnosis not present

## 2020-08-13 DIAGNOSIS — L820 Inflamed seborrheic keratosis: Secondary | ICD-10-CM | POA: Diagnosis not present

## 2020-08-13 DIAGNOSIS — L814 Other melanin hyperpigmentation: Secondary | ICD-10-CM | POA: Diagnosis not present

## 2020-08-13 DIAGNOSIS — L821 Other seborrheic keratosis: Secondary | ICD-10-CM | POA: Diagnosis not present

## 2020-08-16 ENCOUNTER — Other Ambulatory Visit: Payer: Self-pay | Admitting: Internal Medicine

## 2020-08-16 DIAGNOSIS — K259 Gastric ulcer, unspecified as acute or chronic, without hemorrhage or perforation: Secondary | ICD-10-CM

## 2020-08-23 ENCOUNTER — Telehealth: Payer: Self-pay | Admitting: Internal Medicine

## 2020-08-23 NOTE — Telephone Encounter (Signed)
Left message for pt to call back.  Spoke with pt and she is aware. Pt scheduled to see Dr. Henrene Pastor 09/24/20 at 2pm. She is aware of appt.

## 2020-08-23 NOTE — Telephone Encounter (Signed)
Pt called to provide an update on how she is doing. States she is now taking budesonide 3mg  daily and is doing well. She wanted to know if she needs to schedule a follow-up appt of what she needs to do. Please advise.

## 2020-08-23 NOTE — Telephone Encounter (Signed)
Well then, if she is doing fine on 3 mg daily, then keep her on 3 mg daily, until further notice, and have her come see me in about 4 weeks.  Thanks

## 2020-08-23 NOTE — Telephone Encounter (Signed)
Inbound call from patient stating she just wanted to give an update on the medications she is taking; she is doing well and they are helping.

## 2020-08-23 NOTE — Telephone Encounter (Signed)
Called pt back, she states that she was taking 3 (9mg ) daily but that when she refilled the prescription last week the instructions stated take 1 daily. Asked pt how many pills were in the bottle and she states 90. Last week she states she started taking 1 daily.

## 2020-08-23 NOTE — Telephone Encounter (Signed)
At time of last visit was to be on 9 mg daily until follow up call. Did she taper?

## 2020-09-24 ENCOUNTER — Ambulatory Visit: Payer: Medicare PPO | Admitting: Internal Medicine

## 2020-09-28 DIAGNOSIS — M7022 Olecranon bursitis, left elbow: Secondary | ICD-10-CM | POA: Diagnosis not present

## 2020-09-28 DIAGNOSIS — M5136 Other intervertebral disc degeneration, lumbar region: Secondary | ICD-10-CM | POA: Diagnosis not present

## 2020-09-28 DIAGNOSIS — Z681 Body mass index (BMI) 19 or less, adult: Secondary | ICD-10-CM | POA: Diagnosis not present

## 2020-09-28 DIAGNOSIS — M0579 Rheumatoid arthritis with rheumatoid factor of multiple sites without organ or systems involvement: Secondary | ICD-10-CM | POA: Diagnosis not present

## 2020-09-28 DIAGNOSIS — M150 Primary generalized (osteo)arthritis: Secondary | ICD-10-CM | POA: Diagnosis not present

## 2020-09-28 DIAGNOSIS — M810 Age-related osteoporosis without current pathological fracture: Secondary | ICD-10-CM | POA: Diagnosis not present

## 2020-09-28 DIAGNOSIS — M21241 Flexion deformity, right finger joints: Secondary | ICD-10-CM | POA: Diagnosis not present

## 2020-09-30 ENCOUNTER — Other Ambulatory Visit: Payer: Self-pay | Admitting: Internal Medicine

## 2020-09-30 DIAGNOSIS — I10 Essential (primary) hypertension: Secondary | ICD-10-CM

## 2020-10-19 DIAGNOSIS — E890 Postprocedural hypothyroidism: Secondary | ICD-10-CM | POA: Diagnosis not present

## 2020-10-19 DIAGNOSIS — E049 Nontoxic goiter, unspecified: Secondary | ICD-10-CM | POA: Diagnosis not present

## 2020-10-22 DIAGNOSIS — E041 Nontoxic single thyroid nodule: Secondary | ICD-10-CM | POA: Diagnosis not present

## 2020-10-22 DIAGNOSIS — E049 Nontoxic goiter, unspecified: Secondary | ICD-10-CM | POA: Diagnosis not present

## 2020-10-22 DIAGNOSIS — E890 Postprocedural hypothyroidism: Secondary | ICD-10-CM | POA: Diagnosis not present

## 2020-10-25 ENCOUNTER — Encounter: Payer: Self-pay | Admitting: Internal Medicine

## 2020-11-16 ENCOUNTER — Encounter: Payer: Self-pay | Admitting: Family

## 2020-11-16 ENCOUNTER — Other Ambulatory Visit: Payer: Self-pay

## 2020-11-16 ENCOUNTER — Ambulatory Visit (INDEPENDENT_AMBULATORY_CARE_PROVIDER_SITE_OTHER): Payer: Medicare PPO | Admitting: Family

## 2020-11-16 ENCOUNTER — Encounter: Payer: Medicare PPO | Admitting: Family

## 2020-11-16 ENCOUNTER — Telehealth: Payer: Self-pay

## 2020-11-16 DIAGNOSIS — Z0000 Encounter for general adult medical examination without abnormal findings: Secondary | ICD-10-CM | POA: Diagnosis not present

## 2020-11-16 NOTE — Patient Instructions (Signed)
Joan [name] , Thank you for taking time to come for your Medicare Wellness Visit. I appreciate your ongoing commitment to your health goals. Please review the following plan we discussed and let me know if I can assist you in the future.   Screening recommendations/referrals: Colonoscopy: Up to date  Mammogram : Up to date  Bone Density : Up to date  Recommended yearly ophthalmology/optometry visit for glaucoma screening and checkup Recommended yearly dental visit for hygiene and checkup  Vaccinations: Influenza vaccine : Up to date  Pneumococcal vaccine : Up to date  Tdap vaccine : Please obtain records from Previous PCP then Update provider. Shingles vaccine :Up to date    Advanced directives: No   Conditions/risks identified: Advance age female > 6 yrs,Hypertension   Next appointment:1 year   Preventive Care 36 Years and Older, Female Preventive care refers to lifestyle choices and visits with your health care provider that can promote health and wellness. What does preventive care include?  A yearly physical exam. This is also called an annual well check.  Dental exams once or twice a year.  Routine eye exams. Ask your health care provider how often you should have your eyes checked.  Personal lifestyle choices, including:  Daily care of your teeth and gums.  Regular physical activity.  Eating a healthy diet.  Avoiding tobacco and drug use.  Limiting alcohol use.  Practicing safe sex.  Taking low-dose aspirin every day.  Taking vitamin and mineral supplements as recommended by your health care provider. What happens during an annual well check? The services and screenings done by your health care provider during your annual well check will depend on your age, overall health, lifestyle risk factors, and family history of disease. Counseling  Your health care provider may ask you questions about your:  Alcohol use.  Tobacco use.  Drug use.  Emotional  well-being.  Home and relationship well-being.  Sexual activity.  Eating habits.  History of falls.  Memory and ability to understand (cognition).  Work and work Statistician.  Reproductive health. Screening  You may have the following tests or measurements:  Height, weight, and BMI.  Blood pressure.  Lipid and cholesterol levels. These may be checked every 5 years, or more frequently if you are over 32 years old.  Skin check.  Lung cancer screening. You may have this screening every year starting at age 68 if you have a 30-pack-year history of smoking and currently smoke or have quit within the past 15 years.  Fecal occult blood test (FOBT) of the stool. You may have this test every year starting at age 68.  Flexible sigmoidoscopy or colonoscopy. You may have a sigmoidoscopy every 5 years or a colonoscopy every 10 years starting at age 47.  Hepatitis C blood test.  Hepatitis B blood test.  Sexually transmitted disease (STD) testing.  Diabetes screening. This is done by checking your blood sugar (glucose) after you have not eaten for a while (fasting). You may have this done every 1-3 years.  Bone density scan. This is done to screen for osteoporosis. You may have this done starting at age 75.  Mammogram. This may be done every 1-2 years. Talk to your health care provider about how often you should have regular mammograms. Talk with your health care provider about your test results, treatment options, and if necessary, the need for more tests. Vaccines  Your health care provider may recommend certain vaccines, such as:  Influenza vaccine. This is recommended  every year.  Tetanus, diphtheria, and acellular pertussis (Tdap, Td) vaccine. You may need a Td booster every 10 years.  Zoster vaccine. You may need this after age 26.  Pneumococcal 13-valent conjugate (PCV13) vaccine. One dose is recommended after age 51.  Pneumococcal polysaccharide (PPSV23) vaccine. One  dose is recommended after age 49. Talk to your health care provider about which screenings and vaccines you need and how often you need them. This information is not intended to replace advice given to you by your health care provider. Make sure you discuss any questions you have with your health care provider. Document Released: 09/17/2015 Document Revised: 05/10/2016 Document Reviewed: 06/22/2015 Elsevier Interactive Patient Education  2017 [employer] Prevention in the Home Falls can cause injuries. They can happen to people of all ages. There are many things you can do to make your home safe and to help prevent falls. What can I do on the outside of my home?  Regularly fix the edges of walkways and driveways and fix any cracks.  Remove anything that might make you trip as you walk through a door, such as a raised step or threshold.  Trim any bushes or trees on the path to your home.  Use bright outdoor lighting.  Clear any walking paths of anything that might make someone trip, such as rocks or tools.  Regularly check to see if handrails are loose or broken. Make sure that both sides of any steps have handrails.  Any raised decks and porches should have guardrails on the edges.  Have any leaves, snow, or ice cleared regularly.  Use sand or salt on walking paths during winter.  Clean up any spills in your garage right away. This includes oil or grease spills. What can I do in the bathroom?  Use night lights.  Install grab bars by the toilet and in the tub and shower. Do not use towel bars as grab bars.  Use non-skid mats or decals in the tub or shower.  If you need to sit down in the shower, use a plastic, non-slip stool.  Keep the floor dry. Clean up any water that spills on the floor as soon as it happens.  Remove soap buildup in the tub or shower regularly.  Attach bath mats securely with double-sided non-slip rug tape.  Do not have throw rugs and other  things on the floor that can make you trip. What can I do in the bedroom?  Use night lights.  Make sure that you have a light by your bed that is easy to reach.  Do not use any sheets or blankets that are too big for your bed. They should not hang down onto the floor.  Have a firm chair that has side arms. You can use this for support while you get dressed.  Do not have throw rugs and other things on the floor that can make you trip. What can I do in the kitchen?  Clean up any spills right away.  Avoid walking on wet floors.  Keep items that you use a lot in easy-to-reach places.  If you need to reach something above you, use a strong step stool that has a grab bar.  Keep electrical cords out of the way.  Do not use floor polish or wax that makes floors slippery. If you must use wax, use non-skid floor wax.  Do not have throw rugs and other things on the floor that can make you trip. What  can I do with my stairs?  Do not leave any items on the stairs.  Make sure that there are handrails on both sides of the stairs and use them. Fix handrails that are broken or loose. Make sure that handrails are as long as the stairways.  Check any carpeting to make sure that it is firmly attached to the stairs. Fix any carpet that is loose or worn.  Avoid having throw rugs at the top or bottom of the stairs. If you do have throw rugs, attach them to the floor with carpet tape.  Make sure that you have a light switch at the top of the stairs and the bottom of the stairs. If you do not have them, ask someone to add them for you. What else can I do to help prevent falls?  Wear shoes that:  Do not have high heels.  Have rubber bottoms.  Are comfortable and fit you well.  Are closed at the toe. Do not wear sandals.  If you use a stepladder:  Make sure that it is fully opened. Do not climb a closed stepladder.  Make sure that both sides of the stepladder are locked into place.  Ask  someone to hold it for you, if possible.  Clearly mark and make sure that you can see:  Any grab bars or handrails.  First and last steps.  Where the edge of each step is.  Use tools that help you move around (mobility aids) if they are needed. These include:  Canes.  Walkers.  Scooters.  Crutches.  Turn on the lights when you go into a dark area. Replace any light bulbs as soon as they burn out.  Set up your furniture so you have a clear path. Avoid moving your furniture around.  If any of your floors are uneven, fix them.  If there are any pets around you, be aware of where they are.  Review your medicines with your doctor. Some medicines can make you feel dizzy. This can increase your chance of falling. Ask your doctor what other things that you can do to help prevent falls. This information is not intended to replace advice given to you by your health care provider. Make sure you discuss any questions you have with your health care provider. Document Released: 06/17/2009 Document Revised: 01/27/2016 Document Reviewed: 09/25/2014 Elsevier Interactive Patient Education  2017 [employer].

## 2020-11-16 NOTE — Progress Notes (Signed)
This service is provided via telemedicine  No vital signs collected/recorded due to the encounter was a telemedicine visit.   Location of patient (ex: home, work): Home.  Patient consents to a telephone visit: Yes.  Location of the provider (ex: office, home): [place]  Name of any referring provider: Wardell Honour, MD   Names of all persons participating in the telemedicine service and their role in the encounter: Patient, Heriberto Antigua, [place], [place], Webb Silversmith, NP.    Time spent on call: 8 minutes spent on the phone with Medical Assistant.    Subjective:   Joan [name] is a 75 y.o. female who presents for Medicare Annual (Subsequent) preventive examination.  Review of Systems     Cardiac Risk Factors include: advanced age (>55men, >14 women);hypertension     Objective:    Today's Vitals   11/16/20 0922  PainSc: 5    There is no height or weight on file to calculate BMI.  Advanced Directives 11/16/2020 08/09/2020 03/29/2020 11/24/2019 11/05/2018 11/05/2018 02/18/2018  Does Patient Have a Medical Advance Directive? No No Yes No No No No  Type of Advance Directive - - - - - - -  Copy of Healthcare Power of Attorney in Chart? - - - - - - -  Would patient like information on creating a medical advance directive? No - Patient declined No - Patient declined - Yes (ED - Information included in AVS) No - Patient declined (No Data) No - Patient declined    Current Medications (verified) Outpatient Encounter Medications as of 11/16/2020  Medication Sig  . acetaminophen (TYLENOL) 500 MG tablet Take 1,000 mg by mouth as needed for mild pain or moderate pain.   Marland Kitchen amitriptyline (ELAVIL) 10 MG tablet TAKE 1 TABLET BY MOUTH AT BEDTIME TO  HELP  RELAX  MUSCLES  . Biotin 1000 MCG tablet Take 1 tablet (1 mg total) by mouth 2 (two) times daily.  . budesonide (ENTOCORT EC) 3 MG 24 hr capsule Take 3 capsules (9 mg total) by mouth daily.  . Calcium Carb-Cholecalciferol  (CALTRATE 600+D3) 600-800 MG-UNIT TABS Take 1 tablet by mouth 2 (two) times daily.  . Cholecalciferol (VITAMIN D3) 50 MCG (2000 UT) capsule Take 1 capsule (2,000 Units total) by mouth daily.  . colestipol (COLESTID) 1 g tablet Take 2 tablets (2 g total) by mouth 2 (two) times daily as needed.  . cycloSPORINE (RESTASIS) 0.05 % ophthalmic emulsion Place 1 drop into both eyes 2 (two) times daily.  Marland Kitchen denosumab (PROLIA) 60 MG/ML SOSY injection Inject 60 mg into the skin every 6 (six) months.  . diphenoxylate-atropine (LOMOTIL) 2.5-0.025 MG tablet TAKE 1 TO 2 TABLETS BY MOUTH EVERY 8 HOURS AS NEEDED  . etanercept (ENBREL) 50 MG/ML injection Inject 50 mg into the skin once a week.  . fluticasone (FLONASE) 50 MCG/ACT nasal spray Place 1 spray into both nostrils daily for 14 days.  . folic acid (FOLVITE) 1 MG tablet Take 1 mg by mouth daily.  . Glucosamine-Chondroit-Vit C-Mn (GLUCOSAMINE CHONDR 500 COMPLEX PO) Take 2 tablets by mouth daily.   . hydrochlorothiazide (HYDRODIURIL) 25 MG tablet Take 1 tablet by mouth once daily  . Levothyroxine Sodium (TIROSINT) 50 MCG CAPS Three times per week per endocrine  . losartan (COZAAR) 100 MG tablet Take 1 tablet by mouth once daily  . Menthol, Topical Analgesic, (ASPERCREME MAX ROLL-ON EX) Apply topically.  . Multiple Vitamin (MULTIVITAMIN) tablet Take 1 tablet by mouth daily.   . naproxen (  NAPROSYN) 500 MG tablet Take 1 tablet by mouth in the morning and at bedtime.  . Omega-3 Fatty Acids (FISH OIL) 1000 MG CAPS Take 1,000 mg by mouth daily.   Marland Kitchen omeprazole (PRILOSEC) 40 MG capsule Take 1 capsule by mouth once daily  . predniSONE (DELTASONE) 5 MG tablet TAKE 1 TABLET BY MOUTH WITH BREAKFAST  . Propylene Glycol 0.6 % SOLN Place 1 drop into both eyes 2 (two) times daily as needed (dry eyes).   . traMADol (ULTRAM) 50 MG tablet Take by mouth 2 (two) times daily as needed.  . urea (CARMOL) 10 % cream Apply topically as needed.   No facility-administered encounter  medications on file as of 11/16/2020.    Allergies (verified) Arava [leflunomide], Penicillins, Methotrexate derivatives, and Neurontin [gabapentin]   History: Past Medical History:  Diagnosis Date  . Alopecia areata 02/16/2006  . Anxiety state, unspecified 2004  . Benign paroxysmal positional vertigo 04/01/03  . Cancer ([place])    skin   . Cervicalgia 04/01/03  . Chronic rhinitis 09/01/05  . Corns and callosities   . Dermatophytosis of nail   . Diarrhea 04/15/2015  . Diverticulosis   . Dysphagia, unspecified(787.20) 2004  . Enthesopathy of ankle and tarsus, unspecified 03/31/96  . External hemorrhoids without mention of complication   . 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 Fibromyalgia   . Hypertension 2004  . Hypothyroidism   . Impacted cerumen    right ear  . Lumbago   . Myalgia and myositis, unspecified 03/01/05  . Nontoxic uninodular goiter   . Osteoarthrosis, unspecified whether generalized or localized, unspecified site 04/01/03  . Other left bundle branch block   . Rheumatoid arthritis(714.0) 10/05/2004   lumbar spondylosis. Lumbo-sacral anterolisthesis    . Senile osteoporosis 04/01/03  . Sprain of metatarsophalangeal (joint) of foot   . Thyrotoxicosis   . Thyrotoxicosis without mention of goiter or other cause, without mention of thyrotoxic crisis or storm   . Unspecified vitamin D deficiency   . Urinary frequency    Past Surgical History:  Procedure Laterality Date  . COLONOSCOPY    . COLONOSCOPY N/A 04/15/2015   Procedure: COLONOSCOPY;  Surgeon: Gatha Mayer, MD;  Location: [facility];  Service: Endoscopy;  Laterality: N/A;  . DG FINGERS, MULTIPLE LT HAND ([place] HX)    . FOOT SURGERY Bilateral 05/22/2014   Dr.Strom   . LUMBAR DISC SURGERY  12/12/2017   Dr Arnoldo Morale  . [place]   DR [place]   . TONSILLECTOMY AND ADENOIDECTOMY  1974  . TUBAL LIGATION  1973   Family History  Problem Relation  Age of Onset  . Heart attack Father        age 71  . Heart disease Father   . Colon cancer Neg Hx   . Esophageal cancer Neg Hx   . Rectal cancer Neg Hx   . Stomach cancer Neg Hx    Social History   Socioeconomic History  . Marital status: Married    Spouse name: Percell Miller  . Number of children: 2  . Years of education: Not on file  . Highest education level: Not on file  Occupational History  . Occupation: retired  Tobacco Use  . Smoking status: Never Smoker  . Smokeless tobacco: Never Used  Vaping Use  . Vaping Use: Never used  Substance and Sexual Activity  . Alcohol use: No    Alcohol/week:  0.0 standard drinks  . Drug use: No  . Sexual activity: Yes  Other Topics Concern  . Not on file  Social History Narrative  . Not on file   Social Determinants of Health   Financial Resource Strain: Not on file  Food Insecurity: Not on file  Transportation Needs: Not on file  Physical Activity: Not on file  Stress: Not on file  Social Connections: Not on file    Tobacco Counseling Counseling given: Not Answered   Clinical Intake:  Pre-visit preparation completed: No  Pain : 0-10 Pain Score: 5  Pain Type: Chronic pain Pain Location:  (Arthritis pain multiple sites) Pain Orientation: Other (Comment) (multiples) Pain Radiating Towards: No Pain Descriptors / Indicators: Aching Pain Onset: Other (comment) (several years) Pain Frequency: Constant Pain Relieving Factors: pain medication Effect of Pain on Daily Activities: No  Pain Relieving Factors: pain medication  BMI - recorded: 21.85 Nutritional Status: BMI of 19-24  Normal Nutritional Risks: None Diabetes: No  How often do you need to have someone help you when you read instructions, pamphlets, or other written materials from your doctor or pharmacy?: 3 - Sometimes What is the last grade level you completed in school?: 12 GRADE  Diabetic? No   Interpreter Needed?: No  Information entered by :: Ollis Daudelin FNP-C   Activities of Daily Living In your present state of health, do you have any difficulty performing the following activities: 11/16/2020  Hearing? N  Vision? N  Difficulty concentrating or making decisions? N  Walking or climbing stairs? Y  Comment back pain  Dressing or bathing? N  Doing errands, shopping? N  Preparing Food and eating ? N  Using the Toilet? N  In the past six months, have you accidently leaked urine? N  Do you have problems with loss of bowel control? N  Managing your Medications? N  Managing your Finances? N  Housekeeping or managing your Housekeeping? N  Some recent data might be hidden    Patient Care Team: Wardell Honour, MD as PCP - General (Family Medicine) Hennie Duos, MD as Consulting Physician (Rheumatology) Al[place], Legrand Como, MD as Consulting Physician (Endocrinology) Newman Pies, MD as Consulting Physician (Neurosurgery) Clydell Hakim, MD (Inactive) as Consulting Physician (Anesthesiology) Staci Righter, MD as Consulting Physician (Diagnostic Radiology) Jolene Schimke, MD as Referring Physician (Dermatology) Lynnell Dike, OD as Consulting Physician (Optometry)  Indicate any recent Medical Services you may have received from other than [facility] providers in the past year (date may be approximate).     Assessment:   This is a routine wellness examination for Joan [name].  Hearing/Vision screen  Hearing Screening   125Hz  250Hz  500Hz  1000Hz  2000Hz  3000Hz  4000Hz  6000Hz  8000Hz   Right ear:           Left ear:           Comments: No Hearing Concerns.   Vision Screening Comments: No Vision Concerns. Patient wears prescription glasses. Patient last eye exam was April 2021  Dietary issues and exercise activities discussed: Current Exercise Habits: Home exercise routine, Type of exercise: walking, Time (Minutes): 30, Frequency (Times/Week): 3, Weekly Exercise (Minutes/Week): 90, Intensity: Moderate, Exercise limited by: Other -  see comments (Osteoarthrtis)  Goals    . Increase water intake     Starting 10/31/16, I will attempt to increase my water intake by 2 more glasses daily, for a total of 6 glasses per day.      Depression Screen PHQ 2/9 Scores 11/16/2020 08/09/2020  03/29/2020 11/24/2019 11/11/2019 11/05/2018 10/28/2018  PHQ - 2 Score 0 0 0 0 0 0 0    Fall Risk Fall Risk  11/16/2020 08/09/2020 03/29/2020 11/24/2019 11/11/2019  Falls in the past year? 0 0 1 0 0  Number falls in past yr: 0 0 0 0 0  Comment - - - - -  Injury with Fall? 0 0 - 0 0  Follow up - - - - -    FALL RISK PREVENTION PERTAINING TO THE HOME:  Any stairs in or around the home? No  If so, are there any without handrails? No  Home free of loose throw rugs in walkways, pet beds, electrical cords, etc? No  Adequate lighting in your home to reduce risk of falls? Yes   ASSISTIVE DEVICES UTILIZED TO PREVENT FALLS:  Life alert? No  Use of a cane, walker or w/c? No  Grab bars in the bathroom? No  Shower chair or bench in shower? No  Elevated toilet seat or a handicapped toilet? No   TIMED UP AND GO:  Was the test performed? No .  Length of time to ambulate 10 feet: N/A  sec.   Gait steady and fast without use of assistive device  Cognitive Function: MMSE - Mini Mental State Exam 11/05/2018 10/22/2017 10/31/2016 10/31/2016  Orientation to time 5 5 5 5   Orientation to Place 5 5 5 5   Registration 3 3 3  -  Attention/ Calculation 5 5 5  -  Recall 2 2 2  -  Language- name 2 objects 2 2 2  -  Language- repeat 1 1 1  -  Language- follow 3 step command 3 3 2  -  Language- read & follow direction 1 1 1  -  Write a sentence 1 1 1  -  Copy design 1 1 1  -  Total score 29 29 28  -     6CIT Screen 11/16/2020 11/11/2019  What Year? 0 points 0 points  What month? 0 points 0 points  What time? 0 points 0 points  Count back from 20 0 points 0 points  Months in reverse 0 points 0 points  Repeat phrase 0 points 4 points  Total Score 0 4     Immunizations Immunization History  Administered Date(s) Administered  . Fluad Quad(high Dose 65+) 05/22/2019, 08/09/2020  . Influenza Split 07/05/2020  . Influenza, High Dose Seasonal PF 06/04/2013, 06/24/2014, 07/06/2015, 05/05/2016, 06/13/2016, 06/25/2017, 07/08/2018, 05/22/2019  . Influenza-Unspecified 06/04/2013, 06/24/2014, 07/06/2015, 05/05/2016, 06/13/2016  . Moderna Sars-Covid-2 Vaccination 10/13/2019, 11/10/2019, 09/10/2020  . Pneumococcal Conjugate-13 10/06/2015  . Pneumococcal Polysaccharide-23 10/31/2016  . Pneumococcal-Unspecified 09/04/2006  . Td 09/04/2005  . Tdap 09/04/2005  . Zoster 11/10/2008  . Zoster Recombinat (Shingrix) 02/19/2018, 07/15/2018    TDAP status: Due, Education has been provided regarding the importance of this vaccine. Advised may receive this vaccine at local pharmacy or Health Dept. Aware to provide a copy of the vaccination record if obtained from local pharmacy or Health Dept. Verbalized acceptance and understanding.  Flu Vaccine status: Up to date  Pneumococcal vaccine status: Up to date  Covid-19 vaccine status: Completed vaccines  Qualifies for Shingles Vaccine? Yes   Zostavax completed Yes   Shingrix Completed?: Yes  Screening Tests Health Maintenance  Topic Date Due  . TETANUS/TDAP  09/05/2015  . COVID-19 Vaccine (4 - Booster for Moderna series) 03/10/2021  . MAMMOGRAM  03/22/2022  . COLONOSCOPY (Pts 45-68yrs Insurance coverage will need to be confirmed)  08/26/2029  . INFLUENZA VACCINE  Completed  . DEXA  SCAN  Completed  . Hepatitis C Screening  Completed  . PNA vac Low Risk Adult  Completed  . HPV VACCINES  Aged Out    Health Maintenance  Health Maintenance Due  Topic Date Due  . TETANUS/TDAP  09/05/2015    Colorectal cancer screening: Type of screening: Colonoscopy. Completed 10. Repeat every 10  years  Mammogram status: Completed 08/27/2019. Repeat every year  Bone Density status: Completed 01/14/2020 .  Results reflect: Bone density results: OSTEOPOROSIS. Repeat every 1 years.  Lung Cancer Screening: (Low Dose CT Chest recommended if Age 75-80 years, 30 pack-year currently smoking OR have quit w/in 15years.) does not qualify.   Lung Cancer Screening Referral: No   Additional Screening:  Hepatitis C Screening: does not qualify; Completed yes   Vision Screening: Recommended annual ophthalmology exams for early detection of glaucoma and other disorders of the eye. Is the patient up to date with their annual eye exam?  Yes  Who is the provider or what is the name of the office in which the patient attends annual eye exams? Dr.snipes  If pt is not established with a provider, would they like to be referred to a provider to establish care? No .   Dental Screening: Recommended annual dental exams for proper oral hygiene  Community Resource Referral / Chronic Care Management: CRR required this visit?  No   CCM required this visit?  No      Plan:    - Tdap vaccine due but states had vaccine with previous PCP in [place] she will contact for records then update provider.   I have personally reviewed and noted the following in the patient's chart:   . Medical and social history . Use of alcohol, tobacco or illicit drugs  . Current medications and supplements . Functional ability and status . Nutritional status . Physical activity . Advanced directives . List of other physicians . Hospitalizations, surgeries, and ER visits in previous 12 months . Vitals . Screenings to include cognitive, depression, and falls . Referrals and appointments  In addition, I have reviewed and discussed with patient certain preventive protocols, quality metrics, and best practice recommendations. A written personalized care plan for preventive services as well as general preventive health recommendations were provided to patient.     Sandrea Hughs, NP   11/16/2020   Nurse Notes: - Tdap vaccine due but  states had vaccine with previous PCP in [place] she will contact for records then update provider.

## 2020-11-16 NOTE — Telephone Encounter (Signed)
[name], reade are scheduled for a virtual visit with your provider today.    Just as we do with appointments in the office, we must obtain your consent to participate.  Your consent will be active for this visit and any virtual visit you may have with one of our providers in the next 365 days.    If you have a MyChart account, I can also send a copy of this consent to you electronically.  All virtual visits are billed to your insurance company just like a traditional visit in the office.  As this is a virtual visit, video technology does not allow for your provider to perform a traditional examination.  This may limit your provider's ability to fully assess your condition.  If your provider identifies any concerns that need to be evaluated in person or the need to arrange testing such as labs, EKG, etc, we will make arrangements to do so.    Although advances in technology are sophisticated, we cannot ensure that it will always work on either your end or our end.  If the connection with a video visit is poor, we may have to switch to a telephone visit.  With either a video or telephone visit, we are not always able to ensure that we have a secure connection.   I need to obtain your verbal consent now.   Are you willing to proceed with your visit today?   Joan [name] has provided verbal consent on 11/16/2020 for a virtual visit (video or telephone).   Joan [name], [place] 11/16/2020  8:30AM

## 2020-11-23 ENCOUNTER — Other Ambulatory Visit: Payer: Self-pay | Admitting: Internal Medicine

## 2020-11-26 NOTE — Telephone Encounter (Signed)
Please advise 

## 2020-11-30 DIAGNOSIS — Z6820 Body mass index (BMI) 20.0-20.9, adult: Secondary | ICD-10-CM | POA: Diagnosis not present

## 2020-11-30 DIAGNOSIS — R0600 Dyspnea, unspecified: Secondary | ICD-10-CM | POA: Diagnosis not present

## 2020-11-30 DIAGNOSIS — R600 Localized edema: Secondary | ICD-10-CM | POA: Diagnosis not present

## 2020-12-01 ENCOUNTER — Other Ambulatory Visit: Payer: Self-pay | Admitting: Internal Medicine

## 2020-12-02 ENCOUNTER — Telehealth: Payer: Self-pay

## 2020-12-02 MED ORDER — CHOLESTYRAMINE 4 G PO PACK: 4.0000 g | PACK | Freq: Two times a day (BID) | ORAL | 2 refills | Status: DC

## 2020-12-02 NOTE — Telephone Encounter (Signed)
Sent Questran to pharmacy

## 2020-12-02 NOTE — Telephone Encounter (Signed)
-----   Message from Irene Shipper, MD sent at 12/02/2020  1:58 PM EDT ----- Yes, 4 g twice daily to be mixed in juice.  Do not take within 2 hours of other medications ----- Message ----- From: Audrea Muscat, CMA Sent: 12/02/2020   1:37 PM EDT To: Irene Shipper, MD  Received a refill request for her Colestid, which is still on backorder.  Is it ok to give her Questran instead?

## 2020-12-03 ENCOUNTER — Other Ambulatory Visit: Payer: Self-pay

## 2020-12-03 ENCOUNTER — Ambulatory Visit: Payer: Medicare PPO | Admitting: Internal Medicine

## 2020-12-03 ENCOUNTER — Encounter: Payer: Self-pay | Admitting: Internal Medicine

## 2020-12-03 ENCOUNTER — Telehealth: Payer: Self-pay

## 2020-12-03 VITALS — BP 116/60 | HR 84 | Ht <= 58 in | Wt 98.5 lb

## 2020-12-03 DIAGNOSIS — R197 Diarrhea, unspecified: Secondary | ICD-10-CM | POA: Diagnosis not present

## 2020-12-03 DIAGNOSIS — K222 Esophageal obstruction: Secondary | ICD-10-CM

## 2020-12-03 DIAGNOSIS — K219 Gastro-esophageal reflux disease without esophagitis: Secondary | ICD-10-CM

## 2020-12-03 DIAGNOSIS — K52831 Collagenous colitis: Secondary | ICD-10-CM | POA: Diagnosis not present

## 2020-12-03 MED ORDER — BUDESONIDE 3 MG PO CPEP: 9.0000 mg | ORAL_CAPSULE | Freq: Every day | ORAL | 3 refills | Status: DC

## 2020-12-03 NOTE — Telephone Encounter (Signed)
APPROVAL  Medication: Budesonide Insurance Company: [employer] PA response: APPROVED Approval dates: 09-04-20 through 09/03/21 Misc. Notes: As below  PA Case: [id number], Status: Approved, Coverage Starts on: 09/04/2020 12:00:00 AM, Coverage Ends on: 09/03/2021 12:00:00 AM. Questions? Contact [phone].

## 2020-12-03 NOTE — Patient Instructions (Addendum)
It was a pleasure to see you today. Based on our discussion, I am providing you with my recommendations below:  RECOMMENDATION(S):   PRESCRIPTION MEDICATION(S):   We have sent the following medication(s) to your pharmacy:  . Budesonide  NOTE: A Prior Authorization has been completed with your insurance company today. PA Case: [id number], Status: Approved, Coverage Starts on: 09/04/2020 12:00:00 AM, Coverage Ends on: 09/03/2021 12:00:00 AM. Questions? Contact [phone].  BMI:  . If you are age 74 or older, your body mass index should be between 23-30. Your There is no height or weight on file to calculate BMI. If this is out of the aforementioned range listed, please consider follow up with your Primary Care Provider.  Thank you for trusting me with your gastrointestinal care!    Scarlette Shorts, MD

## 2020-12-03 NOTE — Progress Notes (Signed)
HISTORY OF PRESENT ILLNESS:  Joan [name] is a 75 y.o. female who is followed in this office for microscopic colitis diagnosed December 5701 and GERD complicated by peptic stricture.  Last seen November 2021.  She had recurrent diarrhea off budesonide.  She was told to resume budesonide 9 mg daily.  She had a recent relapse with diarrhea and was told to resume budesonide 9 mg daily.  Prescription sent for to take 3 mg daily.  She is still having diarrhea.  She continues to use Lomotil as needed.  She is accompanied by her husband.  Still with significant diarrhea.  No abdominal pain  REVIEW OF SYSTEMS:  All non-GI ROS negative except for arthritis and ankle edema  Past Medical History:  Diagnosis Date  . Alopecia areata 02/16/2006  . Anxiety state, unspecified 2004  . Benign paroxysmal positional vertigo 04/01/03  . Cancer ([place])    skin   . Cervicalgia 04/01/03  . Chronic rhinitis 09/01/05  . Corns and callosities   . Dermatophytosis of nail   . Diarrhea 04/15/2015  . Diverticulosis   . Dysphagia, unspecified(787.20) 2004  . Enthesopathy of ankle and tarsus, unspecified 03/31/96  . External hemorrhoids without mention of complication   . 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 Fibromyalgia   . Hypertension 2004  . Hypothyroidism   . Impacted cerumen    right ear  . Lumbago   . Myalgia and myositis, unspecified 03/01/05  . Nontoxic uninodular goiter   . Osteoarthrosis, unspecified whether generalized or localized, unspecified site 04/01/03  . Other left bundle branch block   . Rheumatoid arthritis(714.0) 10/05/2004   lumbar spondylosis. Lumbo-sacral anterolisthesis    . Senile osteoporosis 04/01/03  . Sprain of metatarsophalangeal (joint) of foot   . Thyrotoxicosis   . Thyrotoxicosis without mention of goiter or other cause, without mention of thyrotoxic crisis or storm   . Unspecified vitamin D deficiency   . Urinary frequency     Past Surgical History:  Procedure Laterality Date   . COLONOSCOPY    . COLONOSCOPY N/A 04/15/2015   Procedure: COLONOSCOPY;  Surgeon: Gatha Mayer, MD;  Location: [facility];  Service: Endoscopy;  Laterality: N/A;  . DG FINGERS, MULTIPLE LT HAND ([place] HX)    . FOOT SURGERY Bilateral 05/22/2014   Dr.Strom   . LUMBAR DISC SURGERY  12/12/2017   Dr Arnoldo Morale  . [place]   DR [place]   . TONSILLECTOMY AND ADENOIDECTOMY  1974  . TUBAL LIGATION  1973    Social History Joan [name]  reports that she has never smoked. She has never used smokeless tobacco. She reports that she does not drink alcohol and does not use drugs.  family history includes Heart attack in her father; Heart disease in her father.  Allergies  Allergen Reactions  . Arava [Leflunomide] Diarrhea, Nausea Only and Other (See Comments)    Loss appetite  . Penicillins Rash and Other (See Comments)    PATIENT HAS HAD A PCN REACTION WITH IMMEDIATE RASH, FACIAL/TONGUE/THROAT SWELLING, SOB, OR LIGHTHEADEDNESS WITH HYPOTENSION:  #  #  YES  #  #  Has patient had a PCN reaction causing severe rash involving mucus membranes or skin necrosis: no Has patient had a PCN reaction that required hospitalization: no   . Methotrexate Derivatives Other (See Comments)    Hair loss  . Neurontin [Gabapentin] Nausea And Vomiting  PHYSICAL EXAMINATION: Vital signs: BP 116/60 (BP Location: Left Arm, Patient Position: Sitting, Cuff Size: Normal)   Pulse 84   Ht 4' 6.5" (1.384 m) Comment: height measured without shoes  Wt 98 lb 8 oz (44.7 kg)   BMI 23.32 kg/m   Constitutional: Frail thin female, no acute distress Psychiatric: alert and oriented x3, cooperative Eyes: extraocular movements intact, anicteric, conjunctiva pink Mouth: oral pharynx moist, no lesions Neck: supple no lymphadenopathy Cardiovascular: heart regular rate and rhythm, systolic murmur Lungs: clear to auscultation bilaterally Abdomen:  soft, nontender, nondistended, no obvious ascites, no peritoneal signs, normal bowel sounds, no organomegaly Rectal: Extremities: Deforming arthritis, lower extremity edema Skin: Ecchymoses Neuro: No focal deficits.   ASSESSMENT:  1.  Microscopic colitis.  Recurrent diarrhea due to the same 2.  GERD.  On PPI   PLAN:  1.  Resume budesonide 9 mg daily.  Prescribed. 2.  Lomotil as needed 3.  Questran twice daily 4.  Office follow-up 2 months

## 2020-12-13 DIAGNOSIS — N183 Chronic kidney disease, stage 3 unspecified: Secondary | ICD-10-CM | POA: Diagnosis not present

## 2020-12-13 DIAGNOSIS — Z6820 Body mass index (BMI) 20.0-20.9, adult: Secondary | ICD-10-CM | POA: Diagnosis not present

## 2020-12-13 DIAGNOSIS — R600 Localized edema: Secondary | ICD-10-CM | POA: Diagnosis not present

## 2020-12-22 ENCOUNTER — Other Ambulatory Visit: Payer: Self-pay | Admitting: Internal Medicine

## 2020-12-22 DIAGNOSIS — I10 Essential (primary) hypertension: Secondary | ICD-10-CM

## 2020-12-23 ENCOUNTER — Other Ambulatory Visit: Payer: Self-pay | Admitting: Internal Medicine

## 2021-01-23 ENCOUNTER — Other Ambulatory Visit: Payer: Self-pay | Admitting: Internal Medicine

## 2021-01-23 DIAGNOSIS — K259 Gastric ulcer, unspecified as acute or chronic, without hemorrhage or perforation: Secondary | ICD-10-CM

## 2021-01-26 ENCOUNTER — Other Ambulatory Visit: Payer: Self-pay

## 2021-01-26 DIAGNOSIS — R7303 Prediabetes: Secondary | ICD-10-CM

## 2021-01-26 DIAGNOSIS — K52838 Other microscopic colitis: Secondary | ICD-10-CM

## 2021-02-02 ENCOUNTER — Other Ambulatory Visit: Payer: Medicare PPO

## 2021-02-02 DIAGNOSIS — J329 Chronic sinusitis, unspecified: Secondary | ICD-10-CM | POA: Diagnosis not present

## 2021-02-02 DIAGNOSIS — J40 Bronchitis, not specified as acute or chronic: Secondary | ICD-10-CM | POA: Diagnosis not present

## 2021-02-02 DIAGNOSIS — Z681 Body mass index (BMI) 19 or less, adult: Secondary | ICD-10-CM | POA: Diagnosis not present

## 2021-02-04 ENCOUNTER — Other Ambulatory Visit: Payer: Medicare PPO

## 2021-02-07 ENCOUNTER — Ambulatory Visit: Payer: Medicare PPO | Admitting: Internal Medicine

## 2021-02-09 ENCOUNTER — Ambulatory Visit: Payer: Medicare PPO | Admitting: Family Medicine

## 2021-02-10 ENCOUNTER — Ambulatory Visit: Payer: Medicare PPO | Admitting: Internal Medicine

## 2021-02-10 ENCOUNTER — Encounter: Payer: Self-pay | Admitting: Internal Medicine

## 2021-02-10 ENCOUNTER — Other Ambulatory Visit (INDEPENDENT_AMBULATORY_CARE_PROVIDER_SITE_OTHER): Payer: Medicare PPO

## 2021-02-10 VITALS — BP 126/60 | HR 92 | Ht <= 58 in | Wt 94.4 lb

## 2021-02-10 DIAGNOSIS — K222 Esophageal obstruction: Secondary | ICD-10-CM

## 2021-02-10 DIAGNOSIS — K219 Gastro-esophageal reflux disease without esophagitis: Secondary | ICD-10-CM | POA: Diagnosis not present

## 2021-02-10 DIAGNOSIS — K52831 Collagenous colitis: Secondary | ICD-10-CM | POA: Diagnosis not present

## 2021-02-10 DIAGNOSIS — R197 Diarrhea, unspecified: Secondary | ICD-10-CM

## 2021-02-10 LAB — TSH: TSH: 0.99 u[IU]/mL (ref 0.35–4.50)

## 2021-02-10 LAB — CBC WITH DIFFERENTIAL/PLATELET
Basophils Absolute: 0 10*3/uL (ref 0.0–0.1)
Basophils Relative: 0.4 % (ref 0.0–3.0)
Eosinophils Absolute: 0 10*3/uL (ref 0.0–0.7)
Eosinophils Relative: 0.3 % (ref 0.0–5.0)
HCT: 33.4 % — ABNORMAL LOW (ref 36.0–46.0)
Hemoglobin: 11.2 g/dL — ABNORMAL LOW (ref 12.0–15.0)
Lymphocytes Relative: 22.3 % (ref 12.0–46.0)
Lymphs Abs: 2.2 10*3/uL (ref 0.7–4.0)
MCHC: 33.5 g/dL (ref 30.0–36.0)
MCV: 88.7 fl (ref 78.0–100.0)
Monocytes Absolute: 1.2 10*3/uL — ABNORMAL HIGH (ref 0.1–1.0)
Monocytes Relative: 12.4 % — ABNORMAL HIGH (ref 3.0–12.0)
Neutro Abs: 6.2 10*3/uL (ref 1.4–7.7)
Neutrophils Relative %: 64.6 % (ref 43.0–77.0)
Platelets: 417 10*3/uL — ABNORMAL HIGH (ref 150.0–400.0)
RBC: 3.76 Mil/uL — ABNORMAL LOW (ref 3.87–5.11)
RDW: 13.9 % (ref 11.5–15.5)
WBC: 9.7 10*3/uL (ref 4.0–10.5)

## 2021-02-10 LAB — HEPATIC FUNCTION PANEL
ALT: 11 U/L (ref 0–35)
AST: 12 U/L (ref 0–37)
Albumin: 3.8 g/dL (ref 3.5–5.2)
Alkaline Phosphatase: 61 U/L (ref 39–117)
Bilirubin, Direct: 0.1 mg/dL (ref 0.0–0.3)
Total Bilirubin: 0.2 mg/dL (ref 0.2–1.2)
Total Protein: 6.9 g/dL (ref 6.0–8.3)

## 2021-02-10 LAB — BASIC METABOLIC PANEL
BUN: 34 mg/dL — ABNORMAL HIGH (ref 6–23)
CO2: 29 mEq/L (ref 19–32)
Calcium: 8.8 mg/dL (ref 8.4–10.5)
Chloride: 101 mEq/L (ref 96–112)
Creatinine, Ser: 1.08 mg/dL (ref 0.40–1.20)
GFR: 50.98 mL/min — ABNORMAL LOW (ref >60.00)
Glucose, Bld: 120 mg/dL — ABNORMAL HIGH (ref 70–99)
Potassium: 4.6 mEq/L (ref 3.5–5.1)
Sodium: 138 mEq/L (ref 135–145)

## 2021-02-10 LAB — FERRITIN: Ferritin: 19.8 ng/mL (ref 10.0–291.0)

## 2021-02-10 LAB — VITAMIN B12: Vitamin B-12: >1550 pg/mL — ABNORMAL HIGH (ref 211–911)

## 2021-02-10 MED ORDER — BUDESONIDE 3 MG PO CPEP: 9.0000 mg | ORAL_CAPSULE | Freq: Every day | ORAL | 3 refills | Status: DC

## 2021-02-10 NOTE — Progress Notes (Signed)
HISTORY OF PRESENT ILLNESS:  Joan [name] is a 75 y.o. female with a history of microscopic colitis diagnosed December 6010 and GERD complicated by peptic stricture.  She was last seen December 03, 2020 with recurrent diarrhea after tapering off budesonide.  She was placed back on budesonide 9 mg daily.  Lomotil as needed.  She follows up at this time.  She is accompanied by her husband.  She reports that approximately 2 to 3 weeks after initiating budesonide, her problems with diarrhea resolved.  Currently having 2 formed bowel movements most days.  Occasionally will have days without a bowel movement.  Or any other complaint is significant fatigue.  They wonder if we might not check some blood work.  She is yet to establish with her new PCP, Dr. Sabra Heck.  REVIEW OF SYSTEMS:  All non-GI ROS negative unless otherwise stated in the HPI except for back pain and arthritis and shortness of breath  Past Medical History:  Diagnosis Date   Alopecia areata 02/16/2006   Anxiety state, unspecified 2004   Benign paroxysmal positional vertigo 04/01/03   Cancer ([place])    skin    Cervicalgia 04/01/03   Chronic rhinitis 09/01/05   Corns and callosities    Dermatophytosis of nail    Diarrhea 04/15/2015   Diverticulosis    Dysphagia, unspecified(787.20) 2004   Enthesopathy of ankle and tarsus, unspecified 03/31/96   External hemorrhoids without mention of complication    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 Lumbo-sacral anterolisthesis     Senile osteoporosis 04/01/03   Sprain of metatarsophalangeal (joint) of foot    Thyrotoxicosis    Thyrotoxicosis  without mention of goiter or other cause, without mention of thyrotoxic crisis or storm    Unspecified vitamin D deficiency    Urinary frequency     Past Surgical History:  Procedure Laterality Date   COLONOSCOPY     COLONOSCOPY N/A 04/15/2015   Procedure: COLONOSCOPY;  Surgeon: Gatha Mayer, MD;  Location: [facility];  Service: Endoscopy;  Laterality: N/A;   DG FINGERS, MULTIPLE LT HAND ([place] HX)     FOOT SURGERY Bilateral 05/22/2014   Dr.Strom    LUMBAR DISC SURGERY  12/12/2017   Dr Arnoldo Morale   REMOVE GANGLION  1998   DR [facility]    RIGHT WRIST SUGERY FOR TENOSYNOVITIS  1997   DR [facility]    TONSILLECTOMY AND ADENOIDECTOMY  1974   TUBAL LIGATION  1973    Social History Joanmarie Cheek [place]  reports that she has never smoked. She has never used smokeless tobacco. She reports that she does not drink alcohol and does not use drugs.  family history includes Heart attack in her father; Heart disease in her father.  Allergies  Allergen Reactions   Arava [Leflunomide] Diarrhea, Nausea Only and Other (See Comments)    Loss appetite   Penicillins Rash and Other (See Comments)    PATIENT HAS HAD A PCN REACTION WITH IMMEDIATE RASH, FACIAL/TONGUE/THROAT SWELLING, SOB, OR LIGHTHEADEDNESS WITH HYPOTENSION:  #  #  YES  #  #  Has patient had a PCN reaction causing severe rash involving  mucus membranes or skin necrosis: no Has patient had a PCN reaction that required hospitalization: no    Methotrexate Derivatives Other (See Comments)    Hair loss   Neurontin [Gabapentin] Nausea And Vomiting       PHYSICAL EXAMINATION: Vital signs: BP 126/60 (BP Location: Left Arm, Patient Position: Sitting, Cuff Size: Normal)   Pulse 92   Ht 4' 6.5" (1.384 m)   Wt 94 lb 6 oz (42.8 kg)   BMI 22.34 kg/m   Constitutional: Frail chronically ill-appearing female, no acute distress Psychiatric: Pleasant, alert and oriented x3, cooperative Eyes: extraocular movements intact, anicteric, conjunctiva  pink Mouth: Mask Neck: supple no lymphadenopathy Cardiovascular: heart regular rate and rhythm, no murmur Lungs: clear to auscultation bilaterally Abdomen: soft, nontender, nondistended, no obvious ascites, no peritoneal signs, normal bowel sounds, no organomegaly Rectal: Omitted Extremities: no clubbing or cyanosis.  No edema lower extremity edema bilaterally arthritic deformities tophi on the elbows Skin: no lesions on visible extremities save ecchymoses Neuro: No focal deficits.  Cranial nerves intact  ASSESSMENT:  1.  Microscopic colitis.  Improved on budesonide 2.  Fatigue 3.  Multiple general medical problems  PLAN:  1.  Continue budesonide 9 mg daily for 1 month.  If doing well, decrease to 6 mg daily for 1 month.  If doing well at that point, decrease to 3 mg daily for 1 month.  If doing well at that point, stop budesonide.  Contact the office for recurrent diarrhea at any point during the tapering process. 2.  Laboratories today regarding issues with fatigue including CBC, comprehensive metabolic panel, TSH, and R83 level 3.  Routine office follow-up in 4 months.

## 2021-02-10 NOTE — Patient Instructions (Signed)
If you are age 74 or older, your body mass index should be between 23-30. Your Body mass index is 22.34 kg/m. If this is out of the aforementioned range listed, please consider follow up with your Primary Care Provider.  If you are age 87 or younger, your body mass index should be between 19-25. Your Body mass index is 22.34 kg/m. If this is out of the aformentioned range listed, please consider follow up with your Primary Care Provider.   __________________________________________________________  The [place] providers would like to encourage you to use [facility] to communicate with providers for non-urgent requests or questions.  Due to long hold times on the telephone, sending your provider a message by [facility] may be a faster and more efficient way to get a response.  Please allow 48 business hours for a response.  Please remember that this is for non-urgent requests.   Your provider has requested that you go to the basement level for lab work before leaving today. Press "B" on the elevator. The lab is located at the first door on the left as you exit the elevator.  We have sent the following medications to your pharmacy for you to pick up at your convenience:  Budesonide.  Take 9mg  a day for one month.  If you are doing better, decrease to 6mg  a day for one month.  If you are doing better you can stop.   Please follow up in 4 months

## 2021-02-15 DIAGNOSIS — E538 Deficiency of other specified B group vitamins: Secondary | ICD-10-CM | POA: Diagnosis not present

## 2021-02-15 DIAGNOSIS — J302 Other seasonal allergic rhinitis: Secondary | ICD-10-CM | POA: Diagnosis not present

## 2021-02-15 DIAGNOSIS — J40 Bronchitis, not specified as acute or chronic: Secondary | ICD-10-CM | POA: Diagnosis not present

## 2021-02-15 DIAGNOSIS — Z6820 Body mass index (BMI) 20.0-20.9, adult: Secondary | ICD-10-CM | POA: Diagnosis not present

## 2021-02-15 DIAGNOSIS — N183 Chronic kidney disease, stage 3 unspecified: Secondary | ICD-10-CM | POA: Diagnosis not present

## 2021-02-15 DIAGNOSIS — J329 Chronic sinusitis, unspecified: Secondary | ICD-10-CM | POA: Diagnosis not present

## 2021-02-16 DIAGNOSIS — M0579 Rheumatoid arthritis with rheumatoid factor of multiple sites without organ or systems involvement: Secondary | ICD-10-CM | POA: Diagnosis not present

## 2021-02-16 DIAGNOSIS — M21241 Flexion deformity, right finger joints: Secondary | ICD-10-CM | POA: Diagnosis not present

## 2021-02-16 DIAGNOSIS — M5136 Other intervertebral disc degeneration, lumbar region: Secondary | ICD-10-CM | POA: Diagnosis not present

## 2021-02-16 DIAGNOSIS — M810 Age-related osteoporosis without current pathological fracture: Secondary | ICD-10-CM | POA: Diagnosis not present

## 2021-02-16 DIAGNOSIS — F418 Other specified anxiety disorders: Secondary | ICD-10-CM | POA: Diagnosis not present

## 2021-02-16 DIAGNOSIS — Z111 Encounter for screening for respiratory tuberculosis: Secondary | ICD-10-CM | POA: Diagnosis not present

## 2021-02-16 DIAGNOSIS — M150 Primary generalized (osteo)arthritis: Secondary | ICD-10-CM | POA: Diagnosis not present

## 2021-02-16 DIAGNOSIS — M7022 Olecranon bursitis, left elbow: Secondary | ICD-10-CM | POA: Diagnosis not present

## 2021-02-16 DIAGNOSIS — N1831 Chronic kidney disease, stage 3a: Secondary | ICD-10-CM | POA: Diagnosis not present

## 2021-02-25 DIAGNOSIS — M542 Cervicalgia: Secondary | ICD-10-CM | POA: Diagnosis not present

## 2021-02-25 DIAGNOSIS — Z6820 Body mass index (BMI) 20.0-20.9, adult: Secondary | ICD-10-CM | POA: Diagnosis not present

## 2021-03-02 DIAGNOSIS — H5213 Myopia, bilateral: Secondary | ICD-10-CM | POA: Diagnosis not present

## 2021-03-02 DIAGNOSIS — H04123 Dry eye syndrome of bilateral lacrimal glands: Secondary | ICD-10-CM | POA: Diagnosis not present

## 2021-03-02 DIAGNOSIS — H353130 Nonexudative age-related macular degeneration, bilateral, stage unspecified: Secondary | ICD-10-CM | POA: Diagnosis not present

## 2021-03-02 DIAGNOSIS — H2513 Age-related nuclear cataract, bilateral: Secondary | ICD-10-CM | POA: Diagnosis not present

## 2021-03-03 ENCOUNTER — Other Ambulatory Visit: Payer: Self-pay | Admitting: Family Medicine

## 2021-03-03 DIAGNOSIS — Z1231 Encounter for screening mammogram for malignant neoplasm of breast: Secondary | ICD-10-CM

## 2021-03-09 ENCOUNTER — Ambulatory Visit: Payer: Medicare PPO | Admitting: Family Medicine

## 2021-03-18 DIAGNOSIS — M069 Rheumatoid arthritis, unspecified: Secondary | ICD-10-CM | POA: Diagnosis not present

## 2021-03-18 DIAGNOSIS — M25822 Other specified joint disorders, left elbow: Secondary | ICD-10-CM | POA: Diagnosis not present

## 2021-03-18 DIAGNOSIS — M13849 Other specified arthritis, unspecified hand: Secondary | ICD-10-CM | POA: Diagnosis not present

## 2021-03-18 DIAGNOSIS — M7022 Olecranon bursitis, left elbow: Secondary | ICD-10-CM | POA: Diagnosis not present

## 2021-03-22 ENCOUNTER — Ambulatory Visit: Payer: Medicare PPO | Admitting: Family Medicine

## 2021-03-22 DIAGNOSIS — M542 Cervicalgia: Secondary | ICD-10-CM | POA: Diagnosis not present

## 2021-03-22 DIAGNOSIS — I10 Essential (primary) hypertension: Secondary | ICD-10-CM | POA: Diagnosis not present

## 2021-03-28 ENCOUNTER — Other Ambulatory Visit: Payer: Self-pay | Admitting: Family Medicine

## 2021-03-29 ENCOUNTER — Ambulatory Visit: Payer: Medicare PPO | Admitting: Family Medicine

## 2021-03-29 DIAGNOSIS — M4802 Spinal stenosis, cervical region: Secondary | ICD-10-CM | POA: Diagnosis not present

## 2021-03-29 DIAGNOSIS — M4182 Other forms of scoliosis, cervical region: Secondary | ICD-10-CM | POA: Diagnosis not present

## 2021-03-29 DIAGNOSIS — M47812 Spondylosis without myelopathy or radiculopathy, cervical region: Secondary | ICD-10-CM | POA: Diagnosis not present

## 2021-03-29 DIAGNOSIS — M50221 Other cervical disc displacement at C4-C5 level: Secondary | ICD-10-CM | POA: Diagnosis not present

## 2021-03-29 DIAGNOSIS — M542 Cervicalgia: Secondary | ICD-10-CM | POA: Diagnosis not present

## 2021-04-06 DIAGNOSIS — M542 Cervicalgia: Secondary | ICD-10-CM | POA: Diagnosis not present

## 2021-04-06 DIAGNOSIS — M546 Pain in thoracic spine: Secondary | ICD-10-CM | POA: Diagnosis not present

## 2021-04-11 DIAGNOSIS — M542 Cervicalgia: Secondary | ICD-10-CM | POA: Diagnosis not present

## 2021-04-11 DIAGNOSIS — M546 Pain in thoracic spine: Secondary | ICD-10-CM | POA: Diagnosis not present

## 2021-04-13 DIAGNOSIS — M546 Pain in thoracic spine: Secondary | ICD-10-CM | POA: Diagnosis not present

## 2021-04-13 DIAGNOSIS — M542 Cervicalgia: Secondary | ICD-10-CM | POA: Diagnosis not present

## 2021-04-18 DIAGNOSIS — M546 Pain in thoracic spine: Secondary | ICD-10-CM | POA: Diagnosis not present

## 2021-04-18 DIAGNOSIS — M542 Cervicalgia: Secondary | ICD-10-CM | POA: Diagnosis not present

## 2021-04-18 DIAGNOSIS — C44722 Squamous cell carcinoma of skin of right lower limb, including hip: Secondary | ICD-10-CM | POA: Diagnosis not present

## 2021-04-18 DIAGNOSIS — L570 Actinic keratosis: Secondary | ICD-10-CM | POA: Diagnosis not present

## 2021-04-18 DIAGNOSIS — L0390 Cellulitis, unspecified: Secondary | ICD-10-CM | POA: Diagnosis not present

## 2021-04-20 DIAGNOSIS — M542 Cervicalgia: Secondary | ICD-10-CM | POA: Diagnosis not present

## 2021-04-20 DIAGNOSIS — M546 Pain in thoracic spine: Secondary | ICD-10-CM | POA: Diagnosis not present

## 2021-04-23 ENCOUNTER — Other Ambulatory Visit: Payer: Self-pay | Admitting: Internal Medicine

## 2021-04-23 DIAGNOSIS — K259 Gastric ulcer, unspecified as acute or chronic, without hemorrhage or perforation: Secondary | ICD-10-CM

## 2021-04-25 DIAGNOSIS — M542 Cervicalgia: Secondary | ICD-10-CM | POA: Diagnosis not present

## 2021-04-25 DIAGNOSIS — M546 Pain in thoracic spine: Secondary | ICD-10-CM | POA: Diagnosis not present

## 2021-04-26 ENCOUNTER — Ambulatory Visit: Payer: Medicare PPO | Admitting: Family Medicine

## 2021-04-26 ENCOUNTER — Encounter: Payer: Self-pay | Admitting: Family Medicine

## 2021-04-26 ENCOUNTER — Other Ambulatory Visit: Payer: Self-pay

## 2021-04-26 VITALS — BP 110/70 | HR 78 | Temp 94.4°F | Ht <= 58 in | Wt 99.0 lb

## 2021-04-26 DIAGNOSIS — M797 Fibromyalgia: Secondary | ICD-10-CM | POA: Diagnosis not present

## 2021-04-26 DIAGNOSIS — I10 Essential (primary) hypertension: Secondary | ICD-10-CM

## 2021-04-26 DIAGNOSIS — M059 Rheumatoid arthritis with rheumatoid factor, unspecified: Secondary | ICD-10-CM

## 2021-04-26 MED ORDER — NAPROXEN 500 MG PO TABS: 500.0000 mg | ORAL_TABLET | Freq: Two times a day (BID) | ORAL | 2 refills | Status: DC

## 2021-04-26 NOTE — Progress Notes (Signed)
Provider:  Alain Honey, MD  Careteam: Patient Care Team: Wardell Honour, MD as PCP - General (Family Medicine) Hennie Duos, MD as Consulting Physician (Rheumatology) Altheimer, Legrand Como, MD as Consulting Physician (Endocrinology) Newman Pies, MD as Consulting Physician (Neurosurgery) Clydell Hakim, MD (Inactive) as Consulting Physician (Anesthesiology) Staci Righter, MD as Consulting Physician (Diagnostic Radiology) Jolene Schimke, MD as Referring Physician (Dermatology) Lynnell Dike, OD as Consulting Physician (Optometry)  PLACE OF SERVICE:  [place] Directive information    Allergies  Allergen Reactions   Arava [Leflunomide] Diarrhea, Nausea Only and Other (See Comments)    Loss appetite   Penicillins Rash and Other (See Comments)    PATIENT HAS HAD A PCN REACTION WITH IMMEDIATE RASH, FACIAL/TONGUE/THROAT SWELLING, SOB, OR LIGHTHEADEDNESS WITH HYPOTENSION:  #  #  YES  #  #  Has patient had a PCN reaction causing severe rash involving mucus membranes or skin necrosis: no Has patient had a PCN reaction that required hospitalization: no    Methotrexate Derivatives Other (See Comments)    Hair loss   Neurontin [Gabapentin] Nausea And Vomiting    Chief Complaint  Patient presents with   Medical Management of Chronic Issues    Patient presents today for a follow-up     HPI: Patient is a 75 y.o. female .  Patient is here to follow-up chronic medical problems including rheumatoid arthritis hypertension, and chronic kidney disease stage III.  She has no specific complaints today.  She tells me that she is scheduled for surgery in October to remove a bursa of her left olecranon.  This has been aspirated twice by orthopedics and rheumatology.  It causes her no pain. Seen by nephrology several months ago.  Her BUN and creatinine are basically normal and GFR is at early stage III.  No specific recommendations were made except to continue  hydration.  Review of Systems:  Review of Systems  Respiratory: Negative.    Cardiovascular: Negative.   Musculoskeletal:  Positive for joint pain.  Neurological: Negative.   Psychiatric/Behavioral: Negative.    All other systems reviewed and are negative.  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 Lumbo-sacral anterolisthesis     Senile osteoporosis 04/01/03   Sprain of metatarsophalangeal (joint) of foot    Thyrotoxicosis    Thyrotoxicosis without mention of goiter or other cause, without mention of thyrotoxic crisis or storm    Unspecified vitamin D deficiency    Urinary frequency    Past Surgical History:  Procedure Laterality Date   COLONOSCOPY     COLONOSCOPY N/A 04/15/2015   Procedure: COLONOSCOPY;  Surgeon: Gatha Mayer, MD;  [facility] END[facility]COPY;  Service: Endoscopy;  Laterality: N/A;   DG FINGERS, MULTIPLE LT HAND ([facility] HX)     FOOT SURGERY Bilateral 05/22/2014   Dr.Strom    LUMBAR DISC SURGERY  12/12/2017   Dr Arnoldo Morale   REMOVE GANGLION  1998   DR [facility]    RIGHT WRIST SUGERY FOR TENOSYNOVITIS  1997   DR [place]    TUBAL LIGATION  1973   Social History:   reports that she has never smoked. She has never used smokeless tobacco. She reports that she does not drink alcohol and does not use drugs.  Family History  Problem Relation Age of Onset   Heart attack Father        age 33   Heart disease Father    Colon cancer Neg Hx    Esophageal cancer Neg Hx    Rectal cancer Neg Hx    Stomach cancer Neg Hx     Medications: Patient's Medications  New Prescriptions   No medications on file  Previous Medications   ACETAMINOPHEN (TYLENOL) 500 MG TABLET    Take 1,000 mg by mouth as needed for mild pain or moderate pain.    AMITRIPTYLINE (ELAVIL) 10 MG TABLET    TAKE 1 TABLET BY MOUTH AT BEDTIME TO  HELP  RELAX  MUSCLES.   BIOTIN 1000 MCG TABLET    Take 1 tablet (1 mg total) by mouth 2 (two) times daily.   BUDESONIDE (ENTOCORT EC) 3 MG 24 HR CAPSULE    Take 3 capsules (9 mg total) by mouth daily.   CALCIUM CARB-CHOLECALCIFEROL (CALTRATE 600+D3) 600-800 MG-UNIT TABS    Take 1 tablet by mouth 2 (two) times daily.   CHOLECALCIFEROL (VITAMIN D3) 50 MCG (2000 UT) CAPSULE    Take 1 capsule (2,000 Units total) by mouth daily.   CHOLESTYRAMINE (QUESTRAN) 4 G PACKET    Take 1 packet (4 g total) by mouth 2 (two) times daily. Do not take within 2 hours of other medications   COLESTIPOL (COLESTID) 1 G TABLET    TAKE 2 TABLETS BY MOUTH TWICE DAILY   CYCLOSPORINE (RESTASIS) 0.05 % OPHTHALMIC EMULSION    Place 1 drop into both eyes 2 (two) times daily.   DENOSUMAB (PROLIA) 60 MG/ML SOSY INJECTION    Inject 60 mg into the skin every 6 (six) months.   DIPHENOXYLATE-ATROPINE (LOMOTIL) 2.5-0.025 MG TABLET    TAKE 1 TO 2 TABLETS BY MOUTH EVERY 8 HOURS AS NEEDED   ETANERCEPT (ENBREL) 50 MG/ML INJECTION    Inject 50 mg into the skin once a week.   FLUTICASONE (FLONASE) 50 MCG/ACT NASAL SPRAY    Place 1 spray into both nostrils daily for 14 days.   FOLIC ACID (FOLVITE) 1 MG TABLET    Take 1 mg by mouth daily.    GLUCOSAMINE-CHONDROIT-VIT C-MN (GLUCOSAMINE CHONDR 500 COMPLEX PO)    Take 2 tablets by mouth daily.    HYDROCHLOROTHIAZIDE (HYDRODIURIL) 25 MG TABLET    Take 1 tablet by mouth once daily   LEVOTHYROXINE SODIUM (TIROSINT) 50 MCG CAPS    Three times per week per endocrine   LOSARTAN (COZAAR) 100 MG TABLET    Take 1 tablet by mouth once daily   MENTHOL, TOPICAL ANALGESIC, (ASPERCREME MAX ROLL-ON EX)    Apply topically.   MULTIPLE VITAMIN (MULTIVITAMIN) TABLET    Take 1 tablet by mouth daily.    NAPROXEN (NAPROSYN)  500 MG TABLET    Take 1 tablet by mouth in the morning and at bedtime.   OMEGA-3 FATTY ACIDS (FISH OIL) 1000 MG CAPS    Take 1,000 mg by mouth daily.    OMEPRAZOLE (PRILOSEC) 40 MG CAPSULE    Take 1 capsule by mouth once daily   PREDNISONE (DELTASONE) 5 MG TABLET    TAKE 1 TABLET BY MOUTH WITH BREAKFAST   PROPYLENE GLYCOL 0.6 % SOLN    Place 1 drop into both eyes 2 (two) times daily as needed (dry eyes).    TRAMADOL (ULTRAM) 50 MG TABLET    Take by mouth 2 (two) times daily as needed.   TRETINOIN (RETIN-A) 0.025 % CREAM    tretinoin 0.025 % topical cream  APPLY TO FACE AT BEDTIME   UREA (CARMOL) 10 % CREAM    Apply topically as needed.   ZINC GLUCONATE 50 MG TABLET    Take by mouth as needed.  Modified Medications   No medications on file  Discontinued Medications   No medications on file    Physical Exam:  Vitals:   04/26/21 0841  BP: 110/70  Pulse: 78  Temp: (!) 94.4 F (34.7 C)  TempSrc: Temporal  SpO2: 97%  Weight: 99 lb (44.9 kg)  Height: 4' 6.5" (1.384 m)   Body mass index is 23.43 kg/m. Wt Readings from Last 3 Encounters:  04/26/21 99 lb (44.9 kg)  02/10/21 94 lb 6 oz (42.8 kg)  12/03/20 98 lb 8 oz (44.7 kg)    Physical Exam Vitals and nursing note reviewed.  Constitutional:      Appearance: Normal appearance.  Cardiovascular:     Rate and Rhythm: Normal rate and regular rhythm.  Pulmonary:     Breath sounds: Normal breath sounds.  Musculoskeletal:      Comments: Large bursa over left olecranon.  Attempted aspiration with only small amount of clear fluid obtained.  It was rather viscous.  Bursa continued to drain with some pressure after needle withdrawn.  Pressure dressing with Coban and 4 x 4's applied.  My expectation is that this will continue to drain some.  Neurological:     Mental Status: She is alert.    Labs reviewed: Basic Metabolic Panel: Recent Labs    08/03/20 0806 02/10/21 1554  NA 141 138  K 3.9 4.6  CL 102 101  CO2 29 29  GLUCOSE 77 120*  BUN 30* 34*  CREATININE 0.97* 1.08  CALCIUM 8.9 8.8  TSH  --  0.99   Liver Function Tests: Recent Labs    02/10/21 1554  AST 12  ALT 11  ALKPHOS 61  BILITOT 0.2  PROT 6.9  ALBUMIN 3.8   No results for input(s): LIPASE, AMYLASE in the last 8760 hours. No results for input(s): AMMONIA in the last 8760 hours. CBC: Recent Labs    08/03/20 0806 02/10/21 1554  WBC 10.8 9.7  NEUTROABS 4,817 6.2  HGB 12.6 11.2*  HCT 39.3 33.4*  MCV 91.6 88.7  PLT 319 417.0*   Lipid Panel: No results for input(s): CHOL, HDL, LDLCALC, TRIG, CHOLHDL, LDLDIRECT in the last 8760 hours. TSH: Recent Labs    02/10/21 1554  TSH 0.99   A1C: Lab Results  Component Value Date   HGBA1C 5.2 11/17/2019     Assessment/Plan  1. Rheumatoid arthritis with positive rheumatoid factor, involving unspecified site [facility]) Patient says that Naprosyn gives her lots of relief from her joint and muscle pain.  She takes 2/day.  Stressed importance of hydration in view of her kidney disease - naproxen (NAPROSYN) 500 MG tablet; Take 1 tablet (500 mg total) by mouth in the morning and at bedtime.  Dispense: 180 tablet; Refill: 2  2. Primary hypertension Blood pressure is good today at 110/70 on losartan  3. Fibromyalgia Unsure if this is a product of her rheumatoid disease.  She complains of pain in her neck and shoulders frequently.  I told her about an OTC cream called fibro-.  She seemed anxious to  give this a try   Alain Honey, MD [place] [phone]

## 2021-04-27 DIAGNOSIS — M546 Pain in thoracic spine: Secondary | ICD-10-CM | POA: Diagnosis not present

## 2021-04-27 DIAGNOSIS — M542 Cervicalgia: Secondary | ICD-10-CM | POA: Diagnosis not present

## 2021-04-29 ENCOUNTER — Ambulatory Visit
Admission: RE | Admit: 2021-04-29 | Discharge: 2021-04-29 | Disposition: A | Discharge status: Home / Self Care | Payer: Medicare PPO | Source: Ambulatory Visit | Attending: Family Medicine | Admitting: Family Medicine

## 2021-04-29 ENCOUNTER — Other Ambulatory Visit: Payer: Self-pay

## 2021-04-29 DIAGNOSIS — Z1231 Encounter for screening mammogram for malignant neoplasm of breast: Secondary | ICD-10-CM | POA: Diagnosis not present

## 2021-05-02 DIAGNOSIS — M542 Cervicalgia: Secondary | ICD-10-CM | POA: Diagnosis not present

## 2021-05-02 DIAGNOSIS — M546 Pain in thoracic spine: Secondary | ICD-10-CM | POA: Diagnosis not present

## 2021-05-10 DIAGNOSIS — D485 Neoplasm of uncertain behavior of skin: Secondary | ICD-10-CM | POA: Diagnosis not present

## 2021-05-10 DIAGNOSIS — L570 Actinic keratosis: Secondary | ICD-10-CM | POA: Diagnosis not present

## 2021-05-11 DIAGNOSIS — M542 Cervicalgia: Secondary | ICD-10-CM | POA: Diagnosis not present

## 2021-05-11 DIAGNOSIS — M546 Pain in thoracic spine: Secondary | ICD-10-CM | POA: Diagnosis not present

## 2021-05-12 DIAGNOSIS — E890 Postprocedural hypothyroidism: Secondary | ICD-10-CM | POA: Diagnosis not present

## 2021-05-13 DIAGNOSIS — E040 Nontoxic diffuse goiter: Secondary | ICD-10-CM | POA: Diagnosis not present

## 2021-05-13 DIAGNOSIS — E890 Postprocedural hypothyroidism: Secondary | ICD-10-CM | POA: Diagnosis not present

## 2021-05-16 DIAGNOSIS — I10 Essential (primary) hypertension: Secondary | ICD-10-CM | POA: Diagnosis not present

## 2021-05-16 DIAGNOSIS — M797 Fibromyalgia: Secondary | ICD-10-CM | POA: Diagnosis not present

## 2021-05-16 DIAGNOSIS — R7989 Other specified abnormal findings of blood chemistry: Secondary | ICD-10-CM | POA: Diagnosis not present

## 2021-05-16 DIAGNOSIS — E039 Hypothyroidism, unspecified: Secondary | ICD-10-CM | POA: Diagnosis not present

## 2021-05-16 DIAGNOSIS — N1831 Chronic kidney disease, stage 3a: Secondary | ICD-10-CM | POA: Diagnosis not present

## 2021-05-16 DIAGNOSIS — M546 Pain in thoracic spine: Secondary | ICD-10-CM | POA: Diagnosis not present

## 2021-05-16 DIAGNOSIS — M542 Cervicalgia: Secondary | ICD-10-CM | POA: Diagnosis not present

## 2021-05-16 DIAGNOSIS — M069 Rheumatoid arthritis, unspecified: Secondary | ICD-10-CM | POA: Diagnosis not present

## 2021-05-24 DIAGNOSIS — R7989 Other specified abnormal findings of blood chemistry: Secondary | ICD-10-CM | POA: Diagnosis not present

## 2021-05-27 DIAGNOSIS — Z681 Body mass index (BMI) 19 or less, adult: Secondary | ICD-10-CM | POA: Diagnosis not present

## 2021-05-27 DIAGNOSIS — M7022 Olecranon bursitis, left elbow: Secondary | ICD-10-CM | POA: Diagnosis not present

## 2021-05-27 DIAGNOSIS — M0579 Rheumatoid arthritis with rheumatoid factor of multiple sites without organ or systems involvement: Secondary | ICD-10-CM | POA: Diagnosis not present

## 2021-05-27 DIAGNOSIS — N1831 Chronic kidney disease, stage 3a: Secondary | ICD-10-CM | POA: Diagnosis not present

## 2021-05-27 DIAGNOSIS — M21241 Flexion deformity, right finger joints: Secondary | ICD-10-CM | POA: Diagnosis not present

## 2021-05-27 DIAGNOSIS — M5136 Other intervertebral disc degeneration, lumbar region: Secondary | ICD-10-CM | POA: Diagnosis not present

## 2021-05-27 DIAGNOSIS — E890 Postprocedural hypothyroidism: Secondary | ICD-10-CM | POA: Diagnosis not present

## 2021-05-27 DIAGNOSIS — M810 Age-related osteoporosis without current pathological fracture: Secondary | ICD-10-CM | POA: Diagnosis not present

## 2021-05-27 DIAGNOSIS — Z111 Encounter for screening for respiratory tuberculosis: Secondary | ICD-10-CM | POA: Diagnosis not present

## 2021-05-27 DIAGNOSIS — M150 Primary generalized (osteo)arthritis: Secondary | ICD-10-CM | POA: Diagnosis not present

## 2021-06-06 DIAGNOSIS — R600 Localized edema: Secondary | ICD-10-CM | POA: Diagnosis not present

## 2021-06-06 DIAGNOSIS — Z6821 Body mass index (BMI) 21.0-21.9, adult: Secondary | ICD-10-CM | POA: Diagnosis not present

## 2021-06-06 DIAGNOSIS — I10 Essential (primary) hypertension: Secondary | ICD-10-CM | POA: Diagnosis not present

## 2021-06-06 DIAGNOSIS — Z131 Encounter for screening for diabetes mellitus: Secondary | ICD-10-CM | POA: Diagnosis not present

## 2021-06-06 DIAGNOSIS — T8189XA Other complications of procedures, not elsewhere classified, initial encounter: Secondary | ICD-10-CM | POA: Diagnosis not present

## 2021-06-06 DIAGNOSIS — N183 Chronic kidney disease, stage 3 unspecified: Secondary | ICD-10-CM | POA: Diagnosis not present

## 2021-06-06 DIAGNOSIS — Z23 Encounter for immunization: Secondary | ICD-10-CM | POA: Diagnosis not present

## 2021-06-07 DIAGNOSIS — L570 Actinic keratosis: Secondary | ICD-10-CM | POA: Diagnosis not present

## 2021-06-07 DIAGNOSIS — L97911 Non-pressure chronic ulcer of unspecified part of right lower leg limited to breakdown of skin: Secondary | ICD-10-CM | POA: Diagnosis not present

## 2021-06-07 DIAGNOSIS — L820 Inflamed seborrheic keratosis: Secondary | ICD-10-CM | POA: Diagnosis not present

## 2021-06-07 DIAGNOSIS — D485 Neoplasm of uncertain behavior of skin: Secondary | ICD-10-CM | POA: Diagnosis not present

## 2021-06-08 ENCOUNTER — Other Ambulatory Visit: Payer: Self-pay | Admitting: Family

## 2021-06-21 ENCOUNTER — Encounter: Payer: Self-pay | Admitting: Family Medicine

## 2021-06-21 ENCOUNTER — Other Ambulatory Visit: Payer: Self-pay

## 2021-06-21 ENCOUNTER — Ambulatory Visit: Payer: Medicare PPO | Admitting: Family Medicine

## 2021-06-21 ENCOUNTER — Other Ambulatory Visit: Payer: Self-pay | Admitting: Family Medicine

## 2021-06-21 VITALS — BP 120/70 | HR 80 | Temp 96.0°F | Ht <= 58 in | Wt 103.2 lb

## 2021-06-21 DIAGNOSIS — M059 Rheumatoid arthritis with rheumatoid factor, unspecified: Secondary | ICD-10-CM | POA: Diagnosis not present

## 2021-06-21 DIAGNOSIS — I10 Essential (primary) hypertension: Secondary | ICD-10-CM

## 2021-06-21 DIAGNOSIS — R609 Edema, unspecified: Secondary | ICD-10-CM

## 2021-06-21 MED ORDER — NAPROXEN 500 MG PO TABS: 500.0000 mg | ORAL_TABLET | Freq: Two times a day (BID) | ORAL | 2 refills | Status: DC

## 2021-06-21 MED ORDER — FUROSEMIDE 20 MG PO TABS: 20.0000 mg | ORAL_TABLET | Freq: Every day | ORAL | 3 refills | Status: DC

## 2021-06-21 NOTE — Progress Notes (Signed)
Provider:  Alain Honey, MD  Careteam: Patient Care Team: Wardell Honour, MD as PCP - General (Family Medicine) Hennie Duos, MD as Consulting Physician (Rheumatology) Altheimer, Legrand Como, MD as Consulting Physician (Endocrinology) Newman Pies, MD as Consulting Physician (Neurosurgery) Clydell Hakim, MD (Inactive) as Consulting Physician (Anesthesiology) Staci Righter, MD as Consulting Physician (Diagnostic Radiology) Jolene Schimke, MD as Referring Physician (Dermatology) Lynnell Dike, OD as Consulting Physician (Optometry)  PLACE OF SERVICE:  [facility] Directive information    Allergies  Allergen Reactions   Arava [Leflunomide] Diarrhea, Nausea Only and Other (See Comments)    Loss appetite   Penicillins Rash and Other (See Comments)    PATIENT HAS HAD A PCN REACTION WITH IMMEDIATE RASH, FACIAL/TONGUE/THROAT SWELLING, SOB, OR LIGHTHEADEDNESS WITH HYPOTENSION:  #  #  YES  #  #  Has patient had a PCN reaction causing severe rash involving mucus membranes or skin necrosis: no Has patient had a PCN reaction that required hospitalization: no    Methotrexate Derivatives Other (See Comments)    Hair loss   Neurontin [Gabapentin] Nausea And Vomiting    Chief Complaint  Patient presents with   Acute Visit    Patient presents today for bilateral leg and ankle swelling for 3 weeks now. She reports taking care of her mother 3 days a week and on her feet a lot. She reports elevating legs some while sleeping. She reports a doctor giving her samples of Farxiga 5 mg to start taking for her swelling.   Quality Metric Gaps    Flu and COVD booster     HPI: Patient is a 75 y.o. female patient presents with dependent edema.  She has seen nephrologist who felt like the edema was probably related to venous stasis disease.  Support stockings and elevation was recommended. She has a significant arthritis and takes both naproxen and prednisone which is likely to  cause some sodium retention and contribute to the problem. Recently saw physician in [place] who started her on Farxiga for edema.  I think we can safely stop that since it has not helped and I am not really sure of the indications.  Review of Systems:  Review of Systems  Cardiovascular:  Positive for leg swelling.  Musculoskeletal:  Positive for joint pain.  All other systems reviewed and are negative.  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 Lumbo-sacral anterolisthesis     Senile osteoporosis 04/01/03   Sprain of metatarsophalangeal (joint) of foot    Thyrotoxicosis    Thyrotoxicosis without mention of goiter or other cause, without mention of thyrotoxic  crisis or storm    Unspecified vitamin D deficiency    Urinary frequency    Past Surgical History:  Procedure Laterality Date   COLONOSCOPY     COLONOSCOPY N/A 04/15/2015   Procedure: COLONOSCOPY;  Surgeon: Gatha Mayer, MD;  Location: [facility];  Service: Endoscopy;  Laterality: N/A;   DG FINGERS, MULTIPLE LT HAND ([facility] HX)     FOOT SURGERY Bilateral 05/22/2014   Dr.Strom    LUMBAR DISC SURGERY  12/12/2017    Dr Arnoldo Morale   REMOVE GANGLION  1998   DR [facility]    RIGHT WRIST SUGERY FOR TENOSYNOVITIS  1997   DR Russell   TUBAL LIGATION  1973   Social History:   reports that she has never smoked. She has never used smokeless tobacco. She reports that she does not drink alcohol and does not use drugs.  Family History  Problem Relation Age of Onset   Heart attack Father        age 40   Heart disease Father    Colon cancer Neg Hx    Esophageal cancer Neg Hx    Rectal cancer Neg Hx    Stomach cancer Neg Hx     Medications: Patient's Medications  New Prescriptions   FUROSEMIDE (LASIX) 20 MG TABLET    Take 1 tablet (20 mg total) by mouth daily.  Previous Medications   ACETAMINOPHEN (TYLENOL) 500 MG TABLET    Take 1,000 mg by mouth as needed for mild pain or moderate pain.    AMITRIPTYLINE (ELAVIL) 10 MG TABLET    TAKE 1 TABLET BY MOUTH AT BEDTIME TO HELP RELAX MUSCLES   BIOTIN 1000 MCG TABLET    Take 1 tablet (1 mg total) by mouth 2 (two) times daily.   BUDESONIDE (ENTOCORT EC) 3 MG 24 HR CAPSULE    Take 3 capsules (9 mg total) by mouth daily.   CALCIUM CARB-CHOLECALCIFEROL (CALTRATE 600+D3) 600-800 MG-UNIT TABS    Take 1 tablet by mouth 2 (two) times daily.   CHOLECALCIFEROL (VITAMIN D3) 50 MCG (2000 UT) CAPSULE    Take 1 capsule (2,000 Units total) by mouth daily.   CHOLESTYRAMINE (QUESTRAN) 4 G PACKET    Take 1 packet (4 g total) by mouth 2 (two) times daily. Do not take within 2 hours of other medications   COLESTIPOL (COLESTID) 1 G TABLET    TAKE 2 TABLETS BY MOUTH TWICE DAILY   CYCLOSPORINE (RESTASIS) 0.05 % OPHTHALMIC EMULSION    Place 1 drop into both eyes 2 (two) times daily.   DENOSUMAB (PROLIA) 60 MG/ML SOSY INJECTION    Inject 60 mg into the skin every 6 (six) months.   DIPHENOXYLATE-ATROPINE (LOMOTIL) 2.5-0.025 MG TABLET    TAKE 1 TO 2 TABLETS BY MOUTH EVERY 8 HOURS AS NEEDED   ETANERCEPT (ENBREL) 50 MG/ML INJECTION    Inject 50 mg into the  skin once a week.   FLUTICASONE (FLONASE) 50 MCG/ACT NASAL SPRAY    Place 1 spray into both nostrils daily for 14 days.   FOLIC ACID (FOLVITE) 1 MG TABLET    Take 1 mg by mouth daily.   GLUCOSAMINE-CHONDROIT-VIT C-MN (GLUCOSAMINE CHONDR 500 COMPLEX PO)    Take 2 tablets by mouth daily.    HYDROCHLOROTHIAZIDE (HYDRODIURIL) 25 MG TABLET    Take 1 tablet by mouth once daily   LEVOTHYROXINE SODIUM (TIROSINT) 50 MCG CAPS    Three times per week per endocrine  LOSARTAN (COZAAR) 100 MG TABLET    Take 1 tablet by mouth once daily   MENTHOL, TOPICAL ANALGESIC, (ASPERCREME MAX ROLL-ON EX)    Apply topically.   MULTIPLE VITAMIN (MULTIVITAMIN) TABLET    Take 1 tablet by mouth daily.    OMEGA-3 FATTY ACIDS (FISH OIL) 1000 MG CAPS    Take 1,000 mg by mouth daily.    OMEPRAZOLE (PRILOSEC) 40 MG CAPSULE    Take 1 capsule by mouth once daily   PREDNISONE (DELTASONE) 5 MG TABLET    TAKE 1 TABLET BY MOUTH WITH BREAKFAST   PROPYLENE GLYCOL 0.6 % SOLN    Place 1 drop into both eyes 2 (two) times daily as needed (dry eyes).    TRAMADOL (ULTRAM) 50 MG TABLET    Take by mouth 2 (two) times daily as needed.   TRETINOIN (RETIN-A) 0.025 % CREAM    tretinoin 0.025 % topical cream  APPLY TO FACE AT BEDTIME   UREA (CARMOL) 10 % CREAM    Apply topically as needed.   ZINC GLUCONATE 50 MG TABLET    Take by mouth as needed.  Modified Medications   Modified Medication Previous Medication   NAPROXEN (NAPROSYN) 500 MG TABLET naproxen (NAPROSYN) 500 MG tablet      Take 1 tablet (500 mg total) by mouth in the morning and at bedtime.    Take 1 tablet (500 mg total) by mouth in the morning and at bedtime.  Discontinued Medications   No medications on file    Physical Exam:  Vitals:   06/21/21 0904  BP: 120/70  Pulse: 80  Temp: (!) 96 F (35.6 C)  SpO2: 96%  Weight: 103 lb 3.2 oz (46.8 kg)  Height: 4' 6.5" (1.384 m)   Body mass index is 24.43 kg/m. Wt Readings from Last 3 Encounters:  06/21/21 103 lb 3.2 oz (46.8  kg)  04/26/21 99 lb (44.9 kg)  02/10/21 94 lb 6 oz (42.8 kg)    Physical Exam Vitals and nursing note reviewed.  Constitutional:      Appearance: Normal appearance.  HENT:     Head: Normocephalic.  Cardiovascular:     Rate and Rhythm: Normal rate and regular rhythm.  Pulmonary:     Effort: Pulmonary effort is normal.     Breath sounds: Normal breath sounds.  Musculoskeletal:     Right lower leg: Edema present.     Left lower leg: Edema present.     Comments: Lower extremity swelling is 1-2+.  By her history will but not be much worse by tonight.  Neurological:     General: No focal deficit present.     Mental Status: She is alert and oriented to person, place, and time.    Labs reviewed: Basic Metabolic Panel: Recent Labs    08/03/20 0806 02/10/21 1554  NA 141 138  K 3.9 4.6  CL 102 101  CO2 29 29  GLUCOSE 77 120*  BUN 30* 34*  CREATININE 0.97* 1.08  CALCIUM 8.9 8.8  TSH  --  0.99   Liver Function Tests: Recent Labs    02/10/21 1554  AST 12  ALT 11  ALKPHOS 61  BILITOT 0.2  PROT 6.9  ALBUMIN 3.8   No results for input(s): LIPASE, AMYLASE in the last 8760 hours. No results for input(s): AMMONIA in the last 8760 hours. CBC: Recent Labs    08/03/20 0806 02/10/21 1554  WBC 10.8 9.7  NEUTROABS 4,817 6.2  HGB 12.6 11.2*  HCT  39.3 33.4*  MCV 91.6 88.7  PLT 319 417.0*   Lipid Panel: No results for input(s): CHOL, HDL, LDLCALC, TRIG, CHOLHDL, LDLDIRECT in the last 8760 hours. TSH: Recent Labs    02/10/21 1554  TSH 0.99   A1C: Lab Results  Component Value Date   HGBA1C 5.2 11/17/2019     Assessment/Plan  1. Rheumatoid arthritis with positive rheumatoid factor, involving unspecified site [facility]) She feels like Naprosyn is really helpful.  Rheumatology has her on several other agents which should be much more effective than the NSAID - naproxen (NAPROSYN) 500 MG tablet; Take 1 tablet (500 mg total) by mouth in the morning and at bedtime.   Dispense: 180 tablet; Refill: 2  2. Edema, unspecified type Hope is that she will use this only 2-3 times per week as needed.  Recommend elevate legs when possible and watch sodium intake - furosemide (LASIX) 20 MG tablet; Take 1 tablet (20 mg total) by mouth daily.  Dispense: 30 tablet; Refill: [place], MD [facility] [phone]

## 2021-06-21 NOTE — Patient Instructions (Addendum)
Stop Wilder Glade May use furosemide as needed, 2-3 times/week Elevate legs when possible

## 2021-07-12 DIAGNOSIS — I10 Essential (primary) hypertension: Secondary | ICD-10-CM | POA: Diagnosis not present

## 2021-07-12 DIAGNOSIS — M542 Cervicalgia: Secondary | ICD-10-CM | POA: Diagnosis not present

## 2021-07-12 DIAGNOSIS — M545 Low back pain, unspecified: Secondary | ICD-10-CM | POA: Diagnosis not present

## 2021-07-13 ENCOUNTER — Telehealth: Payer: Self-pay | Admitting: Cardiology

## 2021-07-13 NOTE — Telephone Encounter (Signed)
Patient states she is returning a call. 

## 2021-07-13 NOTE — Telephone Encounter (Signed)
Pt c/o swelling: STAT is pt has developed SOB within 24 hours  If swelling, where is the swelling located? Both legs  How much weight have you gained and in what time span? Not sure  Have you gained 3 pounds in a day or 5 pounds in a week? Not sure  Do you have a log of your daily weights (if so, list)? No   Are you currently taking a fluid pill? No   Are you currently SOB? Yes   Have you traveled recently? No

## 2021-07-13 NOTE — Telephone Encounter (Signed)
Pt is calling to request an appt with Dr. Marlou Porch or an APP for noted bilateral lower extremity edema over the last 3 months.  Pt states she has had swelling to both lower extremities for the last 3 months.  She states she saw her Neurologist yesterday and he advised that she call the office to obtain an appt with Dr. Marlou Porch for this.  Pt states along with her swelling, she does get sob at times, and does experience dyspnea on exertion.  She denies chest pain, palpitations, pre-syncopal or syncopal episodes.  She states she gets dizzy at times, but associates that with some of her neuro meds she's taking.   Asked the pt if she was taking her current regimen on file, especially her lasix.  Pt states she has not been taking her lasix 20 mg po daily.  Also noted she had HCTZ listed and she confirmed with me that this was discontinued by her Urologist.  Took HCTZ off the pts med list.  Pt confirmed her other meds are current and she is taking.   Pt states she has not been weighing daily, but 3 months ago she states she was 98 lbs and at her OV with Neurology yesterday, she was 102 lbs.  Pt is asking Dr. Marlou Porch for advisement on this matter, and can she get an appt with him for this.   Advised the pt that she should be taking her lasix 20 mg po daily as prescribed.  Advised her that she should start wearing compressions during the day and elevate her extremities at rest.  Advised her to maintain a low sodium diet.  Advised the pt she should be dry weighing herself daily, recording this, and report any abnormal parameters of weight gain of 3 lbs in 24 hr time period or 5 lbs in a week to the office.  Informed the pt that I will route this message to Dr. Marlou Porch and RN to further review, advise, and follow-up with the pt accordingly thereafter. Pt verbalized understanding and agrees with this plan.

## 2021-07-13 NOTE — Telephone Encounter (Signed)
Spoke with pt and advised information has been sent to Dr Marlou Porch for review.  Advised she will be contacted with further instructions once he has.  Advised to make sure to be taking Furosemide 20 mg daily, wearing compression stockings daily, elevating feet/legs above the level of her heart and eating a low NA+ diet.  Pt states understanding but also states she would like to be seen in the office.    Of note - pt recently seen by her PCP Dr Sabra Heck 06/21/2021 -  HPI: Patient is a 75 y.o. female patient presents with dependent edema.  She has seen nephrologist who felt like the edema was probably related to venous stasis disease.  Support stockings and elevation was recommended. She has a significant arthritis and takes both naproxen and prednisone which is likely to cause some sodium retention and contribute to the problem. Recently saw physician in [place] who started her on Farxiga for edema.  I think we can safely stop that since it has not helped and I am not really sure of the indications.  Last seen here 10/2017.  Unsure of MD in [place] who started [place] but PCP d/ced it.

## 2021-07-14 NOTE — Telephone Encounter (Signed)
Patient calling back. She would like to know if she can be seen by anyone soon. She states her father died of a heart attack and is very concerned that she needs to be seen.

## 2021-07-14 NOTE — Telephone Encounter (Signed)
Pt's appt has been rescheduled to 11/11 with Dr Marlou Porch.

## 2021-07-15 ENCOUNTER — Other Ambulatory Visit: Payer: Self-pay

## 2021-07-15 ENCOUNTER — Encounter: Payer: Self-pay | Admitting: Cardiology

## 2021-07-15 ENCOUNTER — Ambulatory Visit: Payer: Medicare PPO | Admitting: Cardiology

## 2021-07-15 VITALS — BP 130/60 | HR 81 | Ht <= 58 in | Wt 101.0 lb

## 2021-07-15 DIAGNOSIS — R0602 Shortness of breath: Secondary | ICD-10-CM | POA: Diagnosis not present

## 2021-07-15 DIAGNOSIS — R609 Edema, unspecified: Secondary | ICD-10-CM

## 2021-07-15 DIAGNOSIS — M069 Rheumatoid arthritis, unspecified: Secondary | ICD-10-CM

## 2021-07-15 DIAGNOSIS — R600 Localized edema: Secondary | ICD-10-CM

## 2021-07-15 MED ORDER — FUROSEMIDE 20 MG PO TABS: 20.0000 mg | ORAL_TABLET | Freq: Every day | ORAL | 3 refills | Status: DC

## 2021-07-15 NOTE — Patient Instructions (Signed)
Medication Instructions:  The current medical regimen is effective;  continue present plan and medications.  *If you need a refill on your cardiac medications before your next appointment, please call your pharmacy*  Testing/Procedures: Your physician has requested that you have an echocardiogram. Echocardiography is a painless test that uses sound waves to create images of your heart. It provides your doctor with information about the size and shape of your heart and how well your heart's chambers and valves are working. This procedure takes approximately one hour. There are no restrictions for this procedure.  Follow-Up: At [facility], you and your health needs are our priority.  As part of our continuing mission to provide you with exceptional heart care, we have created designated Provider Care Teams.  These Care Teams include your primary Cardiologist (physician) and Advanced Practice Providers (APPs -  Physician Assistants and Nurse Practitioners) who all work together to provide you with the care you need, when you need it.  We recommend signing up for the patient portal called "MyChart".  Sign up information is provided on this After Visit Summary.  MyChart is used to connect with patients for Virtual Visits (Telemedicine).  Patients are able to view lab/test results, encounter notes, upcoming appointments, etc.  Non-urgent messages can be sent to your provider as well.   To learn more about what you can do with MyChart, go to [email or web address].    Your next appointment:   6 month(s)  The format for your next appointment:   In Person  Provider:   Robbie Lis, PA-C, Melina Copa, PA-C, Cecilie Kicks, NP, Ermalinda Barrios, PA-C, Christen Bame, NP, or Richardson Dopp, PA-C     {  Thank you for choosing [facility]!!

## 2021-07-15 NOTE — Assessment & Plan Note (Signed)
Quite mild.  With her lower extremity edema, we will check echocardiogram to ensure proper structure and function.

## 2021-07-15 NOTE — Telephone Encounter (Signed)
Pt seen in clinic by Dr Marlou Porch today.  Please see that documentation for more information.

## 2021-07-15 NOTE — Assessment & Plan Note (Signed)
Currently being treated with Enbrel by rheumatology.  Excellent.

## 2021-07-15 NOTE — Assessment & Plan Note (Signed)
With her most recent creatinine being 1.0, I am comfortable with her taking Lasix 20 mg daily for added support of reduction of edema.  We will go ahead and give her refills.  Also, I agree with previous providers in that leg elevation is important.  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  During rest time definitely try to elevate this will help with fluid.  In addition, compression hose would be valuable as well.  I will check an echocardiogram to ensure proper structure and function of her heart.  Want to make sure that her RV is functioning well.

## 2021-07-15 NOTE — Progress Notes (Signed)
Cardiology Office Note:    Date:  07/15/2021   ID:  Joan [name], DOB 07-31-1946, MRN [id number]  PCP:  Wardell Honour, MD   [facility] Providers Cardiologist:  Candee Furbish, MD     Referring MD: Wardell Honour, MD    History of Present Illness:    Joan [name] is a 75 y.o. female here for the evaluation of cardiac function, edema at the request of Dr. Alain Honey.  Her father died of a heart attack and she is very concerned that she needs to be seen.  She has had dependent edema.  Has seen nephrologist who feels like venous stasis is likely the issue.  Support stockings elevation recommended.  She takes naproxen prednisone causing fluid retention as well.  She was started on Farxiga for edema as well.  This was stopped subsequently.  She was also given Lasix 20 mg with no refills.  Her last creatinine in October was 1.0.  She was told at one point that she had failing kidneys, this seems to have resolved.  She had hypertension in the past, was previously started on losartan 100.  She has stopped HCTZ in the past.  Non-smoker.  Her father had myocardial infarction and died at age 67.  Sometimes she will feel palpitations.  Chest pain sometimes feels as though could be fibromyalgia.  Prior EKG shows sinus rhythm left anterior fascicular block nonspecific ST-T wave changes.  I checked a stress test.  On 11/05/2017 nuclear stress test was low risk with normal perfusion.  Edema present -this worries her.  She is worried that her heart function is abnormal.  She has mild shortness of breath, not severe.  No chest pain currently.  No syncope no arrhythmias.  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 Lumbo-sacral anterolisthesis     Senile osteoporosis 04/01/03   Sprain of metatarsophalangeal (joint) of foot    Thyrotoxicosis    Thyrotoxicosis without mention of goiter or other cause, without mention of thyrotoxic crisis or storm    Unspecified vitamin D deficiency    Urinary frequency     Past Surgical History:  Procedure Laterality Date   COLONOSCOPY     COLONOSCOPY N/A 04/15/2015   Procedure: COLONOSCOPY;  Surgeon: Gatha Mayer, MD;  Location: [place];  Service: Endoscopy;  Laterality: N/A;   DG FINGERS, MULTIPLE LT HAND ([facility] HX)     FOOT SURGERY Bilateral 05/22/2014   Dr.Strom    LUMBAR DISC SURGERY  12/12/2017   Dr Arnoldo Morale   REMOVE GANGLION  1998   DR [facility]    RIGHT WRIST SUGERY  FOR TENOSYNOVITIS  1997   DR [facility]   TUBAL LIGATION  1973    Current Medications: Current Meds  Medication Sig   acetaminophen (TYLENOL) 500 MG tablet Take 1,000 mg by mouth as needed for mild pain or moderate pain.    amitriptyline (ELAVIL) 10 MG tablet TAKE 1 TABLET BY MOUTH AT BEDTIME TO HELP RELAX MUSCLES   Biotin 1000 MCG tablet Take 1 tablet (1 mg total) by mouth 2 (two) times daily.   budesonide (ENTOCORT EC) 3 MG 24 hr capsule Take 3 capsules (9 mg total) by mouth daily.   Calcium Carb-Cholecalciferol (CALTRATE 600+D3) 600-800 MG-UNIT TABS Take 1 tablet by mouth 2 (two) times daily.    Cholecalciferol (VITAMIN D3) 50 MCG (2000 UT) capsule Take 1 capsule (2,000 Units total) by mouth daily.   cholestyramine (QUESTRAN) 4 g packet Take 1 packet (4 g total) by mouth 2 (two) times daily. Do not take within 2 hours of other medications   colestipol (COLESTID) 1 g tablet TAKE 2 TABLETS BY MOUTH TWICE DAILY   cycloSPORINE (RESTASIS) 0.05 % ophthalmic emulsion Place 1 drop into both eyes 2 (two) times daily.   denosumab (PROLIA) 60 MG/ML SOSY injection Inject 60 mg into the skin every 6 (six) months.   diphenoxylate-atropine (LOMOTIL) 2.5-0.025 MG tablet TAKE 1 TO 2 TABLETS BY MOUTH EVERY 8 HOURS AS NEEDED   etanercept (ENBREL) 50 MG/ML injection Inject 50 mg into the skin once a week.   fluticasone (FLONASE) 50 MCG/ACT nasal spray Place 1 spray into both nostrils daily for 14 days.   folic acid (FOLVITE) 1 MG tablet Take 1 mg by mouth daily.   Glucosamine-Chondroit-Vit C-Mn (GLUCOSAMINE CHONDR 500 COMPLEX PO) Take 2 tablets by mouth daily.    losartan (COZAAR) 100 MG tablet Take 1 tablet by mouth once daily   Menthol, Topical Analgesic, (ASPERCREME MAX ROLL-ON EX) Apply topically.   Multiple Vitamin (MULTIVITAMIN) tablet Take 1 tablet by mouth daily.    naproxen (NAPROSYN) 500 MG tablet Take 1 tablet (500 mg total) by mouth in the morning and at bedtime.   Omega-3 Fatty Acids (FISH OIL) 1000 MG CAPS Take 1,000 mg by mouth daily.    omeprazole (PRILOSEC) 40 MG capsule Take 1 capsule by mouth once daily   predniSONE (DELTASONE) 5 MG tablet TAKE 1 TABLET BY MOUTH WITH BREAKFAST   Propylene Glycol 0.6 % SOLN Place 1 drop into both eyes 2 (two) times daily as needed (dry eyes).    traMADol (ULTRAM) 50 MG tablet Take by mouth 2 (two) times daily as needed.   tretinoin (RETIN-A) 0.025 % cream tretinoin 0.025 % topical cream  APPLY TO FACE AT BEDTIME   urea (CARMOL) 10 % cream Apply topically as needed.   zinc gluconate 50 MG tablet Take by mouth as needed.   [DISCONTINUED] furosemide  (LASIX) 20 MG tablet Take 1 tablet (20 mg total) by mouth daily.     Allergies:   Arava [leflunomide], Penicillins, Methotrexate derivatives, and Neurontin [gabapentin]   Social History   Socioeconomic History   Marital status: Married    Spouse name: Percell Miller   Number of children: 2   Years of education: Not on file   Highest education level: Not on file  Occupational History   Occupation: retired  Tobacco Use   Smoking status: Never   Smokeless tobacco: Never  Vaping Use   Vaping Use: Never used  Substance and Sexual Activity  Alcohol use: No    Alcohol/week: 0.0 standard drinks   Drug use: No   Sexual activity: Yes  Other Topics Concern   Not on file  Social History Narrative   Not on file   Social Determinants of Health   Financial Resource Strain: Not on file  Food Insecurity: Not on file  Transportation Needs: Not on file  Physical Activity: Not on file  Stress: Not on file  Social Connections: Not on file     Family History: The patient's family history includes Heart attack in her father; Heart disease in her father. There is no history of Colon cancer, Esophageal cancer, Rectal cancer, or Stomach cancer.  ROS:   Please see the history of present illness.    No bleeding.  No strokelike symptoms. All other systems reviewed and are negative.  EKGs/Labs/Other Studies Reviewed:    The following studies were reviewed today: Outside office notes, prior office notes, prior stress test lab work data reviewed.  EKG:  EKG is  ordered today.  The ekg ordered today demonstrates sinus rhythm left anterior fascicular block, nonspecific ST-T wave changes with no other abnormalities, no change from prior.  Recent Labs: 02/10/2021: ALT 11; BUN 34; Creatinine, Ser 1.08; Hemoglobin 11.2; Platelets 417.0; Potassium 4.6; Sodium 138; TSH 0.99  Recent Lipid Panel    Component Value Date/Time   CHOL 172 11/17/2019 0859   TRIG 134 11/17/2019 0859   HDL 71 11/17/2019 0859    CHOLHDL 2.4 11/17/2019 0859   LDLCALC 78 11/17/2019 0859     Risk Assessment/Calculations:          Physical Exam:    VS:  BP 130/60 (BP Location: Left Arm, Patient Position: Sitting, Cuff Size: Normal)   Pulse 81   Ht 4' 6.5" (1.384 m)   Wt 101 lb (45.8 kg)   BMI 23.91 kg/m     Wt Readings from Last 3 Encounters:  07/15/21 101 lb (45.8 kg)  06/21/21 103 lb 3.2 oz (46.8 kg)  04/26/21 99 lb (44.9 kg)     GEN: Short in stature well nourished, well developed in no acute distress HEENT: Normal NECK: No JVD; No carotid bruits LYMPHATICS: No lymphadenopathy CARDIAC: RRR, no murmurs, rubs, gallops RESPIRATORY:  Clear to auscultation without rales, wheezing or rhonchi  ABDOMEN: Soft, non-tender, non-distended MUSCULOSKELETAL: 2+ bilateral lower extremity edema; No deformity  SKIN: Warm and dry NEUROLOGIC:  Alert and oriented x 3 PSYCHIATRIC:  Normal affect   ASSESSMENT:    1. Shortness of breath   2. Edema, unspecified type   3. Lower extremity edema   4. Rheumatoid arthritis, involving unspecified site, unspecified whether rheumatoid factor present ([place])    PLAN:    In order of problems listed above:  Lower extremity edema With her most recent creatinine being 1.0, I am comfortable with her taking Lasix 20 mg daily for added support of reduction of edema.  We will go ahead and give her refills.  Also, I agree with previous providers in that leg elevation is important.  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  During rest time definitely try to elevate this will help with fluid.  In addition, compression hose would be valuable as well.  I will check an echocardiogram to ensure proper structure and function of her heart.  Want to make sure that her RV is functioning well.  Shortness  of breath Quite mild.  With her lower extremity edema, we will check echocardiogram to ensure proper structure and  function.  Rheumatoid arthritis Currently being treated with Enbrel by rheumatology.  Excellent.         Medication Adjustments/Labs and Tests Ordered: Current medicines are reviewed at length with the patient today.  Concerns regarding medicines are outlined above.  Orders Placed This Encounter  Procedures   EKG 12-Lead   ECHOCARDIOGRAM COMPLETE   Meds ordered this encounter  Medications   furosemide (LASIX) 20 MG tablet    Sig: Take 1 tablet (20 mg total) by mouth daily.    Dispense:  90 tablet    Refill:  3    Patient Instructions  Medication Instructions:  The current medical regimen is effective;  continue present plan and medications.  *If you need a refill on your cardiac medications before your next appointment, please call your pharmacy*  Testing/Procedures: Your physician has requested that you have an echocardiogram. Echocardiography is a painless test that uses sound waves to create images of your heart. It provides your doctor with information about the size and shape of your heart and how well your heart's chambers and valves are working. This procedure takes approximately one hour. There are no restrictions for this procedure.  Follow-Up: At [facility], you and your health needs are our priority.  As part of our continuing mission to provide you with exceptional heart care, we have created designated Provider Care Teams.  These Care Teams include your primary Cardiologist (physician) and Advanced Practice Providers (APPs -  Physician Assistants and Nurse Practitioners) who all work together to provide you with the care you need, when you need it.  We recommend signing up for the patient portal called "MyChart".  Sign up information is provided on this After Visit Summary.  MyChart is used to connect with patients for Virtual Visits (Telemedicine).  Patients are able to view lab/test results, encounter notes, upcoming appointments, etc.  Non-urgent messages can  be sent to your provider as well.   To learn more about what you can do with MyChart, go to [email or web address].    Your next appointment:   6 month(s)  The format for your next appointment:   In Person  Provider:   Robbie Lis, PA-C, Melina Copa, PA-C, Cecilie Kicks, NP, Ermalinda Barrios, PA-C, Christen Bame, NP, or Richardson Dopp, PA-C     {  Thank you for choosing [facility]!!     Signed, Candee Furbish, MD  07/15/2021 12:53 PM    [place]

## 2021-08-02 ENCOUNTER — Other Ambulatory Visit: Payer: Self-pay | Admitting: Internal Medicine

## 2021-08-02 DIAGNOSIS — K259 Gastric ulcer, unspecified as acute or chronic, without hemorrhage or perforation: Secondary | ICD-10-CM

## 2021-08-04 DIAGNOSIS — M0579 Rheumatoid arthritis with rheumatoid factor of multiple sites without organ or systems involvement: Secondary | ICD-10-CM | POA: Diagnosis not present

## 2021-08-04 DIAGNOSIS — M810 Age-related osteoporosis without current pathological fracture: Secondary | ICD-10-CM | POA: Diagnosis not present

## 2021-08-05 ENCOUNTER — Other Ambulatory Visit: Payer: Self-pay

## 2021-08-05 ENCOUNTER — Ambulatory Visit (HOSPITAL_COMMUNITY): Payer: Medicare PPO | Attending: Cardiology

## 2021-08-05 DIAGNOSIS — R0602 Shortness of breath: Secondary | ICD-10-CM

## 2021-08-05 DIAGNOSIS — R609 Edema, unspecified: Secondary | ICD-10-CM | POA: Diagnosis not present

## 2021-08-05 LAB — ECHOCARDIOGRAM COMPLETE
Area-P 1/2: 3.74 cm2
MV M vel: 5.76 m/s
MV Peak grad: 132.7 mmHg
Radius: 0.65 cm
S' Lateral: 1.8 cm

## 2021-08-09 ENCOUNTER — Telehealth: Payer: Self-pay | Admitting: Cardiology

## 2021-08-09 DIAGNOSIS — I340 Nonrheumatic mitral (valve) insufficiency: Secondary | ICD-10-CM

## 2021-08-09 DIAGNOSIS — I071 Rheumatic tricuspid insufficiency: Secondary | ICD-10-CM

## 2021-08-09 NOTE — Telephone Encounter (Signed)
Pt aware of echo results and recommendations per Dr. Marlou Porch. Pt aware she will need a repeat echo in one year to monitor her valvular status.  Pt aware I will go ahead and place the order for repeat echo in one year in the system and send a message to our Echo Scheduler to call her back and arrange this appt.  Advised the pt that per Dr. Marlou Porch, he wants her to continue current therapy for LEE.  Pt verbalized understanding and agrees with this plan.

## 2021-08-09 NOTE — Telephone Encounter (Signed)
-----   Message from Jerline Pain, MD sent at 08/06/2021  6:38 AM EST ----- Normal pulm function with mild increased wall thickness, normal right-sided pressures.  Small amount of fluid seen around the heart in the anterior section, benign.  There is moderate mitral valve regurgitation as well as mild to moderate tricuspid valve regurgitation.  Venous pressures appear normal.  Continue with current therapy for lower extremity edema.  Lets check an echocardiogram again in 1 year to monitor her valvular status.

## 2021-08-09 NOTE — Telephone Encounter (Signed)
° ° °  Pt is calling to get echo result °

## 2021-09-23 DIAGNOSIS — M810 Age-related osteoporosis without current pathological fracture: Secondary | ICD-10-CM | POA: Diagnosis not present

## 2021-09-23 DIAGNOSIS — M0579 Rheumatoid arthritis with rheumatoid factor of multiple sites without organ or systems involvement: Secondary | ICD-10-CM | POA: Diagnosis not present

## 2021-09-23 DIAGNOSIS — M7022 Olecranon bursitis, left elbow: Secondary | ICD-10-CM | POA: Diagnosis not present

## 2021-09-23 DIAGNOSIS — M21241 Flexion deformity, right finger joints: Secondary | ICD-10-CM | POA: Diagnosis not present

## 2021-09-23 DIAGNOSIS — M150 Primary generalized (osteo)arthritis: Secondary | ICD-10-CM | POA: Diagnosis not present

## 2021-09-23 DIAGNOSIS — Z111 Encounter for screening for respiratory tuberculosis: Secondary | ICD-10-CM | POA: Diagnosis not present

## 2021-09-23 DIAGNOSIS — N1831 Chronic kidney disease, stage 3a: Secondary | ICD-10-CM | POA: Diagnosis not present

## 2021-09-23 DIAGNOSIS — M5136 Other intervertebral disc degeneration, lumbar region: Secondary | ICD-10-CM | POA: Diagnosis not present

## 2021-09-23 DIAGNOSIS — Z681 Body mass index (BMI) 19 or less, adult: Secondary | ICD-10-CM | POA: Diagnosis not present

## 2021-10-01 DIAGNOSIS — H61001 Unspecified perichondritis of right external ear: Secondary | ICD-10-CM | POA: Diagnosis not present

## 2021-10-01 DIAGNOSIS — L02415 Cutaneous abscess of right lower limb: Secondary | ICD-10-CM | POA: Diagnosis not present

## 2021-10-01 DIAGNOSIS — L02212 Cutaneous abscess of back [any part, except buttock]: Secondary | ICD-10-CM | POA: Diagnosis not present

## 2021-10-17 DIAGNOSIS — H61001 Unspecified perichondritis of right external ear: Secondary | ICD-10-CM | POA: Diagnosis not present

## 2021-10-17 DIAGNOSIS — L02415 Cutaneous abscess of right lower limb: Secondary | ICD-10-CM | POA: Diagnosis not present

## 2021-10-24 ENCOUNTER — Ambulatory Visit: Payer: Medicare PPO | Admitting: Internal Medicine

## 2021-11-01 ENCOUNTER — Encounter: Payer: Self-pay | Admitting: Family Medicine

## 2021-11-01 ENCOUNTER — Ambulatory Visit: Payer: Medicare PPO | Admitting: Family Medicine

## 2021-11-01 ENCOUNTER — Other Ambulatory Visit: Payer: Self-pay

## 2021-11-01 VITALS — BP 128/70 | HR 74 | Temp 96.0°F | Ht <= 58 in | Wt 99.8 lb

## 2021-11-01 DIAGNOSIS — M25512 Pain in left shoulder: Secondary | ICD-10-CM | POA: Diagnosis not present

## 2021-11-01 DIAGNOSIS — M7582 Other shoulder lesions, left shoulder: Secondary | ICD-10-CM

## 2021-11-01 DIAGNOSIS — R600 Localized edema: Secondary | ICD-10-CM

## 2021-11-01 DIAGNOSIS — M778 Other enthesopathies, not elsewhere classified: Secondary | ICD-10-CM

## 2021-11-01 MED ORDER — METHYLPREDNISOLONE ACETATE 80 MG/ML IJ SUSP
80.0000 mg | Freq: Once | INTRAMUSCULAR | Status: AC
  Administered 2021-11-01: 80 mg via INTRA_ARTICULAR

## 2021-11-01 NOTE — Progress Notes (Signed)
Provider:  Alain Honey, MD  Careteam: Patient Care Team: Wardell Honour, MD as PCP - General (Family Medicine) Jerline Pain, MD as PCP - Cardiology (Cardiology) Hennie Duos, MD as Consulting Physician (Rheumatology) Altheimer, Legrand Como, MD as Consulting Physician (Endocrinology) Newman Pies, MD as Consulting Physician (Neurosurgery) Clydell Hakim, MD (Inactive) as Consulting Physician (Anesthesiology) Staci Righter, MD as Consulting Physician (Diagnostic Radiology) Jolene Schimke, MD as Referring Physician (Dermatology) Lynnell Dike, OD as Consulting Physician (Optometry)  PLACE OF SERVICE:  [place] Directive information    Allergies  Allergen Reactions   Arava [Leflunomide] Diarrhea, Nausea Only and Other (See Comments)    Loss appetite   Penicillins Rash and Other (See Comments)    PATIENT HAS HAD A PCN REACTION WITH IMMEDIATE RASH, FACIAL/TONGUE/THROAT SWELLING, SOB, OR LIGHTHEADEDNESS WITH HYPOTENSION:  #  #  YES  #  #  Has patient had a PCN reaction causing severe rash involving mucus membranes or skin necrosis: no Has patient had a PCN reaction that required hospitalization: no    Methotrexate Derivatives Other (See Comments)    Hair loss   Neurontin [Gabapentin] Nausea And Vomiting    Chief Complaint  Patient presents with   Medical Management of Chronic Issues    Patient presents today for a 6 month follow-up.   Quality Metric Gaps    COVID booster, Flu   Acute Visit    She reports left shoulder pain for a few days now.     HPI: Patient is a 76 y.o. female routine follow-up visit.  She continues to see rheumatology.  Has had recent evaluation at cardiology office for dependent edema.  Had echo which showed mildly increased wall thickness and normal right-sided pressures.  Moderate mitral valve regurg with recommendations to repeat in 1 year Complains of left shoulder pain today and requests injection.  She has not had  injection previously in her shoulder.  Also has chronic thickening and swelling of left olecranon bursa probably more related to arthritis.  She request to have this aspirated.  I believe this has been attempted before without success  Review of Systems:  Review of Systems  Constitutional: Negative.   Cardiovascular:  Positive for leg swelling.  Musculoskeletal:  Positive for joint pain.  All other systems reviewed and are negative.  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 Lumbo-sacral anterolisthesis     Senile osteoporosis 04/01/03   Sprain of metatarsophalangeal (joint) of foot    Thyrotoxicosis    Thyrotoxicosis without mention of goiter or other cause, without mention of thyrotoxic crisis or storm  Unspecified vitamin D deficiency    Urinary frequency    Past Surgical History:  Procedure Laterality Date   COLONOSCOPY     COLONOSCOPY N/A 04/15/2015   Procedure: COLONOSCOPY;  Surgeon: Gatha Mayer, MD;  Location: [place];  Service: Endoscopy;  Laterality: N/A;   DG FINGERS, MULTIPLE LT HAND ([facility] HX)     FOOT SURGERY Bilateral 05/22/2014   Dr.Strom    LUMBAR  DISC SURGERY  12/12/2017   Dr Arnoldo Morale   REMOVE GANGLION  1998   DR [facility]    RIGHT WRIST SUGERY FOR TENOSYNOVITIS  1997   DR [facility]   TUBAL LIGATION  1973   Social History:   reports that she has never smoked. She has never used smokeless tobacco. She reports that she does not drink alcohol and does not use drugs.  Family History  Problem Relation Age of Onset   Heart attack Father        age 32   Heart disease Father    Colon cancer Neg Hx    Esophageal cancer Neg Hx    Rectal cancer Neg Hx    Stomach cancer Neg Hx     Medications: Patient's Medications  New Prescriptions   No medications on file  Previous Medications   ACETAMINOPHEN (TYLENOL) 500 MG TABLET    Take 1,000 mg by mouth as needed for mild pain or moderate pain.    AMITRIPTYLINE (ELAVIL) 10 MG TABLET    TAKE 1 TABLET BY MOUTH AT BEDTIME TO HELP RELAX MUSCLES   BIOTIN 1000 MCG TABLET    Take 1 tablet (1 mg total) by mouth 2 (two) times daily.   BUDESONIDE (ENTOCORT EC) 3 MG 24 HR CAPSULE    Take 3 capsules (9 mg total) by mouth daily.   CALCIUM CARB-CHOLECALCIFEROL (CALTRATE 600+D3) 600-800 MG-UNIT TABS    Take 1 tablet by mouth 2 (two) times daily.   CHOLECALCIFEROL (VITAMIN D3) 50 MCG (2000 UT) CAPSULE    Take 1 capsule (2,000 Units total) by mouth daily.   CHOLESTYRAMINE (QUESTRAN) 4 G PACKET    Take 1 packet (4 g total) by mouth 2 (two) times daily. Do not take within 2 hours of other medications   COLESTIPOL (COLESTID) 1 G TABLET    TAKE 2 TABLETS BY MOUTH TWICE DAILY   CYCLOSPORINE (RESTASIS) 0.05 % OPHTHALMIC EMULSION    Place 1 drop into both eyes 2 (two) times daily.   DENOSUMAB (PROLIA) 60 MG/ML SOSY INJECTION    Inject 60 mg into the skin every 6 (six) months.   DIPHENOXYLATE-ATROPINE (LOMOTIL) 2.5-0.025 MG TABLET    TAKE 1 TO 2 TABLETS BY MOUTH EVERY 8 HOURS AS NEEDED   ETANERCEPT (ENBREL) 50 MG/ML INJECTION    Inject 50 mg into the skin once a week.    FLUTICASONE (FLONASE) 50 MCG/ACT NASAL SPRAY    Place 1 spray into both nostrils daily for 14 days.   FOLIC ACID (FOLVITE) 1 MG TABLET    Take 1 mg by mouth daily.   FUROSEMIDE (LASIX) 20 MG TABLET    Take 1 tablet (20 mg total) by mouth daily.   GLUCOSAMINE-CHONDROIT-VIT C-MN (GLUCOSAMINE CHONDR 500 COMPLEX PO)    Take 2 tablets by mouth daily.    LOSARTAN (COZAAR) 100 MG TABLET    Take 1 tablet by mouth once daily   MENTHOL, TOPICAL ANALGESIC, (ASPERCREME MAX ROLL-ON EX)    Apply topically.   MULTIPLE VITAMIN (  MULTIVITAMIN) TABLET    Take 1 tablet by mouth daily.    NAPROXEN (NAPROSYN) 500 MG TABLET    Take 1 tablet (500 mg total) by mouth in the morning and at bedtime.   OMEGA-3 FATTY ACIDS (FISH OIL) 1000 MG CAPS    Take 1,000 mg by mouth daily.    OMEPRAZOLE (PRILOSEC) 40 MG CAPSULE    Take 1 capsule by mouth once daily   PREDNISONE (DELTASONE) 5 MG TABLET    TAKE 1 TABLET BY MOUTH WITH BREAKFAST   PROPYLENE GLYCOL 0.6 % SOLN    Place 1 drop into both eyes 2 (two) times daily as needed (dry eyes).    TRAMADOL (ULTRAM) 50 MG TABLET    Take by mouth 2 (two) times daily as needed.   TRETINOIN (RETIN-A) 0.025 % CREAM    tretinoin 0.025 % topical cream  APPLY TO FACE AT BEDTIME   UREA (CARMOL) 10 % CREAM    Apply topically as needed.   ZINC GLUCONATE 50 MG TABLET    Take by mouth as needed.  Modified Medications   No medications on file  Discontinued Medications   No medications on file    Physical Exam:  Vitals:   11/01/21 0819  BP: 128/70  Pulse: 74  Temp: (!) 96 F (35.6 C)  SpO2: 97%  Weight: 99 lb 12.8 oz (45.3 kg)  Height: 4' 5.6" (1.361 m)   Body mass index is 24.42 kg/m. Wt Readings from Last 3 Encounters:  11/01/21 99 lb 12.8 oz (45.3 kg)  07/15/21 101 lb (45.8 kg)  06/21/21 103 lb 3.2 oz (46.8 kg)    Physical Exam Vitals and nursing note reviewed.  Constitutional:      Appearance: Normal appearance.  Cardiovascular:     Rate and Rhythm: Normal rate.      Pulses: Normal pulses.  Pulmonary:     Effort: Pulmonary effort is normal.  Musculoskeletal:     Comments: Left shoulder: Tenderness over the proximal deltoid tendon and subacromial bursa. This area was prepped with alcohol and injected with Marcaine and Depo-Medrol, 40 mg.  This was well-tolerated  Neurological:     General: No focal deficit present.     Mental Status: She is alert and oriented to person, place, and time.    Labs reviewed: Basic Metabolic Panel: Recent Labs    02/10/21 1554  NA 138  K 4.6  CL 101  CO2 29  GLUCOSE 120*  BUN 34*  CREATININE 1.08  CALCIUM 8.8  TSH 0.99   Liver Function Tests: Recent Labs    02/10/21 1554  AST 12  ALT 11  ALKPHOS 61  BILITOT 0.2  PROT 6.9  ALBUMIN 3.8   No results for input(s): LIPASE, AMYLASE in the last 8760 hours. No results for input(s): AMMONIA in the last 8760 hours. CBC: Recent Labs    02/10/21 1554  WBC 9.7  NEUTROABS 6.2  HGB 11.2*  HCT 33.4*  MCV 88.7  PLT 417.0*   Lipid Panel: No results for input(s): CHOL, HDL, LDLCALC, TRIG, CHOLHDL, LDLDIRECT in the last 8760 hours. TSH: Recent Labs    02/10/21 1554  TSH 0.99   A1C: Lab Results  Component Value Date   HGBA1C 5.2 11/17/2019     Assessment/Plan  1. Left shoulder pain, unspecified chronicity Shoulder injected as above.  Can repeat in 3 to 4 months if effective  2. Lower extremity edema Trace edema this morning.  Continue with Lasix as per cardiology  3. Deltoid tendonitis of left shoulder Injected as described above   Alain Honey, MD [place] [phone]

## 2021-11-07 ENCOUNTER — Encounter: Payer: Self-pay | Admitting: Internal Medicine

## 2021-11-07 ENCOUNTER — Ambulatory Visit: Payer: Medicare PPO | Admitting: Internal Medicine

## 2021-11-07 VITALS — BP 110/70 | HR 78 | Ht <= 58 in | Wt 97.0 lb

## 2021-11-07 DIAGNOSIS — K52839 Microscopic colitis, unspecified: Secondary | ICD-10-CM

## 2021-11-07 DIAGNOSIS — K222 Esophageal obstruction: Secondary | ICD-10-CM | POA: Diagnosis not present

## 2021-11-07 DIAGNOSIS — K219 Gastro-esophageal reflux disease without esophagitis: Secondary | ICD-10-CM

## 2021-11-07 NOTE — Patient Instructions (Signed)
If you are age 75 or older, your body mass index should be between 23-30. Your Body mass index is 23.39 kg/m?Marland Kitchen If this is out of the aforementioned range listed, please consider follow up with your Primary Care Provider. ? ?If you are age 47 or younger, your body mass index should be between 19-25. Your Body mass index is 23.39 kg/m?Marland Kitchen If this is out of the aformentioned range listed, please consider follow up with your Primary Care Provider.  ? ?________________________________________________________ ? ?The [facility] providers would like to encourage you to use [facility] to communicate with providers for non-urgent requests or questions.  Due to long hold times on the telephone, sending your provider a message by [facility] may be a faster and more efficient way to get a response.  Please allow 48 business hours for a response.  Please remember that this is for non-urgent requests.  ?_______________________________________________________ ? ?Please follow up in one year ?

## 2021-11-07 NOTE — Progress Notes (Signed)
HISTORY OF PRESENT ILLNESS: ? ?Joan [name] is a 76 y.o. female with multiple significant medical problems as listed below.  She is followed in this office for: ? ?1.  Microscopic colitis diagnosed December 2020. ?2.  GERD complicated by peptic stricture. ? ?She was last seen in this office February 10, 2021.  At that time she was instructed to taper off budesonide and follow-up in 4 months.  Patient states that she has continued on budesonide 9 mg daily in order to control her problems with diarrhea.  She tells me that she is not using Questran.  She reports 3 bowel movements per day, the majority of which are formed.  No bleeding.  She does mention over the past several weeks that after breakfast, midmorning, she did experience a few episodes of urgency with loose stools.  No abdominal pain.  No new complaints. ?In terms of GERD, she continues on omeprazole.  No active reflux symptoms.  No recurrent dysphagia. ? ?REVIEW OF SYSTEMS: ? ?All non-GI ROS negative unless otherwise stated in the HPI except for arthritis, back pain, ankle swelling ? ?Past Medical History:  ?Diagnosis Date  ? Alopecia areata 02/16/2006  ? Anxiety state, unspecified 2004  ? Benign paroxysmal positional vertigo 04/01/03  ? Cancer [facility])   ? skin   ? Cervicalgia 04/01/03  ? Chronic rhinitis 09/01/05  ? Corns and callosities   ? Dermatophytosis of nail   ? Diarrhea 04/15/2015  ? Diverticulosis   ? Dysphagia, unspecified(787.20) 2004  ? Enthesopathy of ankle and tarsus, unspecified 03/31/96  ? External hemorrhoids without mention of complication   ? Fibroadenosis of breast 04/01/03  ? Fibromyalgia   ? Hypertension 2004  ? Hypothyroidism   ? Impacted cerumen   ? right ear  ? Lumbago   ? Myalgia and myositis, unspecified 03/01/05  ? Nontoxic uninodular goiter   ? Osteoarthrosis, unspecified whether generalized or localized, unspecified site 04/01/03  ? Other left bundle branch block   ? Rheumatoid arthritis(714.0) 10/05/2004  ? lumbar spondylosis.  Lumbo-sacral anterolisthesis    ? Senile osteoporosis 04/01/03  ? Sprain of metatarsophalangeal (joint) of foot   ? Thyrotoxicosis   ? Thyrotoxicosis without mention of goiter or other cause, without mention of thyrotoxic crisis or storm   ? Unspecified vitamin D deficiency   ? Urinary frequency   ? ? ?Past Surgical History:  ?Procedure Laterality Date  ? COLONOSCOPY    ? COLONOSCOPY N/A 04/15/2015  ? Procedure: COLONOSCOPY;  Surgeon: Gatha Mayer, MD;  Location: [facility];  Service: Endoscopy;  Laterality: N/A;  ? DG FINGERS, MULTIPLE LT HAND ([place] HX)    ? FOOT SURGERY Bilateral 05/22/2014  ? Dr.Strom   ? [place] SURGERY  12/12/2017  ? Dr Arnoldo Morale  ? [place]  ? DR [id number]   ? RIGHT WRIST SUGERY FOR TENOSYNOVITIS  1997  ? DR Struthers  ? TUBAL LIGATION  1973  ? ? ?Social History ?Hazelene Cheek Gongora  reports that she has never smoked. She has never used smokeless tobacco. She reports that she does not drink alcohol and does not use drugs. ? ?family history includes Heart attack in her father; Heart disease in her father. ? ?Allergies  ?Allergen Reactions  ? Arava [Leflunomide] Diarrhea, Nausea Only and Other (See Comments)  ?  Loss appetite  ? Penicillins Rash and Other (See Comments)  ?  PATIENT HAS HAD A PCN REACTION WITH IMMEDIATE RASH, FACIAL/TONGUE/THROAT  SWELLING, SOB, OR LIGHTHEADEDNESS WITH HYPOTENSION:  #  #  YES  #  #  ?Has patient had a PCN reaction causing severe rash involving mucus membranes or skin necrosis: no ?Has patient had a PCN reaction that required hospitalization: no ?  ? Methotrexate Derivatives Other (See Comments)  ?  Hair loss  ? Neurontin [Gabapentin] Nausea And Vomiting  ? ? ?  ? ?PHYSICAL EXAMINATION: ?Vital signs: BP 110/70   Pulse 78   Ht '4\' 6"'$  (1.372 m)   Wt 97 lb (44 kg)   BMI 23.39 kg/m?   ?Constitutional: generally well-appearing, no acute distress ?Psychiatric: alert and oriented x3, cooperative ?Eyes: extraocular  movements intact, anicteric, conjunctiva pink ?Mouth: oral pharynx moist, no lesions ?Neck: supple no lymphadenopathy ?Cardiovascular: heart regular rate and rhythm, no murmur ?Lungs: clear to auscultation bilaterally ?Abdomen: soft, nontender, nondistended, no obvious ascites, no peritoneal signs, normal bowel sounds, no organomegaly ?Rectal: Omitted ?Extremities: no clubbing or cyanosis.  Trace lower extremity edema bilaterally.  Deforming arthritis ?Skin: no lesions on visible extremities except for healing surgical wound (skin cancer) right lower extremity above the ankle ?Neuro: No focal deficits.  Cranial nerves intact ? ?ASSESSMENT: ? ?1.  Microscopic colitis.  On budesonide 9 mg daily ?2.  GERD complicated by peptic stricture.  Asymptomatic on PPI ?3.  Multiple medical problems ? ? ?PLAN: ? ?1.  Try to taper budesonide ?2.  Refill budesonide as needed.  Medication risks reviewed ?3.  Refill omeprazole.  Medication risks reviewed ?4.  Routine office follow-up 1 year.  Sooner if needed ? ? ? ? ?  ?

## 2021-11-10 DIAGNOSIS — E890 Postprocedural hypothyroidism: Secondary | ICD-10-CM | POA: Diagnosis not present

## 2021-11-15 DIAGNOSIS — E890 Postprocedural hypothyroidism: Secondary | ICD-10-CM | POA: Diagnosis not present

## 2021-11-15 DIAGNOSIS — Z79890 Hormone replacement therapy (postmenopausal): Secondary | ICD-10-CM | POA: Diagnosis not present

## 2021-11-21 ENCOUNTER — Encounter: Payer: Medicare PPO | Admitting: Nurse Practitioner

## 2021-11-22 ENCOUNTER — Ambulatory Visit (INDEPENDENT_AMBULATORY_CARE_PROVIDER_SITE_OTHER): Payer: Medicare PPO | Admitting: Nurse Practitioner

## 2021-11-22 ENCOUNTER — Telehealth: Payer: Self-pay

## 2021-11-22 ENCOUNTER — Encounter: Payer: Self-pay | Admitting: Nurse Practitioner

## 2021-11-22 ENCOUNTER — Other Ambulatory Visit: Payer: Self-pay

## 2021-11-22 DIAGNOSIS — Z0000 Encounter for general adult medical examination without abnormal findings: Secondary | ICD-10-CM | POA: Diagnosis not present

## 2021-11-22 DIAGNOSIS — E2839 Other primary ovarian failure: Secondary | ICD-10-CM

## 2021-11-22 NOTE — Progress Notes (Signed)
? ?Subjective:  ? Joan [name] is a 76 y.o. female who presents for Medicare Annual (Subsequent) preventive examination. ? ?Review of Systems    ? ?  ? ?   ?Objective:  ?  ?There were no vitals filed for this visit. ?There is no height or weight on file to calculate BMI. ? ?Advanced Directives 11/22/2021 11/16/2020 08/09/2020 03/29/2020 11/24/2019 11/05/2018 11/05/2018  ?Does Patient Have a Medical Advance Directive? No No No Yes No No No  ?Type of Advance Directive - - - - - - -  ?Copy of Powers in Chart? - - - - - - -  ?Would patient like information on creating a medical advance directive? - No - Patient declined No - Patient declined - Yes (ED - Information included in AVS) No - Patient declined (No Data)  ? ? ?Current Medications (verified) ?Outpatient Encounter Medications as of 11/22/2021  ?Medication Sig  ? acetaminophen (TYLENOL) 650 MG CR tablet Take 1,300 mg by mouth at bedtime.  ? amitriptyline (ELAVIL) 10 MG tablet TAKE 1 TABLET BY MOUTH AT BEDTIME TO HELP RELAX MUSCLES  ? Biotin 1000 MCG tablet Take 1 tablet (1 mg total) by mouth 2 (two) times daily. (Patient taking differently: Take 5,000 mcg by mouth daily.)  ? budesonide (ENTOCORT EC) 3 MG 24 hr capsule Take 3 capsules (9 mg total) by mouth daily.  ? Calcium Carb-Cholecalciferol (CALTRATE 600+D3) 600-800 MG-UNIT TABS Take 1 tablet by mouth 2 (two) times daily.  ? Cholecalciferol (VITAMIN D3) 50 MCG (2000 UT) capsule Take 1 capsule (2,000 Units total) by mouth daily.  ? cycloSPORINE (RESTASIS) 0.05 % ophthalmic emulsion Place 1 drop into both eyes 2 (two) times daily.  ? denosumab (PROLIA) 60 MG/ML SOSY injection Inject 60 mg into the skin every 6 (six) months.  ? diphenoxylate-atropine (LOMOTIL) 2.5-0.025 MG tablet TAKE 1 TO 2 TABLETS BY MOUTH EVERY 8 HOURS AS NEEDED  ? etanercept (ENBREL) 50 MG/ML injection Inject 50 mg into the skin once a week.  ? fluticasone (FLONASE) 50 MCG/ACT nasal spray Place 1 spray into both nostrils  daily for 14 days. (Patient taking differently: Place 1 spray into both nostrils daily as needed.)  ? folic acid (FOLVITE) 1 MG tablet Take 1 mg by mouth daily.  ? furosemide (LASIX) 20 MG tablet Take 1 tablet (20 mg total) by mouth daily.  ? Glucosamine-Chondroit-Vit C-Mn (GLUCOSAMINE CHONDR 500 COMPLEX PO) Take 2 tablets by mouth daily.   ? losartan (COZAAR) 100 MG tablet Take 1 tablet by mouth once daily  ? Menthol, Topical Analgesic, (ASPERCREME MAX ROLL-ON EX) Apply topically.  ? Multiple Vitamin (MULTIVITAMIN) tablet Take 1 tablet by mouth daily.   ? naproxen (NAPROSYN) 500 MG tablet Take 1 tablet (500 mg total) by mouth in the morning and at bedtime.  ? Omega-3 Fatty Acids (FISH OIL) 1000 MG CAPS Take 1,000 mg by mouth daily.   ? omeprazole (PRILOSEC) 40 MG capsule Take 1 capsule by mouth once daily  ? predniSONE (DELTASONE) 5 MG tablet TAKE 1 TABLET BY MOUTH WITH BREAKFAST  ? Propylene Glycol 0.6 % SOLN Place 1 drop into both eyes 2 (two) times daily as needed (dry eyes).   ? traMADol (ULTRAM) 50 MG tablet Take by mouth 2 (two) times daily as needed.  ? tretinoin (RETIN-A) 0.025 % cream tretinoin 0.025 % topical cream ? APPLY TO FACE AT BEDTIME  ? urea (CARMOL) 10 % cream Apply topically as needed.  ? zinc gluconate 50 MG  tablet Take by mouth as needed.  ? cholestyramine (QUESTRAN) 4 g packet Take 1 packet (4 g total) by mouth 2 (two) times daily. Do not take within 2 hours of other medications (Patient not taking: Reported on 11/07/2021)  ? ?No facility-administered encounter medications on file as of 11/22/2021.  ? ? ?Allergies (verified) ?Arava [leflunomide], Penicillins, Methotrexate, Penicillamine, Methotrexate derivatives, and Neurontin [gabapentin]  ? ?History: ?Past Medical History:  ?Diagnosis Date  ? Alopecia areata 02/16/2006  ? Anxiety state, unspecified 2004  ? Benign paroxysmal positional vertigo 04/01/03  ? Cancer [facility])   ? skin   ? Cervicalgia 04/01/03  ? Chronic rhinitis 09/01/05  ? Corns and  callosities   ? Dermatophytosis of nail   ? Diarrhea 04/15/2015  ? Diverticulosis   ? Dysphagia, unspecified(787.20) 2004  ? Enthesopathy of ankle and tarsus, unspecified 03/31/96  ? External hemorrhoids without mention of complication   ? Fibroadenosis of breast 04/01/03  ? Fibromyalgia   ? Hypertension 2004  ? Hypothyroidism   ? Impacted cerumen   ? right ear  ? Lumbago   ? Myalgia and myositis, unspecified 03/01/05  ? Nontoxic uninodular goiter   ? Osteoarthrosis, unspecified whether generalized or localized, unspecified site 04/01/03  ? Other left bundle branch block   ? Rheumatoid arthritis(714.0) 10/05/2004  ? lumbar spondylosis. Lumbo-sacral anterolisthesis    ? Senile osteoporosis 04/01/03  ? Sprain of metatarsophalangeal (joint) of foot   ? Thyrotoxicosis   ? Thyrotoxicosis without mention of goiter or other cause, without mention of thyrotoxic crisis or storm   ? Unspecified vitamin D deficiency   ? Urinary frequency   ? ?Past Surgical History:  ?Procedure Laterality Date  ? COLONOSCOPY    ? COLONOSCOPY N/A 04/15/2015  ? Procedure: COLONOSCOPY;  Surgeon: Gatha Mayer, MD;  Location: [facility];  Service: Endoscopy;  Laterality: N/A;  ? DG FINGERS, MULTIPLE LT HAND ([place] HX)    ? FOOT SURGERY Bilateral 05/22/2014  ? Dr.Strom   ? [place] SURGERY  12/12/2017  ? Dr Arnoldo Morale  ? [place]  ? DR WCHENI   ? RIGHT WRIST SUGERY FOR TENOSYNOVITIS  1997  ? DR [place]  ? TUBAL LIGATION  1973  ? ?Family History  ?Problem Relation Age of Onset  ? Heart attack Father   ?     age 15  ? Heart disease Father   ? Colon cancer Neg Hx   ? Esophageal cancer Neg Hx   ? Rectal cancer Neg Hx   ? Stomach cancer Neg Hx   ? ?Social History  ? ?Socioeconomic History  ? Marital status: Married  ?  Spouse name: Percell Miller  ? Number of children: 2  ? Years of education: Not on file  ? Highest education level: Not on file  ?Occupational History  ? Occupation: retired  ?Tobacco Use  ? Smoking  status: Never  ? Smokeless tobacco: Never  ?Vaping Use  ? Vaping Use: Never used  ?Substance and Sexual Activity  ? Alcohol use: No  ?  Alcohol/week: 0.0 standard drinks  ? Drug use: No  ? Sexual activity: Yes  ?Other Topics Concern  ? Not on file  ?Social History Narrative  ? Not on file  ? ?Social Determinants of Health  ? ?Financial Resource Strain: Not on file  ?Food Insecurity: Not on file  ?Transportation Needs: Not on file  ?Physical Activity: Not on file  ?Stress: Not on file  ?  Social Connections: Not on file  ? ? ?Tobacco Counseling ?Counseling given: Not Answered ? ? ?Clinical Intake: ? ?  ? ?  ? ?  ? ?  ? ?  ? ?Diabetic?no ? ?  ? ?  ? ? ?Activities of Daily Living ?No flowsheet data found. ? ?Patient Care Team: ?Wardell Honour, MD as PCP - General (Family Medicine) ?Jerline Pain, MD as PCP - Cardiology (Cardiology) ?Hennie Duos, MD as Consulting Physician (Rheumatology) ?Altheimer, Legrand Como, MD as Consulting Physician (Endocrinology) ?Newman Pies, MD as Consulting Physician (Neurosurgery) ?Clydell Hakim, MD (Inactive) as Consulting Physician (Anesthesiology) ?Staci Righter, MD as Consulting Physician (Diagnostic Radiology) ?Jolene Schimke, MD as Referring Physician (Dermatology) ?Lynnell Dike, OD as Consulting Physician (Optometry) ? ?Indicate any recent Medical Services you may have received from other than [facility] providers in the past year (date may be approximate). ? ?   ?Assessment:  ? This is a routine wellness examination for Christopher. ? ?Hearing/Vision screen ?Hearing Screening - Comments:: Patient has no hearing problems. ?Vision Screening - Comments:: Patient has no vision problems. Patient has had eye exam within past year. Patient sees Dr. Renaldo Fiddler ? ?Dietary issues and exercise activities discussed: ?  ? ? Goals Addressed   ?None ?  ? ?Depression Screen ?PHQ 2/9 Scores 11/22/2021 11/16/2020 08/09/2020 03/29/2020 11/24/2019 11/11/2019 11/05/2018  ?PHQ - 2 Score 0 0 0 0 0 0 0  ?  ?Fall  Risk ?Fall Risk  11/22/2021 11/01/2021 04/26/2021 11/16/2020 08/09/2020  ?Falls in the past year? 0 0 0 0 0  ?Number falls in past yr: 0 0 0 0 0  ?Comment - - - - -  ?Injury with Fall? 0 0 0 0 0  ?Risk for fall due to

## 2021-11-22 NOTE — Telephone Encounter (Signed)
Joan [name], Joan [name] are scheduled for a virtual visit with your provider today.   ? ?Just as we do with appointments in the office, we must obtain your consent to participate.  Your consent will be active for this visit and any virtual visit you may have with one of our providers in the next 365 days.   ? ?If you have a MyChart account, I can also send a copy of this consent to you electronically.  All virtual visits are billed to your insurance company just like a traditional visit in the office.  As this is a virtual visit, video technology does not allow for your provider to perform a traditional examination.  This may limit your provider's ability to fully assess your condition.  If your provider identifies any concerns that need to be evaluated in person or the need to arrange testing such as labs, EKG, etc, we will make arrangements to do so.   ? ?Although advances in technology are sophisticated, we cannot ensure that it will always work on either your end or our end.  If the connection with a video visit is poor, we may have to switch to a telephone visit.  With either a video or telephone visit, we are not always able to ensure that we have a secure connection.   I need to obtain your verbal consent now.   Are you willing to proceed with your visit today?  ? ?Joan [name] has provided verbal consent on 11/22/2021 for a virtual visit (video or telephone). ? ? ?Carroll Kinds, CMA ?11/22/2021  8:52 AM ?  ?

## 2021-11-22 NOTE — Patient Instructions (Signed)
Joan [name] , ?Thank you for taking time to come for your Medicare Wellness Visit. I appreciate your ongoing commitment to your health goals. Please review the following plan we discussed and let me know if I can assist you in the future.  ? ?Screening recommendations/referrals: ?Colonoscopy up to date ?Mammogram up to date ?Bone Density ordered today ?Recommended yearly ophthalmology/optometry visit for glaucoma screening and checkup ?Recommended yearly dental visit for hygiene and checkup ? ?Vaccinations: ?Influenza vaccine up to date ?Pneumococcal vaccine up to date ?Tdap vaccine up to date ?Shingles vaccine up to date   ? ?Advanced directives: recommended to complete living will and place on file ? ?Conditions/risks identified: advance age ? ?Next appointment: yearly  ? ? ?Preventive Care 75 Years and Older, Female ?Preventive care refers to lifestyle choices and visits with your health care provider that can promote health and wellness. ?What does preventive care include? ?A yearly physical exam. This is also called an annual well check. ?Dental exams once or twice a year. ?Routine eye exams. Ask your health care provider how often you should have your eyes checked. ?Personal lifestyle choices, including: ?Daily care of your teeth and gums. ?Regular physical activity. ?Eating a healthy diet. ?Avoiding tobacco and drug use. ?Limiting alcohol use. ?Practicing safe sex. ?Taking low-dose aspirin every day. ?Taking vitamin and mineral supplements as recommended by your health care provider. ?What happens during an annual well check? ?The services and screenings done by your health care provider during your annual well check will depend on your age, overall health, lifestyle risk factors, and family history of disease. ?Counseling  ?Your health care provider may ask you questions about your: ?Alcohol use. ?Tobacco use. ?Drug use. ?Emotional well-being. ?Home and relationship well-being. ?Sexual activity. ?Eating  habits. ?History of falls. ?Memory and ability to understand (cognition). ?Work and work Statistician. ?Reproductive health. ?Screening  ?You may have the following tests or measurements: ?Height, weight, and BMI. ?Blood pressure. ?Lipid and cholesterol levels. These may be checked every 5 years, or more frequently if you are over 42 years old. ?Skin check. ?Lung cancer screening. You may have this screening every year starting at age 34 if you have a 30-pack-year history of smoking and currently smoke or have quit within the past 15 years. ?Fecal occult blood test (FOBT) of the stool. You may have this test every year starting at age 84. ?Flexible sigmoidoscopy or colonoscopy. You may have a sigmoidoscopy every 5 years or a colonoscopy every 10 years starting at age 58. ?Hepatitis C blood test. ?Hepatitis B blood test. ?Sexually transmitted disease (STD) testing. ?Diabetes screening. This is done by checking your blood sugar (glucose) after you have not eaten for a while (fasting). You may have this done every 1-3 years. ?Bone density scan. This is done to screen for osteoporosis. You may have this done starting at age 1. ?Mammogram. This may be done every 1-2 years. Talk to your health care provider about how often you should have regular mammograms. ?Talk with your health care provider about your test results, treatment options, and if necessary, the need for more tests. ?Vaccines  ?Your health care provider may recommend certain vaccines, such as: ?Influenza vaccine. This is recommended every year. ?Tetanus, diphtheria, and acellular pertussis (Tdap, Td) vaccine. You may need a Td booster every 10 years. ?Zoster vaccine. You may need this after age 20. ?Pneumococcal 13-valent conjugate (PCV13) vaccine. One dose is recommended after age 20. ?Pneumococcal polysaccharide (PPSV23) vaccine. One dose is recommended after age 21. ?  Talk to your health care provider about which screenings and vaccines you need and how  often you need them. ?This information is not intended to replace advice given to you by your health care provider. Make sure you discuss any questions you have with your health care provider. ?Document Released: 09/17/2015 Document Revised: 05/10/2016 Document Reviewed: 06/22/2015 ?Elsevier Interactive Patient Education ? 2017 [place]. ? ?Fall Prevention in the Home ?Falls can cause injuries. They can happen to people of all ages. There are many things you can do to make your home safe and to help prevent falls. ?What can I do on the outside of my home? ?Regularly fix the edges of walkways and driveways and fix any cracks. ?Remove anything that might make you trip as you walk through a door, such as a raised step or threshold. ?Trim any bushes or trees on the path to your home. ?Use bright outdoor lighting. ?Clear any walking paths of anything that might make someone trip, such as rocks or tools. ?Regularly check to see if handrails are loose or broken. Make sure that both sides of any steps have handrails. ?Any raised decks and porches should have guardrails on the edges. ?Have any leaves, snow, or ice cleared regularly. ?Use sand or salt on walking paths during winter. ?Clean up any spills in your garage right away. This includes oil or grease spills. ?What can I do in the bathroom? ?Use night lights. ?Install grab bars by the toilet and in the tub and shower. Do not use towel bars as grab bars. ?Use non-skid mats or decals in the tub or shower. ?If you need to sit down in the shower, use a plastic, non-slip stool. ?Keep the floor dry. Clean up any water that spills on the floor as soon as it happens. ?Remove soap buildup in the tub or shower regularly. ?Attach bath mats securely with double-sided non-slip rug tape. ?Do not have throw rugs and other things on the floor that can make you trip. ?What can I do in the bedroom? ?Use night lights. ?Make sure that you have a light by your bed that is easy to  reach. ?Do not use any sheets or blankets that are too big for your bed. They should not hang down onto the floor. ?Have a firm chair that has side arms. You can use this for support while you get dressed. ?Do not have throw rugs and other things on the floor that can make you trip. ?What can I do in the kitchen? ?Clean up any spills right away. ?Avoid walking on wet floors. ?Keep items that you use a lot in easy-to-reach places. ?If you need to reach something above you, use a strong step stool that has a grab bar. ?Keep electrical cords out of the way. ?Do not use floor polish or wax that makes floors slippery. If you must use wax, use non-skid floor wax. ?Do not have throw rugs and other things on the floor that can make you trip. ?What can I do with my stairs? ?Do not leave any items on the stairs. ?Make sure that there are handrails on both sides of the stairs and use them. Fix handrails that are broken or loose. Make sure that handrails are as long as the stairways. ?Check any carpeting to make sure that it is firmly attached to the stairs. Fix any carpet that is loose or worn. ?Avoid having throw rugs at the top or bottom of the stairs. If you do have throw  rugs, attach them to the floor with carpet tape. ?Make sure that you have a light switch at the top of the stairs and the bottom of the stairs. If you do not have them, ask someone to add them for you. ?What else can I do to help prevent falls? ?Wear shoes that: ?Do not have high heels. ?Have rubber bottoms. ?Are comfortable and fit you well. ?Are closed at the toe. Do not wear sandals. ?If you use a stepladder: ?Make sure that it is fully opened. Do not climb a closed stepladder. ?Make sure that both sides of the stepladder are locked into place. ?Ask someone to hold it for you, if possible. ?Clearly mark and make sure that you can see: ?Any grab bars or handrails. ?First and last steps. ?Where the edge of each step is. ?Use tools that help you move  around (mobility aids) if they are needed. These include: ?Canes. ?Walkers. ?Scooters. ?Crutches. ?Turn on the lights when you go into a dark area. Replace any light bulbs as soon as they burn out. ?Set up your furnitu

## 2021-11-22 NOTE — Progress Notes (Signed)
This service is provided via telemedicine ? ?No vital signs collected/recorded due to the encounter was a telemedicine visit.  ? ?Location of patient (ex: home, work):  Home ? ?Patient consents to a telephone visit:  Yes, see encounter dated 11/22/2021 ? ?Location of the provider (ex: office, home):  [place] ? ?Name of any referring provider:  Wardell Honour., MD ? ?Names of all persons participating in the telemedicine service and their role in the encounter:  Sherrie Mustache, Nurse Practitioner, Carroll Kinds, CMA, and patient.  ? ?Time spent on call:  11 minutes with medical assistant ? ?

## 2021-11-28 DIAGNOSIS — B351 Tinea unguium: Secondary | ICD-10-CM | POA: Diagnosis not present

## 2021-11-28 DIAGNOSIS — L97911 Non-pressure chronic ulcer of unspecified part of right lower leg limited to breakdown of skin: Secondary | ICD-10-CM | POA: Diagnosis not present

## 2021-11-28 DIAGNOSIS — L578 Other skin changes due to chronic exposure to nonionizing radiation: Secondary | ICD-10-CM | POA: Diagnosis not present

## 2021-12-02 ENCOUNTER — Telehealth: Payer: Self-pay

## 2021-12-02 DIAGNOSIS — K52831 Collagenous colitis: Secondary | ICD-10-CM

## 2021-12-02 MED ORDER — BUDESONIDE 3 MG PO CPEP: 9.0000 mg | ORAL_CAPSULE | Freq: Every day | ORAL | 3 refills | Status: DC

## 2021-12-02 NOTE — Telephone Encounter (Signed)
Budesonide approved - resent the rx with approval information ?

## 2021-12-06 ENCOUNTER — Other Ambulatory Visit: Payer: Self-pay | Admitting: Family Medicine

## 2021-12-06 DIAGNOSIS — H6121 Impacted cerumen, right ear: Secondary | ICD-10-CM | POA: Diagnosis not present

## 2021-12-06 DIAGNOSIS — H6690 Otitis media, unspecified, unspecified ear: Secondary | ICD-10-CM | POA: Diagnosis not present

## 2021-12-20 ENCOUNTER — Telehealth: Payer: Self-pay

## 2021-12-20 NOTE — Telephone Encounter (Signed)
Budesonide approved by [facility] ?

## 2022-01-17 ENCOUNTER — Ambulatory Visit: Payer: Medicare PPO | Admitting: Cardiology

## 2022-01-17 ENCOUNTER — Encounter: Payer: Self-pay | Admitting: Cardiology

## 2022-01-17 DIAGNOSIS — R600 Localized edema: Secondary | ICD-10-CM | POA: Diagnosis not present

## 2022-01-17 DIAGNOSIS — I340 Nonrheumatic mitral (valve) insufficiency: Secondary | ICD-10-CM

## 2022-01-17 DIAGNOSIS — I10 Essential (primary) hypertension: Secondary | ICD-10-CM

## 2022-01-17 DIAGNOSIS — Z01810 Encounter for preprocedural cardiovascular examination: Secondary | ICD-10-CM | POA: Diagnosis not present

## 2022-01-17 NOTE — Assessment & Plan Note (Signed)
2019 nuclear stress test was low risk with no ischemia.  Recent echocardiogram shows normal pump function.  If she must proceed with a left elbow surgery she can from a cardiac perspective with low overall cardiac risk. ?

## 2022-01-17 NOTE — Patient Instructions (Signed)

## 2022-01-17 NOTE — Assessment & Plan Note (Signed)
Seen on echocardiogram 2022.  Described as moderate.  Mild murmur heard on exam today.  We will continue to monitor clinically.  May wish to check another echocardiogram next year. ?

## 2022-01-17 NOTE — Assessment & Plan Note (Signed)
Stable.  Minimal edema bilateral lower extremities.  Okay to continue with Lasix 20 mg once a day.  Her last creatinine from outside labs was 1.16.  Stable.  No changes made.  Elevation. ?

## 2022-01-17 NOTE — Progress Notes (Signed)
?Cardiology Office Note:   ? ?Date:  01/18/2022  ? ?ID:  Joan [name], DOB May 10, 1946, MRN [id number] ? ?PCP:  Wardell Honour, MD ?  ?[facility] Providers ?Cardiologist:  Candee Furbish, MD    ? ?Referring MD: Wardell Honour, MD  ? ? ?History of Present Illness:   ? ?Joan [name] is a 76 y.o. female here for the 6 month follow up of hypertension, mitral valve regurgitation with history of rheumatoid arthritis on Enbrel and family history of CAD father.  ? ?In a previous visit back in November 2022, she was here for the evaluation of cardiac function, edema at the request of Dr. Alain Honey.  Her father died of a heart attack and she is very concerned that she needs to be seen. She has had dependent edema.  Has seen nephrologist who feels like venous stasis is likely the issue.  Support stockings elevation recommended.  She takes naproxen prednisone causing fluid retention as well.  She was started on Farxiga for edema as well.  This was stopped subsequently.  She was also given Lasix 20 mg with no refills.  Her last creatinine in October was 1.0.  She was told at one point that she had failing kidneys, this seems to have resolved. ? ?She had hypertension in the past, was previously started on losartan 100.  She has stopped HCTZ in the past.  Non-smoker.  Her father had myocardial infarction and died at age 48.  Sometimes she will feel palpitations.  Chest pain sometimes feels as though could be fibromyalgia.  Prior EKG shows sinus rhythm left anterior fascicular block nonspecific ST-T wave changes. ? ?I checked a stress test.  On 11/05/2017 nuclear stress test was low risk with normal perfusion. ? ?Edema present -this worries her.  She is worried that her heart function is abnormal.  She has mild shortness of breath, not severe.  No chest pain currently.  No syncope no arrhythmias. ? ? ? ?Today, She states that she is doing okay, however she adds that her back has been bothering her lately. She  reports that she has had some surgeries done and an elbow operation. ? ?She adds that she experiences shortness of breath, and has also has low energy. ? ?She remains compliant with Losartan. She also has been managing her arthritis. ? ?The patient denies chest pain, nocturnal dyspnea, or orthopnea.  There have been no palpitations, lightheadedness or syncope.  Complains of SOB.  ? ?Past Medical History:  ?Diagnosis Date  ? Alopecia areata 02/16/2006  ? Anxiety state, unspecified 2004  ? Benign paroxysmal positional vertigo 04/01/03  ? Cancer [facility])   ? skin   ? Cervicalgia 04/01/03  ? Chronic rhinitis 09/01/05  ? Corns and callosities   ? Dermatophytosis of nail   ? Diarrhea 04/15/2015  ? Diverticulosis   ? Dysphagia, unspecified(787.20) 2004  ? Enthesopathy of ankle and tarsus, unspecified 03/31/96  ? External hemorrhoids without mention of complication   ? Fibroadenosis of breast 04/01/03  ? Fibromyalgia   ? Hypertension 2004  ? Hypothyroidism   ? Impacted cerumen   ? right ear  ? Lumbago   ? Myalgia and myositis, unspecified 03/01/05  ? Nontoxic uninodular goiter   ? Osteoarthrosis, unspecified whether generalized or localized, unspecified site 04/01/03  ? Other left bundle branch block   ? Rheumatoid arthritis(714.0) 10/05/2004  ? lumbar spondylosis. Lumbo-sacral anterolisthesis    ? Senile osteoporosis 04/01/03  ? Sprain of metatarsophalangeal (joint) of foot   ?  Thyrotoxicosis   ? Thyrotoxicosis without mention of goiter or other cause, without mention of thyrotoxic crisis or storm   ? Unspecified vitamin D deficiency   ? Urinary frequency   ? ? ?Past Surgical History:  ?Procedure Laterality Date  ? COLONOSCOPY    ? COLONOSCOPY N/A 04/15/2015  ? Procedure: COLONOSCOPY;  Surgeon: Gatha Mayer, MD;  Location: [place];  Service: Endoscopy;  Laterality: N/A;  ? DG FINGERS, MULTIPLE LT HAND ([place] HX)    ? FOOT SURGERY Bilateral 05/22/2014  ? Dr.Strom   ? [place] SURGERY  12/12/2017  ? Dr Arnoldo Morale  ? [place]  ? DR [id number]   ? RIGHT WRIST SUGERY FOR TENOSYNOVITIS  1997  ? DR [place]  ? TUBAL LIGATION  1973  ? ? ?Current Medications: ?Current Meds  ?Medication Sig  ? acetaminophen (TYLENOL) 650 MG CR tablet Take 1,300 mg by mouth at bedtime.  ? amitriptyline (ELAVIL) 10 MG tablet TAKE 1 TABLET BY MOUTH AT BEDTIME TO  HELP  RELAX  MUSCLES  ? Biotin 1000 MCG tablet Take 1 tablet (1 mg total) by mouth 2 (two) times daily.  ? budesonide (ENTOCORT EC) 3 MG 24 hr capsule Take 3 capsules (9 mg total) by mouth daily.  ? Calcium Carb-Cholecalciferol (CALTRATE 600+D3) 600-800 MG-UNIT TABS Take 1 tablet by mouth 2 (two) times daily.  ? Cholecalciferol (VITAMIN D3) 50 MCG (2000 UT) capsule Take 1 capsule (2,000 Units total) by mouth daily.  ? cycloSPORINE (RESTASIS) 0.05 % ophthalmic emulsion Place 1 drop into both eyes 2 (two) times daily.  ? denosumab (PROLIA) 60 MG/ML SOSY injection Inject 60 mg into the skin every 6 (six) months.  ? diphenoxylate-atropine (LOMOTIL) 2.5-0.025 MG tablet TAKE 1 TO 2 TABLETS BY MOUTH EVERY 8 HOURS AS NEEDED  ? etanercept (ENBREL) 50 MG/ML injection Inject 50 mg into the skin once a week.  ? fluticasone (FLONASE) 50 MCG/ACT nasal spray Place 1 spray into both nostrils daily for 14 days.  ? folic acid (FOLVITE) 1 MG tablet Take 1 mg by mouth daily.  ? furosemide (LASIX) 20 MG tablet Take 1 tablet (20 mg total) by mouth daily.  ? Glucosamine-Chondroit-Vit C-Mn (GLUCOSAMINE CHONDR 500 COMPLEX PO) Take 2 tablets by mouth daily.   ? losartan (COZAAR) 100 MG tablet Take 1 tablet by mouth once daily  ? Menthol, Topical Analgesic, (ASPERCREME MAX ROLL-ON EX) Apply topically.  ? Multiple Vitamin (MULTIVITAMIN) tablet Take 1 tablet by mouth daily.   ? naproxen (NAPROSYN) 500 MG tablet Take 1 tablet (500 mg total) by mouth in the morning and at bedtime.  ? Omega-3 Fatty Acids (FISH OIL) 1000 MG CAPS Take 1,000 mg by mouth daily.   ? omeprazole (PRILOSEC) 40 MG capsule  Take 1 capsule by mouth once daily  ? predniSONE (DELTASONE) 5 MG tablet TAKE 1 TABLET BY MOUTH WITH BREAKFAST  ? Propylene Glycol 0.6 % SOLN Place 1 drop into both eyes 2 (two) times daily as needed (dry eyes).   ? traMADol (ULTRAM) 50 MG tablet Take by mouth 2 (two) times daily as needed.  ? tretinoin (RETIN-A) 0.025 % cream tretinoin 0.025 % topical cream ? APPLY TO FACE AT BEDTIME  ? urea (CARMOL) 10 % cream Apply topically as needed.  ? zinc gluconate 50 MG tablet Take by mouth as needed.  ?  ? ?Allergies:   Arava [leflunomide], Penicillins, Methotrexate, Penicillamine, Methotrexate derivatives, and Neurontin [gabapentin]  ? ?  Social History  ? ?Socioeconomic History  ? Marital status: Married  ?  Spouse name: Percell Miller  ? Number of children: 2  ? Years of education: Not on file  ? Highest education level: Not on file  ?Occupational History  ? Occupation: retired  ?Tobacco Use  ? Smoking status: Never  ? Smokeless tobacco: Never  ?Vaping Use  ? Vaping Use: Never used  ?Substance and Sexual Activity  ? Alcohol use: No  ?  Alcohol/week: 0.0 standard drinks  ? Drug use: No  ? Sexual activity: Yes  ?Other Topics Concern  ? Not on file  ?Social History Narrative  ? Not on file  ? ?Social Determinants of Health  ? ?Financial Resource Strain: Not on file  ?Food Insecurity: Not on file  ?Transportation Needs: Not on file  ?Physical Activity: Not on file  ?Stress: Not on file  ?Social Connections: Not on file  ?  ? ?Family History: ?The patient's family history includes Heart attack in her father; Heart disease in her father. There is no history of Colon cancer, Esophageal cancer, Rectal cancer, or Stomach cancer. ? ?ROS:   ?Please see the history of present illness. ?(+) Shortness of Breath ?(+) Fatigue ?(+) Back Pain ?All other systems are reviewed and negative.   ? ?EKGs/Labs/Other Studies Reviewed:   ? ?The following studies were reviewed today: ?Outside office notes, prior office notes, prior stress test lab work data  reviewed.  ? ?EKG:  EKG is personally reviewed.  ? ?01/17/2022 EKG: Rate 79. Sinus Rhythm left anterior fascicular block no changes from prior. ? ?07/15/2021 EKG: The ekg ordered today demonstrates sinu

## 2022-01-17 NOTE — Assessment & Plan Note (Signed)
Currently well controlled on losartan 100 mg a day.  Renal function as described.  Reviewed.  Stable.  No changes made. ?

## 2022-01-23 DIAGNOSIS — Z681 Body mass index (BMI) 19 or less, adult: Secondary | ICD-10-CM | POA: Diagnosis not present

## 2022-01-23 DIAGNOSIS — M21241 Flexion deformity, right finger joints: Secondary | ICD-10-CM | POA: Diagnosis not present

## 2022-01-23 DIAGNOSIS — M7022 Olecranon bursitis, left elbow: Secondary | ICD-10-CM | POA: Diagnosis not present

## 2022-01-23 DIAGNOSIS — M810 Age-related osteoporosis without current pathological fracture: Secondary | ICD-10-CM | POA: Diagnosis not present

## 2022-01-23 DIAGNOSIS — M1991 Primary osteoarthritis, unspecified site: Secondary | ICD-10-CM | POA: Diagnosis not present

## 2022-01-23 DIAGNOSIS — N1831 Chronic kidney disease, stage 3a: Secondary | ICD-10-CM | POA: Diagnosis not present

## 2022-01-23 DIAGNOSIS — M0579 Rheumatoid arthritis with rheumatoid factor of multiple sites without organ or systems involvement: Secondary | ICD-10-CM | POA: Diagnosis not present

## 2022-01-23 DIAGNOSIS — M5136 Other intervertebral disc degeneration, lumbar region: Secondary | ICD-10-CM | POA: Diagnosis not present

## 2022-01-24 ENCOUNTER — Other Ambulatory Visit: Payer: Self-pay | Admitting: Family Medicine

## 2022-01-24 DIAGNOSIS — K52838 Other microscopic colitis: Secondary | ICD-10-CM

## 2022-02-03 DIAGNOSIS — M810 Age-related osteoporosis without current pathological fracture: Secondary | ICD-10-CM | POA: Diagnosis not present

## 2022-02-03 DIAGNOSIS — D649 Anemia, unspecified: Secondary | ICD-10-CM | POA: Diagnosis not present

## 2022-02-03 DIAGNOSIS — M0579 Rheumatoid arthritis with rheumatoid factor of multiple sites without organ or systems involvement: Secondary | ICD-10-CM | POA: Diagnosis not present

## 2022-02-08 ENCOUNTER — Ambulatory Visit: Payer: Medicare PPO | Admitting: Family Medicine

## 2022-02-14 ENCOUNTER — Ambulatory Visit: Payer: Medicare PPO | Admitting: Family Medicine

## 2022-02-14 ENCOUNTER — Encounter: Payer: Self-pay | Admitting: Family Medicine

## 2022-02-14 VITALS — BP 140/80 | HR 75 | Temp 95.3°F | Ht <= 58 in | Wt 98.8 lb

## 2022-02-14 DIAGNOSIS — D649 Anemia, unspecified: Secondary | ICD-10-CM | POA: Diagnosis not present

## 2022-02-14 DIAGNOSIS — M069 Rheumatoid arthritis, unspecified: Secondary | ICD-10-CM

## 2022-02-14 DIAGNOSIS — E038 Other specified hypothyroidism: Secondary | ICD-10-CM

## 2022-02-14 DIAGNOSIS — I10 Essential (primary) hypertension: Secondary | ICD-10-CM

## 2022-02-14 NOTE — Progress Notes (Signed)
Provider:  Alain Honey, MD  Careteam: Patient Care Team: Wardell Honour, MD as PCP - General (Family Medicine) Jerline Pain, MD as PCP - Cardiology (Cardiology) Hennie Duos, MD as Consulting Physician (Rheumatology) Altheimer, Legrand Como, MD as Consulting Physician (Endocrinology) Newman Pies, MD as Consulting Physician (Neurosurgery) Clydell Hakim, MD (Inactive) as Consulting Physician (Anesthesiology) Jola Baptist, Madie Reno, MD (Inactive) as Consulting Physician (Diagnostic Radiology) Jolene Schimke, MD as Referring Physician (Dermatology) Lynnell Dike, OD as Consulting Physician (Optometry)  PLACE OF SERVICE:  [place] Directive information    Allergies  Allergen Reactions   Arava [Leflunomide] Diarrhea, Nausea Only and Other (See Comments)    Loss appetite   Penicillins Rash and Other (See Comments)    PATIENT HAS HAD A PCN REACTION WITH IMMEDIATE RASH, FACIAL/TONGUE/THROAT SWELLING, SOB, OR LIGHTHEADEDNESS WITH HYPOTENSION:  #  #  YES  #  #  Has patient had a PCN reaction causing severe rash involving mucus membranes or skin necrosis: no Has patient had a PCN reaction that required hospitalization: no    Methotrexate Other (See Comments)   Penicillamine Other (See Comments)   Methotrexate Derivatives Other (See Comments)    Hair loss   Neurontin [Gabapentin] Nausea And Vomiting    No chief complaint on file.    HPI: Patient is a 76 y.o. female patient is here today referred by her rheumatologist for anemia.  In reviewing her labs over the past 6 months there has been slow but steady decline in her hemoglobin.  Now her hemoglobin is 9.9.  She complains of malaise and fatigue.  She seems very preoccupied with her health.  She states that she cannot take iron because it gives her diarrhea.  In reviewing with her red blood cell indices, normocytic normochromic anemia associated with chronic disease is likely an iron supplementation as the  possible solution.  Review of Systems:  Review of Systems  Constitutional:  Positive for malaise/fatigue.  Respiratory: Negative.    Cardiovascular:  Positive for leg swelling.  Genitourinary: Negative.   Musculoskeletal:  Positive for joint pain.  Psychiatric/Behavioral:  The patient is nervous/anxious.   All other systems reviewed and are negative.   Past Medical History:  Diagnosis Date   Alopecia areata 02/16/2006   Anxiety state, unspecified 2004   Benign paroxysmal positional vertigo 04/01/03   Cancer ([place])    skin    Cervicalgia 04/01/03   Chronic rhinitis 09/01/05   Corns and callosities    Dermatophytosis of nail    Diarrhea 04/15/2015   Diverticulosis    Dysphagia, unspecified(787.20) 2004   Enthesopathy of ankle and tarsus, unspecified 03/31/96   External hemorrhoids without mention of complication    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 Lumbo-sacral anterolisthesis     Senile osteoporosis 04/01/03   Sprain of metatarsophalangeal (joint) of foot    Thyrotoxicosis    Thyrotoxicosis without mention of goiter or other cause, without mention of thyrotoxic crisis or storm    Unspecified vitamin D deficiency    Urinary frequency    Past Surgical History:  Procedure Laterality Date  COLONOSCOPY     COLONOSCOPY N/A 04/15/2015   Procedure: COLONOSCOPY;  Surgeon: Gatha Mayer, MD;  Location: [place];  Service: Endoscopy;  Laterality: N/A;   DG FINGERS, MULTIPLE LT HAND ([facility] HX)     FOOT SURGERY Bilateral 05/22/2014   Dr.Strom    LUMBAR DISC SURGERY  12/12/2017   Dr Arnoldo Morale   REMOVE GANGLION  1998   DR [facility]     RIGHT WRIST SUGERY FOR TENOSYNOVITIS  1997   DR [place]   TUBAL LIGATION  1973   Social History:   reports that she has never smoked. She has never used smokeless tobacco. She reports that she does not drink alcohol and does not use drugs.  Family History  Problem Relation Age of Onset   Heart attack Father        age 39   Heart disease Father    Colon cancer Neg Hx    Esophageal cancer Neg Hx    Rectal cancer Neg Hx    Stomach cancer Neg Hx     Medications: Patient's Medications  New Prescriptions   No medications on file  Previous Medications   ACETAMINOPHEN (TYLENOL) 650 MG CR TABLET    Take 1,300 mg by mouth at bedtime.   AMITRIPTYLINE (ELAVIL) 10 MG TABLET    TAKE 1 TABLET BY MOUTH AT BEDTIME TO  HELP  RELAX  MUSCLES   BIOTIN 1000 MCG TABLET    Take 1 tablet (1 mg total) by mouth 2 (two) times daily.   BUDESONIDE (ENTOCORT EC) 3 MG 24 HR CAPSULE    Take 3 capsules (9 mg total) by mouth daily.   CALCIUM CARB-CHOLECALCIFEROL (CALTRATE 600+D3) 600-800 MG-UNIT TABS    Take 1 tablet by mouth 2 (two) times daily.   CHOLECALCIFEROL (VITAMIN D3) 50 MCG (2000 UT) CAPSULE    Take 1 capsule (2,000 Units total) by mouth daily.   CYCLOSPORINE (RESTASIS) 0.05 % OPHTHALMIC EMULSION    Place 1 drop into both eyes 2 (two) times daily.   DENOSUMAB (PROLIA) 60 MG/ML SOSY INJECTION    Inject 60 mg into the skin every 6 (six) months.   DIPHENOXYLATE-ATROPINE (LOMOTIL) 2.5-0.025 MG TABLET    TAKE 1 TO 2 TABLETS BY MOUTH EVERY 8 HOURS AS NEEDED   ETANERCEPT (ENBREL) 50 MG/ML INJECTION    Inject 50 mg into the skin once a week.   FLUTICASONE (FLONASE) 50 MCG/ACT NASAL SPRAY    Place 1 spray into both nostrils daily for 14 days.   FOLIC ACID (FOLVITE) 1 MG TABLET    Take 1 mg by mouth daily.   FUROSEMIDE (LASIX) 20 MG TABLET    Take 1 tablet (20 mg total) by mouth daily.   GLUCOSAMINE-CHONDROIT-VIT C-MN (GLUCOSAMINE CHONDR 500 COMPLEX PO)    Take 2 tablets  by mouth daily.    LOSARTAN (COZAAR) 100 MG TABLET    Take 1 tablet by mouth once daily   MENTHOL, TOPICAL ANALGESIC, (ASPERCREME MAX ROLL-ON EX)    Apply topically.   MULTIPLE VITAMIN (MULTIVITAMIN) TABLET    Take 1 tablet by mouth daily.    NAPROXEN (NAPROSYN) 500 MG TABLET    Take 1 tablet (500 mg total) by mouth in the morning and at bedtime.   OMEGA-3 FATTY ACIDS (FISH OIL) 1000 MG CAPS    Take 1,000 mg by mouth daily.    OMEPRAZOLE (PRILOSEC) 40 MG CAPSULE    Take 1 capsule by mouth once  daily   PREDNISONE (DELTASONE) 5 MG TABLET    TAKE 1 TABLET BY MOUTH WITH BREAKFAST   PROPYLENE GLYCOL 0.6 % SOLN    Place 1 drop into both eyes 2 (two) times daily as needed (dry eyes).    TRAMADOL (ULTRAM) 50 MG TABLET    Take by mouth 2 (two) times daily as needed.   TRETINOIN (RETIN-A) 0.025 % CREAM    tretinoin 0.025 % topical cream  APPLY TO FACE AT BEDTIME   UREA (CARMOL) 10 % CREAM    Apply topically as needed.   ZINC GLUCONATE 50 MG TABLET    Take by mouth as needed.  Modified Medications   No medications on file  Discontinued Medications   No medications on file    Physical Exam:  Vitals:   02/14/22 0819  BP: 140/80  Pulse: 75  Temp: (!) 95.3 F (35.2 C)  SpO2: 96%  Weight: 98 lb 12.8 oz (44.8 kg)  Height: '4\' 6"'$  (1.372 m)   Body mass index is 23.82 kg/m. Wt Readings from Last 3 Encounters:  02/14/22 98 lb 12.8 oz (44.8 kg)  01/17/22 96 lb (43.5 kg)  11/07/21 97 lb (44 kg)    Physical Exam Vitals and nursing note reviewed.  Constitutional:      Appearance: Normal appearance.  Cardiovascular:     Rate and Rhythm: Normal rate and regular rhythm.     Heart sounds: Murmur heard.  Pulmonary:     Effort: Pulmonary effort is normal.     Breath sounds: Normal breath sounds.  Neurological:     General: No focal deficit present.     Mental Status: She is alert and oriented to person, place, and time.     Labs reviewed: Basic Metabolic Panel: No results for input(s):  "NA", "K", "CL", "CO2", "GLUCOSE", "BUN", "CREATININE", "CALCIUM", "MG", "PHOS", "TSH" in the last 8760 hours. Liver Function Tests: No results for input(s): "AST", "ALT", "ALKPHOS", "BILITOT", "PROT", "ALBUMIN" in the last 8760 hours. No results for input(s): "LIPASE", "AMYLASE" in the last 8760 hours. No results for input(s): "AMMONIA" in the last 8760 hours. CBC: No results for input(s): "WBC", "NEUTROABS", "HGB", "HCT", "MCV", "PLT" in the last 8760 hours. Lipid Panel: No results for input(s): "CHOL", "HDL", "LDLCALC", "TRIG", "CHOLHDL", "LDLDIRECT" in the last 8760 hours. TSH: No results for input(s): "TSH" in the last 8760 hours. A1C: Lab Results  Component Value Date   HGBA1C 5.2 11/17/2019     Assessment/Plan  1. Anemia, unspecified type Likely related to chronic disease, arthritis we will check ferritin and iron levels today  2. Primary hypertension Blood pressure 140/80 on losartan 100 mg  3. Other specified hypothyroidism TSH within normal limits.  This is followed by endocrine.  Not likely cause of fatigue  4. Rheumatoid arthritis, involving unspecified site, unspecified whether rheumatoid factor present [facility]) Followed by a rheumatologist   Alain Honey, MD [facility] [phone]

## 2022-02-15 ENCOUNTER — Other Ambulatory Visit: Payer: Self-pay

## 2022-02-15 DIAGNOSIS — D649 Anemia, unspecified: Secondary | ICD-10-CM

## 2022-02-15 LAB — IRON,TIBC AND FERRITIN PANEL
%SAT: 10 % (calc) — ABNORMAL LOW (ref 16–45)
Ferritin: 15 ng/mL — ABNORMAL LOW (ref 16–288)
Iron: 35 ug/dL — ABNORMAL LOW (ref 45–160)
TIBC: 339 mcg/dL (calc) (ref 250–450)

## 2022-02-23 DIAGNOSIS — L02416 Cutaneous abscess of left lower limb: Secondary | ICD-10-CM | POA: Diagnosis not present

## 2022-02-23 DIAGNOSIS — L570 Actinic keratosis: Secondary | ICD-10-CM | POA: Diagnosis not present

## 2022-02-23 DIAGNOSIS — L578 Other skin changes due to chronic exposure to nonionizing radiation: Secondary | ICD-10-CM | POA: Diagnosis not present

## 2022-02-23 DIAGNOSIS — L0390 Cellulitis, unspecified: Secondary | ICD-10-CM | POA: Diagnosis not present

## 2022-02-23 DIAGNOSIS — L821 Other seborrheic keratosis: Secondary | ICD-10-CM | POA: Diagnosis not present

## 2022-02-24 ENCOUNTER — Other Ambulatory Visit: Payer: Self-pay | Admitting: *Deleted

## 2022-02-24 MED ORDER — TRAMADOL HCL 50 MG PO TABS: 50.0000 mg | ORAL_TABLET | Freq: Two times a day (BID) | ORAL | 5 refills | Status: AC | PRN

## 2022-02-28 DIAGNOSIS — Z6820 Body mass index (BMI) 20.0-20.9, adult: Secondary | ICD-10-CM | POA: Diagnosis not present

## 2022-02-28 DIAGNOSIS — D500 Iron deficiency anemia secondary to blood loss (chronic): Secondary | ICD-10-CM | POA: Diagnosis not present

## 2022-02-28 DIAGNOSIS — Z79899 Other long term (current) drug therapy: Secondary | ICD-10-CM | POA: Diagnosis not present

## 2022-02-28 DIAGNOSIS — Z131 Encounter for screening for diabetes mellitus: Secondary | ICD-10-CM | POA: Diagnosis not present

## 2022-03-10 DIAGNOSIS — M4317 Spondylolisthesis, lumbosacral region: Secondary | ICD-10-CM | POA: Diagnosis not present

## 2022-03-10 DIAGNOSIS — M4150 Other secondary scoliosis, site unspecified: Secondary | ICD-10-CM | POA: Diagnosis not present

## 2022-03-13 ENCOUNTER — Telehealth: Payer: Self-pay | Admitting: Internal Medicine

## 2022-03-13 NOTE — Telephone Encounter (Signed)
Pt scheduled to see Dr. Henrene Pastor 03/14/22'@3'$ :20pm. Pt aware of appt.

## 2022-03-14 ENCOUNTER — Ambulatory Visit: Payer: Medicare PPO | Admitting: Internal Medicine

## 2022-03-14 ENCOUNTER — Encounter: Payer: Self-pay | Admitting: Internal Medicine

## 2022-03-14 VITALS — BP 120/78 | HR 80 | Ht <= 58 in | Wt 98.2 lb

## 2022-03-14 DIAGNOSIS — D509 Iron deficiency anemia, unspecified: Secondary | ICD-10-CM

## 2022-03-14 DIAGNOSIS — K222 Esophageal obstruction: Secondary | ICD-10-CM

## 2022-03-14 DIAGNOSIS — R197 Diarrhea, unspecified: Secondary | ICD-10-CM

## 2022-03-14 DIAGNOSIS — K52839 Microscopic colitis, unspecified: Secondary | ICD-10-CM | POA: Diagnosis not present

## 2022-03-14 DIAGNOSIS — K219 Gastro-esophageal reflux disease without esophagitis: Secondary | ICD-10-CM | POA: Diagnosis not present

## 2022-03-14 MED ORDER — DIPHENOXYLATE-ATROPINE 2.5-0.025 MG PO TABS: ORAL_TABLET | ORAL | 6 refills | Status: DC

## 2022-03-14 NOTE — Progress Notes (Signed)
HISTORY OF PRESENT ILLNESS:  Joan [name] is a 76 y.o. female with multiple significant medical problems as listed below.  She is followed in this office for:  1.  Microscopic colitis diagnosed December 0093. 2.  GERD complicated by peptic stricture.  She was last seen in this office November 07, 2021.  See that dictation for details.  At that time she reported 3 bowel movements per day, mostly formed.  She was instructed to taper budesonide from the current dose of 9 mg daily with plans for follow-up in 1 year.  Patient sent today by her PCP regarding iron deficiency anemia.  Also, complaints of recurrent diarrhea.  Review of outside blood work from February 28, 2022 reveals anemia with hemoglobin 10.5.  MCV 82.  Unremarkable comprehensive metabolic panel.  Iron saturation is 8%.  Ferritin 2 weeks earlier was 15.  She denies melena or hematochezia.  She tells me that about 3 weeks ago she was placed on iron.  After that she began to have diarrhea.  Interestingly, she did not decrease her budesonide.  She still remains on 9 mg daily.  She has been using Imodium once or twice daily.  She is out of Lomotil.  This helped.  She feels that her iron is causing diarrhea.  No abdominal pain.  She did undergo both colonoscopy and upper endoscopy August 27, 2019.  There was diverticulosis but no neoplasia.  Biopsies confirmed collagenous colitis.  Upper endoscopy revealed esophageal stricture which was dilated due to complaints of dysphagia.  She was also noted to have multiple antral ulcers felt secondary to NSAIDs.  She does continue on omeprazole 40 mg daily.  She is accompanied today by her husband.  She requested dermatology referral. The other complaint is recurrent dysphagia, particularly pills.  She says she did benefit from previous dilation.   REVIEW OF SYSTEMS:  All non-GI ROS negative unless otherwise stated in the HPI except for arthritis, back pain, fatigue, lower extremity swelling  Past  Medical History:  Diagnosis Date   Alopecia areata 02/16/2006   Anxiety state, unspecified 2004   Benign paroxysmal positional vertigo 04/01/03   Cancer ([place])    skin    Cervicalgia 04/01/03   Chronic rhinitis 09/01/05   Corns and callosities    Dermatophytosis of nail    Diarrhea 04/15/2015   Diverticulosis    Dysphagia, unspecified(787.20) 2004   Enthesopathy of ankle and tarsus, unspecified 03/31/96   External hemorrhoids without mention of complication    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 Lumbo-sacral anterolisthesis     Senile osteoporosis 04/01/03   Sprain of metatarsophalangeal (joint) of foot    Thyrotoxicosis    Thyrotoxicosis without mention of goiter or other cause, without mention of thyrotoxic crisis or storm    Unspecified vitamin D deficiency    Urinary frequency     Past Surgical History:  Procedure Laterality Date   COLONOSCOPY     COLONOSCOPY N/A 04/15/2015   Procedure: COLONOSCOPY;  Surgeon: Gatha Mayer, MD;  Location: [facility];  Service: Endoscopy;  Laterality: N/A;   DG FINGERS, MULTIPLE LT HAND ([place] HX)  FOOT SURGERY Bilateral 05/22/2014   Dr.Strom    LUMBAR DISC SURGERY  12/12/2017   Dr Arnoldo Morale   REMOVE GANGLION  1998   DR [facility]    RIGHT WRIST SUGERY FOR TENOSYNOVITIS  1997   DR SYPHER    TONSILLECTOMY AND ADENOIDECTOMY  1974   TUBAL LIGATION  1973    Social History Joan [name]  reports that she has never smoked. She has never used smokeless tobacco. She reports that she does not drink alcohol and does not use drugs.  family history includes Heart attack in her father; Heart  disease in her father.  Allergies  Allergen Reactions   Arava [Leflunomide] Diarrhea, Nausea Only and Other (See Comments)    Loss appetite   Penicillins Rash and Other (See Comments)    PATIENT HAS HAD A PCN REACTION WITH IMMEDIATE RASH, FACIAL/TONGUE/THROAT SWELLING, SOB, OR LIGHTHEADEDNESS WITH HYPOTENSION:  #  #  YES  #  #  Has patient had a PCN reaction causing severe rash involving mucus membranes or skin necrosis: no Has patient had a PCN reaction that required hospitalization: no    Methotrexate Other (See Comments)   Penicillamine Other (See Comments)   Methotrexate Derivatives Other (See Comments)    Hair loss   Neurontin [Gabapentin] Nausea And Vomiting       PHYSICAL EXAMINATION: Vital signs: Ht 4' 6.5" (1.384 m)   Wt 98 lb 4 oz (44.6 kg)   BMI 23.26 kg/m   Constitutional: Frail elderly female, no acute distress Psychiatric: alert and oriented x3, cooperative Eyes: extraocular movements intact, anicteric, conjunctiva pink Mouth: oral pharynx moist, no lesions Neck: supple no lymphadenopathy Cardiovascular: heart regular rate and rhythm, no murmur Lungs: clear to auscultation bilaterally Abdomen: soft, nontender, nondistended, no obvious ascites, no peritoneal signs, normal bowel sounds, no organomegaly Rectal: Omitted Extremities: Deforming arthritis.  No clubbing or cyanosis.  1+ edema bilaterally.  Stasis changes.  Wound to the posterior aspect of the lower left leg.  Closed.  Lower extremity edema bilaterally Skin: no additional lesions on visible extremities Neuro: No focal deficits.  Cranial nerves intact  ASSESSMENT:  1.  Iron deficiency anemia.  Suspect secondary to NSAID related ulcers.  There could be an element of anemia of chronic disease 2.  GERD with peptic stricture. 3.  Recurrent dysphagia likely due to known peptic stricture 4.  Collagenous colitis.  Remains on budesonide 9 mg daily 5.  Diarrhea of 3 weeks duration.  She attributes this to iron.   I am not sure.  Remains on budesonide 9 mg daily. 6.  Colonoscopy 2020 as described  PLAN:  1.  Hold iron for now.  We will see if the diarrhea improves.  If it is determined that iron can cause some diarrhea, then she can have iron infusions in the form of replacement therapy. 2.  Prescribe Lomotil.  1-2 every 8 hours as needed.  Medication risk reviewed 3.  Continue PPI in the form of omeprazole 40 mg daily 4.  Schedule upper endoscopy with esophageal dilation.  Evaluate iron deficiency anemia as well as address dysphagia.  The patient is high risk given her age and comorbidities.The nature of the procedure, as well as the risks, benefits, and alternatives were carefully and thoroughly reviewed with the patient. Ample time for discussion and questions allowed. The patient understood, was satisfied, and agreed to proceed.  5.  Further recommendations and follow-up to be arranged after the above.

## 2022-03-14 NOTE — Patient Instructions (Signed)
If you are age 75 or older, your body mass index should be between 23-30. Your Body mass index is 23.26 kg/m. If this is out of the aforementioned range listed, please consider follow up with your Primary Care Provider.  If you are age 47 or younger, your body mass index should be between 19-25. Your Body mass index is 23.26 kg/m. If this is out of the aformentioned range listed, please consider follow up with your Primary Care Provider.   ________________________________________________________  The [facility] providers would like to encourage you to use [facility] to communicate with providers for non-urgent requests or questions.  Due to long hold times on the telephone, sending your provider a message by [facility] may be a faster and more efficient way to get a response.  Please allow 48 business hours for a response.  Please remember that this is for non-urgent requests.  _______________________________________________________  We have sent the following medications to your pharmacy for you to pick up at your convenience:  Lomotil  You have been scheduled for an endoscopy. Please follow written instructions given to you at your visit today. If you use inhalers (even only as needed), please bring them with you on the day of your procedure.

## 2022-03-15 ENCOUNTER — Telehealth: Payer: Self-pay | Admitting: *Deleted

## 2022-03-15 NOTE — Telephone Encounter (Signed)
Patient called stating that she needs some Fluid pills prescribed because she is having swelling.   Offered patient an appointment/virtual for evaluation for today and patient refused. Offered several appointment times for this week and patient stated that she would not be able to make it and stated that she will call back.

## 2022-03-16 ENCOUNTER — Telehealth: Payer: Self-pay | Admitting: Internal Medicine

## 2022-03-16 ENCOUNTER — Ambulatory Visit (AMBULATORY_SURGERY_CENTER): Payer: Medicare PPO | Admitting: Internal Medicine

## 2022-03-16 ENCOUNTER — Encounter: Payer: Self-pay | Admitting: Internal Medicine

## 2022-03-16 VITALS — BP 164/82 | HR 70 | Temp 97.2°F | Resp 16 | Ht <= 58 in | Wt 98.0 lb

## 2022-03-16 DIAGNOSIS — R197 Diarrhea, unspecified: Secondary | ICD-10-CM | POA: Diagnosis not present

## 2022-03-16 DIAGNOSIS — K449 Diaphragmatic hernia without obstruction or gangrene: Secondary | ICD-10-CM

## 2022-03-16 DIAGNOSIS — K219 Gastro-esophageal reflux disease without esophagitis: Secondary | ICD-10-CM | POA: Diagnosis not present

## 2022-03-16 DIAGNOSIS — K52839 Microscopic colitis, unspecified: Secondary | ICD-10-CM | POA: Diagnosis not present

## 2022-03-16 DIAGNOSIS — D509 Iron deficiency anemia, unspecified: Secondary | ICD-10-CM | POA: Diagnosis not present

## 2022-03-16 DIAGNOSIS — K222 Esophageal obstruction: Secondary | ICD-10-CM

## 2022-03-16 DIAGNOSIS — R1310 Dysphagia, unspecified: Secondary | ICD-10-CM | POA: Diagnosis not present

## 2022-03-16 MED ORDER — DIPHENOXYLATE-ATROPINE 2.5-0.025 MG PO TABS: ORAL_TABLET | ORAL | 6 refills | Status: DC

## 2022-03-16 MED ORDER — SODIUM CHLORIDE 0.9 % IV SOLN: 500.0000 mL | Freq: Once | INTRAVENOUS | Status: DC

## 2022-03-16 NOTE — Telephone Encounter (Signed)
Faxed Lomotil to pharmacy  

## 2022-03-16 NOTE — Progress Notes (Signed)
Sedate, gd SR, tolerated procedure well, VSS, report to RN 

## 2022-03-16 NOTE — Op Note (Signed)
[facility] Patient Name: Joan [name] Procedure Date: 03/16/2022 9:27 AM MRN: [id number] Endoscopist: Docia Chuck. Henrene Pastor , MD Age: 76 Referring MD:  Date of Birth: 1946/04/19 Gender: Female Account #: [id number] Procedure:                Upper GI endoscopy with esophageal dilation.                            Balloon 20 mm max Indications:              Dysphagia, Therapeutic procedure, Iron deficiency                            anemia, Esophageal reflux Medicines:                Monitored Anesthesia Care Procedure:                Pre-Anesthesia Assessment:                           - Prior to the procedure, a History and Physical                            was performed, and patient medications and                            allergies were reviewed. The patient's tolerance of                            previous anesthesia was also reviewed. The risks                            and benefits of the procedure and the sedation                            options and risks were discussed with the patient.                            All questions were answered, and informed consent                            was obtained. Prior Anticoagulants: The patient has                            taken no previous anticoagulant or antiplatelet                            agents. ASA Grade Assessment: III - A patient with                            severe systemic disease. After reviewing the risks                            and benefits, the patient was deemed in  satisfactory condition to undergo the procedure.                           After obtaining informed consent, the endoscope was                            passed under direct vision. Throughout the                            procedure, the patient's blood pressure, pulse, and                            oxygen saturations were monitored continuously. The                            GIF [id number] #[id number] was introduced  through the                            mouth, and advanced to the second part of duodenum.                            The upper GI endoscopy was accomplished without                            difficulty. The patient tolerated the procedure                            well. Scope In: Scope Out: Findings:                 One benign-appearing, intrinsic moderate stenosis                            was found 32 cm from the incisors. This stenosis                            measured 1.4 cm (inner diameter). A TTS dilator was                            passed through the scope. Dilation with an 18-19-20                            mm balloon dilator was performed to 20 mm.                           The cricopharyngeus was somewhat tight. The exam of                            the esophagus was otherwise normal.                           The stomach revealed a moderately large hiatal                            hernia.  No obvious Cameron erosions. There was mild                            superficial ulceration in the prepyloric region.                           The examined duodenum was normal.                           The cardia and gastric fundus were normal on                            retroflexion, save hiatal hernia. Complications:            No immediate complications. Estimated Blood Loss:     Estimated blood loss: none. Impression:               1. Take cricopharyngeus                           2. Distal esophageal stricture status post balloon                            dilation                           3. Moderate to large hiatal hernia                           4. Prepyloric ulceration                           5. Normal duodenum. Recommendation:           1. Patient has a contact number available for                            emergencies. The signs and symptoms of potential                            delayed complications were discussed with the                             patient. Return to normal activities tomorrow.                            Written discharge instructions were provided to the                            patient.                           2. Post dilation diet.                           3. Continue present medications.                           4.  Contact Dr. Henrene Pastor at the beginning of next week                            to see how the diarrhea is doing off of iron                           5. Routine office follow-up 2 months Docia Chuck. Henrene Pastor, MD 03/16/2022 9:52:06 AM This report has been signed electronically.

## 2022-03-16 NOTE — Progress Notes (Signed)
VS completed by CW.   Pt's states no medical or surgical changes since previsit or office visit.  

## 2022-03-16 NOTE — Progress Notes (Signed)
Called to room to assist during endoscopic procedure.  Patient ID and intended procedure confirmed with present staff. Received instructions for my participation in the procedure from the performing physician.  

## 2022-03-16 NOTE — Progress Notes (Signed)
HISTORY OF PRESENT ILLNESS:   Joan [name] is a 76 y.o. female with multiple significant medical problems as listed below.  She is followed in this office for:  1.  Microscopic colitis diagnosed December 9147. 2.  GERD complicated by peptic stricture.  She was last seen in this office November 07, 2021.  See that dictation for details.  At that time she reported 3 bowel movements per day, mostly formed.  She was instructed to taper budesonide from the current dose of 9 mg daily with plans for follow-up in 1 year.  Patient sent today by her PCP regarding iron deficiency anemia.  Also, complaints of recurrent diarrhea.  Review of outside blood work from February 28, 2022 reveals anemia with hemoglobin 10.5.  MCV 82.  Unremarkable comprehensive metabolic panel.  Iron saturation is 8%.  Ferritin 2 weeks earlier was 15.  She denies melena or hematochezia.  She tells me that about 3 weeks ago she was placed on iron.  After that she began to have diarrhea.  Interestingly, she did not decrease her budesonide.  She still remains on 9 mg daily.  She has been using Imodium once or twice daily.  She is out of Lomotil.  This helped.  She feels that her iron is causing diarrhea.  No abdominal pain.  She did undergo both colonoscopy and upper endoscopy August 27, 2019.  There was diverticulosis but no neoplasia.  Biopsies confirmed collagenous colitis.  Upper endoscopy revealed esophageal stricture which was dilated due to complaints of dysphagia.  She was also noted to have multiple antral ulcers felt secondary to NSAIDs.  She does continue on omeprazole 40 mg daily.  She is accompanied today by her husband.  She requested dermatology referral. The other complaint is recurrent dysphagia, particularly pills.  She says she did benefit from previous dilation.    REVIEW OF SYSTEMS:   All non-GI ROS negative unless otherwise stated in the HPI except for arthritis, back pain, fatigue, lower extremity swelling        Past Medical History:  Diagnosis Date   Alopecia areata 02/16/2006   Anxiety state, unspecified 2004   Benign paroxysmal positional vertigo 04/01/03   Cancer ([place])      skin    Cervicalgia 04/01/03   Chronic rhinitis 09/01/05   Corns and callosities     Dermatophytosis of nail     Diarrhea 04/15/2015   Diverticulosis     Dysphagia, unspecified(787.20) 2004   Enthesopathy of ankle and tarsus, unspecified 03/31/96   External hemorrhoids without mention of complication     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 Lumbo-sacral anterolisthesis     Senile osteoporosis 04/01/03   Sprain of metatarsophalangeal (joint) of foot     Thyrotoxicosis     Thyrotoxicosis without mention of goiter or other cause, without mention of thyrotoxic crisis or storm     Unspecified vitamin D deficiency     Urinary frequency             Past Surgical History:  Procedure Laterality Date   COLONOSCOPY  COLONOSCOPY N/A 04/15/2015    Procedure: COLONOSCOPY;  Surgeon: Gatha Mayer, MD;  Location: [facility];  Service: Endoscopy;  Laterality: N/A;   DG FINGERS, MULTIPLE LT HAND ([place] HX)       FOOT SURGERY Bilateral 05/22/2014    Dr.Strom    LUMBAR DISC SURGERY   12/12/2017    Dr Arnoldo Morale   REMOVE GANGLION   1998    DR [facility]    RIGHT WRIST SUGERY FOR TENOSYNOVITIS   1997    DR SYPHER    TONSILLECTOMY AND ADENOIDECTOMY   1974   TUBAL LIGATION   1973      Social History Joan [name]  reports that she has never smoked. She has never used smokeless tobacco. She reports that she does not drink alcohol and does not use drugs.   family  history includes Heart attack in her father; Heart disease in her father.        Allergies  Allergen Reactions   Arava [Leflunomide] Diarrhea, Nausea Only and Other (See Comments)      Loss appetite   Penicillins Rash and Other (See Comments)      PATIENT HAS HAD A PCN REACTION WITH IMMEDIATE RASH, FACIAL/TONGUE/THROAT SWELLING, SOB, OR LIGHTHEADEDNESS WITH HYPOTENSION:  #  #  YES  #  #  Has patient had a PCN reaction causing severe rash involving mucus membranes or skin necrosis: no Has patient had a PCN reaction that required hospitalization: no     Methotrexate Other (See Comments)   Penicillamine Other (See Comments)   Methotrexate Derivatives Other (See Comments)      Hair loss   Neurontin [Gabapentin] Nausea And Vomiting          PHYSICAL EXAMINATION: Vital signs: Ht 4' 6.5" (1.384 m)   Wt 98 lb 4 oz (44.6 kg)   BMI 23.26 kg/m   Constitutional: Frail elderly female, no acute distress Psychiatric: alert and oriented x3, cooperative Eyes: extraocular movements intact, anicteric, conjunctiva pink Mouth: oral pharynx moist, no lesions Neck: supple no lymphadenopathy Cardiovascular: heart regular rate and rhythm, no murmur Lungs: clear to auscultation bilaterally Abdomen: soft, nontender, nondistended, no obvious ascites, no peritoneal signs, normal bowel sounds, no organomegaly Rectal: Omitted Extremities: Deforming arthritis.  No clubbing or cyanosis.  1+ edema bilaterally.  Stasis changes.  Wound to the posterior aspect of the lower left leg.  Closed.  Lower extremity edema bilaterally Skin: no additional lesions on visible extremities Neuro: No focal deficits.  Cranial nerves intact   ASSESSMENT:   1.  Iron deficiency anemia.  Suspect secondary to NSAID related ulcers.  There could be an element of anemia of chronic disease 2.  GERD with peptic stricture. 3.  Recurrent dysphagia likely due to known peptic stricture 4.  Collagenous colitis.  Remains on budesonide 9 mg  daily 5.  Diarrhea of 3 weeks duration.  She attributes this to iron.  I am not sure.  Remains on budesonide 9 mg daily. 6.  Colonoscopy 2020 as described   PLAN:   1.  Hold iron for now.  We will see if the diarrhea improves.  If it is determined that iron can cause some diarrhea, then she can have iron infusions in the form of replacement therapy. 2.  Prescribe Lomotil.  1-2 every 8 hours as needed.  Medication risk reviewed 3.  Continue PPI in the form of omeprazole 40 mg daily 4.  Schedule upper endoscopy with esophageal dilation.  Evaluate iron deficiency anemia as well  as address dysphagia.  The patient is high risk given her age and comorbidities.The nature of the procedure, as well as the risks, benefits, and alternatives were carefully and thoroughly reviewed with the patient. Ample time for discussion and questions allowed. The patient understood, was satisfied, and agreed to proceed.  5.  Further recommendations and follow-up to be arranged after the above.

## 2022-03-16 NOTE — Telephone Encounter (Signed)
Patient called.  She had a procedure with Dr. Henrene Pastor a couple days ago and he was going to have a prescription of Lomotil sent to the [facility] in [place].  She called today to inquire about it, but they said they did not receive the prescription.  Can you please resend it to them?  Thank you.

## 2022-03-16 NOTE — Patient Instructions (Signed)
FOLLOW DILATATION DIET GIVEN TO YOU TODAY  CONTACT DR Henrene Pastor AT THE BEGINNING OF NEXT WEEK (TUESDAY)TO LET HIM KNOW HOW DIARRHEA IS DOING OFF OF THE IRON  MAKE A ROUTINE OFFICE APPOINTMENT FOLLOW UP WITH DR PERRY FOR 2 MONTHS FROM TODAY   YOU HAD AN ENDOSCOPIC PROCEDURE TODAY AT [facility]:   Refer to the procedure report that was given to you for any specific questions about what was found during the examination.  If the procedure report does not answer your questions, please call your gastroenterologist to clarify.  If you requested that your care partner not be given the details of your procedure findings, then the procedure report has been included in a sealed envelope for you to review at your convenience later.  YOU SHOULD EXPECT: Some feelings of bloating in the abdomen. Passage of more gas than usual.  Walking can help get rid of the air that was put into your GI tract during the procedure and reduce the bloating. If you had a lower endoscopy (such as a colonoscopy or flexible sigmoidoscopy) you may notice spotting of blood in your stool or on the toilet paper. If you underwent a bowel prep for your procedure, you may not have a normal bowel movement for a few days.  Please Note:  You might notice some irritation and congestion in your nose or some drainage.  This is from the oxygen used during your procedure.  There is no need for concern and it should clear up in a day or so.  SYMPTOMS TO REPORT IMMEDIATELY:  Following upper endoscopy (EGD)  Vomiting of blood or coffee ground material  New chest pain or pain under the shoulder blades  Painful or persistently difficult swallowing  New shortness of breath  Fever of 100F or higher  Black, tarry-looking stools  For urgent or emergent issues, a gastroenterologist can be reached at any hour by calling [phone]. Do not use MyChart messaging for urgent concerns.    DIET:  We do recommend a small meal at first,  but then you may proceed to your regular diet.  Drink plenty of fluids but you should avoid alcoholic beverages for 24 hours.  ACTIVITY:  You should plan to take it easy for the rest of today and you should NOT DRIVE or use heavy machinery until tomorrow (because of the sedation medicines used during the test).    FOLLOW UP: Our staff will call the number listed on your records the next business day following your procedure.  We will call around 7:15- 8:00 am to check on you and address any questions or concerns that you may have regarding the information given to you following your procedure. If we do not reach you, we will leave a message.  If you develop any symptoms (ie: fever, flu-like symptoms, shortness of breath, cough etc.) before then, please call [phone].  If you test positive for Covid 19 in the 2 weeks post procedure, please call and report this information to [place].    If any biopsies were taken you will be contacted by phone or by letter within the next 1-3 weeks.  Please call us at [phone] if you have not heard about the biopsies in 3 weeks.    SIGNATURES/CONFIDENTIALITY: You and/or your care partner have signed paperwork which will be entered into your electronic medical record.  These signatures attest to the fact that that the information above on your After Visit Summary has been reviewed  and is understood.  Full responsibility of the confidentiality of this discharge information lies with you and/or your care-partner.

## 2022-03-17 ENCOUNTER — Telehealth: Payer: Self-pay

## 2022-03-17 NOTE — Telephone Encounter (Signed)
Pt called and stated [facility] did not receive the rx for Lomotil I had faxed.  I called pharmacy and called it in verbally.

## 2022-03-21 ENCOUNTER — Telehealth: Payer: Self-pay | Admitting: Physician Assistant

## 2022-03-21 ENCOUNTER — Ambulatory Visit: Payer: Medicare PPO | Admitting: Family Medicine

## 2022-03-21 ENCOUNTER — Other Ambulatory Visit: Payer: Self-pay

## 2022-03-21 ENCOUNTER — Telehealth: Payer: Self-pay | Admitting: Internal Medicine

## 2022-03-21 DIAGNOSIS — D509 Iron deficiency anemia, unspecified: Secondary | ICD-10-CM

## 2022-03-21 NOTE — Telephone Encounter (Signed)
Patient had EGD last week and Dr. Henrene Pastor asked her to call today to let him know how she was.  She is still not taking the iron and her diarrhea seems to be better.  Her concern is that her iron is low, but the iron pills are evidently causing her the diarrhea.  Please call and advise.  Thank you.

## 2022-03-21 NOTE — Telephone Encounter (Signed)
Refer to hematology ASAP for IV iron.  Thanks

## 2022-03-21 NOTE — Telephone Encounter (Signed)
Patient called would like to know if she should be taking the iron supplement or stop it. Please advise.

## 2022-03-21 NOTE — Telephone Encounter (Signed)
Patient had EGD last week and Dr. Henrene Pastor asked her to call today to let him know how she was.  She is still not taking the iron and her diarrhea seems to be better but she is concerned because she knows her Iron is low. Per OV note IV Iron was mentioned. Do you want pt referred to hematology? Please advise.

## 2022-03-21 NOTE — Telephone Encounter (Signed)
Spoke with pt and let her know she does not need to be taking the Iron supplements as they were causing her diarrhea.

## 2022-03-21 NOTE — Telephone Encounter (Signed)
Referral made to hematology for IV Iron. Pt aware.

## 2022-03-21 NOTE — Telephone Encounter (Signed)
Scheduled appt per 7/18 referral. Pt is aware of appt date and time. Pt is aware to arrive 15 mins prior to appt time and to bring and updated insurance card. Pt is aware of appt location.   

## 2022-03-22 ENCOUNTER — Other Ambulatory Visit: Payer: Self-pay | Admitting: Family Medicine

## 2022-03-22 DIAGNOSIS — Z1231 Encounter for screening mammogram for malignant neoplasm of breast: Secondary | ICD-10-CM

## 2022-03-27 ENCOUNTER — Encounter: Payer: Self-pay | Admitting: Internal Medicine

## 2022-03-29 ENCOUNTER — Other Ambulatory Visit: Payer: Self-pay | Admitting: Internal Medicine

## 2022-03-29 DIAGNOSIS — K259 Gastric ulcer, unspecified as acute or chronic, without hemorrhage or perforation: Secondary | ICD-10-CM

## 2022-04-03 DIAGNOSIS — L0390 Cellulitis, unspecified: Secondary | ICD-10-CM | POA: Diagnosis not present

## 2022-04-03 DIAGNOSIS — T148XXA Other injury of unspecified body region, initial encounter: Secondary | ICD-10-CM | POA: Diagnosis not present

## 2022-04-03 DIAGNOSIS — L97921 Non-pressure chronic ulcer of unspecified part of left lower leg limited to breakdown of skin: Secondary | ICD-10-CM | POA: Diagnosis not present

## 2022-04-06 NOTE — Progress Notes (Signed)
[place] Telephone:([phone]) [phone]   Fax:([phone]) [phone]  CONSULT NOTE  REFERRING PHYSICIAN: Dr. Henrene Pastor  REASON FOR CONSULTATION:  Iron deficiency anemia  HPI Joan [name] is a 76 y.o. female with a past medical history significant for microscopic colitis, hypertension, senile purpura, mitral valve regurgitation, hypothyroidism, sciatica, rheumatoid arthritis, and fibromyalgia is referred to the clinic for iron deficiency anemia.  The patient was being worked up for iron deficiency anemia by her PCP.  Per chart review, the patient's labs from 02/28/2022 showed anemia with a hemoglobin of 10.5, normal MCV 82, low iron saturation of 8%, ferritin on the low end of normal at 15.  The patient was subsequently referred to Dr. Henrene Pastor since she has a history of multiple antral ulcers which was thought to be secondary to NSAIDs. She was found to have this colonoscopy and EGD from 08/07/2019.   Because of her history and worsening anemia, Dr. Henrene Pastor performed repeat upper endoscopy on 03/16/2022 which did not show any signs of bleeding or ulceration except for a mild superficial  prepyloric ulceration.  The patient has a history of chronic diarrhea secondary to microscopic colitis.  Fortunately, the iron supplement that the patient started flared up her already significant baseline diarrhea.  Therefore, Dr. Henrene Pastor recommended that the patient hold her iron supplement due to intolerance.  He referred her to our clinic to consider IV iron.   Dr. Henrene Pastor referred the patient to the clinic for consideration of IV iron.   Overall, the patient is feeling fair except she reports decreased energy over the last month.  She sometimes has dyspnea on exertion.  She denies any lightheadedness, cold intolerance, or syncopal episodes.  She is trying to increase her dietary intake of iron rich food with green leafy vegetables.   Per chart review, the oldest records I have for her CBC's is from 84. She  did not have any evidence of anemia at this time. She had evidence of anemia starting in April 2019 with a hemoglobin 9.2 from 02/2018. This  improved in 2020.  She then had mild anemia with hemoglobin ranging from 10.4 to normal from November 2021-September 2022.  The patient mentions that she had abnormal blood counts with methotrexate use few years ago.  The patient's husband reports that it "almost killed her".  She is on prednisone for the rheumatoid arthritis presently.     She denies ever needing an iron infusion or blood transfusion before. Denies any other history of vitamin deficiencies although she carries a diagnosis of B12 deficiency per chart review.  The patient is not on a blood thinner but does have easy bruising on her upper and lower extremities.  The patient's husband mentions that if she cuts herself it takes a long time for the bleeding to stop.     Regarding other abnormal bleeding, she denies gingival bleeding, hemoptysis, hematemesis, melena, hematochezia, or hematuria.  Denies any chills, night sweats, unexplained weight loss, or lymphadenopathy.  She does continue to take NSAIDs due to her fibromyalgia and rheumatoid arthritis.  She takes naproxen in the morning and Tylenol at night.  She was seen by a nephrologist in the past due to possible chronic kidney disease.  She reports nephrology cleared her from having chronic kidney disease.  Denies any history of any bariatric surgery.   The patient denies any family history of any bone marrow, blood disorders, or cancer.  The patient's mother is 86 years old with hypertension.  The patient's  father passed away at the age of 66 due to myocardial infarction.  The patient's sister passed away this past December 04, 2022 from a suspected aneurysm.  The patient works in [employer].  She is accompanied by her husband today.  She has 2 children.  Denies any drug, alcohol, or tobacco use.    HPI  Past Medical History:  Diagnosis Date   Alopecia  areata 02/16/2006   Anxiety state, unspecified 12/04/02   Benign paroxysmal positional vertigo 04/01/03   Cancer ([place])    skin    Cervicalgia 04/01/03   Chronic rhinitis 09/01/05   Corns and callosities    Dermatophytosis of nail    Diarrhea 04/15/2015   Diverticulosis    Dysphagia, unspecified(787.20) 2002/12/04   Enthesopathy of ankle and tarsus, unspecified 03/31/96   External hemorrhoids without mention of complication    Fibroadenosis of breast 04/01/03   Fibromyalgia    Hypertension 12-04-2002   Hypothyroidism    Impacted cerumen    right ear   Lumbago    Myalgia and myositis, unspecified 03/01/05   Nontoxic uninodular goiter    Osteoarthrosis, unspecified whether generalized or localized, unspecified site 04/01/03   Other left bundle branch block    Rheumatoid arthritis(714.0) 10/05/2004   lumbar spondylosis. Lumbo-sacral anterolisthesis     Senile osteoporosis 04/01/03   Sprain of metatarsophalangeal (joint) of foot    Thyrotoxicosis    Thyrotoxicosis without mention of goiter or other cause, without mention of thyrotoxic crisis or storm    Unspecified vitamin D deficiency    Urinary frequency     Past Surgical History:  Procedure Laterality Date   COLONOSCOPY     COLONOSCOPY N/A 04/15/2015   Procedure: COLONOSCOPY;  Surgeon: Gatha Mayer, MD;  Location: [facility];  Service: Endoscopy;  Laterality: N/A;   DG FINGERS, MULTIPLE LT HAND ([facility] HX)     FOOT SURGERY Bilateral 05/22/2014   Dr.Strom    LUMBAR DISC SURGERY  12/12/2017   Dr Arnoldo Morale   REMOVE GANGLION  1998   DR [facility]    RIGHT WRIST SUGERY FOR TENOSYNOVITIS  12/04/1995   DR SYPHER    TONSILLECTOMY AND ADENOIDECTOMY  1974   TUBAL LIGATION  1973    Family History  Problem Relation Age of Onset   Heart attack Father        age 69   Heart disease Father    Colon cancer Neg Hx    Esophageal cancer Neg Hx    Rectal cancer Neg Hx    Stomach cancer Neg Hx     Social History Social History   Tobacco Use   Smoking status:  Never   Smokeless tobacco: Never  Vaping Use   Vaping Use: Never used  Substance Use Topics   Alcohol use: No    Alcohol/week: 0.0 standard drinks of alcohol   Drug use: No    Allergies  Allergen Reactions   Arava [Leflunomide] Diarrhea, Nausea Only and Other (See Comments)    Loss appetite   Penicillins Rash and Other (See Comments)    PATIENT HAS HAD A PCN REACTION WITH IMMEDIATE RASH, FACIAL/TONGUE/THROAT SWELLING, SOB, OR LIGHTHEADEDNESS WITH HYPOTENSION:  #  #  YES  #  #  Has patient had a PCN reaction causing severe rash involving mucus membranes or skin necrosis: no Has patient had a PCN reaction that required hospitalization: no    Methotrexate Other (See Comments)   Penicillamine Other (See Comments)   Methotrexate Derivatives Other (See Comments)  Hair loss   Neurontin [Gabapentin] Nausea And Vomiting    Current Outpatient Medications  Medication Sig Dispense Refill   acetaminophen (TYLENOL) 650 MG CR tablet Take 1,300 mg by mouth at bedtime.     amitriptyline (ELAVIL) 10 MG tablet TAKE 1 TABLET BY MOUTH AT BEDTIME TO  HELP  RELAX  MUSCLES 90 tablet 1   Biotin 1000 MCG tablet Take 1 tablet (1 mg total) by mouth 2 (two) times daily. 60 tablet 3   budesonide (ENTOCORT EC) 3 MG 24 hr capsule Take 3 capsules (9 mg total) by mouth daily. 270 capsule 3   Calcium Carb-Cholecalciferol (CALTRATE 600+D3) 600-800 MG-UNIT TABS Take 1 tablet by mouth 2 (two) times daily. 60 tablet 11   Cholecalciferol (VITAMIN D3) 50 MCG (2000 UT) capsule Take 1 capsule (2,000 Units total) by mouth daily. 30 capsule 3   cycloSPORINE (RESTASIS) 0.05 % ophthalmic emulsion Place 1 drop into both eyes 2 (two) times daily.     denosumab (PROLIA) 60 MG/ML SOSY injection Inject 60 mg into the skin every 6 (six) months. 1 mL 1   diphenoxylate-atropine (LOMOTIL) 2.5-0.025 MG tablet TAKE 1 TO 2 TABLETS BY MOUTH EVERY 8 HOURS AS NEEDED 60 tablet 6   etanercept (ENBREL) 50 MG/ML injection Inject 50 mg into  the skin once a week.     fluticasone (FLONASE) 50 MCG/ACT nasal spray Place 1 spray into both nostrils daily for 14 days. 1 g 0   folic acid (FOLVITE) 1 MG tablet Take 1 mg by mouth daily.     furosemide (LASIX) 20 MG tablet Take 1 tablet (20 mg total) by mouth daily. 90 tablet 3   Glucosamine-Chondroit-Vit C-Mn (GLUCOSAMINE CHONDR 500 COMPLEX PO) Take 2 tablets by mouth daily.      hydroxychloroquine (PLAQUENIL) 200 MG tablet      losartan (COZAAR) 100 MG tablet Take 1 tablet by mouth once daily 90 tablet 3   Menthol, Topical Analgesic, (ASPERCREME MAX ROLL-ON EX) Apply topically.     minocycline (MINOCIN) 100 MG capsule Take 100 mg by mouth 2 (two) times daily.     Multiple Vitamin (MULTIVITAMIN) tablet Take 1 tablet by mouth daily.      naproxen (NAPROSYN) 500 MG tablet Take 1 tablet (500 mg total) by mouth in the morning and at bedtime. (Patient taking differently: Take 500 mg by mouth daily.) 180 tablet 2   Omega-3 Fatty Acids (FISH OIL) 1000 MG CAPS Take 1,000 mg by mouth daily.      omeprazole (PRILOSEC) 40 MG capsule Take 1 capsule by mouth once daily 90 capsule 6   predniSONE (DELTASONE) 5 MG tablet TAKE 1 TABLET BY MOUTH WITH BREAKFAST 90 tablet 0   Propylene Glycol 0.6 % SOLN Place 1 drop into both eyes 2 (two) times daily as needed (dry eyes).      TIROSINT 50 MCG CAPS Take 1 tablet by mouth 3 (three) times a week.     traMADol (ULTRAM) 50 MG tablet Take 1 tablet (50 mg total) by mouth 2 (two) times daily as needed. 60 tablet 5   tretinoin (RETIN-A) 0.025 % cream tretinoin 0.025 % topical cream  APPLY TO FACE AT BEDTIME     urea (CARMOL) 10 % cream Apply topically as needed.     zinc gluconate 50 MG tablet Take by mouth as needed.     No current facility-administered medications for this visit.    REVIEW OF SYSTEMS:   Review of Systems  Constitutional: Positive for fatigue.  Negative for appetite change, chills, fever and unexpected weight change.  HENT:  Negative for mouth  sores, nosebleeds, sore throat and trouble swallowing.   Eyes: Negative for eye problems and icterus.  Respiratory: Positive for occasional mild dyspnea on exertion.  Negative for cough, hemoptysis, and wheezing.   Cardiovascular: Positive for intermittent leg swelling (Rx Lasix follows Dr. Marlou Porch from cardiology). negative for chest pain.  Gastrointestinal: Negative for abdominal pain, constipation, diarrhea, nausea and vomiting.  Genitourinary: Negative for bladder incontinence, difficulty urinating, dysuria, frequency and hematuria.   Musculoskeletal: Negative for back pain, gait problem, neck pain and neck stiffness.  Skin: Negative for itching and rash.  Neurological: Negative for dizziness, extremity weakness, gait problem, headaches, light-headedness and seizures.  Hematological: Negative for adenopathy.  Reports easy bruising and bleeding. Psychiatric/Behavioral: Negative for confusion, depression and sleep disturbance. The patient is not nervous/anxious.     PHYSICAL EXAMINATION:  Blood pressure (!) 159/69, pulse 78, temperature (!) 97.5 F (36.4 C), resp. rate 18, weight 95 lb 14.4 oz (43.5 kg), SpO2 98 %.  ECOG PERFORMANCE STATUS: 1  Physical Exam  Constitutional: Oriented to person, place, and time and well-developed, well-nourished, and in no distress.  HENT:  Head: Normocephalic and atraumatic.  Mouth/Throat: Oropharynx is clear and moist. No oropharyngeal exudate.  Eyes: Conjunctivae are normal. Right eye exhibits no discharge. Left eye exhibits no discharge. No scleral icterus.  Neck: Normal range of motion. Neck supple.  Cardiovascular: Normal rate, regular rhythm, murmur noted.  s and intact distal pulses.   Pulmonary/Chest: Effort normal and breath sounds normal. No respiratory distress. No wheezes. No rales.  Abdominal: Soft. Bowel sounds are normal. Exhibits no distension and no mass. There is no tenderness.  Musculoskeletal: Normal range of motion.  Mild lower  extremity edema. Lymphadenopathy:    No cervical adenopathy.  Neurological: Alert and oriented to person, place, and time. Exhibits normal muscle tone. Gait normal. Coordination normal.  Skin: Skin is warm and dry.  Senile purpura noted on exam.  No rash noted. Not diaphoretic. No erythema. No pallor.  Psychiatric: Mood, memory and judgment normal.  Vitals reviewed.  LABORATORY DATA: Lab Results  Component Value Date   WBC 13.5 (H) 04/11/2022   HGB 10.7 (L) 04/11/2022   HCT 33.2 (L) 04/11/2022   MCV 85.6 04/11/2022   PLT 338 04/11/2022      Chemistry      Component Value Date/Time   NA 138 04/11/2022 1309   NA 143 02/02/2016 0840   K 5.1 04/11/2022 1309   CL 105 04/11/2022 1309   CO2 28 04/11/2022 1309   BUN 39 (H) 04/11/2022 1309   BUN 13 02/02/2016 0840   CREATININE 1.10 (H) 04/11/2022 1309   CREATININE 0.97 (H) 08/03/2020 0806      Component Value Date/Time   CALCIUM 8.5 (L) 04/11/2022 1309   ALKPHOS 47 04/11/2022 1309   AST 14 (L) 04/11/2022 1309   ALT 11 04/11/2022 1309   BILITOT 0.3 04/11/2022 1309       RADIOGRAPHIC STUDIES: No results found.  ASSESSMENT: This is a very pleasant 76 year old Caucasian female referred to clinic for iron deficiency anemia.  She has intolerance to iron supplements as it flares up her microscopic colitis.  The patient had several lab studies performed today including a repeat CBC, CMP, iron studies, ferritin, vitamin B12, SPEP with immunofixation, and folate.  Her labs show: Continued anemia with a hemoglobin of 10.7.  Her iron saturation is low at 10%.  Her total  iron is on the low end of normal.  Due to the patient's intolerance to iron supplements, we will arrange for IV iron infusions with Venofer 300 mg weekly x3.   She was encouraged to increase her dietary intake of iron rich food. She was given a handout of iron rich food.  We will see her back for follow-up visit in 3 months for evaluation and repeat CBC, iron  studies, and ferritin.   The patient does have senile purpura on exam.  Because of the concern for easy bruising and prolonged bleeding after a cut, we will arrange for the patient to have a lab appointment next week to check her PT/aptt.   The patient voices understanding of current disease status and treatment options and is in agreement with the current care plan.  All questions were answered. The patient knows to call the clinic with any problems, questions or concerns. We can certainly see the patient much sooner if necessary.  Thank you so much for allowing me to participate in the care of Joan [name]. I will continue to follow up the patient with you and assist in her care.   Disclaimer: This note was dictated with voice recognition software. Similar sounding words can inadvertently be transcribed and may not be corrected upon review.   Miliana Gangwer L Bertine Schlottman April 11, 2022, 1:38 PM  ADDENDUM: Hematology/Oncology Attending: I had a face-to-face encounter with the patient today.  I reviewed her records, lab and recommended her care plan.  This is a very pleasant 76 years old white female with history of microscopic colitis in addition to senile purpura, mitral valve regurgitation, hypothyroidism, rheumatoid arthritis, fibromyalgia as well as hypertension.  The patient was seen recently by her primary care physician for routine evaluation and she was found to have persistent anemia with hemoglobin down to 10.5.  Her serum iron saturation was 8% and ferritin level was 15.  The patient was seen in the past by gastroenterology and was found to have some antral ulcers secondary to NSAIDs and she discontinued that since that time.  Her most recent upper endoscopy showed no concerning findings except for mild superficial peripyloric ulceration.  The patient also has history of chronic diarrhea secondary to microscopic colitis.  She has intolerance to the oral iron tablets.  She was  referred to [place] today for evaluation and recommendation regarding her anemia and also consideration of IV iron infusion. When seen today she continues to have mild fatigue.  She has no current nausea, vomiting, diarrhea or constipation. Repeat blood work today showed hemoglobin of 10.7 and hematocrit 33.2 with a slightly elevated white blood count of 13.5.  Serum iron was low normal at 36 with iron saturation of 10%.  She had elevated vitamin B12 level of 4655 and serum folate over 40.  Ferritin level was low at 9. I recommended for the patient to proceed with iron infusion with Venofer 300 Mg IV weekly for 3 weeks. Will see the patient back for follow-up visit in 3 months for evaluation and repeat blood work. The patient was advised to call immediately if she has any concerning symptoms in the interval. The total time spent in the appointment was 60 minutes. Disclaimer: This note was dictated with voice recognition software. Similar sounding words can inadvertently be transcribed and may be missed upon review. Eilleen Kempf, MD

## 2022-04-07 ENCOUNTER — Other Ambulatory Visit: Payer: Self-pay

## 2022-04-07 ENCOUNTER — Other Ambulatory Visit: Payer: Self-pay | Admitting: Physician Assistant

## 2022-04-07 DIAGNOSIS — D509 Iron deficiency anemia, unspecified: Secondary | ICD-10-CM

## 2022-04-11 ENCOUNTER — Other Ambulatory Visit: Payer: Self-pay

## 2022-04-11 ENCOUNTER — Inpatient Hospital Stay: Payer: Medicare PPO | Attending: Physician Assistant | Admitting: Physician Assistant

## 2022-04-11 ENCOUNTER — Inpatient Hospital Stay: Payer: Medicare PPO

## 2022-04-11 VITALS — BP 159/69 | HR 78 | Temp 97.5°F | Resp 18 | Wt 95.9 lb

## 2022-04-11 DIAGNOSIS — Z791 Long term (current) use of non-steroidal anti-inflammatories (NSAID): Secondary | ICD-10-CM | POA: Diagnosis not present

## 2022-04-11 DIAGNOSIS — D692 Other nonthrombocytopenic purpura: Secondary | ICD-10-CM | POA: Diagnosis not present

## 2022-04-11 DIAGNOSIS — E039 Hypothyroidism, unspecified: Secondary | ICD-10-CM | POA: Insufficient documentation

## 2022-04-11 DIAGNOSIS — Z79899 Other long term (current) drug therapy: Secondary | ICD-10-CM | POA: Diagnosis not present

## 2022-04-11 DIAGNOSIS — M47816 Spondylosis without myelopathy or radiculopathy, lumbar region: Secondary | ICD-10-CM | POA: Diagnosis not present

## 2022-04-11 DIAGNOSIS — Z7952 Long term (current) use of systemic steroids: Secondary | ICD-10-CM | POA: Insufficient documentation

## 2022-04-11 DIAGNOSIS — M069 Rheumatoid arthritis, unspecified: Secondary | ICD-10-CM | POA: Insufficient documentation

## 2022-04-11 DIAGNOSIS — R350 Frequency of micturition: Secondary | ICD-10-CM | POA: Diagnosis not present

## 2022-04-11 DIAGNOSIS — D509 Iron deficiency anemia, unspecified: Secondary | ICD-10-CM | POA: Insufficient documentation

## 2022-04-11 DIAGNOSIS — Z8249 Family history of ischemic heart disease and other diseases of the circulatory system: Secondary | ICD-10-CM | POA: Diagnosis not present

## 2022-04-11 DIAGNOSIS — M797 Fibromyalgia: Secondary | ICD-10-CM | POA: Diagnosis not present

## 2022-04-11 DIAGNOSIS — E049 Nontoxic goiter, unspecified: Secondary | ICD-10-CM | POA: Insufficient documentation

## 2022-04-11 DIAGNOSIS — K259 Gastric ulcer, unspecified as acute or chronic, without hemorrhage or perforation: Secondary | ICD-10-CM | POA: Insufficient documentation

## 2022-04-11 DIAGNOSIS — Z8711 Personal history of peptic ulcer disease: Secondary | ICD-10-CM | POA: Diagnosis not present

## 2022-04-11 DIAGNOSIS — R233 Spontaneous ecchymoses: Secondary | ICD-10-CM

## 2022-04-11 DIAGNOSIS — E559 Vitamin D deficiency, unspecified: Secondary | ICD-10-CM | POA: Diagnosis not present

## 2022-04-11 DIAGNOSIS — Z7983 Long term (current) use of bisphosphonates: Secondary | ICD-10-CM | POA: Insufficient documentation

## 2022-04-11 DIAGNOSIS — T454X5S Adverse effect of iron and its compounds, sequela: Secondary | ICD-10-CM | POA: Insufficient documentation

## 2022-04-11 DIAGNOSIS — M810 Age-related osteoporosis without current pathological fracture: Secondary | ICD-10-CM | POA: Diagnosis not present

## 2022-04-11 DIAGNOSIS — E538 Deficiency of other specified B group vitamins: Secondary | ICD-10-CM | POA: Insufficient documentation

## 2022-04-11 LAB — CBC WITH DIFFERENTIAL (CANCER CENTER ONLY)
Abs Immature Granulocytes: 0.07 10*3/uL (ref 0.00–0.07)
Basophils Absolute: 0.1 10*3/uL (ref 0.0–0.1)
Basophils Relative: 0 %
Eosinophils Absolute: 0 10*3/uL (ref 0.0–0.5)
Eosinophils Relative: 0 %
HCT: 33.2 % — ABNORMAL LOW (ref 36.0–46.0)
Hemoglobin: 10.7 g/dL — ABNORMAL LOW (ref 12.0–15.0)
Immature Granulocytes: 1 %
Lymphocytes Relative: 11 %
Lymphs Abs: 1.4 10*3/uL (ref 0.7–4.0)
MCH: 27.6 pg (ref 26.0–34.0)
MCHC: 32.2 g/dL (ref 30.0–36.0)
MCV: 85.6 fL (ref 80.0–100.0)
Monocytes Absolute: 0.5 10*3/uL (ref 0.1–1.0)
Monocytes Relative: 4 %
Neutro Abs: 11.4 10*3/uL — ABNORMAL HIGH (ref 1.7–7.7)
Neutrophils Relative %: 84 %
Platelet Count: 338 10*3/uL (ref 150–400)
RBC: 3.88 MIL/uL (ref 3.87–5.11)
RDW: 16.4 % — ABNORMAL HIGH (ref 11.5–15.5)
WBC Count: 13.5 10*3/uL — ABNORMAL HIGH (ref 4.0–10.5)
nRBC: 0 % (ref 0.0–0.2)

## 2022-04-11 LAB — CMP (CANCER CENTER ONLY)
ALT: 11 U/L (ref 0–44)
AST: 14 U/L — ABNORMAL LOW (ref 15–41)
Albumin: 3.7 g/dL (ref 3.5–5.0)
Alkaline Phosphatase: 47 U/L (ref 38–126)
Anion gap: 5 (ref 5–15)
BUN: 39 mg/dL — ABNORMAL HIGH (ref 8–23)
CO2: 28 mmol/L (ref 22–32)
Calcium: 8.5 mg/dL — ABNORMAL LOW (ref 8.9–10.3)
Chloride: 105 mmol/L (ref 98–111)
Creatinine: 1.1 mg/dL — ABNORMAL HIGH (ref 0.44–1.00)
GFR, Estimated: 53 mL/min — ABNORMAL LOW (ref >60)
Glucose, Bld: 97 mg/dL (ref 70–99)
Potassium: 5.1 mmol/L (ref 3.5–5.1)
Sodium: 138 mmol/L (ref 135–145)
Total Bilirubin: 0.3 mg/dL (ref 0.3–1.2)
Total Protein: 6.6 g/dL (ref 6.5–8.1)

## 2022-04-11 LAB — IRON AND IRON BINDING CAPACITY (CC-WL,HP ONLY)
Iron: 36 ug/dL (ref 28–170)
Saturation Ratios: 10 % — ABNORMAL LOW (ref 10.4–31.8)
TIBC: 378 ug/dL (ref 250–450)
UIBC: 342 ug/dL (ref 148–442)

## 2022-04-11 LAB — VITAMIN B12: Vitamin B-12: 4655 pg/mL — ABNORMAL HIGH (ref 180–914)

## 2022-04-11 LAB — FERRITIN: Ferritin: 9 ng/mL — ABNORMAL LOW (ref 11–307)

## 2022-04-11 LAB — FOLATE: Folate: >40 ng/mL (ref >5.9)

## 2022-04-13 LAB — PROTEIN ELECTROPHORESIS, SERUM, WITH REFLEX
A/G Ratio: 1.2 (ref 0.7–1.7)
Albumin ELP: 3.3 g/dL (ref 2.9–4.4)
Alpha-1-Globulin: 0.3 g/dL (ref 0.0–0.4)
Alpha-2-Globulin: 0.8 g/dL (ref 0.4–1.0)
Beta Globulin: 0.8 g/dL (ref 0.7–1.3)
Gamma Globulin: 0.9 g/dL (ref 0.4–1.8)
Globulin, Total: 2.8 g/dL (ref 2.2–3.9)
Total Protein ELP: 6.1 g/dL (ref 6.0–8.5)

## 2022-04-14 ENCOUNTER — Telehealth: Payer: Self-pay | Admitting: Physician Assistant

## 2022-04-14 NOTE — Telephone Encounter (Signed)
I called the patient to review the results of her pending lab studies from her appointment this week. Besides the iron deficiency, her other lab work was normal. She is taking a B12 supplement which caused her B12 to be very high. I called her and reviewed the results. I let her know she can discontinue the B12 supplement. We also reviewed her appointments. She was appreciative of the call.

## 2022-04-17 DIAGNOSIS — R600 Localized edema: Secondary | ICD-10-CM | POA: Diagnosis not present

## 2022-04-17 DIAGNOSIS — D509 Iron deficiency anemia, unspecified: Secondary | ICD-10-CM | POA: Diagnosis not present

## 2022-04-17 DIAGNOSIS — N1831 Chronic kidney disease, stage 3a: Secondary | ICD-10-CM | POA: Diagnosis not present

## 2022-04-17 DIAGNOSIS — Z681 Body mass index (BMI) 19 or less, adult: Secondary | ICD-10-CM | POA: Diagnosis not present

## 2022-04-18 ENCOUNTER — Ambulatory Visit: Payer: Medicare PPO | Admitting: Family Medicine

## 2022-04-20 ENCOUNTER — Other Ambulatory Visit: Payer: Self-pay

## 2022-04-21 ENCOUNTER — Other Ambulatory Visit: Payer: Self-pay | Admitting: Physician Assistant

## 2022-04-21 ENCOUNTER — Inpatient Hospital Stay: Payer: Medicare PPO

## 2022-04-21 ENCOUNTER — Other Ambulatory Visit: Payer: Self-pay

## 2022-04-21 VITALS — BP 149/68 | HR 67 | Temp 97.8°F | Resp 18

## 2022-04-21 DIAGNOSIS — D509 Iron deficiency anemia, unspecified: Secondary | ICD-10-CM | POA: Diagnosis not present

## 2022-04-21 DIAGNOSIS — D692 Other nonthrombocytopenic purpura: Secondary | ICD-10-CM | POA: Diagnosis not present

## 2022-04-21 DIAGNOSIS — E049 Nontoxic goiter, unspecified: Secondary | ICD-10-CM | POA: Diagnosis not present

## 2022-04-21 DIAGNOSIS — M797 Fibromyalgia: Secondary | ICD-10-CM | POA: Diagnosis not present

## 2022-04-21 DIAGNOSIS — E538 Deficiency of other specified B group vitamins: Secondary | ICD-10-CM | POA: Diagnosis not present

## 2022-04-21 DIAGNOSIS — E039 Hypothyroidism, unspecified: Secondary | ICD-10-CM | POA: Diagnosis not present

## 2022-04-21 DIAGNOSIS — R233 Spontaneous ecchymoses: Secondary | ICD-10-CM

## 2022-04-21 DIAGNOSIS — K259 Gastric ulcer, unspecified as acute or chronic, without hemorrhage or perforation: Secondary | ICD-10-CM | POA: Diagnosis not present

## 2022-04-21 DIAGNOSIS — M47816 Spondylosis without myelopathy or radiculopathy, lumbar region: Secondary | ICD-10-CM | POA: Diagnosis not present

## 2022-04-21 DIAGNOSIS — M069 Rheumatoid arthritis, unspecified: Secondary | ICD-10-CM | POA: Diagnosis not present

## 2022-04-21 LAB — CBC WITH DIFFERENTIAL (CANCER CENTER ONLY)
Abs Immature Granulocytes: 0.08 10*3/uL — ABNORMAL HIGH (ref 0.00–0.07)
Basophils Absolute: 0 10*3/uL (ref 0.0–0.1)
Basophils Relative: 0 %
Eosinophils Absolute: 0.2 10*3/uL (ref 0.0–0.5)
Eosinophils Relative: 1 %
HCT: 34.1 % — ABNORMAL LOW (ref 36.0–46.0)
Hemoglobin: 11.1 g/dL — ABNORMAL LOW (ref 12.0–15.0)
Immature Granulocytes: 1 %
Lymphocytes Relative: 29 %
Lymphs Abs: 3.4 10*3/uL (ref 0.7–4.0)
MCH: 27.8 pg (ref 26.0–34.0)
MCHC: 32.6 g/dL (ref 30.0–36.0)
MCV: 85.5 fL (ref 80.0–100.0)
Monocytes Absolute: 1.4 10*3/uL — ABNORMAL HIGH (ref 0.1–1.0)
Monocytes Relative: 12 %
Neutro Abs: 6.7 10*3/uL (ref 1.7–7.7)
Neutrophils Relative %: 57 %
Platelet Count: 303 10*3/uL (ref 150–400)
RBC: 3.99 MIL/uL (ref 3.87–5.11)
RDW: 16.2 % — ABNORMAL HIGH (ref 11.5–15.5)
WBC Count: 11.8 10*3/uL — ABNORMAL HIGH (ref 4.0–10.5)
nRBC: 0 % (ref 0.0–0.2)

## 2022-04-21 LAB — IRON AND IRON BINDING CAPACITY (CC-WL,HP ONLY)
Iron: 75 ug/dL (ref 28–170)
Saturation Ratios: 20 % (ref 10.4–31.8)
TIBC: 384 ug/dL (ref 250–450)
UIBC: 309 ug/dL (ref 148–442)

## 2022-04-21 LAB — PROTIME-INR
INR: 1 (ref 0.8–1.2)
Prothrombin Time: 13 seconds (ref 11.4–15.2)

## 2022-04-21 LAB — APTT: aPTT: 25 seconds (ref 24–36)

## 2022-04-21 MED ORDER — SODIUM CHLORIDE 0.9 % IV SOLN
300.0000 mg | Freq: Once | INTRAVENOUS | Status: AC
  Administered 2022-04-21: 300 mg via INTRAVENOUS
  Filled 2022-04-21: qty 300

## 2022-04-21 MED ORDER — LORATADINE 10 MG PO TABS
ORAL_TABLET | ORAL | Status: AC
  Filled 2022-04-21: qty 1

## 2022-04-21 MED ORDER — LORATADINE 10 MG PO TABS
10.0000 mg | ORAL_TABLET | Freq: Once | ORAL | Status: AC
  Administered 2022-04-21: 10 mg via ORAL

## 2022-04-21 MED ORDER — SODIUM CHLORIDE 0.9 % IV SOLN: Freq: Once | INTRAVENOUS | Status: AC

## 2022-04-21 MED ORDER — ACETAMINOPHEN 325 MG PO TABS
650.0000 mg | ORAL_TABLET | Freq: Once | ORAL | Status: AC
  Administered 2022-04-21: 650 mg via ORAL
  Filled 2022-04-21: qty 2

## 2022-04-21 MED ORDER — DIPHENHYDRAMINE HCL 25 MG PO CAPS
50.0000 mg | ORAL_CAPSULE | Freq: Once | ORAL | Status: DC
  Filled 2022-04-21: qty 2

## 2022-04-21 NOTE — Patient Instructions (Signed)

## 2022-04-21 NOTE — Progress Notes (Signed)
BP (!) 149/68 (BP Location: Right Arm, Patient Position: Sitting)   Pulse 67   Temp 97.8 F (36.6 C) (Oral)   Resp 18   SpO2 100%  Patient tolerated her iron well today- no reaction issues. Received claritin instead of benadryl due to potential for sleepiness. !st time- stayed for the 30 minute observation.

## 2022-04-25 ENCOUNTER — Telehealth: Payer: Self-pay

## 2022-04-25 NOTE — Telephone Encounter (Signed)
I have spoken with the pts husband, Joan [name], and advised as indicated. Joan [name] expressed understanding of this information.

## 2022-04-25 NOTE — Telephone Encounter (Signed)
-----   Message from [employer], PA-C sent at 04/25/2022  8:37 AM EDT ----- Regarding: FW: Her husband was worried about how easy she bruises. Can you call him and let her know her blood clots normally. ----- Message ----- From: Interface, Lab In [place] Sent: 04/21/2022   8:13 AM EDT To: Tobe Sos Heilingoetter, PA-C

## 2022-04-26 ENCOUNTER — Ambulatory Visit: Payer: Medicare PPO | Admitting: Internal Medicine

## 2022-04-28 ENCOUNTER — Inpatient Hospital Stay: Payer: Medicare PPO

## 2022-04-28 ENCOUNTER — Other Ambulatory Visit: Payer: Self-pay

## 2022-04-28 VITALS — BP 156/73 | HR 64 | Temp 98.3°F | Resp 17

## 2022-04-28 DIAGNOSIS — M797 Fibromyalgia: Secondary | ICD-10-CM | POA: Diagnosis not present

## 2022-04-28 DIAGNOSIS — E049 Nontoxic goiter, unspecified: Secondary | ICD-10-CM | POA: Diagnosis not present

## 2022-04-28 DIAGNOSIS — D509 Iron deficiency anemia, unspecified: Secondary | ICD-10-CM

## 2022-04-28 DIAGNOSIS — D692 Other nonthrombocytopenic purpura: Secondary | ICD-10-CM | POA: Diagnosis not present

## 2022-04-28 DIAGNOSIS — K259 Gastric ulcer, unspecified as acute or chronic, without hemorrhage or perforation: Secondary | ICD-10-CM | POA: Diagnosis not present

## 2022-04-28 DIAGNOSIS — M069 Rheumatoid arthritis, unspecified: Secondary | ICD-10-CM | POA: Diagnosis not present

## 2022-04-28 DIAGNOSIS — E039 Hypothyroidism, unspecified: Secondary | ICD-10-CM | POA: Diagnosis not present

## 2022-04-28 DIAGNOSIS — E538 Deficiency of other specified B group vitamins: Secondary | ICD-10-CM | POA: Diagnosis not present

## 2022-04-28 DIAGNOSIS — M47816 Spondylosis without myelopathy or radiculopathy, lumbar region: Secondary | ICD-10-CM | POA: Diagnosis not present

## 2022-04-28 MED ORDER — SODIUM CHLORIDE 0.9 % IV SOLN
300.0000 mg | Freq: Once | INTRAVENOUS | Status: AC
  Administered 2022-04-28: 300 mg via INTRAVENOUS
  Filled 2022-04-28: qty 300

## 2022-04-28 MED ORDER — DIPHENHYDRAMINE HCL 25 MG PO CAPS: 25.0000 mg | ORAL_CAPSULE | Freq: Once | ORAL | Status: DC

## 2022-04-28 MED ORDER — LORATADINE 10 MG PO TABS
10.0000 mg | ORAL_TABLET | Freq: Once | ORAL | Status: AC
  Administered 2022-04-28: 10 mg via ORAL
  Filled 2022-04-28: qty 1

## 2022-04-28 MED ORDER — SODIUM CHLORIDE 0.9 % IV SOLN: Freq: Once | INTRAVENOUS | Status: AC

## 2022-04-28 MED ORDER — ACETAMINOPHEN 325 MG PO TABS
650.0000 mg | ORAL_TABLET | Freq: Once | ORAL | Status: AC
  Administered 2022-04-28: 650 mg via ORAL
  Filled 2022-04-28: qty 2

## 2022-04-28 MED ORDER — LORATADINE 10 MG PO TABS: 10.0000 mg | ORAL_TABLET | Freq: Every day | ORAL | Status: DC

## 2022-04-28 NOTE — Patient Instructions (Signed)

## 2022-05-02 ENCOUNTER — Ambulatory Visit: Payer: Medicare PPO | Admitting: Family Medicine

## 2022-05-02 ENCOUNTER — Encounter: Payer: Self-pay | Admitting: Family Medicine

## 2022-05-02 VITALS — BP 132/72 | HR 67 | Temp 96.7°F | Ht <= 58 in | Wt 97.4 lb

## 2022-05-02 DIAGNOSIS — I10 Essential (primary) hypertension: Secondary | ICD-10-CM

## 2022-05-02 DIAGNOSIS — M25519 Pain in unspecified shoulder: Secondary | ICD-10-CM

## 2022-05-02 DIAGNOSIS — M25512 Pain in left shoulder: Secondary | ICD-10-CM | POA: Diagnosis not present

## 2022-05-02 MED ORDER — METHYLPREDNISOLONE ACETATE 80 MG/ML IJ SUSP
80.0000 mg | Freq: Once | INTRAMUSCULAR | Status: AC
  Administered 2022-05-02: 80 mg via INTRA_ARTICULAR

## 2022-05-02 NOTE — Progress Notes (Signed)
Provider:  Alain Honey, MD  Careteam: Patient Care Team: Wardell Honour, MD as PCP - General (Family Medicine) Jerline Pain, MD as PCP - Cardiology (Cardiology) Hennie Duos, MD as Consulting Physician (Rheumatology) Altheimer, Legrand Como, MD as Consulting Physician (Endocrinology) Newman Pies, MD as Consulting Physician (Neurosurgery) Clydell Hakim, MD (Inactive) as Consulting Physician (Anesthesiology) Jola Baptist, Madie Reno, MD (Inactive) as Consulting Physician (Diagnostic Radiology) Jolene Schimke, MD as Referring Physician (Dermatology) Lynnell Dike, OD as Consulting Physician (Optometry)  PLACE OF SERVICE:  [place] Directive information    Allergies  Allergen Reactions   Arava [Leflunomide] Diarrhea, Nausea Only and Other (See Comments)    Loss appetite   Penicillins Rash and Other (See Comments)    PATIENT HAS HAD A PCN REACTION WITH IMMEDIATE RASH, FACIAL/TONGUE/THROAT SWELLING, SOB, OR LIGHTHEADEDNESS WITH HYPOTENSION:  #  #  YES  #  #  Has patient had a PCN reaction causing severe rash involving mucus membranes or skin necrosis: no Has patient had a PCN reaction that required hospitalization: no    Methotrexate Other (See Comments)   Penicillamine Other (See Comments)   Methotrexate Derivatives Other (See Comments)    Hair loss   Neurontin [Gabapentin] Nausea And Vomiting    Chief Complaint  Patient presents with   Medical Management of Chronic Issues    Patient presents today for a 6 month follow-up.   Quality Metric Gaps    COVID #4     HPI: Patient is a 76 y.o. female patient is here today to follow-up with her left shoulder pain.  Trying to get her to localize her pain it is general but seems to be worse lateral.  She does take Enbrel from her rheumatologist as well as tramadol for pain.  Review of Systems:  Review of Systems  Constitutional: Negative.   Respiratory: Negative.    Cardiovascular: Negative.    Musculoskeletal:  Positive for back pain and joint pain.  All other systems reviewed and are negative.   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 Lumbo-sacral anterolisthesis     Senile osteoporosis 04/01/03   Sprain of metatarsophalangeal (joint) of foot    Thyrotoxicosis    Thyrotoxicosis without mention of goiter or other cause, without mention of thyrotoxic crisis or storm    Unspecified vitamin D deficiency    Urinary frequency    Past Surgical History:  Procedure Laterality Date   COLONOSCOPY     COLONOSCOPY N/A 04/15/2015   Procedure: COLONOSCOPY;  Surgeon: Gatha Mayer, MD;  Location: [facility];  Service: Endoscopy;  Laterality: N/A;  DG FINGERS, MULTIPLE LT HAND ([facility] HX)     FOOT SURGERY Bilateral 05/22/2014   Dr.Strom    LUMBAR DISC SURGERY  12/12/2017   Dr Arnoldo Morale   REMOVE GANGLION  1998   DR [facility]    RIGHT WRIST SUGERY FOR TENOSYNOVITIS  1997   DR [place]   TUBAL LIGATION  1973   Social History:   reports that she has never smoked. She has never used smokeless tobacco. She reports that she does not drink  alcohol and does not use drugs.  Family History  Problem Relation Age of Onset   Heart attack Father        age 76   Heart disease Father    Colon cancer Neg Hx    Esophageal cancer Neg Hx    Rectal cancer Neg Hx    Stomach cancer Neg Hx     Medications: Patient's Medications  New Prescriptions   No medications on file  Previous Medications   ACETAMINOPHEN (TYLENOL) 650 MG CR TABLET    Take 1,300 mg by mouth at bedtime.   AMITRIPTYLINE (ELAVIL) 10 MG TABLET    TAKE 1 TABLET BY MOUTH AT BEDTIME TO  HELP  RELAX  MUSCLES   BIOTIN 1000 MCG TABLET    Take 1 tablet (1 mg total) by mouth 2 (two) times daily.   BUDESONIDE (ENTOCORT EC) 3 MG 24 HR CAPSULE    Take 3 capsules (9 mg total) by mouth daily.   CALCIUM CARB-CHOLECALCIFEROL (CALTRATE 600+D3) 600-800 MG-UNIT TABS    Take 1 tablet by mouth 2 (two) times daily.   CHOLECALCIFEROL (VITAMIN D3) 50 MCG (2000 UT) CAPSULE    Take 1 capsule (2,000 Units total) by mouth daily.   CYCLOSPORINE (RESTASIS) 0.05 % OPHTHALMIC EMULSION    Place 1 drop into both eyes 2 (two) times daily.   DENOSUMAB (PROLIA) 60 MG/ML SOSY INJECTION    Inject 60 mg into the skin every 6 (six) months.   DIPHENOXYLATE-ATROPINE (LOMOTIL) 2.5-0.025 MG TABLET    TAKE 1 TO 2 TABLETS BY MOUTH EVERY 8 HOURS AS NEEDED   ETANERCEPT (ENBREL) 50 MG/ML INJECTION    Inject 50 mg into the skin once a week.   FLUTICASONE (FLONASE) 50 MCG/ACT NASAL SPRAY    Place 1 spray into both nostrils daily for 14 days.   FOLIC ACID (FOLVITE) 1 MG TABLET    Take 1 mg by mouth daily.   FUROSEMIDE (LASIX) 20 MG TABLET    Take 1 tablet (20 mg total) by mouth daily.   GLUCOSAMINE-CHONDROIT-VIT C-MN (GLUCOSAMINE CHONDR 500 COMPLEX PO)    Take 2 tablets by mouth daily.    HYDROXYCHLOROQUINE (PLAQUENIL) 200 MG TABLET       LOSARTAN (COZAAR) 100 MG TABLET    Take 1 tablet by mouth once daily   MENTHOL, TOPICAL ANALGESIC, (ASPERCREME MAX ROLL-ON EX)    Apply topically.   MINOCYCLINE (MINOCIN) 100 MG  CAPSULE    Take 100 mg by mouth 2 (two) times daily.   MULTIPLE VITAMIN (MULTIVITAMIN) TABLET    Take 1 tablet by mouth daily.    NAPROXEN (NAPROSYN) 500 MG TABLET    Take 1 tablet (500 mg total) by mouth in the morning and at bedtime.   OMEGA-3 FATTY ACIDS (FISH OIL) 1000 MG CAPS    Take 1,000 mg by mouth daily.    OMEPRAZOLE (PRILOSEC) 40 MG CAPSULE    Take 1 capsule by mouth once  daily   PREDNISONE (DELTASONE) 5 MG TABLET    TAKE 1 TABLET BY MOUTH WITH BREAKFAST   PROPYLENE GLYCOL 0.6 % SOLN    Place 1 drop into both eyes 2 (two) times daily as needed (dry eyes).    TIROSINT 50 MCG CAPS    Take 1 tablet by mouth 3 (three) times a week.   TRAMADOL (ULTRAM) 50 MG TABLET    Take 1 tablet (50 mg total) by mouth 2 (two) times daily as needed.   TRETINOIN (RETIN-A) 0.025 % CREAM    tretinoin 0.025 % topical cream  APPLY TO FACE AT BEDTIME   UREA (CARMOL) 10 % CREAM    Apply topically as needed.   ZINC GLUCONATE 50 MG TABLET    Take by mouth as needed.  Modified Medications   No medications on file  Discontinued Medications   No medications on file    Physical Exam:  Vitals:   05/02/22 0924  BP: 132/72  Pulse: 67  Temp: (!) 96.7 F (35.9 C)  SpO2: 97%  Weight: 97 lb 6.4 oz (44.2 kg)  Height: '4\' 6"'$  (1.372 m)   Body mass index is 23.48 kg/m. Wt Readings from Last 3 Encounters:  05/02/22 97 lb 6.4 oz (44.2 kg)  04/11/22 95 lb 14.4 oz (43.5 kg)  03/16/22 98 lb (44.5 kg)    Physical Exam Vitals and nursing note reviewed.  Constitutional:      Appearance: Normal appearance.  Cardiovascular:     Rate and Rhythm: Normal rate.  Pulmonary:     Effort: Pulmonary effort is normal.  Musculoskeletal:     Comments: Left shoulder shows increased pain laterally with abduction.  Seems to be most tender over the by simple as well as deltoid tendon as well as subacromial bursa. Point of maximal tenderness was prepped with alcohol anesthetized with ethyl chloride and injected with 40 mg  Depo-Medrol and Marcaine  Neurological:     Mental Status: She is alert.     Labs reviewed: Basic Metabolic Panel: Recent Labs    04/11/22 1309  NA 138  K 5.1  CL 105  CO2 28  GLUCOSE 97  BUN 39*  CREATININE 1.10*  CALCIUM 8.5*   Liver Function Tests: Recent Labs    04/11/22 1309  AST 14*  ALT 11  ALKPHOS 47  BILITOT 0.3  PROT 6.6  ALBUMIN 3.7   No results for input(s): "LIPASE", "AMYLASE" in the last 8760 hours. No results for input(s): "AMMONIA" in the last 8760 hours. CBC: Recent Labs    04/11/22 1309 04/21/22 0745  WBC 13.5* 11.8*  NEUTROABS 11.4* 6.7  HGB 10.7* 11.1*  HCT 33.2* 34.1*  MCV 85.6 85.5  PLT 338 303   Lipid Panel: No results for input(s): "CHOL", "HDL", "LDLCALC", "TRIG", "CHOLHDL", "LDLDIRECT" in the last 8760 hours. TSH: No results for input(s): "TSH" in the last 8760 hours. A1C: Lab Results  Component Value Date   HGBA1C 5.2 11/17/2019     Assessment/Plan    Alain Honey, MD [place] [phone]

## 2022-05-03 LAB — BASIC METABOLIC PANEL WITH GFR
BUN/Creatinine Ratio: 27 (calc) — ABNORMAL HIGH (ref 6–22)
BUN: 30 mg/dL — ABNORMAL HIGH (ref 7–25)
CO2: 27 mmol/L (ref 20–32)
Calcium: 9.2 mg/dL (ref 8.6–10.4)
Chloride: 105 mmol/L (ref 98–110)
Creat: 1.12 mg/dL — ABNORMAL HIGH (ref 0.60–1.00)
Glucose, Bld: 77 mg/dL (ref 65–139)
Potassium: 4.6 mmol/L (ref 3.5–5.3)
Sodium: 141 mmol/L (ref 135–146)
eGFR: 52 mL/min/{1.73_m2} — ABNORMAL LOW (ref >=60)

## 2022-05-05 ENCOUNTER — Inpatient Hospital Stay: Payer: Medicare PPO | Attending: Physician Assistant

## 2022-05-05 ENCOUNTER — Other Ambulatory Visit: Payer: Self-pay

## 2022-05-05 VITALS — BP 174/76 | HR 66 | Temp 98.4°F | Resp 17

## 2022-05-05 DIAGNOSIS — D509 Iron deficiency anemia, unspecified: Secondary | ICD-10-CM | POA: Diagnosis not present

## 2022-05-05 MED ORDER — ACETAMINOPHEN 325 MG PO TABS
650.0000 mg | ORAL_TABLET | Freq: Once | ORAL | Status: AC
  Administered 2022-05-05: 650 mg via ORAL
  Filled 2022-05-05: qty 2

## 2022-05-05 MED ORDER — SODIUM CHLORIDE 0.9 % IV SOLN
300.0000 mg | Freq: Once | INTRAVENOUS | Status: AC
  Administered 2022-05-05: 300 mg via INTRAVENOUS
  Filled 2022-05-05: qty 300

## 2022-05-05 MED ORDER — SODIUM CHLORIDE 0.9 % IV SOLN: Freq: Once | INTRAVENOUS | Status: AC

## 2022-05-05 MED ORDER — LORATADINE 10 MG PO TABS
10.0000 mg | ORAL_TABLET | Freq: Once | ORAL | Status: AC
  Administered 2022-05-05: 10 mg via ORAL
  Filled 2022-05-05: qty 1

## 2022-05-05 NOTE — Patient Instructions (Signed)

## 2022-05-16 DIAGNOSIS — L98499 Non-pressure chronic ulcer of skin of other sites with unspecified severity: Secondary | ICD-10-CM | POA: Diagnosis not present

## 2022-05-16 DIAGNOSIS — L570 Actinic keratosis: Secondary | ICD-10-CM | POA: Diagnosis not present

## 2022-05-16 DIAGNOSIS — L821 Other seborrheic keratosis: Secondary | ICD-10-CM | POA: Diagnosis not present

## 2022-05-18 ENCOUNTER — Ambulatory Visit: Payer: Medicare PPO | Admitting: Internal Medicine

## 2022-05-22 ENCOUNTER — Ambulatory Visit
Admission: RE | Admit: 2022-05-22 | Discharge: 2022-05-22 | Disposition: A | Discharge status: Home / Self Care | Payer: Medicare PPO | Source: Ambulatory Visit | Attending: Nurse Practitioner | Admitting: Nurse Practitioner

## 2022-05-22 ENCOUNTER — Ambulatory Visit: Payer: Medicare PPO

## 2022-05-22 DIAGNOSIS — M810 Age-related osteoporosis without current pathological fracture: Secondary | ICD-10-CM | POA: Diagnosis not present

## 2022-05-22 DIAGNOSIS — E2839 Other primary ovarian failure: Secondary | ICD-10-CM

## 2022-05-22 DIAGNOSIS — M85832 Other specified disorders of bone density and structure, left forearm: Secondary | ICD-10-CM | POA: Diagnosis not present

## 2022-05-22 DIAGNOSIS — Z780 Asymptomatic menopausal state: Secondary | ICD-10-CM | POA: Diagnosis not present

## 2022-05-23 DIAGNOSIS — H5213 Myopia, bilateral: Secondary | ICD-10-CM | POA: Diagnosis not present

## 2022-05-23 DIAGNOSIS — H04123 Dry eye syndrome of bilateral lacrimal glands: Secondary | ICD-10-CM | POA: Diagnosis not present

## 2022-05-23 DIAGNOSIS — H524 Presbyopia: Secondary | ICD-10-CM | POA: Diagnosis not present

## 2022-05-23 DIAGNOSIS — H353130 Nonexudative age-related macular degeneration, bilateral, stage unspecified: Secondary | ICD-10-CM | POA: Diagnosis not present

## 2022-05-23 DIAGNOSIS — H2513 Age-related nuclear cataract, bilateral: Secondary | ICD-10-CM | POA: Diagnosis not present

## 2022-05-29 ENCOUNTER — Encounter: Payer: Self-pay | Admitting: Internal Medicine

## 2022-05-29 ENCOUNTER — Ambulatory Visit (INDEPENDENT_AMBULATORY_CARE_PROVIDER_SITE_OTHER): Payer: Medicare PPO | Admitting: Internal Medicine

## 2022-05-29 VITALS — BP 120/80 | HR 75 | Ht <= 58 in | Wt 98.0 lb

## 2022-05-29 DIAGNOSIS — D509 Iron deficiency anemia, unspecified: Secondary | ICD-10-CM

## 2022-05-29 DIAGNOSIS — K52839 Microscopic colitis, unspecified: Secondary | ICD-10-CM | POA: Diagnosis not present

## 2022-05-29 DIAGNOSIS — K222 Esophageal obstruction: Secondary | ICD-10-CM

## 2022-05-29 NOTE — Progress Notes (Signed)
HISTORY OF PRESENT ILLNESS:  Joan [name] is a 76 y.o. female with multiple significant medical problems as outlined below.  She is followed in this office for:  1.  Microscopic colitis diagnosed December 2020 2.  GERD complicated by peptic stricture 3.  Iron deficiency anemia felt secondary to NSAID induced gastric ulcers  She presents today for follow-up.  She was last seen March 14, 2022.  See that dictation for details.  She felt that oral iron resulted in diarrhea.  She stopped this.  I sent her to see hematology.  She has received 3 iron infusions.  They are monitoring her blood counts.  She also saw Dr. Ronnald [name] regarding skin related lesions.  At the time of her last visit she reported dysphagia to pills and foods.  She underwent upper endoscopy March 16, 2022.  She was noted to have a tight cricopharyngeus.  There was an obvious distal esophageal stricture which was balloon dilated to 20 mm.  She presents today for follow-up.  She is accompanied by her husband.  She did taper budesonide down from 9 mg daily to 6 mg daily which she has been on for 3 weeks.  She reports no problems with diarrhea.  Not needing antidiarrheals.  Next, she reports improvement in dysphagia to foods, but not pills.  She points to the cervical esophagus.  She mostly has trouble with large over-the-counter supplements such as glucosamine and calcium.  REVIEW OF SYSTEMS:  All non-GI ROS negative unless otherwise stated in the HPI except for arthritis, back pain, fatigue, urinary leakage  Past Medical History:  Diagnosis Date   Alopecia areata 02/16/2006   Anxiety state, unspecified 2004   Benign paroxysmal positional vertigo 04/01/03   Cancer ([place])    skin    Cervicalgia 04/01/03   Chronic rhinitis 09/01/05   Corns and callosities    Dermatophytosis of nail    Diarrhea 04/15/2015   Diverticulosis    Dysphagia, unspecified(787.20) 2004   Enthesopathy of ankle and tarsus, unspecified 03/31/96   External  hemorrhoids without mention of complication    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 Lumbo-sacral anterolisthesis     Senile osteoporosis 04/01/03   Sprain of metatarsophalangeal (joint) of foot    Thyrotoxicosis    Thyrotoxicosis without mention of goiter or other cause, without mention of thyrotoxic crisis or storm    Unspecified vitamin D deficiency    Urinary frequency     Past Surgical History:  Procedure Laterality Date   COLONOSCOPY     COLONOSCOPY N/A 04/15/2015   Procedure: COLONOSCOPY;  Surgeon: Joan Mayer, MD;  Location: [place];  Service: Endoscopy;  Laterality: N/A;   DG FINGERS, MULTIPLE LT HAND ([facility] HX)     FOOT SURGERY Bilateral 05/22/2014   Dr.Strom    LUMBAR DISC SURGERY  12/12/2017   Dr Arnoldo Morale   REMOVE GANGLION  1998   DR [facility]    RIGHT WRIST SUGERY FOR TENOSYNOVITIS  1997   DR [facility]    TONSILLECTOMY AND ADENOIDECTOMY  1974   TUBAL LIGATION  1973    Social History Joan [name]  reports  that she has never smoked. She has never used smokeless tobacco. She reports that she does not drink alcohol and does not use drugs.  family history includes Heart attack in her father; Heart disease in her father.  Allergies  Allergen Reactions   Arava [Leflunomide] Diarrhea, Nausea Only and Other (See Comments)    Loss appetite   Penicillins Rash and Other (See Comments)    PATIENT HAS HAD A PCN REACTION WITH IMMEDIATE RASH, FACIAL/TONGUE/THROAT SWELLING, SOB, OR LIGHTHEADEDNESS WITH HYPOTENSION:  #  #  YES  #  #  Has patient had a PCN reaction causing severe rash involving mucus membranes or skin  necrosis: no Has patient had a PCN reaction that required hospitalization: no    Methotrexate Other (See Comments)   Penicillamine Other (See Comments)   Methotrexate Derivatives Other (See Comments)    Hair loss   Neurontin [Gabapentin] Nausea And Vomiting       PHYSICAL EXAMINATION: Vital signs: BP 120/80   Pulse 75   Ht '4\' 6"'$  (1.372 m)   Wt 98 lb (44.5 kg)   BMI 23.63 kg/m   Constitutional: generally well-appearing, no acute distress Psychiatric: alert and oriented x3, cooperative Eyes: extraocular movements intact, anicteric, conjunctiva pink Mouth: oral pharynx moist, no lesions Neck: supple no lymphadenopathy Cardiovascular: heart regular rate and rhythm, no murmur Lungs: clear to auscultation bilaterally Abdomen: soft, nontender, nondistended, no obvious ascites, no peritoneal signs, normal bowel sounds, no organomegaly Rectal: Omitted Extremities: no clubbing, cyanosis.  1+ edema at the ankles.  Deforming arthritis Skin: Multiple ecchymoses and lower extremity edema Neuro: No focal deficits.  Cranial nerves intact  ASSESSMENT:  1.  Collagenous colitis.  Doing well on budesonide 6 mg daily. 2.  GERD complicated by peptic stricture status post dilation.  No reflux symptoms on omeprazole.  Improvement in solid food dysphagia likely due to successful dilation of the lower stricture.  Ongoing complaints of cervical pill dysphagia likely secondary to tight cricopharyngeus and large pill size 3.  Iron deficiency anemia.  Suspected to be secondary to NSAID related ulcers.  In addition an element of anemia of chronic disease 4.  Colonoscopy December 2020 with diverticulosis but no neoplasia.   PLAN:  1.  Continue budesonide 6 mg daily for 3 weeks then decrease to 3 mg daily for 6 weeks, then stop. 2.  Continue omeprazole 40 mg daily 3.  Continue to follow up closely with hematology to provide appropriate iron replacement therapy and monitor blood counts and iron levels 4.   GI office follow-up 3 months.  Contact the office in the interim for any questions or problems.

## 2022-05-29 NOTE — Patient Instructions (Signed)
_______________________________________________________  If you are age 75 or older, your body mass index should be between 23-30. Your Body mass index is 23.63 kg/m. If this is out of the aforementioned range listed, please consider follow up with your Primary Care Provider.  If you are age 36 or younger, your body mass index should be between 19-25. Your Body mass index is 23.63 kg/m. If this is out of the aformentioned range listed, please consider follow up with your Primary Care Provider.   ________________________________________________________  The [place] [facility] providers would like to encourage you to use [facility] to communicate with providers for non-urgent requests or questions.  Due to long hold times on the telephone, sending your provider a message by [facility] may be a faster and more efficient way to get a response.  Please allow 48 business hours for a response.  Please remember that this is for non-urgent requests.  _______________________________________________________  Take 2 Budesonide a day for 3 more weeks; take 1 a day for 6 weeks, then stop.  Please follow up in 3 months

## 2022-06-07 DIAGNOSIS — H04123 Dry eye syndrome of bilateral lacrimal glands: Secondary | ICD-10-CM | POA: Diagnosis not present

## 2022-06-07 DIAGNOSIS — H16221 Keratoconjunctivitis sicca, not specified as Sjogren's, right eye: Secondary | ICD-10-CM | POA: Diagnosis not present

## 2022-06-07 DIAGNOSIS — H353130 Nonexudative age-related macular degeneration, bilateral, stage unspecified: Secondary | ICD-10-CM | POA: Diagnosis not present

## 2022-06-07 DIAGNOSIS — H2513 Age-related nuclear cataract, bilateral: Secondary | ICD-10-CM | POA: Diagnosis not present

## 2022-06-10 ENCOUNTER — Telehealth: Payer: Self-pay | Admitting: Family Medicine

## 2022-06-12 NOTE — Telephone Encounter (Signed)
Pharmacy requested refill.   Pended Rx and sent to [facility] for approval due to [place]. (Dr. Sabra Heck out of office)

## 2022-06-13 ENCOUNTER — Ambulatory Visit
Admission: RE | Admit: 2022-06-13 | Discharge: 2022-06-13 | Disposition: A | Discharge status: Home / Self Care | Payer: Medicare PPO | Source: Ambulatory Visit | Attending: Family Medicine | Admitting: Family Medicine

## 2022-06-13 DIAGNOSIS — Z1231 Encounter for screening mammogram for malignant neoplasm of breast: Secondary | ICD-10-CM

## 2022-06-19 ENCOUNTER — Other Ambulatory Visit: Payer: Self-pay | Admitting: Family Medicine

## 2022-06-19 DIAGNOSIS — I10 Essential (primary) hypertension: Secondary | ICD-10-CM

## 2022-06-28 ENCOUNTER — Ambulatory Visit: Payer: Medicare PPO | Admitting: Family Medicine

## 2022-06-28 ENCOUNTER — Encounter: Payer: Self-pay | Admitting: Family Medicine

## 2022-06-28 VITALS — BP 142/70 | HR 74 | Temp 96.5°F | Ht <= 58 in | Wt 99.6 lb

## 2022-06-28 DIAGNOSIS — Z0000 Encounter for general adult medical examination without abnormal findings: Secondary | ICD-10-CM | POA: Diagnosis not present

## 2022-06-28 DIAGNOSIS — Z23 Encounter for immunization: Secondary | ICD-10-CM

## 2022-06-28 DIAGNOSIS — R197 Diarrhea, unspecified: Secondary | ICD-10-CM | POA: Diagnosis not present

## 2022-06-28 DIAGNOSIS — F32A Depression, unspecified: Secondary | ICD-10-CM | POA: Diagnosis not present

## 2022-06-28 DIAGNOSIS — H6123 Impacted cerumen, bilateral: Secondary | ICD-10-CM | POA: Diagnosis not present

## 2022-06-28 DIAGNOSIS — R5383 Other fatigue: Secondary | ICD-10-CM

## 2022-06-28 DIAGNOSIS — E890 Postprocedural hypothyroidism: Secondary | ICD-10-CM | POA: Diagnosis not present

## 2022-06-28 MED ORDER — CITALOPRAM HYDROBROMIDE 10 MG PO TABS: 10.0000 mg | ORAL_TABLET | Freq: Every day | ORAL | 3 refills | Status: DC

## 2022-06-28 NOTE — Progress Notes (Signed)
Provider:  Alain Honey, MD  Careteam: Patient Care Team: Wardell Honour, MD as PCP - General (Family Medicine) Jerline Pain, MD as PCP - Cardiology (Cardiology) Hennie Duos, MD as Consulting Physician (Rheumatology) Altheimer, Legrand Como, MD as Consulting Physician (Endocrinology) Newman Pies, MD as Consulting Physician (Neurosurgery) Clydell Hakim, MD (Inactive) as Consulting Physician (Anesthesiology) Jola Baptist, Madie Reno, MD (Inactive) as Consulting Physician (Diagnostic Radiology) Jolene Schimke, MD as Referring Physician (Dermatology) Lynnell Dike, OD as Consulting Physician (Optometry)  PLACE OF SERVICE:  [facility] Directive information    Allergies  Allergen Reactions   Arava [Leflunomide] Diarrhea, Nausea Only and Other (See Comments)    Loss appetite   Penicillins Rash and Other (See Comments)    PATIENT HAS HAD A PCN REACTION WITH IMMEDIATE RASH, FACIAL/TONGUE/THROAT SWELLING, SOB, OR LIGHTHEADEDNESS WITH HYPOTENSION:  #  #  YES  #  #  Has patient had a PCN reaction causing severe rash involving mucus membranes or skin necrosis: no Has patient had a PCN reaction that required hospitalization: no    Methotrexate Other (See Comments)   Penicillamine Other (See Comments)   Methotrexate Derivatives Other (See Comments)    Hair loss   Neurontin [Gabapentin] Nausea And Vomiting    Chief Complaint  Patient presents with   Acute Visit    Patient presents today to discuss bone density scan result.     HPI: Patient is a 76 y.o. female here for medical management of chronic problems.  She still complains of low energy..  Has recently had iron infusions and will have a repeat CBC done in the next few weeks.  B12 level was checked and its 4 times normal.  In thinking about energy often found that anxiety and stress and might be depression play a role.  I have suggested that we might try low-dose antidepressant for a few months to see if that  helps her energy levels. We also discussed results of her most recent bone mineral density.  T-scores are still low most recent -3.3.  She is receiving Prolia injections from her rheumatologist.  Reviewed that she is taking adequate doses of calcium and vitamin D.  Review of Systems:  Review of Systems  Constitutional:  Positive for malaise/fatigue.  HENT:  Positive for hearing loss.   Respiratory: Negative.    Cardiovascular: Negative.   Gastrointestinal:  Positive for diarrhea.  Musculoskeletal:  Positive for joint pain.  Neurological: Negative.   Psychiatric/Behavioral: Negative.      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 Lumbo-sacral anterolisthesis     Senile osteoporosis 04/01/03  Sprain of metatarsophalangeal (joint) of foot    Thyrotoxicosis    Thyrotoxicosis without mention of goiter or other cause, without mention of thyrotoxic crisis or storm    Unspecified vitamin D deficiency    Urinary frequency    Past Surgical History:  Procedure Laterality Date   COLONOSCOPY     COLONOSCOPY N/A 04/15/2015   Procedure: COLONOSCOPY;  Surgeon: Gatha Mayer, MD;   Location: [facility];  Service: Endoscopy;  Laterality: N/A;   DG FINGERS, MULTIPLE LT HAND ([facility] HX)     FOOT SURGERY Bilateral 05/22/2014   Dr.Strom    LUMBAR DISC SURGERY  12/12/2017   Dr Arnoldo Morale   REMOVE GANGLION  1998   DR [facility]    RIGHT WRIST SUGERY FOR TENOSYNOVITIS  1997   DR [facility]   TUBAL LIGATION  1973   Social History:   reports that she has never smoked. She has never used smokeless tobacco. She reports that she does not drink alcohol and does not use drugs.  Family History  Problem Relation Age of Onset   Heart attack Father        age 89   Heart disease Father    Colon cancer Neg Hx    Esophageal cancer Neg Hx    Rectal cancer Neg Hx    Stomach cancer Neg Hx     Medications: Patient's Medications  New Prescriptions   No medications on file  Previous Medications   ACETAMINOPHEN (TYLENOL) 650 MG CR TABLET    Take 1,300 mg by mouth at bedtime.   AMITRIPTYLINE (ELAVIL) 10 MG TABLET    TAKE 1 TABLET BY MOUTH ONCE DAILY AT BEDTIME TO  HELP  RELAX  MUSCLES   BIOTIN 1000 MCG TABLET    Take 1 tablet (1 mg total) by mouth 2 (two) times daily.   BUDESONIDE (ENTOCORT EC) 3 MG 24 HR CAPSULE    Take 3 capsules (9 mg total) by mouth daily.   CALCIUM CARB-CHOLECALCIFEROL (CALTRATE 600+D3) 600-800 MG-UNIT TABS    Take 1 tablet by mouth 2 (two) times daily.   CHOLECALCIFEROL (VITAMIN D3) 50 MCG (2000 UT) CAPSULE    Take 1 capsule (2,000 Units total) by mouth daily.   CYCLOSPORINE (RESTASIS) 0.05 % OPHTHALMIC EMULSION    Place 1 drop into both eyes 2 (two) times daily.   DENOSUMAB (PROLIA) 60 MG/ML SOSY INJECTION    Inject 60 mg into the skin every 6 (six) months.   DIPHENOXYLATE-ATROPINE (LOMOTIL) 2.5-0.025 MG TABLET    TAKE 1 TO 2 TABLETS BY MOUTH EVERY 8 HOURS AS NEEDED   ETANERCEPT (ENBREL) 50 MG/ML INJECTION    Inject 50 mg into the skin once a week.   FLUTICASONE (FLONASE) 50 MCG/ACT NASAL SPRAY    Place 1 spray into both  nostrils daily for 14 days.   FOLIC ACID (FOLVITE) 1 MG TABLET    Take 1 mg by mouth daily.   FUROSEMIDE (LASIX) 20 MG TABLET    Take 1 tablet (20 mg total) by mouth daily.   GLUCOSAMINE-CHONDROIT-VIT C-MN (GLUCOSAMINE CHONDR 500 COMPLEX PO)    Take 2 tablets by mouth daily.    HYDROXYCHLOROQUINE (PLAQUENIL) 200 MG TABLET       LOSARTAN (COZAAR) 100 MG TABLET    Take 1 tablet by mouth once daily   MENTHOL, TOPICAL ANALGESIC, (ASPERCREME MAX ROLL-ON EX)    Apply topically.   MINOCYCLINE (MINOCIN) 100 MG CAPSULE    Take  100 mg by mouth 2 (two) times daily.   MULTIPLE VITAMIN (MULTIVITAMIN) TABLET    Take 1 tablet by mouth daily.    NAPROXEN (NAPROSYN) 500 MG TABLET    Take 1 tablet (500 mg total) by mouth in the morning and at bedtime.   OMEGA-3 FATTY ACIDS (FISH OIL) 1000 MG CAPS    Take 1,000 mg by mouth daily.    OMEPRAZOLE (PRILOSEC) 40 MG CAPSULE    Take 1 capsule by mouth once daily   PREDNISONE (DELTASONE) 5 MG TABLET    TAKE 1 TABLET BY MOUTH WITH BREAKFAST   PROPYLENE GLYCOL 0.6 % SOLN    Place 1 drop into both eyes 2 (two) times daily as needed (dry eyes).    TIROSINT 50 MCG CAPS    Take 1 tablet by mouth 3 (three) times a week.   TRAMADOL (ULTRAM) 50 MG TABLET    Take 1 tablet (50 mg total) by mouth 2 (two) times daily as needed.   TRETINOIN (RETIN-A) 0.025 % CREAM    tretinoin 0.025 % topical cream  APPLY TO FACE AT BEDTIME   UREA (CARMOL) 10 % CREAM    Apply topically as needed.   ZINC GLUCONATE 50 MG TABLET    Take by mouth as needed.  Modified Medications   No medications on file  Discontinued Medications   No medications on file    Physical Exam:  Vitals:   06/28/22 0831  BP: (!) 142/70  Pulse: 74  Temp: (!) 96.5 F (35.8 C)  SpO2: 96%  Weight: 99 lb 9.6 oz (45.2 kg)  Height: '4\' 6"'$  (1.372 m)   Body mass index is 24.01 kg/m. Wt Readings from Last 3 Encounters:  06/28/22 99 lb 9.6 oz (45.2 kg)  05/29/22 98 lb (44.5 kg)  05/02/22 97 lb 6.4 oz (44.2 kg)     Physical Exam Vitals and nursing note reviewed.  Constitutional:      Appearance: Normal appearance.  HENT:     Head:     Comments: Both EACs full of cerumen and were successfully irrigated as she was complaining of her ears feeling stopped up    Mouth/Throat:     Mouth: Mucous membranes are moist.     Pharynx: Oropharynx is clear.  Cardiovascular:     Rate and Rhythm: Normal rate and regular rhythm.  Pulmonary:     Effort: Pulmonary effort is normal.     Breath sounds: Normal breath sounds.  Neurological:     General: No focal deficit present.     Mental Status: She is alert and oriented to person, place, and time.     Labs reviewed: Basic Metabolic Panel: Recent Labs    04/11/22 1309 05/02/22 1021  NA 138 141  K 5.1 4.6  CL 105 105  CO2 28 27  GLUCOSE 97 77  BUN 39* 30*  CREATININE 1.10* 1.12*  CALCIUM 8.5* 9.2   Liver Function Tests: Recent Labs    04/11/22 1309  AST 14*  ALT 11  ALKPHOS 47  BILITOT 0.3  PROT 6.6  ALBUMIN 3.7   No results for input(s): "LIPASE", "AMYLASE" in the last 8760 hours. No results for input(s): "AMMONIA" in the last 8760 hours. CBC: Recent Labs    04/11/22 1309 04/21/22 0745  WBC 13.5* 11.8*  NEUTROABS 11.4* 6.7  HGB 10.7* 11.1*  HCT 33.2* 34.1*  MCV 85.6 85.5  PLT 338 303   Lipid Panel: No results for input(s): "CHOL", "HDL", "LDLCALC", "TRIG", "CHOLHDL", "  LDLDIRECT" in the last 8760 hours. TSH: No results for input(s): "TSH" in the last 8760 hours. A1C: Lab Results  Component Value Date   HGBA1C 5.2 11/17/2019     Assessment/Plan  1. Encounter for Medicare annual wellness exam   2. Fatigue due to depression We will add citalopram 10 mg and recheck in 2 months to see if her energy levels have increased  3. Bilateral impacted cerumen Successfully irrigated today  4. Need for influenza vaccination Injection given  5. Diarrhea, unspecified type Has seen gastroenterologist since her last visit here.   Continues to keep her on budesonide   Alain Honey, MD [place] [phone]

## 2022-07-05 ENCOUNTER — Other Ambulatory Visit: Payer: Self-pay | Admitting: Internal Medicine

## 2022-07-06 ENCOUNTER — Telehealth: Payer: Self-pay | Admitting: Internal Medicine

## 2022-07-06 NOTE — Telephone Encounter (Signed)
Increase budesonide to 9 mg daily. She may use Lomotil 3 or 4 times daily if necessary

## 2022-07-06 NOTE — Telephone Encounter (Signed)
Spoke with pt and she is aware of Dr. Blanch Media recommendations. She knows to call back if she needs more medication sent to the pharmacy.

## 2022-07-06 NOTE — Telephone Encounter (Signed)
Pt states she is taking budesonide '6mg'$  daily and lomotil and she is having diarrhea again. Pt wanted to know if there was a different medication she might need. Discussed with her Dr. Henrene Pastor may have her increase the budesonide.Please advise.

## 2022-07-06 NOTE — Telephone Encounter (Signed)
Patient called states she is having diarrhea, states the medications she has are not helping. Wondering if something can be call in for her to help. Requesting a call back. Please call to advise.

## 2022-07-06 NOTE — Telephone Encounter (Deleted)
Patient called states she is still having diarrhea, states the medications she has are not helping. Wondering if something can be call in for her to help. Requesting a call back.

## 2022-07-10 DIAGNOSIS — M810 Age-related osteoporosis without current pathological fracture: Secondary | ICD-10-CM | POA: Diagnosis not present

## 2022-07-10 DIAGNOSIS — Z681 Body mass index (BMI) 19 or less, adult: Secondary | ICD-10-CM | POA: Diagnosis not present

## 2022-07-10 DIAGNOSIS — D508 Other iron deficiency anemias: Secondary | ICD-10-CM | POA: Diagnosis not present

## 2022-07-10 DIAGNOSIS — M0579 Rheumatoid arthritis with rheumatoid factor of multiple sites without organ or systems involvement: Secondary | ICD-10-CM | POA: Diagnosis not present

## 2022-07-10 DIAGNOSIS — M7022 Olecranon bursitis, left elbow: Secondary | ICD-10-CM | POA: Diagnosis not present

## 2022-07-10 DIAGNOSIS — M5136 Other intervertebral disc degeneration, lumbar region: Secondary | ICD-10-CM | POA: Diagnosis not present

## 2022-07-10 DIAGNOSIS — M1991 Primary osteoarthritis, unspecified site: Secondary | ICD-10-CM | POA: Diagnosis not present

## 2022-07-10 DIAGNOSIS — N1831 Chronic kidney disease, stage 3a: Secondary | ICD-10-CM | POA: Diagnosis not present

## 2022-07-10 DIAGNOSIS — M21241 Flexion deformity, right finger joints: Secondary | ICD-10-CM | POA: Diagnosis not present

## 2022-07-11 DIAGNOSIS — D485 Neoplasm of uncertain behavior of skin: Secondary | ICD-10-CM | POA: Diagnosis not present

## 2022-07-11 DIAGNOSIS — Z79890 Hormone replacement therapy (postmenopausal): Secondary | ICD-10-CM | POA: Diagnosis not present

## 2022-07-11 DIAGNOSIS — L98499 Non-pressure chronic ulcer of skin of other sites with unspecified severity: Secondary | ICD-10-CM | POA: Diagnosis not present

## 2022-07-11 DIAGNOSIS — E890 Postprocedural hypothyroidism: Secondary | ICD-10-CM | POA: Diagnosis not present

## 2022-07-11 DIAGNOSIS — C4441 Basal cell carcinoma of skin of scalp and neck: Secondary | ICD-10-CM | POA: Diagnosis not present

## 2022-07-11 DIAGNOSIS — L821 Other seborrheic keratosis: Secondary | ICD-10-CM | POA: Diagnosis not present

## 2022-07-13 ENCOUNTER — Other Ambulatory Visit: Payer: Self-pay

## 2022-07-13 ENCOUNTER — Inpatient Hospital Stay: Payer: Medicare PPO | Attending: Physician Assistant

## 2022-07-13 ENCOUNTER — Inpatient Hospital Stay: Payer: Medicare PPO | Admitting: Internal Medicine

## 2022-07-13 VITALS — BP 156/85 | HR 74 | Temp 98.3°F | Resp 18 | Wt 99.2 lb

## 2022-07-13 DIAGNOSIS — Z79899 Other long term (current) drug therapy: Secondary | ICD-10-CM | POA: Diagnosis not present

## 2022-07-13 DIAGNOSIS — D72829 Elevated white blood cell count, unspecified: Secondary | ICD-10-CM | POA: Insufficient documentation

## 2022-07-13 DIAGNOSIS — D509 Iron deficiency anemia, unspecified: Secondary | ICD-10-CM | POA: Insufficient documentation

## 2022-07-13 LAB — CBC WITH DIFFERENTIAL/PLATELET
Abs Immature Granulocytes: 0.18 10*3/uL — ABNORMAL HIGH (ref 0.00–0.07)
Basophils Absolute: 0.1 10*3/uL (ref 0.0–0.1)
Basophils Relative: 0 %
Eosinophils Absolute: 0.1 10*3/uL (ref 0.0–0.5)
Eosinophils Relative: 0 %
HCT: 35.3 % — ABNORMAL LOW (ref 36.0–46.0)
Hemoglobin: 11.7 g/dL — ABNORMAL LOW (ref 12.0–15.0)
Immature Granulocytes: 1 %
Lymphocytes Relative: 12 %
Lymphs Abs: 1.6 10*3/uL (ref 0.7–4.0)
MCH: 31.3 pg (ref 26.0–34.0)
MCHC: 33.1 g/dL (ref 30.0–36.0)
MCV: 94.4 fL (ref 80.0–100.0)
Monocytes Absolute: 1 10*3/uL (ref 0.1–1.0)
Monocytes Relative: 7 %
Neutro Abs: 10.9 10*3/uL — ABNORMAL HIGH (ref 1.7–7.7)
Neutrophils Relative %: 80 %
Platelets: 309 10*3/uL (ref 150–400)
RBC: 3.74 MIL/uL — ABNORMAL LOW (ref 3.87–5.11)
RDW: 16.4 % — ABNORMAL HIGH (ref 11.5–15.5)
WBC: 13.8 10*3/uL — ABNORMAL HIGH (ref 4.0–10.5)
nRBC: 0 % (ref 0.0–0.2)

## 2022-07-13 LAB — FERRITIN: Ferritin: 110 ng/mL (ref 11–307)

## 2022-07-13 LAB — IRON AND IRON BINDING CAPACITY (CC-WL,HP ONLY)
Iron: 72 ug/dL (ref 28–170)
Saturation Ratios: 25 % (ref 10.4–31.8)
TIBC: 291 ug/dL (ref 250–450)
UIBC: 219 ug/dL (ref 148–442)

## 2022-07-13 NOTE — Progress Notes (Signed)
[facility] Telephone:([phone]) [phone]   Fax:(336) [phone]  OFFICE PROGRESS NOTE  Wardell Honour, MD [place] [address]  DIAGNOSIS: Iron deficiency anemia secondary to gastrointestinal blood loss.  PRIOR THERAPY: Iron infusion with Venofer 300 Mg IV weekly for 3 weeks.  CURRENT THERAPY: None  INTERVAL HISTORY: Joan [name] 76 y.o. female returns to the clinic today for follow-up visit.  The patient is feeling fine today with no concerning complaints except for mild fatigue.  She has intolerance to the oral iron tablets.  She was treated with iron infusion with Venofer 300 mg weekly for 3 weeks and tolerated it well.  She felt little bit better after the infusion but now she has more fatigue.  She has no chest pain, shortness of breath, cough or hemoptysis.  She has no nausea, vomiting, diarrhea or constipation.  She has no headache or visual changes.  She is here today for evaluation and repeat blood work.  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 Lumbo-sacral anterolisthesis     Senile osteoporosis 04/01/03    Sprain of metatarsophalangeal (joint) of foot    Thyrotoxicosis    Thyrotoxicosis without mention of goiter or other cause, without mention of thyrotoxic crisis or storm    Unspecified vitamin D deficiency    Urinary frequency     ALLERGIES:  is allergic to arava [leflunomide], penicillins, methotrexate, penicillamine, methotrexate derivatives, and neurontin [gabapentin].  MEDICATIONS:  Current Outpatient Medications  Medication Sig Dispense Refill   acetaminophen (TYLENOL) 650 MG CR tablet Take 1,300 mg by mouth at bedtime.     Biotin 1000 MCG tablet Take 1 tablet (1 mg total) by mouth 2 (two) times daily. 60 tablet 3   budesonide (ENTOCORT EC) 3 MG 24 hr capsule Take 3 capsules (9 mg total) by mouth daily. 270 capsule 3   Calcium Carb-Cholecalciferol (CALTRATE 600+D3) 600-800 MG-UNIT TABS Take 1 tablet by mouth 2 (two) times daily. 60 tablet 11   Cholecalciferol (VITAMIN D3) 50 MCG (2000 UT) capsule Take 1 capsule (2,000 Units total) by mouth daily. 30 capsule 3   citalopram (CELEXA) 10 MG tablet Take 1 tablet (10 mg total) by mouth daily. 30 tablet 3   cycloSPORINE (RESTASIS) 0.05 % ophthalmic emulsion Place 1 drop into both eyes 2 (two) times daily.     denosumab (PROLIA) 60 MG/ML SOSY injection Inject 60 mg into the skin every 6 (six) months. 1 mL 1   diphenoxylate-atropine (LOMOTIL) 2.5-0.025 MG tablet TAKE 1 TO 2 TABLETS BY MOUTH EVERY 8 HOURS AS NEEDED 60 tablet 3  etanercept (ENBREL) 50 MG/ML injection Inject 50 mg into the skin once a week.     fluticasone (FLONASE) 50 MCG/ACT nasal spray Place 1 spray into both nostrils daily for 14 days. 1 g 0   folic acid (FOLVITE) 1 MG tablet Take 1 mg by mouth daily.     furosemide (LASIX) 20 MG tablet Take 1 tablet (20 mg total) by mouth daily. 90 tablet 3   Glucosamine-Chondroit-Vit C-Mn (GLUCOSAMINE CHONDR 500 COMPLEX PO) Take 2 tablets by mouth daily.      hydroxychloroquine (PLAQUENIL) 200 MG tablet      losartan (COZAAR) 100 MG tablet  Take 1 tablet by mouth once daily 90 tablet 0   Menthol, Topical Analgesic, (ASPERCREME MAX ROLL-ON EX) Apply topically.     minocycline (MINOCIN) 100 MG capsule Take 100 mg by mouth 2 (two) times daily.     Multiple Vitamin (MULTIVITAMIN) tablet Take 1 tablet by mouth daily.      naproxen (NAPROSYN) 500 MG tablet Take 1 tablet (500 mg total) by mouth in the morning and at bedtime. (Patient taking differently: Take 500 mg by mouth daily.) 180 tablet 2   Omega-3 Fatty Acids (FISH OIL) 1000 MG CAPS Take 1,000 mg by mouth daily.      omeprazole (PRILOSEC) 40 MG capsule Take 1 capsule by mouth once daily 90 capsule 6   predniSONE (DELTASONE) 5 MG tablet TAKE 1 TABLET BY MOUTH WITH BREAKFAST 90 tablet 0   Propylene Glycol 0.6 % SOLN Place 1 drop into both eyes 2 (two) times daily as needed (dry eyes).      TIROSINT 50 MCG CAPS Take 1 tablet by mouth 3 (three) times a week.     traMADol (ULTRAM) 50 MG tablet Take 1 tablet (50 mg total) by mouth 2 (two) times daily as needed. 60 tablet 5   tretinoin (RETIN-A) 0.025 % cream tretinoin 0.025 % topical cream  APPLY TO FACE AT BEDTIME     urea (CARMOL) 10 % cream Apply topically as needed.     zinc gluconate 50 MG tablet Take by mouth as needed.     No current facility-administered medications for this visit.    SURGICAL HISTORY:  Past Surgical History:  Procedure Laterality Date   COLONOSCOPY     COLONOSCOPY N/A 04/15/2015   Procedure: COLONOSCOPY;  Surgeon: Gatha Mayer, MD;  Location: [facility];  Service: Endoscopy;  Laterality: N/A;   DG FINGERS, MULTIPLE LT HAND ([facility] HX)     FOOT SURGERY Bilateral 05/22/2014   Dr.Strom    LUMBAR DISC SURGERY  12/12/2017   Dr Arnoldo Morale   REMOVE GANGLION  1998   DR [facility]    RIGHT WRIST SUGERY FOR TENOSYNOVITIS  1997   DR [place]   TUBAL LIGATION  1973    REVIEW OF SYSTEMS:  A comprehensive review of systems was negative except for: Constitutional: positive for  fatigue   PHYSICAL EXAMINATION: General appearance: alert, cooperative, fatigued, and no distress Head: Normocephalic, without obvious abnormality, atraumatic Neck: no adenopathy, no JVD, supple, symmetrical, trachea midline, and thyroid not enlarged, symmetric, no tenderness/mass/nodules Lymph nodes: Cervical, supraclavicular, and axillary nodes normal. Resp: clear to auscultation bilaterally Back: symmetric, no curvature. ROM normal. No CVA tenderness. Cardio: regular rate and rhythm, S1, S2 normal, no murmur, click, rub or gallop GI: soft, non-tender; bowel sounds normal; no masses,  no organomegaly Extremities: extremities normal, atraumatic, no cyanosis or edema  ECOG PERFORMANCE STATUS: 1 -  Symptomatic but completely ambulatory  Blood pressure (!) 156/85, pulse 74, temperature 98.3 F (36.8 C), temperature source Oral, resp. rate 18, weight 99 lb 3 oz (45 kg), SpO2 100 %.  LABORATORY DATA: Lab Results  Component Value Date   WBC 13.8 (H) 07/13/2022   HGB 11.7 (L) 07/13/2022   HCT 35.3 (L) 07/13/2022   MCV 94.4 07/13/2022   PLT 309 07/13/2022      Chemistry      Component Value Date/Time   NA 141 05/02/2022 1021   NA 143 02/02/2016 0840   K 4.6 05/02/2022 1021   CL 105 05/02/2022 1021   CO2 27 05/02/2022 1021   BUN 30 (H) 05/02/2022 1021   BUN 13 02/02/2016 0840   CREATININE 1.12 (H) 05/02/2022 1021      Component Value Date/Time   CALCIUM 9.2 05/02/2022 1021   ALKPHOS 47 04/11/2022 1309   AST 14 (L) 04/11/2022 1309   ALT 11 04/11/2022 1309   BILITOT 0.3 04/11/2022 1309       RADIOGRAPHIC STUDIES: MM 3D SCREEN BREAST BILATERAL  Result Date: 06/14/2022 CLINICAL DATA:  Screening. EXAM: DIGITAL SCREENING BILATERAL MAMMOGRAM WITH TOMOSYNTHESIS AND CAD TECHNIQUE: Bilateral screening digital craniocaudal and mediolateral oblique mammograms were obtained. Bilateral screening digital breast tomosynthesis was performed. The images were evaluated with computer-aided  detection. COMPARISON:  Previous exam(s). ACR Breast Density Category c: The breast tissue is heterogeneously dense, which may obscure small masses. FINDINGS: There are no findings suspicious for malignancy. IMPRESSION: No mammographic evidence of malignancy. A result letter of this screening mammogram will be mailed directly to the patient. RECOMMENDATION: Screening mammogram in one year. (Code:SM-B-01Y) BI-RADS CATEGORY  1: Negative. Electronically Signed   By: Lovey Newcomer M.D.   On: 06/14/2022 06:25    ASSESSMENT AND PLAN: This is a very pleasant 76 years old white female with history of iron deficiency anemia secondary to gastrointestinal blood loss.  The patient has intolerance to the oral iron tablets. She was treated with iron infusion with Venofer 300 mg IV weekly for 3 weeks and tolerated it well. Repeat CBC today showed improvement of her hemoglobin up to 11.7 and hematocrit 35.3%.  She continues to have mild leukocytosis. Iron study and ferritin are still pending. I recommended for the patient to continue on observation for now.  I will see her back for follow-up visit in 3 months for evaluation with repeat CBC, iron study and ferritin.  I would consider her for repeat iron infusion if the pending iron studies showed significant deficiency. She was advised to call immediately if she has any other concerning issues in the interval. The patient voices understanding of current disease status and treatment options and is in agreement with the current care plan.  All questions were answered. The patient knows to call the clinic with any problems, questions or concerns. We can certainly see the patient much sooner if necessary.  The total time spent in the appointment was 20 minutes.  Disclaimer: This note was dictated with voice recognition software. Similar sounding words can inadvertently be transcribed and may not be corrected upon review.

## 2022-07-18 DIAGNOSIS — L578 Other skin changes due to chronic exposure to nonionizing radiation: Secondary | ICD-10-CM | POA: Diagnosis not present

## 2022-07-18 DIAGNOSIS — L821 Other seborrheic keratosis: Secondary | ICD-10-CM | POA: Diagnosis not present

## 2022-07-18 DIAGNOSIS — L570 Actinic keratosis: Secondary | ICD-10-CM | POA: Diagnosis not present

## 2022-07-18 DIAGNOSIS — T148XXS Other injury of unspecified body region, sequela: Secondary | ICD-10-CM | POA: Diagnosis not present

## 2022-08-08 ENCOUNTER — Ambulatory Visit (HOSPITAL_COMMUNITY): Payer: Medicare PPO | Attending: Cardiology

## 2022-08-08 DIAGNOSIS — I340 Nonrheumatic mitral (valve) insufficiency: Secondary | ICD-10-CM | POA: Insufficient documentation

## 2022-08-08 DIAGNOSIS — I071 Rheumatic tricuspid insufficiency: Secondary | ICD-10-CM | POA: Diagnosis not present

## 2022-08-08 LAB — ECHOCARDIOGRAM COMPLETE
Area-P 1/2: 3.08 cm2
S' Lateral: 1.75 cm

## 2022-08-21 ENCOUNTER — Other Ambulatory Visit: Payer: Self-pay | Admitting: Nurse Practitioner

## 2022-08-22 DIAGNOSIS — M810 Age-related osteoporosis without current pathological fracture: Secondary | ICD-10-CM | POA: Diagnosis not present

## 2022-08-24 ENCOUNTER — Encounter: Payer: Self-pay | Admitting: Physician Assistant

## 2022-08-24 DIAGNOSIS — M810 Age-related osteoporosis without current pathological fracture: Secondary | ICD-10-CM | POA: Diagnosis not present

## 2022-08-30 ENCOUNTER — Ambulatory Visit: Payer: Medicare PPO | Admitting: Family Medicine

## 2022-08-30 ENCOUNTER — Encounter: Payer: Self-pay | Admitting: Family Medicine

## 2022-08-30 VITALS — BP 138/86 | HR 73 | Temp 95.6°F | Ht <= 58 in | Wt 98.8 lb

## 2022-08-30 DIAGNOSIS — I340 Nonrheumatic mitral (valve) insufficiency: Secondary | ICD-10-CM | POA: Diagnosis not present

## 2022-08-30 DIAGNOSIS — E038 Other specified hypothyroidism: Secondary | ICD-10-CM | POA: Diagnosis not present

## 2022-08-30 DIAGNOSIS — M797 Fibromyalgia: Secondary | ICD-10-CM

## 2022-08-30 DIAGNOSIS — M069 Rheumatoid arthritis, unspecified: Secondary | ICD-10-CM | POA: Diagnosis not present

## 2022-08-30 DIAGNOSIS — I10 Essential (primary) hypertension: Secondary | ICD-10-CM

## 2022-08-30 LAB — BASIC METABOLIC PANEL WITH GFR
BUN: 25 mg/dL (ref 7–25)
CO2: 24 mmol/L (ref 20–32)
Calcium: 7.8 mg/dL — ABNORMAL LOW (ref 8.6–10.4)
Chloride: 109 mmol/L (ref 98–110)
Creat: 0.92 mg/dL (ref 0.60–1.00)
Glucose, Bld: 77 mg/dL (ref 65–139)
Potassium: 4.4 mmol/L (ref 3.5–5.3)
Sodium: 140 mmol/L (ref 135–146)
eGFR: 65 mL/min/{1.73_m2} (ref >=60)

## 2022-08-30 NOTE — Progress Notes (Signed)
Provider:  Alain Honey, MD  Careteam: Patient Care Team: Wardell Honour, MD as PCP - General (Family Medicine) Jerline Pain, MD as PCP - Cardiology (Cardiology) Hennie Duos, MD as Consulting Physician (Rheumatology) Altheimer, Legrand Como, MD as Consulting Physician (Endocrinology) Newman Pies, MD as Consulting Physician (Neurosurgery) Clydell Hakim, MD (Inactive) as Consulting Physician (Anesthesiology) Jola Baptist, Madie Reno, MD (Inactive) as Consulting Physician (Diagnostic Radiology) Jolene Schimke, MD as Referring Physician (Dermatology) Lynnell Dike, OD as Consulting Physician (Optometry)  PLACE OF SERVICE:  [place] Directive information    Allergies  Allergen Reactions   Arava [Leflunomide] Diarrhea, Nausea Only and Other (See Comments)    Loss appetite   Penicillins Rash and Other (See Comments)    PATIENT HAS HAD A PCN REACTION WITH IMMEDIATE RASH, FACIAL/TONGUE/THROAT SWELLING, SOB, OR LIGHTHEADEDNESS WITH HYPOTENSION:  #  #  YES  #  #  Has patient had a PCN reaction causing severe rash involving mucus membranes or skin necrosis: no Has patient had a PCN reaction that required hospitalization: no    Methotrexate Other (See Comments)   Penicillamine Other (See Comments)   Methotrexate Derivatives Other (See Comments)    Hair loss   Neurontin [Gabapentin] Nausea And Vomiting    Chief Complaint  Patient presents with   Medical Management of Chronic Issues    Patient presents today for a 4 month follow-up   Quality Metric Gaps    COVID#4     HPI: Patient is a 76 y.o. female.  Patient is here for medical management of chronic problems including hypertension, fibromyalgia, rheumatoid arthritis, anemia,.  She has been doing well lately.  She is also followed by rheumatologist and endocrinology.  We had a discussion today about use of Naprosyn in view of her chronic kidney disease.  I informed her that preference would be to not use  Naprosyn with CKD but if she promises to drink fluids and maintain hydration we could follow her kidney function closely.  She also uses tramadol for pain.  Pain is largely related to her arthritis and fibromyalgia.  Weight and blood pressure are stable.  Review of Systems:  Review of Systems  Constitutional:  Positive for malaise/fatigue.  HENT: Negative.    Respiratory: Negative.    Cardiovascular: Negative.   Musculoskeletal:  Positive for joint pain and myalgias.  Psychiatric/Behavioral:  The patient is nervous/anxious.   All other systems reviewed and are negative.   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 Lumbo-sacral anterolisthesis     Senile osteoporosis 04/01/03   Sprain of metatarsophalangeal (joint) of foot  Thyrotoxicosis    Thyrotoxicosis without mention of goiter or other cause, without mention of thyrotoxic crisis or storm    Unspecified vitamin D deficiency    Urinary frequency    Past Surgical History:  Procedure Laterality Date   COLONOSCOPY     COLONOSCOPY N/A 04/15/2015   Procedure: COLONOSCOPY;  Surgeon: Gatha Mayer, MD;  Location: [facility];  Service: Endoscopy;  Laterality: N/A;   DG FINGERS, MULTIPLE LT HAND ([facility] HX)     FOOT SURGERY Bilateral 05/22/2014   Dr.Strom    LUMBAR DISC SURGERY  12/12/2017   Dr Arnoldo Morale   REMOVE GANGLION  1998   DR [facility]    RIGHT WRIST SUGERY FOR TENOSYNOVITIS  1997   DR [place]   TUBAL LIGATION  1973   Social History:   reports that she has never smoked. She has never used smokeless tobacco. She reports that she does not drink alcohol and does not use drugs.  Family History  Problem Relation Age of Onset   Heart attack Father        age 3   Heart disease Father    Colon cancer Neg Hx    Esophageal cancer Neg Hx    Rectal cancer Neg Hx    Stomach cancer Neg Hx     Medications: Patient's Medications  New Prescriptions   No medications on file  Previous Medications   ACETAMINOPHEN (TYLENOL) 650 MG CR TABLET    Take 1,300 mg by mouth at bedtime.   BIOTIN 1000 MCG TABLET    Take 1 tablet (1 mg total) by mouth 2 (two) times daily.   BUDESONIDE (ENTOCORT EC) 3 MG 24 HR CAPSULE    Take 3 capsules (9 mg total) by mouth daily.   CALCIUM CARB-CHOLECALCIFEROL (CALTRATE 600+D3) 600-800 MG-UNIT TABS    Take 1 tablet by mouth 2 (two) times daily.   CHOLECALCIFEROL (VITAMIN D3) 50 MCG (2000 UT) CAPSULE    Take 1 capsule (2,000 Units total) by mouth daily.   CITALOPRAM (CELEXA) 10 MG TABLET    Take 1 tablet (10 mg total) by mouth daily.   CYCLOSPORINE (RESTASIS) 0.05 % OPHTHALMIC EMULSION    Place 1 drop into both eyes 2 (two) times daily.   DENOSUMAB (PROLIA) 60 MG/ML SOSY INJECTION    Inject 60 mg into the skin every 6 (six) months.   DIPHENOXYLATE-ATROPINE (LOMOTIL) 2.5-0.025 MG TABLET    TAKE 1 TO 2 TABLETS BY MOUTH EVERY 8 HOURS AS NEEDED   ETANERCEPT (ENBREL) 50 MG/ML INJECTION    Inject 50 mg into the skin once a week.   FLUTICASONE (FLONASE) 50 MCG/ACT NASAL SPRAY    Place 1 spray into both nostrils daily for 14 days.   FOLIC ACID  (FOLVITE) 1 MG TABLET    Take 1 mg by mouth daily.   FUROSEMIDE (LASIX) 20 MG TABLET    Take 1 tablet (20 mg total) by mouth daily.   GLUCOSAMINE-CHONDROIT-VIT C-MN (GLUCOSAMINE CHONDR 500 COMPLEX PO)    Take 2 tablets by mouth daily.    HYDROXYCHLOROQUINE (PLAQUENIL) 200 MG TABLET       LOSARTAN (COZAAR) 100 MG TABLET    Take 1 tablet by mouth once daily   MENTHOL, TOPICAL ANALGESIC, (ASPERCREME MAX ROLL-ON EX)    Apply topically.   MINOCYCLINE (MINOCIN) 100 MG CAPSULE    Take 100 mg by mouth 2 (two) times daily.   MULTIPLE VITAMIN (MULTIVITAMIN) TABLET  Take 1 tablet by mouth daily.    NAPROXEN (NAPROSYN) 500 MG TABLET    Take 1 tablet (500 mg total) by mouth in the morning and at bedtime.   OMEGA-3 FATTY ACIDS (FISH OIL) 1000 MG CAPS    Take 1,000 mg by mouth daily.    OMEPRAZOLE (PRILOSEC) 40 MG CAPSULE    Take 1 capsule by mouth once daily   PREDNISONE (DELTASONE) 5 MG TABLET    TAKE 1 TABLET BY MOUTH WITH BREAKFAST   PROPYLENE GLYCOL 0.6 % SOLN    Place 1 drop into both eyes 2 (two) times daily as needed (dry eyes).    TIROSINT 50 MCG CAPS    Take 1 tablet by mouth 3 (three) times a week.   TRAMADOL (ULTRAM) 50 MG TABLET    Take 1 tablet (50 mg total) by mouth 2 (two) times daily as needed.   TRETINOIN (RETIN-A) 0.025 % CREAM    tretinoin 0.025 % topical cream  APPLY TO FACE AT BEDTIME   UREA (CARMOL) 10 % CREAM    Apply topically as needed.   ZINC GLUCONATE 50 MG TABLET    Take by mouth as needed.  Modified Medications   No medications on file  Discontinued Medications   No medications on file    Physical Exam:  Vitals:   08/30/22 0854  BP: 138/86  Pulse: 73  Temp: (!) 95.6 F (35.3 C)  SpO2: 96%  Weight: 98 lb 12.8 oz (44.8 kg)  Height: '4\' 6"'$  (1.372 m)   Body mass index is 23.82 kg/m. Wt Readings from Last 3 Encounters:  08/30/22 98 lb 12.8 oz (44.8 kg)  07/13/22 99 lb 3 oz (45 kg)  06/28/22 99 lb 9.6 oz (45.2 kg)    Physical Exam Vitals and nursing note  reviewed.  Constitutional:      Appearance: Normal appearance.  Cardiovascular:     Rate and Rhythm: Normal rate and regular rhythm.     Heart sounds: Murmur heard.  Pulmonary:     Effort: Pulmonary effort is normal.     Breath sounds: Normal breath sounds.  Musculoskeletal:        General: Swelling and tenderness present.     Comments: Multiple joints involved secondary to arthritis  Neurological:     General: No focal deficit present.     Mental Status: She is alert and oriented to person, place, and time.     Labs reviewed: Basic Metabolic Panel: Recent Labs    04/11/22 1309 05/02/22 1021  NA 138 141  K 5.1 4.6  CL 105 105  CO2 28 27  GLUCOSE 97 77  BUN 39* 30*  CREATININE 1.10* 1.12*  CALCIUM 8.5* 9.2   Liver Function Tests: Recent Labs    04/11/22 1309  AST 14*  ALT 11  ALKPHOS 47  BILITOT 0.3  PROT 6.6  ALBUMIN 3.7   No results for input(s): "LIPASE", "AMYLASE" in the last 8760 hours. No results for input(s): "AMMONIA" in the last 8760 hours. CBC: Recent Labs    04/11/22 1309 04/21/22 0745 07/13/22 1020  WBC 13.5* 11.8* 13.8*  NEUTROABS 11.4* 6.7 10.9*  HGB 10.7* 11.1* 11.7*  HCT 33.2* 34.1* 35.3*  MCV 85.6 85.5 94.4  PLT 338 303 309   Lipid Panel: No results for input(s): "CHOL", "HDL", "LDLCALC", "TRIG", "CHOLHDL", "LDLDIRECT" in the last 8760 hours. TSH: No results for input(s): "TSH" in the last 8760 hours. A1C: Lab Results  Component Value Date   HGBA1C  5.2 11/17/2019     Assessment/Plan  1. Primary hypertension Blood pressure today is 138/86.  She continues to take losartan  2. Fibromyalgia Continues with tramadol naproxen and citalopram.  Symptoms are fairly well-controlled  3. Other specified hypothyroidism Thyroid studies have not been assessed in 1-1/2 years but this is left up to the endocrinologist  4. Nonrheumatic mitral valve regurgitation Followed by cardiology  5. Rheumatoid arthritis, involving unspecified  site, unspecified whether rheumatoid factor present ([place]) Continues with rheumatology takes Kristine Garbe, MD [place] [phone]

## 2022-09-01 ENCOUNTER — Other Ambulatory Visit: Payer: Self-pay | Admitting: Family Medicine

## 2022-09-01 NOTE — Progress Notes (Signed)
Will check PTH since calcium is low

## 2022-09-08 ENCOUNTER — Encounter: Payer: Self-pay | Admitting: Internal Medicine

## 2022-09-08 ENCOUNTER — Ambulatory Visit: Payer: Medicare PPO | Admitting: Internal Medicine

## 2022-09-08 VITALS — BP 112/60 | HR 70 | Ht <= 58 in | Wt 99.0 lb

## 2022-09-08 DIAGNOSIS — K52831 Collagenous colitis: Secondary | ICD-10-CM | POA: Diagnosis not present

## 2022-09-08 DIAGNOSIS — R197 Diarrhea, unspecified: Secondary | ICD-10-CM

## 2022-09-08 DIAGNOSIS — K52839 Microscopic colitis, unspecified: Secondary | ICD-10-CM | POA: Diagnosis not present

## 2022-09-08 DIAGNOSIS — K219 Gastro-esophageal reflux disease without esophagitis: Secondary | ICD-10-CM | POA: Diagnosis not present

## 2022-09-08 DIAGNOSIS — K222 Esophageal obstruction: Secondary | ICD-10-CM

## 2022-09-08 DIAGNOSIS — D509 Iron deficiency anemia, unspecified: Secondary | ICD-10-CM

## 2022-09-08 MED ORDER — BUDESONIDE 3 MG PO CPEP: 9.0000 mg | ORAL_CAPSULE | Freq: Every day | ORAL | 3 refills | Status: DC

## 2022-09-08 NOTE — Progress Notes (Signed)
HISTORY OF PRESENT ILLNESS:  Joan [name] is a 77 y.o. female with multiple significant medical problems as outlined who presents today to the office regarding follow-up for the following GI problems:  1.  Microscopic colitis diagnosed in December 6568. 2.  GERD complicated by peptic stricture. 3.  Iron deficiency anemia secondary to NSAID induced gastric ulcers  Patient was last seen in the office September 2023.  See that dictation for details.  Her chief complaint today is that of diarrhea.  She describes 3 or 4 loose bowel movements mostly in the morning.  She takes 2 Lomotil each morning.  She is currently on budesonide 3 mg daily.  No nocturnal issues or incontinence.  She complains of lower extremity edema.  She complains of arthritic pain.  Tramadol has helped.  She is hoping that her back surgeon will prescribe this for her.  She is accompanied today by her husband.  She has been followed by hematology for iron deficiency anemia.  She has received iron infusion.  Her last ferritin level in November was normal at 110.  Her last hemoglobin in November was improved at 11.7.  He denies active reflux symptoms on omeprazole.  No esophageal dysphagia.  Some dysphagia to large pills only.  Cuts pills, which helps.  Last upper endoscopy with esophageal dilation March 16, 2022. Last colonoscopy with biopsies December 2020  REVIEW OF SYSTEMS:  All non-GI ROS negative as otherwise stated in the HPI except for arthritis, lower extremity swelling  Past Medical History:  Diagnosis Date   Alopecia areata 02/16/2006   Anxiety state, unspecified 2004   Benign paroxysmal positional vertigo 04/01/03   Cancer ([place])    skin    Cervicalgia 04/01/03   Chronic rhinitis 09/01/05   Corns and callosities    Dermatophytosis of nail    Diarrhea 04/15/2015   Diverticulosis    Dysphagia, unspecified(787.20) 2004   Enthesopathy of ankle and tarsus, unspecified 03/31/96   External hemorrhoids without mention  of complication    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 Lumbo-sacral anterolisthesis     Senile osteoporosis 04/01/03   Sprain of metatarsophalangeal (joint) of foot    Thyrotoxicosis    Thyrotoxicosis without mention of goiter or other cause, without mention of thyrotoxic crisis or storm    Unspecified vitamin D deficiency    Urinary frequency     Past Surgical History:  Procedure Laterality Date   COLONOSCOPY     COLONOSCOPY N/A 04/15/2015   Procedure: COLONOSCOPY;  Surgeon: Gatha Mayer, MD;  Location: [place];  Service: Endoscopy;  Laterality: N/A;   DG FINGERS, MULTIPLE LT HAND ([place] [place] HX)     FOOT SURGERY Bilateral 05/22/2014   Dr.Strom    LUMBAR DISC SURGERY  12/12/2017   Dr Arnoldo Morale   REMOVE GANGLION  1998   DR [facility]    RIGHT WRIST SUGERY FOR TENOSYNOVITIS  1997   DR [place]    TONSILLECTOMY AND ADENOIDECTOMY  1974   TUBAL LIGATION  1973    Social History Oretha Cheek Wickard  reports that she has never smoked. She has never used smokeless tobacco. She  reports that she does not drink alcohol and does not use drugs.  family history includes Heart attack in her father; Heart disease in her father.  Allergies  Allergen Reactions   Arava [Leflunomide] Diarrhea, Nausea Only and Other (See Comments)    Loss appetite   Penicillins Rash and Other (See Comments)    PATIENT HAS HAD A PCN REACTION WITH IMMEDIATE RASH, FACIAL/TONGUE/THROAT SWELLING, SOB, OR LIGHTHEADEDNESS WITH HYPOTENSION:  #  #  YES  #  #  Has patient had a PCN reaction causing severe rash involving mucus membranes or skin necrosis: no Has patient had a PCN  reaction that required hospitalization: no    Methotrexate Other (See Comments)   Penicillamine Other (See Comments)   Methotrexate Derivatives Other (See Comments)    Hair loss   Neurontin [Gabapentin] Nausea And Vomiting       PHYSICAL EXAMINATION: Vital signs: BP 112/60   Pulse 70   Ht '4\' 6"'$  (1.372 m)   Wt 99 lb (44.9 kg)   BMI 23.87 kg/m   Constitutional: Pleasant, short thin and frail female, no acute distress Psychiatric: alert and oriented x3, cooperative Eyes: extraocular movements intact, anicteric, conjunctiva pink Mouth: oral pharynx moist, no lesions Neck: supple no lymphadenopathy Cardiovascular: heart regular rate and rhythm, no murmur Lungs: clear to auscultation bilaterally Abdomen: soft, nontender, nondistended, no obvious ascites, no peritoneal signs, normal bowel sounds, no organomegaly Rectal: Omitted Extremities: no clubbing or cyanosis.  2+ pitting lower extremity edema bilaterally.  Arthritic deformities of the hands Skin: no lesions on visible extremities save ecchymoses Neuro: No focal deficits.  Cranial nerves intact  ASSESSMENT:  1.  Microscopic colitis.  Complaining of diarrhea.  On budesonide 3 mg daily.  Also takes Lomotil 2 each morning. 2.  GERD complicated by peptic stricture.  Doing well swallowing except for large pills.  Cutting large pills, which helps. 3.  Iron deficiency anemia secondary to NSAID induced ulcers.  On PPI.  Being followed by hematology.  Good response to IV iron.   PLAN:  1.  Increase budesonide to 9 mg daily.  Prescription refilled.  Medication risk reviewed 2.  Continue omeprazole 3.  Continue to follow with hematology regarding iron deficiency anemia 4.  Talk to PCP about lower extremity edema.  May need diuretic.  She tells me she has been on Lasix in the past. 5.  Tramadol for pain as a good option given her history of NSAID induced ulcers.  Being prescribed by her back surgeon. 6.  I asked him to call me in 1  month with an update on 9 mg daily of budesonide.  I will give them further directions at that time regarding either medication adjustments and/or office follow-up. Total time of 30 minutes was spent preparing to see the patient, reviewing data, obtaining history, performing medically appropriate physical examination, counseling the patient and her husband regarding the above listed issues, ordering medication, direct follow-up plans, and documenting clinical information in the health record

## 2022-09-08 NOTE — Patient Instructions (Addendum)
_______________________________________________________  If you are age 76 or older, your body mass index should be between 23-30. Your Body mass index is 23.87 kg/m. If this is out of the aforementioned range listed, please consider follow up with your Primary Care Provider.  If you are age 63 or younger, your body mass index should be between 19-25. Your Body mass index is 23.87 kg/m. If this is out of the aformentioned range listed, please consider follow up with your Primary Care Provider.   ________________________________________________________  The [facility] providers would like to encourage you to use [facility] to communicate with providers for non-urgent requests or questions.  Due to long hold times on the telephone, sending your provider a message by [facility] may be a faster and more efficient way to get a response.  Please allow 48 business hours for a response.  Please remember that this is for non-urgent requests.  _______________________________________________________  We have sent the following medications to your pharmacy for you to pick up at your convenience:  Budesonide  Take 3 a day and call in a month to let us know how the diarrhea is

## 2022-09-12 ENCOUNTER — Other Ambulatory Visit: Payer: Medicare PPO

## 2022-09-12 DIAGNOSIS — M4317 Spondylolisthesis, lumbosacral region: Secondary | ICD-10-CM | POA: Diagnosis not present

## 2022-09-13 ENCOUNTER — Other Ambulatory Visit: Payer: Self-pay | Admitting: Nurse Practitioner

## 2022-09-13 ENCOUNTER — Other Ambulatory Visit: Payer: Self-pay

## 2022-09-14 ENCOUNTER — Other Ambulatory Visit: Payer: Medicare PPO

## 2022-09-14 NOTE — Telephone Encounter (Signed)
Requested medication is not on active medication list

## 2022-09-15 LAB — PTH, INTACT AND CALCIUM
Calcium: 8 mg/dL — ABNORMAL LOW (ref 8.6–10.4)
PTH: 122 pg/mL — ABNORMAL HIGH (ref 16–77)

## 2022-09-19 ENCOUNTER — Telehealth: Payer: Self-pay | Admitting: *Deleted

## 2022-09-19 NOTE — Telephone Encounter (Signed)
Patient called requesting recent labs to be faxed to Endocrinologist Dr. Daine Floras at [facility] faxed as requested through [facility].

## 2022-09-20 ENCOUNTER — Other Ambulatory Visit: Payer: Self-pay | Admitting: Family Medicine

## 2022-09-20 DIAGNOSIS — I10 Essential (primary) hypertension: Secondary | ICD-10-CM

## 2022-09-21 NOTE — Telephone Encounter (Signed)
Patient called and stated that Dr. Daine Floras did not receive the lab results and requesting it to be faxed to [phone]  Faxed as patient requested.

## 2022-10-06 ENCOUNTER — Telehealth: Payer: Self-pay | Admitting: Internal Medicine

## 2022-10-06 NOTE — Telephone Encounter (Signed)
Called patient regarding upcoming February appointments, left a voicemail.

## 2022-10-08 ENCOUNTER — Other Ambulatory Visit: Payer: Self-pay | Admitting: Nurse Practitioner

## 2022-10-09 DIAGNOSIS — L97911 Non-pressure chronic ulcer of unspecified part of right lower leg limited to breakdown of skin: Secondary | ICD-10-CM | POA: Diagnosis not present

## 2022-10-09 DIAGNOSIS — I8310 Varicose veins of unspecified lower extremity with inflammation: Secondary | ICD-10-CM | POA: Diagnosis not present

## 2022-10-10 DIAGNOSIS — H25813 Combined forms of age-related cataract, bilateral: Secondary | ICD-10-CM | POA: Diagnosis not present

## 2022-10-10 DIAGNOSIS — H25811 Combined forms of age-related cataract, right eye: Secondary | ICD-10-CM | POA: Diagnosis not present

## 2022-10-10 DIAGNOSIS — Z01818 Encounter for other preprocedural examination: Secondary | ICD-10-CM | POA: Diagnosis not present

## 2022-10-12 ENCOUNTER — Inpatient Hospital Stay: Payer: Medicare PPO | Admitting: Internal Medicine

## 2022-10-12 ENCOUNTER — Inpatient Hospital Stay: Payer: Medicare PPO | Attending: Physician Assistant

## 2022-10-12 ENCOUNTER — Other Ambulatory Visit: Payer: Self-pay

## 2022-10-12 VITALS — BP 149/65 | HR 75 | Temp 98.4°F | Resp 15 | Wt 98.7 lb

## 2022-10-12 DIAGNOSIS — K922 Gastrointestinal hemorrhage, unspecified: Secondary | ICD-10-CM | POA: Insufficient documentation

## 2022-10-12 DIAGNOSIS — D500 Iron deficiency anemia secondary to blood loss (chronic): Secondary | ICD-10-CM | POA: Insufficient documentation

## 2022-10-12 DIAGNOSIS — D509 Iron deficiency anemia, unspecified: Secondary | ICD-10-CM

## 2022-10-12 DIAGNOSIS — D508 Other iron deficiency anemias: Secondary | ICD-10-CM

## 2022-10-12 LAB — CBC WITH DIFFERENTIAL (CANCER CENTER ONLY)
Abs Immature Granulocytes: 0.07 10*3/uL (ref 0.00–0.07)
Basophils Absolute: 0 10*3/uL (ref 0.0–0.1)
Basophils Relative: 0 %
Eosinophils Absolute: 0 10*3/uL (ref 0.0–0.5)
Eosinophils Relative: 0 %
HCT: 35.7 % — ABNORMAL LOW (ref 36.0–46.0)
Hemoglobin: 11.7 g/dL — ABNORMAL LOW (ref 12.0–15.0)
Immature Granulocytes: 1 %
Lymphocytes Relative: 11 %
Lymphs Abs: 1.2 10*3/uL (ref 0.7–4.0)
MCH: 31.8 pg (ref 26.0–34.0)
MCHC: 32.8 g/dL (ref 30.0–36.0)
MCV: 97 fL (ref 80.0–100.0)
Monocytes Absolute: 1.1 10*3/uL — ABNORMAL HIGH (ref 0.1–1.0)
Monocytes Relative: 10 %
Neutro Abs: 8.2 10*3/uL — ABNORMAL HIGH (ref 1.7–7.7)
Neutrophils Relative %: 78 %
Platelet Count: 306 10*3/uL (ref 150–400)
RBC: 3.68 MIL/uL — ABNORMAL LOW (ref 3.87–5.11)
RDW: 13.3 % (ref 11.5–15.5)
WBC Count: 10.6 10*3/uL — ABNORMAL HIGH (ref 4.0–10.5)
nRBC: 0 % (ref 0.0–0.2)

## 2022-10-12 LAB — FERRITIN: Ferritin: 85 ng/mL (ref 11–307)

## 2022-10-12 LAB — IRON AND IRON BINDING CAPACITY (CC-WL,HP ONLY)
Iron: 46 ug/dL (ref 28–170)
Saturation Ratios: 16 % (ref 10.4–31.8)
TIBC: 280 ug/dL (ref 250–450)
UIBC: 234 ug/dL (ref 148–442)

## 2022-10-12 NOTE — Progress Notes (Signed)
[facility] [phone]   [phone]  OFFICE PROGRESS NOTE  Wardell Honour, MD [place] [address]  DIAGNOSIS: Iron deficiency anemia secondary to gastrointestinal blood loss.  PRIOR THERAPY: Iron infusion with Venofer 300 Mg IV weekly for 3 weeks.  CURRENT THERAPY: None  INTERVAL HISTORY: Joan [name] 77 y.o. female returns to the clinic today for follow-up visit accompanied by her husband.  The patient is feeling fine today with no concerning complaints.  She denied having any significant fatigue or weakness.  She has no dizzy spells.  She denied having any chest pain, shortness of breath, cough or hemoptysis.  She has no nausea, vomiting, diarrhea or constipation.  She is here today for evaluation and repeat blood work.  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 Lumbo-sacral anterolisthesis     Senile osteoporosis 04/01/03   Sprain of metatarsophalangeal (joint) of foot    Thyrotoxicosis    Thyrotoxicosis without mention of goiter or other cause, without mention of thyrotoxic crisis or storm     Unspecified vitamin D deficiency    Urinary frequency     ALLERGIES:  is allergic to arava [leflunomide], penicillins, methotrexate, penicillamine, methotrexate derivatives, and neurontin [gabapentin].  MEDICATIONS:  Current Outpatient Medications  Medication Sig Dispense Refill   acetaminophen (TYLENOL) 650 MG CR tablet Take 1,300 mg by mouth at bedtime.     amitriptyline (ELAVIL) 10 MG tablet TAKE 1 TABLET BY MOUTH ONCE DAILY AT BEDTIME FOR MUSCLE RELAXATION 90 tablet 0   Biotin 1000 MCG tablet Take 1 tablet (1 mg total) by mouth 2 (two) times daily. 60 tablet 3   budesonide (ENTOCORT EC) 3 MG 24 hr capsule Take 3 capsules (9 mg total) by mouth daily. 270 capsule 3   Calcium Carb-Cholecalciferol (CALTRATE 600+D3) 600-800 MG-UNIT TABS Take 1 tablet by mouth 2 (two) times daily. 60 tablet 11   Cholecalciferol (VITAMIN D3) 50 MCG (2000 UT) capsule Take 1 capsule (2,000 Units total) by mouth daily. 30 capsule 3   citalopram (CELEXA) 10 MG tablet Take 1 tablet (10 mg total) by mouth daily. 30 tablet 3   cycloSPORINE (RESTASIS) 0.05 % ophthalmic emulsion Place 1 drop into both eyes 2 (two) times daily.     denosumab (PROLIA) 60 MG/ML SOSY injection Inject 60 mg into the skin every 6 (six) months. 1 mL 1   diphenoxylate-atropine (LOMOTIL) 2.5-0.025 MG tablet TAKE 1 TO 2 TABLETS BY MOUTH EVERY 8 HOURS AS NEEDED 60 tablet 3   etanercept (ENBREL) 50 MG/ML injection Inject 50 mg into the skin  once a week.     fluticasone (FLONASE) 50 MCG/ACT nasal spray Place 1 spray into both nostrils daily for 14 days. 1 g 0   folic acid (FOLVITE) 1 MG tablet Take 1 mg by mouth daily.     furosemide (LASIX) 20 MG tablet Take 1 tablet (20 mg total) by mouth daily. 90 tablet 3   Glucosamine-Chondroit-Vit C-Mn (GLUCOSAMINE CHONDR 500 COMPLEX PO) Take 2 tablets by mouth daily.      hydroxychloroquine (PLAQUENIL) 200 MG tablet      losartan (COZAAR) 100 MG tablet Take 1 tablet by mouth once daily 90 tablet 0    Menthol, Topical Analgesic, (ASPERCREME MAX ROLL-ON EX) Apply topically.     minocycline (MINOCIN) 100 MG capsule Take 100 mg by mouth 2 (two) times daily.     Multiple Vitamin (MULTIVITAMIN) tablet Take 1 tablet by mouth daily.      naproxen (NAPROSYN) 500 MG tablet Take 1 tablet (500 mg total) by mouth in the morning and at bedtime. (Patient taking differently: Take 500 mg by mouth daily.) 180 tablet 2   Omega-3 Fatty Acids (FISH OIL) 1000 MG CAPS Take 1,000 mg by mouth daily.      omeprazole (PRILOSEC) 40 MG capsule Take 1 capsule by mouth once daily 90 capsule 6   predniSONE (DELTASONE) 5 MG tablet TAKE 1 TABLET BY MOUTH WITH BREAKFAST 90 tablet 0   Propylene Glycol 0.6 % SOLN Place 1 drop into both eyes 2 (two) times daily as needed (dry eyes).      TIROSINT 50 MCG CAPS Take 1 tablet by mouth 3 (three) times a week.     traMADol (ULTRAM) 50 MG tablet Take 1 tablet (50 mg total) by mouth 2 (two) times daily as needed. 60 tablet 5   tretinoin (RETIN-A) 0.025 % cream tretinoin 0.025 % topical cream  APPLY TO FACE AT BEDTIME     urea (CARMOL) 10 % cream Apply topically as needed.     zinc gluconate 50 MG tablet Take by mouth as needed.     No current facility-administered medications for this visit.    SURGICAL HISTORY:  Past Surgical History:  Procedure Laterality Date   COLONOSCOPY     COLONOSCOPY N/A 04/15/2015   Procedure: COLONOSCOPY;  Surgeon: Gatha Mayer, MD;  Location: [facility];  Service: Endoscopy;  Laterality: N/A;   DG FINGERS, MULTIPLE LT HAND ([facility] HX)     FOOT SURGERY Bilateral 05/22/2014   Dr.Strom    LUMBAR DISC SURGERY  12/12/2017   Dr Arnoldo Morale   REMOVE GANGLION  1998   DR [facility]    RIGHT WRIST SUGERY FOR TENOSYNOVITIS  1997   DR [place]   TUBAL LIGATION  1973    REVIEW OF SYSTEMS:  A comprehensive review of systems was negative.   PHYSICAL EXAMINATION: General appearance: alert, cooperative, and no distress Head:  Normocephalic, without obvious abnormality, atraumatic Neck: no adenopathy, no JVD, supple, symmetrical, trachea midline, and thyroid not enlarged, symmetric, no tenderness/mass/nodules Lymph nodes: Cervical, supraclavicular, and axillary nodes normal. Resp: clear to auscultation bilaterally Back: symmetric, no curvature. ROM normal. No CVA tenderness. Cardio: regular rate and rhythm, S1, S2 normal, no murmur, click, rub or gallop GI: soft, non-tender; bowel sounds normal; no masses,  no organomegaly Extremities: extremities normal, atraumatic, no cyanosis or edema  ECOG PERFORMANCE STATUS: 1 - Symptomatic but completely ambulatory  Blood pressure (!) 149/65, pulse 75, temperature 98.4 F (36.9 C), temperature  source Oral, resp. rate 15, weight 98 lb 11.2 oz (44.8 kg), SpO2 99 %.  LABORATORY DATA: Lab Results  Component Value Date   WBC 10.6 (H) 10/12/2022   HGB 11.7 (L) 10/12/2022   HCT 35.7 (L) 10/12/2022   MCV 97.0 10/12/2022   PLT 306 10/12/2022      Chemistry      Component Value Date/Time   NA 140 08/30/2022 0920   NA 143 02/02/2016 0840   K 4.4 08/30/2022 0920   CL 109 08/30/2022 0920   CO2 24 08/30/2022 0920   BUN 25 08/30/2022 0920   BUN 13 02/02/2016 0840   CREATININE 0.92 08/30/2022 0920      Component Value Date/Time   CALCIUM 8.0 (L) 09/14/2022 0911   ALKPHOS 47 04/11/2022 1309   AST 14 (L) 04/11/2022 1309   ALT 11 04/11/2022 1309   BILITOT 0.3 04/11/2022 1309       RADIOGRAPHIC STUDIES: No results found.  ASSESSMENT AND PLAN: This is a very pleasant 77 years old white female with history of iron deficiency anemia secondary to gastrointestinal blood loss.  The patient has intolerance to the oral iron tablets. She was treated with iron infusion with Venofer 300 mg IV weekly for 3 weeks and tolerated it well. The patient is currently on observation and she is feeling fine with no concerning complaints. She had repeat CBC today that showed a stable  hemoglobin of 11.7 and hematocrit of 35.7%.  Iron study and ferritin are still pending. I recommended for the patient to continue on observation with repeat CBC, iron study and ferritin in 3 months but I would consider her for iron infusion in the interval if she has low serum iron or ferritin on the pending studies. The patient was advised to call immediately if she has any other concerning symptoms in the interval. The patient voices understanding of current disease status and treatment options and is in agreement with the current care plan. All questions were answered. The patient knows to call the clinic with any problems, questions or concerns. We can certainly see the patient much sooner if necessary.  The total time spent in the appointment was 20 minutes.  Disclaimer: This note was dictated with voice recognition software. Similar sounding words can inadvertently be transcribed and may not be corrected upon review.

## 2022-10-26 ENCOUNTER — Telehealth: Payer: Self-pay | Admitting: Internal Medicine

## 2022-10-26 NOTE — Telephone Encounter (Signed)
Spoke with pt and she is aware of Dr. Perry's recommendations. 

## 2022-10-26 NOTE — Telephone Encounter (Signed)
Have her decrease budesonide to 6 mg daily (2 tabs daily) and call  the office with follow up on her condition in one month. Further recommendations at that time. Thanks

## 2022-10-26 NOTE — Telephone Encounter (Signed)
Inbound call from patient, wanting to let Dr. Henrene Pastor know that she was doing well after her visit. Stated he advised her to call a month after appointment for follow up instructions on medications and appointment.

## 2022-10-26 NOTE — Telephone Encounter (Signed)
See note below and advise. Per OV note pt was to call back and have further med instructions or OV.

## 2022-10-31 DIAGNOSIS — H25811 Combined forms of age-related cataract, right eye: Secondary | ICD-10-CM | POA: Diagnosis not present

## 2022-10-31 DIAGNOSIS — H04123 Dry eye syndrome of bilateral lacrimal glands: Secondary | ICD-10-CM | POA: Diagnosis not present

## 2022-10-31 DIAGNOSIS — H259 Unspecified age-related cataract: Secondary | ICD-10-CM | POA: Diagnosis not present

## 2022-10-31 DIAGNOSIS — H52223 Regular astigmatism, bilateral: Secondary | ICD-10-CM | POA: Diagnosis not present

## 2022-10-31 DIAGNOSIS — H353131 Nonexudative age-related macular degeneration, bilateral, early dry stage: Secondary | ICD-10-CM | POA: Diagnosis not present

## 2022-10-31 DIAGNOSIS — Z79899 Other long term (current) drug therapy: Secondary | ICD-10-CM | POA: Diagnosis not present

## 2022-11-07 DIAGNOSIS — M810 Age-related osteoporosis without current pathological fracture: Secondary | ICD-10-CM | POA: Diagnosis not present

## 2022-11-14 DIAGNOSIS — E039 Hypothyroidism, unspecified: Secondary | ICD-10-CM | POA: Diagnosis not present

## 2022-11-14 DIAGNOSIS — Z79899 Other long term (current) drug therapy: Secondary | ICD-10-CM | POA: Diagnosis not present

## 2022-11-14 DIAGNOSIS — H353131 Nonexudative age-related macular degeneration, bilateral, early dry stage: Secondary | ICD-10-CM | POA: Diagnosis not present

## 2022-11-14 DIAGNOSIS — M069 Rheumatoid arthritis, unspecified: Secondary | ICD-10-CM | POA: Diagnosis not present

## 2022-11-14 DIAGNOSIS — H353130 Nonexudative age-related macular degeneration, bilateral, stage unspecified: Secondary | ICD-10-CM | POA: Diagnosis not present

## 2022-11-14 DIAGNOSIS — H04123 Dry eye syndrome of bilateral lacrimal glands: Secondary | ICD-10-CM | POA: Diagnosis not present

## 2022-11-14 DIAGNOSIS — H259 Unspecified age-related cataract: Secondary | ICD-10-CM | POA: Diagnosis not present

## 2022-11-14 DIAGNOSIS — H52223 Regular astigmatism, bilateral: Secondary | ICD-10-CM | POA: Diagnosis not present

## 2022-11-14 DIAGNOSIS — H16221 Keratoconjunctivitis sicca, not specified as Sjogren's, right eye: Secondary | ICD-10-CM | POA: Diagnosis not present

## 2022-11-14 DIAGNOSIS — I10 Essential (primary) hypertension: Secondary | ICD-10-CM | POA: Diagnosis not present

## 2022-11-14 DIAGNOSIS — H25812 Combined forms of age-related cataract, left eye: Secondary | ICD-10-CM | POA: Diagnosis not present

## 2022-11-16 DIAGNOSIS — L97911 Non-pressure chronic ulcer of unspecified part of right lower leg limited to breakdown of skin: Secondary | ICD-10-CM | POA: Diagnosis not present

## 2022-11-16 DIAGNOSIS — L0390 Cellulitis, unspecified: Secondary | ICD-10-CM | POA: Diagnosis not present

## 2022-11-23 DIAGNOSIS — T148XXA Other injury of unspecified body region, initial encounter: Secondary | ICD-10-CM | POA: Diagnosis not present

## 2022-11-23 DIAGNOSIS — L0390 Cellulitis, unspecified: Secondary | ICD-10-CM | POA: Diagnosis not present

## 2022-11-23 DIAGNOSIS — L97911 Non-pressure chronic ulcer of unspecified part of right lower leg limited to breakdown of skin: Secondary | ICD-10-CM | POA: Diagnosis not present

## 2022-11-28 ENCOUNTER — Ambulatory Visit (INDEPENDENT_AMBULATORY_CARE_PROVIDER_SITE_OTHER): Payer: Medicare PPO | Admitting: Family

## 2022-11-28 ENCOUNTER — Encounter: Payer: Self-pay | Admitting: Family

## 2022-11-28 ENCOUNTER — Encounter: Payer: Medicare PPO | Admitting: Nurse Practitioner

## 2022-11-28 VITALS — BP 112/62 | HR 74 | Temp 97.1°F | Resp 16 | Ht <= 58 in | Wt 97.2 lb

## 2022-11-28 DIAGNOSIS — Z0000 Encounter for general adult medical examination without abnormal findings: Secondary | ICD-10-CM | POA: Diagnosis not present

## 2022-11-28 NOTE — Progress Notes (Signed)
Subjective:   Joan [name] is a 78 y.o. female who presents for Medicare Annual (Subsequent) preventive examination.  Review of Systems    Cardiac Risk Factors include: advanced age (>95men, >6 women);hypertension     Objective:    Today's Vitals   11/28/22 0924 11/28/22 0945  BP: 112/62   Pulse: 74   Resp: 16   Temp: (!) 97.1 F (36.2 C)   TempSrc: Temporal   SpO2: 96%   Weight: 97 lb 3.2 oz (44.1 kg)   Height: 4\' 7"  (1.397 m)   PainSc:  5    Body mass index is 22.59 kg/m.     11/28/2022    9:23 AM 04/11/2022    1:23 PM 11/22/2021    8:47 AM 11/16/2020    9:18 AM 08/09/2020    9:43 AM 03/29/2020    8:37 AM 11/24/2019    8:56 AM  Advanced Directives  Does Patient Have a Medical Advance Directive? No Yes No No No Yes No  Type of [employer] of [place];Living will       Does patient want to make changes to medical advance directive?  No - Patient declined       Copy of [place] in Chart?  No - copy requested       Would patient like information on creating a medical advance directive? No - Patient declined   No - Patient declined No - Patient declined  Yes (ED - Information included in AVS)    Current Medications (verified) Outpatient Encounter Medications as of 11/28/2022  Medication Sig   acetaminophen (TYLENOL) 650 MG CR tablet Take 1,300 mg by mouth at bedtime.   amitriptyline (ELAVIL) 10 MG tablet TAKE 1 TABLET BY MOUTH ONCE DAILY AT BEDTIME FOR MUSCLE RELAXATION   Biotin 1000 MCG tablet Take 1 tablet (1 mg total) by mouth 2 (two) times daily.   budesonide (ENTOCORT EC) 3 MG 24 hr capsule Take 3 capsules (9 mg total) by mouth daily.   Calcium Carb-Cholecalciferol (CALTRATE 600+D3) 600-800 MG-UNIT TABS Take 1 tablet by mouth 2 (two) times daily.   Cholecalciferol (VITAMIN D3) 50 MCG (2000 UT) capsule Take 1 capsule (2,000 Units total) by mouth daily.   citalopram (CELEXA) 10 MG tablet Take 1 tablet (10 mg total) by  mouth daily.   cycloSPORINE (RESTASIS) 0.05 % ophthalmic emulsion Place 1 drop into both eyes 2 (two) times daily.   denosumab (PROLIA) 60 MG/ML SOSY injection Inject 60 mg into the skin every 6 (six) months.   diphenoxylate-atropine (LOMOTIL) 2.5-0.025 MG tablet TAKE 1 TO 2 TABLETS BY MOUTH EVERY 8 HOURS AS NEEDED   etanercept (ENBREL) 50 MG/ML injection Inject 50 mg into the skin once a week.   fluticasone (FLONASE) 50 MCG/ACT nasal spray Place 1 spray into both nostrils daily for 14 days.   folic acid (FOLVITE) 1 MG tablet Take 1 mg by mouth daily.   furosemide (LASIX) 20 MG tablet Take 1 tablet (20 mg total) by mouth daily.   Glucosamine-Chondroit-Vit C-Mn (GLUCOSAMINE CHONDR 500 COMPLEX PO) Take 2 tablets by mouth daily.    hydroxychloroquine (PLAQUENIL) 200 MG tablet    losartan (COZAAR) 100 MG tablet Take 1 tablet by mouth once daily   Menthol, Topical Analgesic, (ASPERCREME MAX ROLL-ON EX) Apply topically.   minocycline (MINOCIN) 100 MG capsule Take 100 mg by mouth 2 (two) times daily.   Multiple Vitamin (MULTIVITAMIN) tablet Take 1 tablet by mouth daily.  naproxen (NAPROSYN) 500 MG tablet Take 1 tablet (500 mg total) by mouth in the morning and at bedtime. (Patient taking differently: Take 500 mg by mouth daily.)   Omega-3 Fatty Acids (FISH OIL) 1000 MG CAPS Take 1,000 mg by mouth daily.    omeprazole (PRILOSEC) 40 MG capsule Take 1 capsule by mouth once daily   predniSONE (DELTASONE) 5 MG tablet TAKE 1 TABLET BY MOUTH WITH BREAKFAST   Propylene Glycol 0.6 % SOLN Place 1 drop into both eyes 2 (two) times daily as needed (dry eyes).    TIROSINT 50 MCG CAPS Take 1 tablet by mouth 3 (three) times a week.   traMADol (ULTRAM) 50 MG tablet Take 1 tablet (50 mg total) by mouth 2 (two) times daily as needed.   tretinoin (RETIN-A) 0.025 % cream tretinoin 0.025 % topical cream  APPLY TO FACE AT BEDTIME   urea (CARMOL) 10 % cream Apply topically as needed.   zinc gluconate 50 MG tablet Take  by mouth as needed.   No facility-administered encounter medications on file as of 11/28/2022.    Allergies (verified) Arava [leflunomide], Penicillins, Methotrexate, Penicillamine, Methotrexate derivatives, and Neurontin [gabapentin]   History: Past Medical History:  Diagnosis Date   Alopecia areata 02/16/2006   Anxiety state, unspecified 2004   Benign paroxysmal positional vertigo 04/01/03   Cancer ([place])    skin    Cervicalgia 04/01/03   Chronic rhinitis 09/01/05   Corns and callosities    Dermatophytosis of nail    Diarrhea 04/15/2015   Diverticulosis    Dysphagia, unspecified(787.20) 2004   Enthesopathy of ankle and tarsus, unspecified 03/31/96   External hemorrhoids without mention of complication    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 Lumbo-sacral anterolisthesis     Senile osteoporosis 04/01/03   Sprain of metatarsophalangeal (joint) of foot    Thyrotoxicosis    Thyrotoxicosis without mention of goiter or other cause, without mention of thyrotoxic crisis or storm    Unspecified vitamin D deficiency    Urinary frequency    Past Surgical History:  Procedure Laterality Date   COLONOSCOPY     COLONOSCOPY N/A 04/15/2015   Procedure: COLONOSCOPY;  Surgeon: Gatha Mayer, MD;  Location: [place];  Service: Endoscopy;  Laterality: N/A;   DG FINGERS, MULTIPLE LT HAND ([facility] HX)     EYE SURGERY     FOOT SURGERY Bilateral 05/22/2014   Dr.Strom    LUMBAR DISC SURGERY  12/12/2017   Dr Arnoldo Morale   REMOVE GANGLION  1998   DR [facility]    RIGHT WRIST SUGERY FOR TENOSYNOVITIS  1997   DR SYPHER    TONSILLECTOMY AND ADENOIDECTOMY   1974   TUBAL LIGATION  1973   Family History  Problem Relation Age of Onset   Heart attack Father        age 79   Heart disease Father    Colon cancer Neg Hx    Esophageal cancer Neg Hx    Rectal cancer Neg Hx    Stomach cancer Neg Hx    Social History   Socioeconomic History   Marital status: Married  Spouse name: Percell Miller   Number of children: 2   Years of education: Not on file   Highest education level: Not on file  Occupational History   Occupation: retired  Tobacco Use   Smoking status: Never   Smokeless tobacco: Never  Vaping Use   Vaping Use: Never used  Substance and Sexual Activity   Alcohol use: No    Alcohol/week: 0.0 standard drinks of alcohol   Drug use: No   Sexual activity: Yes  Other Topics Concern   Not on file  Social History Narrative   Not on file   Social Determinants of Health   Financial Resource Strain: Low Risk  (10/22/2017)   Overall Financial Resource Strain (CARDIA)    Difficulty of Paying Living Expenses: Not hard at all  Food Insecurity: No Food Insecurity (10/22/2017)   Hunger Vital Sign    Worried About Running Out of Food in the Last Year: Never true    [place] in the Last Year: Never true  Transportation Needs: No Transportation Needs (10/22/2017)   PRAPARE - Hydrologist (Medical): No    Lack of Transportation (Non-Medical): No  Physical Activity: Inactive (10/22/2017)   Exercise Vital Sign    Days of Exercise per Week: 0 days    Minutes of Exercise per Session: 0 min  Stress: Stress Concern Present (10/22/2017)   [place]    Feeling of Stress : Very much  Social Connections: Moderately Integrated (10/22/2017)   Social Connection and Isolation Panel [NHANES]    Frequency of Communication with Friends and Family: More than three times a week    Frequency of Social Gatherings with Friends and Family: More than three times a  week    Attends Religious Services: More than 4 times per year    Active Member of [employer] or Organizations: No    Attends Music therapist: Never    Marital Status: Married    Tobacco Counseling Counseling given: Not Answered   Clinical Intake:  Pre-visit preparation completed: No  Pain : 0-10 Pain Score: 5  Pain Type: Chronic pain Pain Location: Back (fibromylagia and Osteoarthritis) Pain Orientation: Lower Pain Radiating Towards: No Pain Descriptors / Indicators: Aching Pain Onset: More than a month ago (several years) Pain Frequency: Intermittent Pain Relieving Factors: Tramadol Effect of Pain on Daily Activities: No  Pain Relieving Factors: Tramadol  BMI - recorded: 22.59 Nutritional Status: BMI of 19-24  Normal Nutritional Risks: None  How often do you need to have someone help you when you read instructions, pamphlets, or other written materials from your doctor or pharmacy?: 1 - Never What is the last grade level you completed in school?: 12 Grade  Diabetic?No   Interpreter Needed?: No      Activities of Daily Living    11/28/2022    9:51 AM 11/28/2022    9:23 AM  In your present state of health, do you have any difficulty performing the following activities:  Hearing? 0 0  Vision? 1 0  Comment left eye blurry vision post cataract surgery wears eye glasses   Difficulty concentrating or making decisions? 0 0  Walking or climbing stairs? 0 0  Dressing or bathing? 0 0  Doing errands, shopping? 0 0  Preparing Food and eating ? N   Using the Toilet? N   In the past six months, have you accidently leaked urine? N   Do you  have problems with loss of bowel control? N   Managing your Medications? N   Managing your Finances? N   Housekeeping or managing your Housekeeping? N     Patient Care Team: Wardell Honour, MD as PCP - General (Family Medicine) Jerline Pain, MD as PCP - Cardiology (Cardiology) Hennie Duos, MD as Consulting  Physician (Rheumatology) Altheimer, Legrand Como, MD as Consulting Physician (Endocrinology) Newman Pies, MD as Consulting Physician (Neurosurgery) Clydell Hakim, MD (Inactive) as Consulting Physician (Anesthesiology) Jola Baptist Madie Reno, MD (Inactive) as Consulting Physician (Diagnostic Radiology) Jolene Schimke, MD as Referring Physician (Dermatology) Lynnell Dike, OD as Consulting Physician (Optometry) Marcene Corning, MD as Referring Physician (Internal Medicine)  Indicate any recent Medical Services you may have received from other than [facility] providers in the past year (date may be approximate).     Assessment:   This is a routine wellness examination for Joan [name].  Hearing/Vision screen No results found.  Dietary issues and exercise activities discussed: Current Exercise Habits: Home exercise routine, Type of exercise: walking, Time (Minutes): 30, Frequency (Times/Week): 3, Weekly Exercise (Minutes/Week): 90, Intensity: Moderate, Exercise limited by: orthopedic condition(s) (arthritis)   Goals Addressed             This Visit's Progress    Increase water intake   On track    Starting 10/31/16, I will attempt to increase my water intake by 2 more glasses daily, for a total of 6 glasses per day.       Depression Screen    11/28/2022    9:23 AM 11/22/2021    8:44 AM 11/16/2020    9:15 AM 08/09/2020    9:43 AM 03/29/2020    8:37 AM 11/24/2019    8:55 AM 11/11/2019    1:30 PM  PHQ 2/9 Scores  PHQ - 2 Score 0 0 0 0 0 0 0    Fall Risk    11/28/2022    9:22 AM 11/22/2021    8:45 AM 11/01/2021    8:28 AM 04/26/2021    8:47 AM 11/16/2020    9:15 AM  Fall Risk   Falls in the past year? 0 0 0 0 0  Number falls in past yr: 0 0 0 0 0  Injury with Fall? 0 0 0 0 0  Risk for fall due to :  No Fall Risks No Fall Risks History of fall(s)   Follow up Falls evaluation completed;Education provided;Falls prevention discussed Falls evaluation completed Falls evaluation completed;Education  provided;Falls prevention discussed Falls evaluation completed;Education provided;Falls prevention discussed     FALL RISK PREVENTION PERTAINING TO THE HOME:  Any stairs in or around the home? No  If so, are there any without handrails? No  Home free of loose throw rugs in walkways, pet beds, electrical cords, etc? No  Adequate lighting in your home to reduce risk of falls? Yes   ASSISTIVE DEVICES UTILIZED TO PREVENT FALLS:  Life alert? No  Use of a cane, walker or w/c? No  Grab bars in the bathroom? No  Shower chair or bench in shower? No  Elevated toilet seat or a handicapped toilet? No   TIMED UP AND GO:  Was the test performed? Yes .  Length of time to ambulate 10 feet: 9 sec.   Gait steady and fast without use of assistive device  Cognitive Function:    11/28/2022    9:28 AM 11/05/2018    9:09 AM 10/22/2017    8:58 AM 10/31/2016  2:18 PM 10/31/2016    2:17 PM  MMSE - Mini Mental State Exam  Orientation to time 5 5 5 5 5   Orientation to Place 5 5 5 5 5   Registration 3 3 3 3    Attention/ Calculation 5 5 5 5    Recall 3 2 2 2    Language- name 2 objects 2 2 2 2    Language- repeat 1 1 1 1    Language- follow 3 step command 3 3 3 2    Language- read & follow direction 1 1 1 1    Write a sentence 1 1 1 1    Copy design 1 1 1 1    Total score 30 29 29 28          11/22/2021    8:47 AM 11/16/2020    9:16 AM 11/11/2019    1:31 PM  6CIT Screen  What Year? 0 points 0 points 0 points  What month? 0 points 0 points 0 points  What time? 0 points 0 points 0 points  Count back from 20 0 points 0 points 0 points  Months in reverse 0 points 0 points 0 points  Repeat phrase 2 points 0 points 4 points  Total Score 2 points 0 points 4 points    Immunizations Immunization History  Administered Date(s) Administered   Fluad Quad(high Dose 65+) 05/22/2019, 08/09/2020, 06/28/2022   Influenza Split 07/05/2020   Influenza, High Dose Seasonal PF 06/04/2013, 06/24/2014, 07/06/2015,  05/05/2016, 06/13/2016, 06/25/2017, 07/08/2018, 05/22/2019   Influenza-Unspecified 06/04/2013, 06/24/2014, 07/06/2015, 05/05/2016, 06/13/2016, 06/25/2017, 07/08/2018, 07/05/2021   Moderna Sars-Covid-2 Vaccination 10/13/2019, 11/10/2019, 09/10/2020   Pneumococcal Conjugate-13 09/04/2006, 10/06/2015   Pneumococcal Polysaccharide-23 10/31/2016   Pneumococcal-Unspecified 09/04/2006   Td 09/04/2005   Tdap 09/04/2005, 09/04/2016   Zoster Recombinat (Shingrix) 02/19/2018, 07/15/2018   Zoster, Live 11/10/2008   Zoster, Unspecified 02/19/2018, 07/15/2018    TDAP status: Up to date  Flu Vaccine status: Up to date  Pneumococcal vaccine status: Up to date  Covid-19 vaccine status: Declined, Education has been provided regarding the importance of this vaccine but patient still declined. Advised may receive this vaccine at local pharmacy or Health Dept.or vaccine clinic. Aware to provide a copy of the vaccination record if obtained from local pharmacy or Health Dept. Verbalized acceptance and understanding.  Qualifies for Shingles Vaccine? Yes   Zostavax completed Yes   Shingrix Completed?: Yes  Screening Tests Health Maintenance  Topic Date Due   COVID-19 Vaccine (4 - 2023-24 season) 12/14/2022 (Originally 05/05/2022)   Medicare Annual Wellness (AWV)  11/28/2023   DTaP/Tdap/Td (4 - Td or Tdap) 09/04/2026   COLONOSCOPY (Pts 45-35yrs Insurance coverage will need to be confirmed)  08/26/2029   Pneumonia Vaccine 40+ Years old  Completed   INFLUENZA VACCINE  Completed   DEXA SCAN  Completed   Hepatitis C Screening  Completed   Zoster Vaccines- Shingrix  Completed   HPV VACCINES  Aged Out    Health Maintenance  There are no preventive care reminders to display for this patient.  Colorectal cancer screening: No longer required.   Mammogram status: No longer required due to age .  Bone Density status: Completed 05/22/2022 . Results reflect: Bone density results: OSTEOPOROSIS. Repeat every 2  years.  Lung Cancer Screening: (Low Dose CT Chest recommended if Age 30-80 years, 30 pack-year currently smoking OR have quit w/in 15years.) does not qualify.   Lung Cancer Screening Referral: No   Additional Screening:  Hepatitis C Screening: does not qualify; Completed Yes   Vision  Screening: Recommended annual ophthalmology exams for early detection of glaucoma and other disorders of the eye. Is the patient up to date with their annual eye exam?  Yes  Who is the provider or what is the name of the office in which the patient attends annual eye exams? Could not remember Ophthalmologist name  If pt is not established with a provider, would they like to be referred to a provider to establish care? No .   Dental Screening: Recommended annual dental exams for proper oral hygiene  Community Resource Referral / Chronic Care Management: CRR required this visit?  No   CCM required this visit?  No      Plan:     I have personally reviewed and noted the following in the patient's chart:   Medical and social history Use of alcohol, tobacco or illicit drugs  Current medications and supplements including opioid prescriptions. Patient is currently taking opioid prescriptions. Information provided to patient regarding non-opioid alternatives. Patient advised to discuss non-opioid treatment plan with their provider. Functional ability and status Nutritional status Physical activity Advanced directives List of other physicians Hospitalizations, surgeries, and ER visits in previous 12 months Vitals Screenings to include cognitive, depression, and falls Referrals and appointments  In addition, I have reviewed and discussed with patient certain preventive protocols, quality metrics, and best practice recommendations. A written personalized care plan for preventive services as well as general preventive health recommendations were provided to patient.     Sandrea Hughs, NP   11/28/2022    Nurse Notes: Up date

## 2022-11-28 NOTE — Patient Instructions (Signed)
[name] , Thank you for taking time to come for your Medicare Wellness Visit. I appreciate your ongoing commitment to your health goals. Please review the following plan we discussed and let me know if I can assist you in the future.   Screening recommendations/referrals: Colonoscopy : Up to date  Mammogram  : Up to date  Bone Density  : Up to date  Recommended yearly ophthalmology/optometry visit for glaucoma screening and checkup Recommended yearly dental visit for hygiene and checkup  Vaccinations: Influenza vaccine- due annually in September/October Pneumococcal vaccine  : Up to date  Tdap vaccine  : Up to date  Shingles vaccine  : Up to date     Advanced directives: No   Conditions/risks identified:  advanced age (>33men, >13 women);hypertension  Next appointment: 1 year    Preventive Care 52 Years and Older, Female Preventive care refers to lifestyle choices and visits with your health care provider that can promote health and wellness. What does preventive care include? A yearly physical exam. This is also called an annual well check. Dental exams once or twice a year. Routine eye exams. Ask your health care provider how often you should have your eyes checked. Personal lifestyle choices, including: Daily care of your teeth and gums. Regular physical activity. Eating a healthy diet. Avoiding tobacco and drug use. Limiting alcohol use. Practicing safe sex. Taking low-dose aspirin every day. Taking vitamin and mineral supplements as recommended by your health care provider. What happens during an annual well check? The services and screenings done by your health care provider during your annual well check will depend on your age, overall health, lifestyle risk factors, and family history of disease. Counseling  Your health care provider may ask you questions about your: Alcohol use. Tobacco use. Drug use. Emotional well-being. Home and relationship  well-being. Sexual activity. Eating habits. History of falls. Memory and ability to understand (cognition). Work and work Statistician. Reproductive health. Screening  You may have the following tests or measurements: Height, weight, and BMI. Blood pressure. Lipid and cholesterol levels. These may be checked every 5 years, or more frequently if you are over 76 years old. Skin check. Lung cancer screening. You may have this screening every year starting at age 76 if you have a 30-pack-year history of smoking and currently smoke or have quit within the past 15 years. Fecal occult blood test (FOBT) of the stool. You may have this test every year starting at age 76. Flexible sigmoidoscopy or colonoscopy. You may have a sigmoidoscopy every 5 years or a colonoscopy every 10 years starting at age 76. Hepatitis C blood test. Hepatitis B blood test. Sexually transmitted disease (STD) testing. Diabetes screening. This is done by checking your blood sugar (glucose) after you have not eaten for a while (fasting). You may have this done every 1-3 years. Bone density scan. This is done to screen for osteoporosis. You may have this done starting at age 76. Mammogram. This may be done every 1-2 years. Talk to your health care provider about how often you should have regular mammograms. Talk with your health care provider about your test results, treatment options, and if necessary, the need for more tests. Vaccines  Your health care provider may recommend certain vaccines, such as: Influenza vaccine. This is recommended every year. Tetanus, diphtheria, and acellular pertussis (Tdap, Td) vaccine. You may need a Td booster every 10 years. Zoster vaccine. You may need this after age 26. Pneumococcal 13-valent conjugate (PCV13) vaccine. One  dose is recommended after age 76. Pneumococcal polysaccharide (PPSV23) vaccine. One dose is recommended after age 76. Talk to your health care provider about which  screenings and vaccines you need and how often you need them. This information is not intended to replace advice given to you by your health care provider. Make sure you discuss any questions you have with your health care provider. Document Released: 09/17/2015 Document Revised: 05/10/2016 Document Reviewed: 06/22/2015 Elsevier Interactive Patient Education  2017 [employer] Prevention in the Home Falls can cause injuries. They can happen to people of all ages. There are many things you can do to make your home safe and to help prevent falls. What can I do on the outside of my home? Regularly fix the edges of walkways and driveways and fix any cracks. Remove anything that might make you trip as you walk through a door, such as a raised step or threshold. Trim any bushes or trees on the path to your home. Use bright outdoor lighting. Clear any walking paths of anything that might make someone trip, such as rocks or tools. Regularly check to see if handrails are loose or broken. Make sure that both sides of any steps have handrails. Any raised decks and porches should have guardrails on the edges. Have any leaves, snow, or ice cleared regularly. Use sand or salt on walking paths during winter. Clean up any spills in your garage right away. This includes oil or grease spills. What can I do in the bathroom? Use night lights. Install grab bars by the toilet and in the tub and shower. Do not use towel bars as grab bars. Use non-skid mats or decals in the tub or shower. If you need to sit down in the shower, use a plastic, non-slip stool. Keep the floor dry. Clean up any water that spills on the floor as soon as it happens. Remove soap buildup in the tub or shower regularly. Attach bath mats securely with double-sided non-slip rug tape. Do not have throw rugs and other things on the floor that can make you trip. What can I do in the bedroom? Use night lights. Make sure that you have a  light by your bed that is easy to reach. Do not use any sheets or blankets that are too big for your bed. They should not hang down onto the floor. Have a firm chair that has side arms. You can use this for support while you get dressed. Do not have throw rugs and other things on the floor that can make you trip. What can I do in the kitchen? Clean up any spills right away. Avoid walking on wet floors. Keep items that you use a lot in easy-to-reach places. If you need to reach something above you, use a strong step stool that has a grab bar. Keep electrical cords out of the way. Do not use floor polish or wax that makes floors slippery. If you must use wax, use non-skid floor wax. Do not have throw rugs and other things on the floor that can make you trip. What can I do with my stairs? Do not leave any items on the stairs. Make sure that there are handrails on both sides of the stairs and use them. Fix handrails that are broken or loose. Make sure that handrails are as long as the stairways. Check any carpeting to make sure that it is firmly attached to the stairs. Fix any carpet that is loose or worn.  Avoid having throw rugs at the top or bottom of the stairs. If you do have throw rugs, attach them to the floor with carpet tape. Make sure that you have a light switch at the top of the stairs and the bottom of the stairs. If you do not have them, ask someone to add them for you. What else can I do to help prevent falls? Wear shoes that: Do not have high heels. Have rubber bottoms. Are comfortable and fit you well. Are closed at the toe. Do not wear sandals. If you use a stepladder: Make sure that it is fully opened. Do not climb a closed stepladder. Make sure that both sides of the stepladder are locked into place. Ask someone to hold it for you, if possible. Clearly mark and make sure that you can see: Any grab bars or handrails. First and last steps. Where the edge of each step  is. Use tools that help you move around (mobility aids) if they are needed. These include: Canes. Walkers. Scooters. Crutches. Turn on the lights when you go into a dark area. Replace any light bulbs as soon as they burn out. Set up your furniture so you have a clear path. Avoid moving your furniture around. If any of your floors are uneven, fix them. If there are any pets around you, be aware of where they are. Review your medicines with your doctor. Some medicines can make you feel dizzy. This can increase your chance of falling. Ask your doctor what other things that you can do to help prevent falls. This information is not intended to replace advice given to you by your health care provider. Make sure you discuss any questions you have with your health care provider. Document Released: 06/17/2009 Document Revised: 01/27/2016 Document Reviewed: 09/25/2014 Elsevier Interactive Patient Education  2017 [employer].

## 2022-12-06 ENCOUNTER — Ambulatory Visit: Payer: Medicare PPO | Admitting: Family Medicine

## 2022-12-06 ENCOUNTER — Encounter: Payer: Self-pay | Admitting: Family Medicine

## 2022-12-06 VITALS — BP 124/87 | HR 74 | Temp 95.9°F | Resp 18 | Ht <= 58 in | Wt 96.0 lb

## 2022-12-06 DIAGNOSIS — I10 Essential (primary) hypertension: Secondary | ICD-10-CM

## 2022-12-06 DIAGNOSIS — R609 Edema, unspecified: Secondary | ICD-10-CM

## 2022-12-06 DIAGNOSIS — M059 Rheumatoid arthritis with rheumatoid factor, unspecified: Secondary | ICD-10-CM | POA: Diagnosis not present

## 2022-12-06 DIAGNOSIS — D649 Anemia, unspecified: Secondary | ICD-10-CM | POA: Diagnosis not present

## 2022-12-06 DIAGNOSIS — H6123 Impacted cerumen, bilateral: Secondary | ICD-10-CM | POA: Diagnosis not present

## 2022-12-06 DIAGNOSIS — M797 Fibromyalgia: Secondary | ICD-10-CM

## 2022-12-06 DIAGNOSIS — D509 Iron deficiency anemia, unspecified: Secondary | ICD-10-CM | POA: Diagnosis not present

## 2022-12-06 DIAGNOSIS — M810 Age-related osteoporosis without current pathological fracture: Secondary | ICD-10-CM | POA: Diagnosis not present

## 2022-12-06 MED ORDER — AMITRIPTYLINE HCL 10 MG PO TABS: ORAL_TABLET | ORAL | 0 refills | Status: DC

## 2022-12-06 MED ORDER — FUROSEMIDE 20 MG PO TABS: 20.0000 mg | ORAL_TABLET | Freq: Every day | ORAL | 3 refills | Status: DC

## 2022-12-06 MED ORDER — NAPROXEN 500 MG PO TABS: 500.0000 mg | ORAL_TABLET | Freq: Two times a day (BID) | ORAL | 2 refills | Status: DC

## 2022-12-06 NOTE — Progress Notes (Signed)
Provider:  Alain Honey, MD  Careteam: Patient Care Team: Wardell Honour, MD as PCP - General (Family Medicine) Jerline Pain, MD as PCP - Cardiology (Cardiology) Hennie Duos, MD as Consulting Physician (Rheumatology) Altheimer, Legrand Como, MD as Consulting Physician (Endocrinology) Newman Pies, MD as Consulting Physician (Neurosurgery) Clydell Hakim, MD (Inactive) as Consulting Physician (Anesthesiology) Jola Baptist, Madie Reno, MD (Inactive) as Consulting Physician (Diagnostic Radiology) Jolene Schimke, MD as Referring Physician (Dermatology) Lynnell Dike, OD as Consulting Physician (Optometry) Marcene Corning, MD as Referring Physician (Internal Medicine)  PLACE OF SERVICE:  [place] Directive information    Allergies  Allergen Reactions   Arava [Leflunomide] Diarrhea, Nausea Only and Other (See Comments)    Loss appetite   Penicillins Rash and Other (See Comments)    PATIENT HAS HAD A PCN REACTION WITH IMMEDIATE RASH, FACIAL/TONGUE/THROAT SWELLING, SOB, OR LIGHTHEADEDNESS WITH HYPOTENSION:  #  #  YES  #  #  Has patient had a PCN reaction causing severe rash involving mucus membranes or skin necrosis: no Has patient had a PCN reaction that required hospitalization: no    Methotrexate Other (See Comments)   Penicillamine Other (See Comments)   Methotrexate Derivatives Other (See Comments)    Hair loss   Neurontin [Gabapentin] Nausea And Vomiting    Chief Complaint  Patient presents with   Follow-up    Four Month Follow-up, patient is requesting labs to see if she diabetic.     HPI: Patient is a 77 y.o. female .  Patient is again worried about diabetes.  She struck her leg on an object back in December and it has not yet healed.  She is being followed by dermatologist and as per for wound care.  Other risk for possible diabetes as long-term use of prednisone. Also requesting to check her ears as seems like her hearing is declined.  Review  of Systems:  Review of Systems  Constitutional:  Positive for malaise/fatigue.  HENT:  Positive for hearing loss.   Eyes: Negative.   Respiratory: Negative.    Cardiovascular:  Positive for leg swelling.  Genitourinary:  Positive for frequency.  Musculoskeletal:  Positive for joint pain.  Psychiatric/Behavioral:  The patient is nervous/anxious.   All other systems reviewed and are negative.   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 Lumbo-sacral anterolisthesis     Senile osteoporosis 04/01/03   Sprain of metatarsophalangeal (joint) of foot    Thyrotoxicosis    Thyrotoxicosis without mention of goiter or other cause, without mention of thyrotoxic crisis or storm    Unspecified vitamin D deficiency  Urinary frequency    Past Surgical History:  Procedure Laterality Date   COLONOSCOPY     COLONOSCOPY N/A 04/15/2015   Procedure: COLONOSCOPY;  Surgeon: Gatha Mayer, MD;  Location: [facility];  Service: Endoscopy;  Laterality: N/A;   DG FINGERS, MULTIPLE LT HAND ([facility] HX)     EYE SURGERY     FOOT SURGERY Bilateral 05/22/2014   Dr.Strom    LUMBAR DISC SURGERY   12/12/2017   Dr Arnoldo Morale   REMOVE GANGLION  1998   DR [facility]    RIGHT WRIST SUGERY FOR TENOSYNOVITIS  1997   DR [facility]   TUBAL LIGATION  1973   Social History:   reports that she has never smoked. She has never used smokeless tobacco. She reports that she does not drink alcohol and does not use drugs.  Family History  Problem Relation Age of Onset   Heart attack Father        age 25   Heart disease Father    Colon cancer Neg Hx    Esophageal cancer Neg Hx    Rectal cancer Neg Hx    Stomach cancer Neg Hx     Medications: Patient's Medications  New Prescriptions   No medications on file  Previous Medications   ACETAMINOPHEN (TYLENOL) 650 MG CR TABLET    Take 1,300 mg by mouth at bedtime.   AMITRIPTYLINE (ELAVIL) 10 MG TABLET    TAKE 1 TABLET BY MOUTH ONCE DAILY AT BEDTIME FOR MUSCLE RELAXATION   BIOTIN 1000 MCG TABLET    Take 1 tablet (1 mg total) by mouth 2 (two) times daily.   BUDESONIDE (ENTOCORT EC) 3 MG 24 HR CAPSULE    Take 3 capsules (9 mg total) by mouth daily.   CALCIUM CARB-CHOLECALCIFEROL (CALTRATE 600+D3) 600-800 MG-UNIT TABS    Take 1 tablet by mouth 2 (two) times daily.   CHOLECALCIFEROL (VITAMIN D3) 50 MCG (2000 UT) CAPSULE    Take 1 capsule (2,000 Units total) by mouth daily.   CITALOPRAM (CELEXA) 10 MG TABLET    Take 1 tablet (10 mg total) by mouth daily.   CYCLOSPORINE (RESTASIS) 0.05 % OPHTHALMIC EMULSION    Place 1 drop into both eyes 2 (two) times daily.   DENOSUMAB (PROLIA) 60 MG/ML SOSY INJECTION    Inject 60 mg into the skin every 6 (six) months.   DIPHENOXYLATE-ATROPINE (LOMOTIL) 2.5-0.025 MG TABLET    TAKE 1 TO 2 TABLETS BY MOUTH EVERY 8 HOURS AS NEEDED   ETANERCEPT (ENBREL) 50 MG/ML INJECTION    Inject 50 mg into the skin once a week.   FLUTICASONE (FLONASE) 50 MCG/ACT NASAL SPRAY    Place 1 spray into both nostrils daily for 14 days.   FOLIC ACID (FOLVITE) 1 MG TABLET    Take 1 mg by mouth daily.   FUROSEMIDE  (LASIX) 20 MG TABLET    Take 1 tablet (20 mg total) by mouth daily.   GLUCOSAMINE-CHONDROIT-VIT C-MN (GLUCOSAMINE CHONDR 500 COMPLEX PO)    Take 2 tablets by mouth daily.    HYDROXYCHLOROQUINE (PLAQUENIL) 200 MG TABLET       LOSARTAN (COZAAR) 100 MG TABLET    Take 1 tablet by mouth once daily   MENTHOL, TOPICAL ANALGESIC, (ASPERCREME MAX ROLL-ON EX)    Apply topically.   MINOCYCLINE (MINOCIN) 100 MG CAPSULE    Take 100 mg by mouth 2 (two) times daily.   MULTIPLE VITAMIN (MULTIVITAMIN) TABLET  Take 1 tablet by mouth daily.    NAPROXEN (NAPROSYN) 500 MG TABLET    Take 1 tablet (500 mg total) by mouth in the morning and at bedtime.   OMEGA-3 FATTY ACIDS (FISH OIL) 1000 MG CAPS    Take 1,000 mg by mouth daily.    OMEPRAZOLE (PRILOSEC) 40 MG CAPSULE    Take 1 capsule by mouth once daily   PREDNISONE (DELTASONE) 5 MG TABLET    TAKE 1 TABLET BY MOUTH WITH BREAKFAST   PROPYLENE GLYCOL 0.6 % SOLN    Place 1 drop into both eyes 2 (two) times daily as needed (dry eyes).    TIROSINT 50 MCG CAPS    Take 1 tablet by mouth 3 (three) times a week.   TRAMADOL (ULTRAM) 50 MG TABLET    Take 1 tablet (50 mg total) by mouth 2 (two) times daily as needed.   TRETINOIN (RETIN-A) 0.025 % CREAM    tretinoin 0.025 % topical cream  APPLY TO FACE AT BEDTIME   UREA (CARMOL) 10 % CREAM    Apply topically as needed.   ZINC GLUCONATE 50 MG TABLET    Take by mouth as needed.  Modified Medications   No medications on file  Discontinued Medications   No medications on file    Physical Exam:  There were no vitals filed for this visit. There is no height or weight on file to calculate BMI. Wt Readings from Last 3 Encounters:  11/28/22 97 lb 3.2 oz (44.1 kg)  10/12/22 98 lb 11.2 oz (44.8 kg)  09/08/22 99 lb (44.9 kg)    Physical Exam Vitals and nursing note reviewed.  Constitutional:      Appearance: Normal appearance.  HENT:     Ears:     Comments: Both TMs occluded by cerumen Cardiovascular:     Rate and  Rhythm: Normal rate and regular rhythm.     Heart sounds: Murmur heard.  Pulmonary:     Effort: Pulmonary effort is normal.     Breath sounds: Normal breath sounds.  Abdominal:     General: Bowel sounds are normal.     Palpations: Abdomen is soft.  Musculoskeletal:        General: Normal range of motion.     Comments: Arthritic deformities in hands and fingers  Neurological:     General: No focal deficit present.     Mental Status: She is alert and oriented to person, place, and time.  Psychiatric:        Mood and Affect: Mood normal.        Behavior: Behavior normal.    Labs reviewed: Basic Metabolic Panel: Recent Labs    04/11/22 1309 05/02/22 1021 08/30/22 0920 09/14/22 0911  NA 138 141 140  --   K 5.1 4.6 4.4  --   CL 105 105 109  --   CO2 28 27 24   --   GLUCOSE 97 77 77  --   BUN 39* 30* 25  --   CREATININE 1.10* 1.12* 0.92  --   CALCIUM 8.5* 9.2 7.8* 8.0*   Liver Function Tests: Recent Labs    04/11/22 1309  AST 14*  ALT 11  ALKPHOS 47  BILITOT 0.3  PROT 6.6  ALBUMIN 3.7   No results for input(s): "LIPASE", "AMYLASE" in the last 8760 hours. No results for input(s): "AMMONIA" in the last 8760 hours. CBC: Recent Labs    04/21/22 0745 07/13/22 1020 10/12/22 1033  WBC 11.8* 13.8*  10.6*  NEUTROABS 6.7 10.9* 8.2*  HGB 11.1* 11.7* 11.7*  HCT 34.1* 35.3* 35.7*  MCV 85.5 94.4 97.0  PLT 303 309 306   Lipid Panel: No results for input(s): "CHOL", "HDL", "LDLCALC", "TRIG", "CHOLHDL", "LDLDIRECT" in the last 8760 hours. TSH: No results for input(s): "TSH" in the last 8760 hours. A1C: Lab Results  Component Value Date   HGBA1C 5.2 11/17/2019     Assessment/Plan  1. Edema, unspecified type Followed by cardiology takes furosemide as needed for dependent edema  2. Rheumatoid arthritis with positive rheumatoid factor, involving unspecified site Followed by rheumatology.  Takes low-dose prednisone daily  3. Primary hypertension Blood pressure  controlled on losartan 100 mg/day  4. Bilateral impacted cerumen Will attempt to irrigate ears results are pending  5. Fibromyalgia Sees rheumatology she is on prednisone as well as citalopram  6. Iron deficiency anemia, unspecified iron deficiency anemia type Followed by oncology.  Last hemoglobin was 11.7 with normal ferritin  7. Senile osteoporosis Getting Prolia shots.  Also supplements diet with calcium and vitamin D   Alain Honey, MD [facility] [phone]

## 2022-12-07 DIAGNOSIS — L97911 Non-pressure chronic ulcer of unspecified part of right lower leg limited to breakdown of skin: Secondary | ICD-10-CM | POA: Diagnosis not present

## 2022-12-07 LAB — BASIC METABOLIC PANEL WITH GFR
BUN/Creatinine Ratio: 28 (calc) — ABNORMAL HIGH (ref 6–22)
BUN: 31 mg/dL — ABNORMAL HIGH (ref 7–25)
CO2: 28 mmol/L (ref 20–32)
Calcium: 8.8 mg/dL (ref 8.6–10.4)
Chloride: 104 mmol/L (ref 98–110)
Creat: 1.1 mg/dL — ABNORMAL HIGH (ref 0.60–1.00)
Glucose, Bld: 79 mg/dL (ref 65–99)
Potassium: 4.3 mmol/L (ref 3.5–5.3)
Sodium: 141 mmol/L (ref 135–146)
eGFR: 52 mL/min/{1.73_m2} — ABNORMAL LOW (ref >=60)

## 2022-12-09 ENCOUNTER — Other Ambulatory Visit: Payer: Self-pay | Admitting: Family Medicine

## 2022-12-09 DIAGNOSIS — I10 Essential (primary) hypertension: Secondary | ICD-10-CM

## 2022-12-14 ENCOUNTER — Telehealth: Payer: Self-pay | Admitting: Internal Medicine

## 2022-12-14 ENCOUNTER — Other Ambulatory Visit: Payer: Self-pay

## 2022-12-14 DIAGNOSIS — L97911 Non-pressure chronic ulcer of unspecified part of right lower leg limited to breakdown of skin: Secondary | ICD-10-CM | POA: Diagnosis not present

## 2022-12-14 DIAGNOSIS — K52831 Collagenous colitis: Secondary | ICD-10-CM

## 2022-12-14 DIAGNOSIS — B351 Tinea unguium: Secondary | ICD-10-CM | POA: Diagnosis not present

## 2022-12-14 MED ORDER — BUDESONIDE 3 MG PO CPEP
9.0000 mg | ORAL_CAPSULE | Freq: Every day | ORAL | 3 refills | Status: DC
Start: 2022-12-14 — End: 2023-05-30

## 2022-12-14 MED ORDER — DIPHENOXYLATE-ATROPINE 2.5-0.025 MG PO TABS: 1.0000 | ORAL_TABLET | Freq: Three times a day (TID) | ORAL | 3 refills | Status: DC | PRN

## 2022-12-14 NOTE — Telephone Encounter (Signed)
Spoke with pt and she is aware of Dr. Lamar Sprinkles recommendations. New script sent in for budesonide, refill called in for lomotil. Pt knows to call back with an update in 1 mth.

## 2022-12-14 NOTE — Telephone Encounter (Signed)
Patient is calling wishing to speak with a nurse states she is having bad diarrhea and is not sure if she should alter her medication again. Please advise

## 2022-12-14 NOTE — Telephone Encounter (Signed)
1.  Increase budesonide 9 mg daily 2.  Okay to use Lomotil more frequently, as directed. 3.  Have her call back in 1 month with an update.  Sooner if needed Thanks

## 2022-12-14 NOTE — Telephone Encounter (Signed)
Pt was told to increase her budesonide to 6mg  daily on 10/26/22 and call with an update in 1 mth. Pt calling today states she is still having diarrhea. She states she has diarrhea every morning and then 2-3 times/day depending on what she eats. She wanted to know if Dr. [name] thinks she should increase to 9mg  daily or if she should take more lomotil. Please advise.

## 2022-12-21 DIAGNOSIS — L97911 Non-pressure chronic ulcer of unspecified part of right lower leg limited to breakdown of skin: Secondary | ICD-10-CM | POA: Diagnosis not present

## 2023-01-09 ENCOUNTER — Inpatient Hospital Stay: Payer: Medicare PPO | Admitting: Internal Medicine

## 2023-01-09 ENCOUNTER — Other Ambulatory Visit: Payer: Self-pay

## 2023-01-09 ENCOUNTER — Inpatient Hospital Stay: Payer: Medicare PPO | Attending: Physician Assistant

## 2023-01-09 VITALS — BP 156/77 | HR 76 | Temp 98.0°F | Resp 15 | Wt 94.3 lb

## 2023-01-09 DIAGNOSIS — D508 Other iron deficiency anemias: Secondary | ICD-10-CM

## 2023-01-09 DIAGNOSIS — Z79899 Other long term (current) drug therapy: Secondary | ICD-10-CM | POA: Diagnosis not present

## 2023-01-09 DIAGNOSIS — D500 Iron deficiency anemia secondary to blood loss (chronic): Secondary | ICD-10-CM | POA: Insufficient documentation

## 2023-01-09 DIAGNOSIS — K922 Gastrointestinal hemorrhage, unspecified: Secondary | ICD-10-CM | POA: Insufficient documentation

## 2023-01-09 LAB — CBC WITH DIFFERENTIAL (CANCER CENTER ONLY)
Abs Immature Granulocytes: 0.1 10*3/uL — ABNORMAL HIGH (ref 0.00–0.07)
Basophils Absolute: 0.1 10*3/uL (ref 0.0–0.1)
Basophils Relative: 1 %
Eosinophils Absolute: 0.1 10*3/uL (ref 0.0–0.5)
Eosinophils Relative: 1 %
HCT: 39 % (ref 36.0–46.0)
Hemoglobin: 12.8 g/dL (ref 12.0–15.0)
Immature Granulocytes: 1 %
Lymphocytes Relative: 16 %
Lymphs Abs: 2 10*3/uL (ref 0.7–4.0)
MCH: 31.8 pg (ref 26.0–34.0)
MCHC: 32.8 g/dL (ref 30.0–36.0)
MCV: 97 fL (ref 80.0–100.0)
Monocytes Absolute: 1.1 10*3/uL — ABNORMAL HIGH (ref 0.1–1.0)
Monocytes Relative: 8 %
Neutro Abs: 9.4 10*3/uL — ABNORMAL HIGH (ref 1.7–7.7)
Neutrophils Relative %: 73 %
Platelet Count: 296 10*3/uL (ref 150–400)
RBC: 4.02 MIL/uL (ref 3.87–5.11)
RDW: 14.1 % (ref 11.5–15.5)
WBC Count: 12.7 10*3/uL — ABNORMAL HIGH (ref 4.0–10.5)
nRBC: 0 % (ref 0.0–0.2)

## 2023-01-09 LAB — IRON AND IRON BINDING CAPACITY (CC-WL,HP ONLY)
Iron: 92 ug/dL (ref 28–170)
Saturation Ratios: 31 % (ref 10.4–31.8)
TIBC: 300 ug/dL (ref 250–450)
UIBC: 208 ug/dL

## 2023-01-09 LAB — FERRITIN: Ferritin: 72 ng/mL (ref 11–307)

## 2023-01-09 NOTE — Progress Notes (Signed)
[facility] [phone]   [phone]  OFFICE PROGRESS NOTE  Frederica Kuster, MD [address] [place] [address]  DIAGNOSIS: Iron deficiency anemia secondary to gastrointestinal blood loss.  PRIOR THERAPY: Iron infusion with Venofer 300 Mg IV weekly for 3 weeks.  CURRENT THERAPY: Iron rich diet  INTERVAL HISTORY: Joan [name] 77 y.o. female returns to the clinic today for follow-up visit accompanied by her husband.  The patient is feeling fine today with no concerning complaints except for mild fatigue.  She denied having any chest pain, shortness of breath except with exertion with no cough or hemoptysis.  She is eating more iron rich diet with level.  She has no nausea, vomiting, diarrhea or constipation.  She has no headache or visual changes.  She is here today for evaluation and repeat blood work.  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 Lumbo-sacral anterolisthesis     Senile osteoporosis 04/01/03   Sprain of metatarsophalangeal (joint) of foot    Thyrotoxicosis    Thyrotoxicosis without mention of  goiter or other cause, without mention of thyrotoxic crisis or storm    Unspecified vitamin D deficiency    Urinary frequency     ALLERGIES:  is allergic to arava [leflunomide], penicillins, methotrexate, penicillamine, methotrexate derivatives, and neurontin [gabapentin].  MEDICATIONS:  Current Outpatient Medications  Medication Sig Dispense Refill   acetaminophen (TYLENOL) 650 MG CR tablet Take 1,300 mg by mouth at bedtime.     amitriptyline (ELAVIL) 10 MG tablet TAKE 1 TABLET BY MOUTH ONCE DAILY AT BEDTIME FOR MUSCLE RELAXATION 90 tablet 0   Biotin 1000 MCG tablet Take 1 tablet (1 mg total) by mouth 2 (two) times daily. 60 tablet 3   budesonide (ENTOCORT EC) 3 MG 24 hr capsule Take 3 capsules (9 mg total) by mouth daily. 270 capsule 3   Calcium Carb-Cholecalciferol (CALTRATE 600+D3) 600-800 MG-UNIT TABS Take 1 tablet by mouth 2 (two) times daily. 60 tablet 11   Cholecalciferol (VITAMIN D3) 50 MCG (2000 UT) capsule Take 1 capsule (2,000 Units total) by mouth daily. 30 capsule 3   citalopram (CELEXA) 10 MG tablet Take 1 tablet (10 mg total) by mouth daily. 30 tablet 3   cycloSPORINE (RESTASIS) 0.05 % ophthalmic emulsion Place 1 drop into both eyes 2 (two) times daily.     denosumab (PROLIA) 60 MG/ML SOSY injection Inject 60 mg into the skin every 6 (six) months. 1 mL 1   diphenoxylate-atropine (LOMOTIL) 2.5-0.025 MG tablet Take 1-2 tablets by mouth every 8 (eight) hours as needed. 60 tablet 3  etanercept (ENBREL) 50 MG/ML injection Inject 50 mg into the skin once a week.     fluticasone (FLONASE) 50 MCG/ACT nasal spray Place 1 spray into both nostrils daily for 14 days. 1 g 0   folic acid (FOLVITE) 1 MG tablet Take 1 mg by mouth daily.     furosemide (LASIX) 20 MG tablet Take 1 tablet (20 mg total) by mouth daily. 90 tablet 3   Glucosamine-Chondroit-Vit C-Mn (GLUCOSAMINE CHONDR 500 COMPLEX PO) Take 2 tablets by mouth daily.      hydroxychloroquine (PLAQUENIL) 200 MG tablet      losartan  (COZAAR) 100 MG tablet Take 1 tablet by mouth once daily 90 tablet 0   Menthol, Topical Analgesic, (ASPERCREME MAX ROLL-ON EX) Apply topically.     minocycline (MINOCIN) 100 MG capsule Take 100 mg by mouth 2 (two) times daily.     Multiple Vitamin (MULTIVITAMIN) tablet Take 1 tablet by mouth daily.      naproxen (NAPROSYN) 500 MG tablet Take 1 tablet (500 mg total) by mouth in the morning and at bedtime. 180 tablet 2   Omega-3 Fatty Acids (FISH OIL) 1000 MG CAPS Take 1,000 mg by mouth daily.      omeprazole (PRILOSEC) 40 MG capsule Take 1 capsule by mouth once daily 90 capsule 6   predniSONE (DELTASONE) 5 MG tablet TAKE 1 TABLET BY MOUTH WITH BREAKFAST 90 tablet 0   Propylene Glycol 0.6 % SOLN Place 1 drop into both eyes 2 (two) times daily as needed (dry eyes).      TIROSINT 50 MCG CAPS Take 1 tablet by mouth 3 (three) times a week.     traMADol (ULTRAM) 50 MG tablet Take 1 tablet (50 mg total) by mouth 2 (two) times daily as needed. 60 tablet 5   tretinoin (RETIN-A) 0.025 % cream tretinoin 0.025 % topical cream  APPLY TO FACE AT BEDTIME     urea (CARMOL) 10 % cream Apply topically as needed.     zinc gluconate 50 MG tablet Take by mouth as needed.     No current facility-administered medications for this visit.    SURGICAL HISTORY:  Past Surgical History:  Procedure Laterality Date   COLONOSCOPY     COLONOSCOPY N/A 04/15/2015   Procedure: COLONOSCOPY;  Surgeon: Iva Boop, MD;  Location: [facility] ENDOSCOPY;  Service: Endoscopy;  Laterality: N/A;   DG FINGERS, MULTIPLE LT HAND ([facility] HX)     EYE SURGERY     FOOT SURGERY Bilateral 05/22/2014   Dr.Strom    LUMBAR DISC SURGERY  12/12/2017   Dr Lovell Sheehan   REMOVE GANGLION  1998   DR [facility]    RIGHT WRIST SUGERY FOR TENOSYNOVITIS  1997   DR SYPHER    TONSILLECTOMY AND ADENOIDECTOMY  1974   TUBAL LIGATION  1973    REVIEW OF SYSTEMS:  A comprehensive review of systems was negative.   PHYSICAL EXAMINATION: General appearance: alert,  cooperative, and no distress Head: Normocephalic, without obvious abnormality, atraumatic Neck: no adenopathy, no JVD, supple, symmetrical, trachea midline, and thyroid not enlarged, symmetric, no tenderness/mass/nodules Lymph nodes: Cervical, supraclavicular, and axillary nodes normal. Resp: clear to auscultation bilaterally Back: symmetric, no curvature. ROM normal. No CVA tenderness. Cardio: regular rate and rhythm, S1, S2 normal, no murmur, click, rub or gallop GI: soft, non-tender; bowel sounds normal; no masses,  no organomegaly Extremities: extremities normal, atraumatic, no cyanosis or edema  ECOG PERFORMANCE STATUS: 1 - Symptomatic but completely ambulatory  Blood pressure (!) 156/77,  pulse 76, temperature 98 F (36.7 C), temperature source Oral, resp. rate 15, weight 94 lb 4.8 oz (42.8 kg), SpO2 100 %.  LABORATORY DATA: Lab Results  Component Value Date   WBC 12.7 (H) 01/09/2023   HGB 12.8 01/09/2023   HCT 39.0 01/09/2023   MCV 97.0 01/09/2023   PLT 296 01/09/2023      Chemistry      Component Value Date/Time   NA 141 12/06/2022 1003   NA 143 02/02/2016 0840   K 4.3 12/06/2022 1003   CL 104 12/06/2022 1003   CO2 28 12/06/2022 1003   BUN 31 (H) 12/06/2022 1003   BUN 13 02/02/2016 0840   CREATININE 1.10 (H) 12/06/2022 1003      Component Value Date/Time   CALCIUM 8.8 12/06/2022 1003   ALKPHOS 47 04/11/2022 1309   AST 14 (L) 04/11/2022 1309   ALT 11 04/11/2022 1309   BILITOT 0.3 04/11/2022 1309       RADIOGRAPHIC STUDIES: No results found.  ASSESSMENT AND PLAN: This is a very pleasant 77 years old white female with history of iron deficiency anemia secondary to gastrointestinal blood loss.  The patient has intolerance to the oral iron tablets. She was treated with iron infusion with Venofer 300 mg IV weekly for 3 weeks and tolerated it well. She has been increasing the iron rich diet recently. Repeat CBC showed improvement of her hemoglobin up to 12.8 and  hematocrit 39.0%. I recommended for the patient to continue with the iron rich diet for now.  I will see her back for follow-up visit in 6 months for evaluation and repeat blood work. She was advised to call immediately if she has any other concerning symptoms in the interval. The patient voices understanding of current disease status and treatment options and is in agreement with the current care plan. All questions were answered. The patient knows to call the clinic with any problems, questions or concerns. We can certainly see the patient much sooner if necessary.  The total time spent in the appointment was 20 minutes.  Disclaimer: This note was dictated with voice recognition software. Similar sounding words can inadvertently be transcribed and may not be corrected upon review.

## 2023-01-16 NOTE — Telephone Encounter (Signed)
Spoke with Joan [name] and she states she is taking budesonide 9mg  in the am, lomotil 2 in the am and 2 at night. Reports she is still having 3 diarrhea stools in the am and she wanted to know if there was anything else she could take of does she just need to get a refill on he the lomotil. Please advise.

## 2023-01-16 NOTE — Telephone Encounter (Signed)
Inbound call from patient states she is still experiencing diarrhea and would like to speak with  nurse to further advise if she needs to be prescribed something else. Please advise.  Thank you

## 2023-01-16 NOTE — Telephone Encounter (Signed)
1.  Okay to refill any medications that need refilled 2.  She can try bulking agent such as Citrucel 2 tablespoons daily

## 2023-01-17 NOTE — Telephone Encounter (Signed)
Left detailed message regarding Dr. Lamar Sprinkles recommendations on her answering machine per her request as she was not going to be home today.

## 2023-01-23 DIAGNOSIS — L820 Inflamed seborrheic keratosis: Secondary | ICD-10-CM | POA: Diagnosis not present

## 2023-01-23 DIAGNOSIS — T148XXA Other injury of unspecified body region, initial encounter: Secondary | ICD-10-CM | POA: Diagnosis not present

## 2023-01-23 DIAGNOSIS — D485 Neoplasm of uncertain behavior of skin: Secondary | ICD-10-CM | POA: Diagnosis not present

## 2023-01-23 DIAGNOSIS — L570 Actinic keratosis: Secondary | ICD-10-CM | POA: Diagnosis not present

## 2023-01-30 DIAGNOSIS — M21241 Flexion deformity, right finger joints: Secondary | ICD-10-CM | POA: Diagnosis not present

## 2023-01-30 DIAGNOSIS — M810 Age-related osteoporosis without current pathological fracture: Secondary | ICD-10-CM | POA: Diagnosis not present

## 2023-01-30 DIAGNOSIS — N1831 Chronic kidney disease, stage 3a: Secondary | ICD-10-CM | POA: Diagnosis not present

## 2023-01-30 DIAGNOSIS — M5136 Other intervertebral disc degeneration, lumbar region: Secondary | ICD-10-CM | POA: Diagnosis not present

## 2023-01-30 DIAGNOSIS — M7022 Olecranon bursitis, left elbow: Secondary | ICD-10-CM | POA: Diagnosis not present

## 2023-01-30 DIAGNOSIS — R5383 Other fatigue: Secondary | ICD-10-CM | POA: Diagnosis not present

## 2023-01-30 DIAGNOSIS — M1991 Primary osteoarthritis, unspecified site: Secondary | ICD-10-CM | POA: Diagnosis not present

## 2023-01-30 DIAGNOSIS — M0579 Rheumatoid arthritis with rheumatoid factor of multiple sites without organ or systems involvement: Secondary | ICD-10-CM | POA: Diagnosis not present

## 2023-01-30 DIAGNOSIS — D508 Other iron deficiency anemias: Secondary | ICD-10-CM | POA: Diagnosis not present

## 2023-02-06 ENCOUNTER — Ambulatory Visit: Payer: Medicare PPO | Attending: Cardiology | Admitting: Cardiology

## 2023-02-06 ENCOUNTER — Encounter: Payer: Self-pay | Admitting: Cardiology

## 2023-02-06 VITALS — BP 120/60 | HR 78 | Ht <= 58 in | Wt 93.6 lb

## 2023-02-06 DIAGNOSIS — I340 Nonrheumatic mitral (valve) insufficiency: Secondary | ICD-10-CM

## 2023-02-06 DIAGNOSIS — I10 Essential (primary) hypertension: Secondary | ICD-10-CM

## 2023-02-06 DIAGNOSIS — M069 Rheumatoid arthritis, unspecified: Secondary | ICD-10-CM

## 2023-02-06 MED ORDER — LOSARTAN POTASSIUM 50 MG PO TABS: 50.0000 mg | ORAL_TABLET | Freq: Every day | ORAL | 3 refills | Status: DC

## 2023-02-06 NOTE — Progress Notes (Signed)
Cardiology Office Note:    Date:  02/06/2023   ID:  Joan [name], DOB [birth year], MRN [id number]  PCP:  Frederica Kuster, MD   [facility] Providers Cardiologist:  Donato Schultz, MD     Referring MD: Frederica Kuster, MD    History of Present Illness:    Joan [name] is a 77 y.o. female here for follow-up hypertension mitral valve regurgitation rheumatoid arthritis family history of CAD with her father.  She has been feeling some dizziness, washed out feeling.  She worries that she is taking too much losartan 100 mg.  Her father died of a heart attack and she is very concerned that she needs to be seen. She has had dependent edema.  Has seen nephrologist who feels like venous stasis is likely the issue.  Support stockings elevation recommended.  She takes naproxen prednisone causing fluid retention as well.  She was also given Lasix 20 mg with no refills.  Her last creatinine in October was 1.0.  She was told at one point that she had failing kidneys, this seems to have resolved.  Non-smoker.  Her father had myocardial infarction and died at age 74.   On Nov 16, 2017 nuclear stress test was low risk with normal perfusion.  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 Lumbo-sacral anterolisthesis     Senile osteoporosis 04/01/03   Sprain of metatarsophalangeal (joint) of foot    Thyrotoxicosis    Thyrotoxicosis without mention of goiter or other cause, without mention of thyrotoxic crisis or storm    Unspecified vitamin D deficiency    Urinary frequency     Past Surgical History:  Procedure Laterality Date   COLONOSCOPY     COLONOSCOPY N/A 04/15/2015   Procedure: COLONOSCOPY;  Surgeon: Iva Boop, MD;  Location: [facility] ENDOSCOPY;  Service: Endoscopy;  Laterality: N/A;   DG FINGERS, MULTIPLE LT HAND ([facility] HX)     EYE SURGERY     FOOT SURGERY Bilateral 05/22/2014   Dr.Strom    LUMBAR DISC SURGERY  12/12/2017   Dr Lovell Sheehan   REMOVE GANGLION  1998   DR [facility]    RIGHT WRIST SUGERY FOR TENOSYNOVITIS  1997   DR SYPHER    TONSILLECTOMY AND ADENOIDECTOMY  1974   TUBAL LIGATION  1973    Current Medications: Current Meds  Medication Sig   acetaminophen (TYLENOL) 650 MG CR tablet Take 1,300 mg by mouth at bedtime.   amitriptyline (ELAVIL) 10 MG tablet TAKE 1 TABLET BY MOUTH ONCE DAILY AT BEDTIME FOR MUSCLE RELAXATION   Biotin  1000 MCG tablet Take 1 tablet (1 mg total) by mouth 2 (two) times daily.   budesonide (ENTOCORT EC) 3 MG 24 hr capsule Take 3 capsules (9 mg total) by mouth daily.   Calcium Carb-Cholecalciferol (CALTRATE 600+D3) 600-800 MG-UNIT TABS Take 1 tablet by mouth 2 (two) times daily.   Cholecalciferol (VITAMIN D3) 50 MCG (2000 UT) capsule Take 1 capsule (2,000 Units total) by mouth daily.   citalopram (CELEXA) 10 MG tablet Take 1 tablet (10 mg total) by mouth daily.   cycloSPORINE (RESTASIS) 0.05 % ophthalmic emulsion Place 1 drop into both eyes 2 (two) times daily.   denosumab (PROLIA) 60 MG/ML SOSY injection Inject 60 mg into the skin every 6 (six) months.   diphenoxylate-atropine (LOMOTIL)  2.5-0.025 MG tablet Take 1-2 tablets by mouth every 8 (eight) hours as needed.   etanercept (ENBREL) 50 MG/ML injection Inject 50 mg into the skin once a week.   fluticasone (FLONASE) 50 MCG/ACT nasal spray Place 1 spray into both nostrils daily for 14 days.   folic acid (FOLVITE) 1 MG tablet Take 1 mg by mouth daily.   furosemide (LASIX) 20 MG tablet Take 1 tablet (20 mg total) by mouth daily.   Glucosamine-Chondroit-Vit C-Mn (GLUCOSAMINE CHONDR 500 COMPLEX PO) Take 2 tablets by mouth daily.    hydroxychloroquine (PLAQUENIL) 200 MG tablet    losartan (COZAAR) 50 MG tablet Take 1 tablet (50 mg total) by mouth daily.   Menthol, Topical Analgesic, (ASPERCREME MAX ROLL-ON EX) Apply topically.   minocycline (MINOCIN) 100 MG capsule Take 100 mg by mouth 2 (two) times daily.   Multiple Vitamin (MULTIVITAMIN) tablet Take 1 tablet by mouth daily.    naproxen (NAPROSYN) 500 MG tablet Take 1 tablet (500 mg total) by mouth in the morning and at bedtime.   Omega-3 Fatty Acids (FISH OIL) 1000 MG CAPS Take 1,000 mg by mouth daily.    omeprazole (PRILOSEC) 40 MG capsule Take 1 capsule by mouth once daily   predniSONE (DELTASONE) 5 MG tablet TAKE 1 TABLET BY MOUTH WITH BREAKFAST   Propylene Glycol 0.6 % SOLN Place 1 drop into both eyes 2 (two) times daily as needed (dry eyes).    TIROSINT 50 MCG CAPS Take 1 tablet by mouth 3 (three) times a week.   traMADol (ULTRAM) 50 MG tablet Take 1 tablet (50 mg total) by mouth 2 (two) times daily as needed.   tretinoin (RETIN-A) 0.025 % cream tretinoin 0.025 % topical cream  APPLY TO FACE AT BEDTIME   urea (CARMOL) 10 % cream Apply topically as needed.   zinc gluconate 50 MG tablet Take by mouth as needed.   [DISCONTINUED] losartan (COZAAR) 100 MG tablet Take 1 tablet by mouth once daily     Allergies:   Arava [leflunomide], Penicillins, Methotrexate, Penicillamine, Methotrexate derivatives, and Neurontin [gabapentin]   Social History   Socioeconomic History    Marital status: Married    Spouse name: Ramon Dredge   Number of children: 2   Years of education: Not on file   Highest education level: Not on file  Occupational History   Occupation: retired  Tobacco Use   Smoking status: Never   Smokeless tobacco: Never  Vaping Use   Vaping Use: Never used  Substance and Sexual Activity   Alcohol use: No    Alcohol/week: 0.0 standard drinks of alcohol   Drug use: No   Sexual activity: Yes  Other Topics Concern   Not on file  Social History Narrative   Not  on file   Social Determinants of Health   Financial Resource Strain: Low Risk  (10/22/2017)   Overall Financial Resource Strain (CARDIA)    Difficulty of Paying Living Expenses: Not hard at all  Food Insecurity: No Food Insecurity (10/22/2017)   Hunger Vital Sign    Worried About Running Out of Food in the Last Year: Never true    Ran Out of Food in the Last Year: Never true  Transportation Needs: No Transportation Needs (10/22/2017)   PRAPARE - Administrator, Civil Service (Medical): No    Lack of Transportation (Non-Medical): No  Physical Activity: Inactive (10/22/2017)   Exercise Vital Sign    Days of Exercise per Week: 0 days    Minutes of Exercise per Session: 0 min  Stress: Stress Concern Present (10/22/2017)   [employer] of Occupational Health - Occupational Stress Questionnaire    Feeling of Stress : Very much  Social Connections: Moderately Integrated (10/22/2017)   Social Connection and Isolation Panel [NHANES]    Frequency of Communication with Friends and Family: More than three times a week    Frequency of Social Gatherings with Friends and Family: More than three times a week    Attends Religious Services: More than 4 times per year    Active Member of [employer] or Organizations: No    Attends Banker Meetings: Never    Marital Status: Married     Family History: The patient's family history includes Heart attack in her father; Heart disease in  her father. There is no history of Colon cancer, Esophageal cancer, Rectal cancer, or Stomach cancer.  ROS:   Please see the history of present illness. (+) Shortness of Breath (+) Fatigue (+) Back Pain All other systems are reviewed and negative.    EKGs/Labs/Other Studies Reviewed:    The following studies were reviewed today: Outside office notes, prior office notes, prior stress test lab work data reviewed.   EKG:  EKG is personally reviewed.   02/06/2023 sinus rhythm 78 with left anterior fascicular block, poor R wave progression.  01/17/2022 EKG: Rate 79. Sinus Rhythm left anterior fascicular block no changes from prior.  07/15/2021 EKG: The ekg ordered today demonstrates sinus rhythm left anterior fascicular block, nonspecific ST-T wave changes with no other abnormalities, no change from prior.   Echo 08/05/2021:  IMPRESSIONS   1. Left ventricular ejection fraction by 3D volume is 66 %. The left  ventricle has normal function. The left ventricle has no regional wall  motion abnormalities. There is mild left ventricular hypertrophy. Left  ventricular diastolic parameters are  consistent with Grade I diastolic dysfunction (impaired relaxation). The  average left ventricular global longitudinal strain is -18.5 %. The global  longitudinal strain is normal.   2. Normal right ventricular free wall strain at -25.8%. Right ventricular  systolic function is normal. The right ventricular size is normal. There  is normal pulmonary artery systolic pressure. The estimated right  ventricular systolic pressure is 27.8  mmHg.   3. A small pericardial effusion is present. The pericardial effusion is  anterior to the right ventricle. There is no evidence of cardiac  tamponade.   4. The mitral valve is grossly normal. Moderate mitral valve  regurgitation. Mechanism of MR is not clearly demonstrated on this exam.  No evidence of mitral stenosis.   5. Tricuspid valve regurgitation is mild  to moderate.   6. The aortic valve was not well visualized. There is mild calcification  of the aortic valve. Aortic valve regurgitation is trivial. No aortic  stenosis is present.   7. The inferior vena cava is normal in size with greater than 50%  respiratory variability, suggesting right atrial pressure of 3 mmHg.   FINDINGS   Left Ventricle: Left ventricular ejection fraction by 3D volume is 66 %.  The left ventricle has normal function. The left ventricle has no regional  wall motion abnormalities. The average left ventricular global  longitudinal strain is -18.5 %. The global   longitudinal strain is normal. The global longitudinal strain is normal  despite suboptimal segment tracking. The left ventricular internal cavity  size was normal in size. There is mild left ventricular hypertrophy. Left  ventricular diastolic parameters  are consistent with Grade I diastolic dysfunction (impaired relaxation).   Right Ventricle: Normal right ventricular free wall strain at -25.8%. The  right ventricular size is normal. No increase in right ventricular wall  thickness. Right ventricular systolic function is normal. There is normal  pulmonary artery systolic  pressure. The tricuspid regurgitant velocity is 2.49 m/s, and with an  assumed right atrial pressure of 3 mmHg, the estimated right ventricular  systolic pressure is 27.8 mmHg.   Left Atrium: Left atrial size was normal in size.   Right Atrium: Right atrial size was normal in size.   Pericardium: A small pericardial effusion is present. The pericardial  effusion is anterior to the right ventricle. There is no evidence of  cardiac tamponade.   Mitral Valve: The mitral valve is grossly normal. Moderate mitral valve  regurgitation. No evidence of mitral valve stenosis. The mean mitral valve  gradient is 3.2 mmHg with average heart rate of 76 bpm.   Tricuspid Valve: The tricuspid valve is normal in structure. Tricuspid  valve  regurgitation is mild to moderate. No evidence of tricuspid  stenosis.   Aortic Valve: The aortic valve was not well visualized. There is mild  calcification of the aortic valve. Aortic valve regurgitation is trivial.  No aortic stenosis is present.   Pulmonic Valve: The pulmonic valve was normal in structure. Pulmonic valve  regurgitation is mild. No evidence of pulmonic stenosis.   Aorta: The aortic root is normal in size and structure.   Venous: The inferior vena cava is normal in size with greater than 50%  respiratory variability, suggesting right atrial pressure of 3 mmHg.   IAS/Shunts: No atrial level shunt detected by color flow Doppler.   Stress Test 11/05/17: Nuclear stress EF: 75%. The study is normal. This is a low risk study. The left ventricular ejection fraction is hyperdynamic (>65%). There was no ST segment deviation noted during stress. No T wave inversion was noted during stress.    Recent Labs: 04/11/2022: ALT 11 12/06/2022: BUN 31; Creat 1.10; Potassium 4.3; Sodium 141 01/09/2023: Hemoglobin 12.8; Platelet Count 296  Recent Lipid Panel    Component Value Date/Time   CHOL 172 11/17/2019 0859   TRIG 134 11/17/2019 0859   HDL 71 11/17/2019 0859   CHOLHDL 2.4 11/17/2019 0859   LDLCALC 78 11/17/2019 0859     Risk Assessment/Calculations:          Physical Exam:    VS:  BP 120/60   Pulse 78   Ht 4\' 8"  (1.422 m)   Wt 93 lb 9.6 oz (42.5 kg)   SpO2 95%   BMI 20.98 kg/m     Wt Readings from Last 3 Encounters:  02/06/23 93 lb 9.6 oz (42.5 kg)  01/09/23  94 lb 4.8 oz (42.8 kg)  12/06/22 96 lb (43.5 kg)     GEN: Short in stature well nourished, well developed in no acute distress HEENT: Normal NECK: No JVD; No carotid bruits LYMPHATICS: No lymphadenopathy CARDIAC: RRR, soft systolic murmur, no rubs or gallops RESPIRATORY:  Clear to auscultation without rales, wheezing or rhonchi  ABDOMEN: Soft, non-tender, non-distended MUSCULOSKELETAL: Trace  bilateral lower extremity edema; No deformity  SKIN: Warm and dry NEUROLOGIC:  Alert and oriented x 3 PSYCHIATRIC:  Normal affect   ASSESSMENT:    1. Primary hypertension   2. Nonrheumatic mitral valve regurgitation   3. Rheumatoid arthritis, involving unspecified site, unspecified whether rheumatoid factor present (HCC)      PLAN:    In order of problems listed above:  Lower extremity edema Stable.  Doing very well.  No changes.  Hypertension Currently well controlled on losartan 100 mg a day but she is feeling some dizziness at times, washed out.  Blood pressures may be slightly low.  We will decrease her losartan to 50 mg a day.   Mitral regurgitation Seen on echocardiogram 2022.  Described as moderate.  Mild murmur heard on exam today.  We will continue to monitor clinically.  Doing well, no changes.       Medication Adjustments/Labs and Tests Ordered: Current medicines are reviewed at length with the patient today.  Concerns regarding medicines are outlined above.  Orders Placed This Encounter  Procedures   EKG 12-Lead   Meds ordered this encounter  Medications   losartan (COZAAR) 50 MG tablet    Sig: Take 1 tablet (50 mg total) by mouth daily.    Dispense:  90 tablet    Refill:  3    Patient Instructions  Medication Instructions:  Please start Losartan 50 mg a day. Continue all other medications as listed.  *If you need a refill on your cardiac medications before your next appointment, please call your pharmacy*  Follow-Up: At [facility], you and your health needs are our priority.  As part of our continuing mission to provide you with exceptional heart care, we have created designated Provider Care Teams.  These Care Teams include your primary Cardiologist (physician) and Advanced Practice Providers (APPs -  Physician Assistants and Nurse Practitioners) who all work together to provide you with the care you need, when you need it.  We recommend  signing up for the patient portal called "MyChart".  Sign up information is provided on this After Visit Summary.  MyChart is used to connect with patients for Virtual Visits (Telemedicine).  Patients are able to view lab/test results, encounter notes, upcoming appointments, etc.  Non-urgent messages can be sent to your provider as well.   To learn more about what you can do with MyChart, go to [email or web address].    Your next appointment:   1 year(s)  Provider:   Donato Schultz, MD        F/U in 1 year or PRN  I,Tinashe Williams,acting as a scribe for Donato Schultz, MD.,have documented all relevant documentation on the behalf of Donato Schultz, MD,as directed by  Donato Schultz, MD while in the presence of Donato Schultz, MD.   I, Donato Schultz, MD, have reviewed all documentation for this visit. The documentation on 02/06/23 for the exam, diagnosis, procedures, and orders are all accurate and complete.

## 2023-02-06 NOTE — Patient Instructions (Signed)
Medication Instructions:  Please start Losartan 50 mg a day. Continue all other medications as listed.  *If you need a refill on your cardiac medications before your next appointment, please call your pharmacy*  Follow-Up: At [facility], you and your health needs are our priority.  As part of our continuing mission to provide you with exceptional heart care, we have created designated Provider Care Teams.  These Care Teams include your primary Cardiologist (physician) and Advanced Practice Providers (APPs -  Physician Assistants and Nurse Practitioners) who all work together to provide you with the care you need, when you need it.  We recommend signing up for the patient portal called "MyChart".  Sign up information is provided on this After Visit Summary.  MyChart is used to connect with patients for Virtual Visits (Telemedicine).  Patients are able to view lab/test results, encounter notes, upcoming appointments, etc.  Non-urgent messages can be sent to your provider as well.   To learn more about what you can do with MyChart, go to [email or web address].    Your next appointment:   1 year(s)  Provider:   Donato Schultz, MD

## 2023-02-08 DIAGNOSIS — K219 Gastro-esophageal reflux disease without esophagitis: Secondary | ICD-10-CM | POA: Diagnosis not present

## 2023-02-08 DIAGNOSIS — H6123 Impacted cerumen, bilateral: Secondary | ICD-10-CM | POA: Diagnosis not present

## 2023-02-08 DIAGNOSIS — R0982 Postnasal drip: Secondary | ICD-10-CM | POA: Diagnosis not present

## 2023-02-08 DIAGNOSIS — Z681 Body mass index (BMI) 19 or less, adult: Secondary | ICD-10-CM | POA: Diagnosis not present

## 2023-02-13 ENCOUNTER — Ambulatory Visit: Payer: Medicare PPO | Admitting: Podiatry

## 2023-02-13 DIAGNOSIS — L84 Corns and callosities: Secondary | ICD-10-CM

## 2023-02-13 DIAGNOSIS — M79675 Pain in left toe(s): Secondary | ICD-10-CM | POA: Diagnosis not present

## 2023-02-13 DIAGNOSIS — M79674 Pain in right toe(s): Secondary | ICD-10-CM

## 2023-02-13 DIAGNOSIS — B351 Tinea unguium: Secondary | ICD-10-CM | POA: Diagnosis not present

## 2023-02-13 MED ORDER — UREA 10 % EX CREA: TOPICAL_CREAM | CUTANEOUS | 0 refills | Status: AC | PRN

## 2023-02-13 NOTE — Progress Notes (Signed)
  Subjective:  Patient ID: Joan [name], female    DOB: 1946-08-05,  MRN: [id number]  Chief Complaint  Patient presents with   Numbness    Right foot numb on the top. Numbness caused by unna boot being to tight. Patient believes it is better than a month ago. Patient is not diabetic.    Callouses    Callus trim bilateral feet.    Nail Problem    Nail fungus to left hallux. Patient has never received treatment for the fungus.     77 y.o. female presents with the above complaint. History confirmed with patient. Patient presenting with pain related to dystrophic thickened elongated nails. Patient is unable to trim own nails related to nail dystrophy and/or mobility issues. Patient does not have a history of T2DM. Patient does have callus present located at the sub right 2nd met head and sub left 1st met head causing pain.   Objective:  Physical Exam: warm, good capillary refill nail exam onychomycosis of the toenails, onycholysis, and dystrophic nails DP pulses palpable, PT pulses palpable, and protective sensation intact Left Foot:  Pain with palpation of nails due to elongation and dystrophic growth. Callus sub 2nd met head Right Foot: Pain with palpation of nails due to elongation and dystrophic growth. Callus sub 1st met head  Assessment:   1. Pain due to onychomycosis of toenails of both feet   2. Callus of foot      Plan:  Patient was evaluated and treated and all questions answered.  #Hyperkeratotic lesions/pre ulcerative calluses present sub 2nd met head right, sub 1st met head left All symptomatic hyperkeratoses x 2 separate lesions were safely debrided with a sterile #10 blade to patient's level of comfort without incident. We discussed preventative and palliative care of these lesions including supportive and accommodative shoegear, padding, prefabricated and custom molded accommodative orthoses, use of a pumice stone and lotions/creams daily.  #Onychomycosis with  pain  -Nails palliatively debrided as below. -Educated on self-care  Procedure: Nail Debridement Rationale: Pain Type of Debridement: manual, sharp debridement. Instrumentation: Nail nipper, rotary burr. Number of Nails: 10  No follow-ups on file.         Corinna Gab, DPM [facility]

## 2023-02-27 DIAGNOSIS — M810 Age-related osteoporosis without current pathological fracture: Secondary | ICD-10-CM | POA: Diagnosis not present

## 2023-03-01 DIAGNOSIS — H353130 Nonexudative age-related macular degeneration, bilateral, stage unspecified: Secondary | ICD-10-CM | POA: Diagnosis not present

## 2023-03-01 DIAGNOSIS — H04123 Dry eye syndrome of bilateral lacrimal glands: Secondary | ICD-10-CM | POA: Diagnosis not present

## 2023-03-01 DIAGNOSIS — L2089 Other atopic dermatitis: Secondary | ICD-10-CM | POA: Diagnosis not present

## 2023-03-01 DIAGNOSIS — H16221 Keratoconjunctivitis sicca, not specified as Sjogren's, right eye: Secondary | ICD-10-CM | POA: Diagnosis not present

## 2023-03-13 DIAGNOSIS — M4317 Spondylolisthesis, lumbosacral region: Secondary | ICD-10-CM | POA: Diagnosis not present

## 2023-03-20 ENCOUNTER — Encounter: Payer: Self-pay | Admitting: Physician Assistant

## 2023-03-20 DIAGNOSIS — E890 Postprocedural hypothyroidism: Secondary | ICD-10-CM | POA: Diagnosis not present

## 2023-03-20 DIAGNOSIS — M810 Age-related osteoporosis without current pathological fracture: Secondary | ICD-10-CM | POA: Diagnosis not present

## 2023-03-20 LAB — COMPREHENSIVE METABOLIC PANEL
Albumin: 3.7 (ref 3.5–5.0)
Calcium: 8.7 (ref 8.7–10.7)
eGFR: 62

## 2023-03-20 LAB — BASIC METABOLIC PANEL
CO2: 27 — AB (ref 13–22)
Chloride: 106 (ref 99–108)
Creatinine: 1 (ref 0.5–1.1)
Glucose: 90
Potassium: 4.6 mEq/L (ref 3.5–5.1)
Sodium: 141 (ref 137–147)

## 2023-03-20 LAB — TSH: TSH: 5.42 (ref 0.41–5.90)

## 2023-03-20 LAB — VITAMIN D 25 HYDROXY (VIT D DEFICIENCY, FRACTURES): Vit D, 25-Hydroxy: 39.9

## 2023-03-27 DIAGNOSIS — E041 Nontoxic single thyroid nodule: Secondary | ICD-10-CM | POA: Diagnosis not present

## 2023-03-27 DIAGNOSIS — E040 Nontoxic diffuse goiter: Secondary | ICD-10-CM | POA: Diagnosis not present

## 2023-03-27 DIAGNOSIS — E890 Postprocedural hypothyroidism: Secondary | ICD-10-CM | POA: Diagnosis not present

## 2023-03-28 ENCOUNTER — Encounter: Payer: Self-pay | Admitting: Physician Assistant

## 2023-03-29 ENCOUNTER — Other Ambulatory Visit: Payer: Self-pay | Admitting: Family Medicine

## 2023-03-30 NOTE — Telephone Encounter (Signed)
Patient medication has high risk warnings. Medication pend and sent to PCP Joan Meeker Bertram Millard, MD

## 2023-04-04 ENCOUNTER — Ambulatory Visit: Payer: Medicare PPO | Admitting: Family Medicine

## 2023-04-04 ENCOUNTER — Encounter: Payer: Self-pay | Admitting: Family Medicine

## 2023-04-04 VITALS — BP 130/80 | HR 65 | Temp 97.5°F | Resp 16 | Ht <= 58 in | Wt 94.4 lb

## 2023-04-04 DIAGNOSIS — M797 Fibromyalgia: Secondary | ICD-10-CM | POA: Diagnosis not present

## 2023-04-04 DIAGNOSIS — D649 Anemia, unspecified: Secondary | ICD-10-CM

## 2023-04-04 DIAGNOSIS — H6123 Impacted cerumen, bilateral: Secondary | ICD-10-CM | POA: Diagnosis not present

## 2023-04-04 DIAGNOSIS — I10 Essential (primary) hypertension: Secondary | ICD-10-CM | POA: Diagnosis not present

## 2023-04-04 DIAGNOSIS — M069 Rheumatoid arthritis, unspecified: Secondary | ICD-10-CM | POA: Diagnosis not present

## 2023-04-04 DIAGNOSIS — E538 Deficiency of other specified B group vitamins: Secondary | ICD-10-CM | POA: Diagnosis not present

## 2023-04-04 NOTE — Progress Notes (Signed)
1. Bilateral impacted cerumen Right ear successfully irrigated  2. Anemia, unspecified type Patient feels tired would like B12 level checked has been given B12 shots and oral B12 previously

## 2023-04-04 NOTE — Progress Notes (Signed)
Provider:  Jacalyn Lefevre, MD  Careteam: Patient Care Team: Frederica Kuster, MD as PCP - General (Family Medicine) Jake Bathe, MD as PCP - Cardiology (Cardiology) Donnetta Hail, MD as Consulting Physician (Rheumatology) Altheimer, Casimiro Needle, MD as Consulting Physician (Endocrinology) Tressie Stalker, MD as Consulting Physician (Neurosurgery) Odette Fraction, MD (Inactive) as Consulting Physician (Anesthesiology) Benard Rink, Jackquline Denmark, MD (Inactive) as Consulting Physician (Diagnostic Radiology) Olegario Shearer, MD as Referring Physician (Dermatology) Albin Felling, OD as Consulting Physician (Optometry) Felicity Coyer, MD as Referring Physician (Internal Medicine)  PLACE OF SERVICE:  [facility]  Advanced Directive information    Allergies  Allergen Reactions   Arava [Leflunomide] Diarrhea, Nausea Only and Other (See Comments)    Loss appetite   Penicillins Rash and Other (See Comments)    PATIENT HAS HAD A PCN REACTION WITH IMMEDIATE RASH, FACIAL/TONGUE/THROAT SWELLING, SOB, OR LIGHTHEADEDNESS WITH HYPOTENSION:  #  #  YES  #  #  Has patient had a PCN reaction causing severe rash involving mucus membranes or skin necrosis: no Has patient had a PCN reaction that required hospitalization: no    Methotrexate Other (See Comments)   Penicillamine Other (See Comments)   Methotrexate Derivatives Other (See Comments)    Hair loss   Neurontin [Gabapentin] Nausea And Vomiting    Chief Complaint  Patient presents with   Medical Management of Chronic Issues    Patient is here for a follow up for chronic conditions    Quality Metric Gaps    Patient is due for a covid booster     HPI: Patient is a 77 y.o. female chief complaint today is tiredness.  Thinks she needs B12 shots.  Has received before as well as taken orally but she does not think the oral supplementation works as well.  Explained we need to check B12 level before recommending further therapy.  She continues to  see multiple specialties including rheumatology, endocrinology, cardiology, GI    Review of Systems:  Review of Systems  Constitutional:  Positive for malaise/fatigue.  HENT:  Positive for hearing loss.   Respiratory: Negative.    Cardiovascular: Negative.   Musculoskeletal:  Positive for joint pain.  Psychiatric/Behavioral: Negative.    All other systems reviewed and are negative.   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 Lumbo-sacral anterolisthesis     Senile osteoporosis 04/01/03   Sprain of metatarsophalangeal (joint) of foot    Thyrotoxicosis    Thyrotoxicosis without mention of goiter or other cause, without mention of thyrotoxic crisis or storm    Unspecified vitamin D deficiency    Urinary  frequency    Past Surgical History:  Procedure Laterality Date   COLONOSCOPY     COLONOSCOPY N/A 04/15/2015   Procedure: COLONOSCOPY;  Surgeon: Iva Boop, MD;  Location: [facility] ENDOSCOPY;  Service: Endoscopy;  Laterality: N/A;   DG FINGERS, MULTIPLE LT HAND ([facility] HX)     EYE SURGERY     FOOT SURGERY Bilateral 05/22/2014   Dr.Strom    LUMBAR DISC SURGERY  12/12/2017    Dr Lovell Sheehan   REMOVE GANGLION  1998   DR [facility]    RIGHT WRIST SUGERY FOR TENOSYNOVITIS  1997   DR SYPHER    TONSILLECTOMY AND ADENOIDECTOMY  1974   TUBAL LIGATION  1973   Social History:   reports that she has never smoked. She has never used smokeless tobacco. She reports that she does not drink alcohol and does not use drugs.  Family History  Problem Relation Age of Onset   Heart attack Father        age 49   Heart disease Father    Colon cancer Neg Hx    Esophageal cancer Neg Hx    Rectal cancer Neg Hx    Stomach cancer Neg Hx     Medications: Patient's Medications  New Prescriptions   No medications on file  Previous Medications   ACETAMINOPHEN (TYLENOL) 650 MG CR TABLET    Take 1,300 mg by mouth at bedtime.   AMITRIPTYLINE (ELAVIL) 10 MG TABLET    TAKE 1 TABLET BY MOUTH ONCE DAILY AT BEDTIME FOR MUSCLE RELAXATION   BIOTIN 1000 MCG TABLET    Take 1 tablet (1 mg total) by mouth 2 (two) times daily.   BUDESONIDE (ENTOCORT EC) 3 MG 24 HR CAPSULE    Take 3 capsules (9 mg total) by mouth daily.   CALCIUM CARB-CHOLECALCIFEROL (CALTRATE 600+D3) 600-800 MG-UNIT TABS    Take 1 tablet by mouth 2 (two) times daily.   CHOLECALCIFEROL (VITAMIN D3) 50 MCG (2000 UT) CAPSULE    Take 1 capsule (2,000 Units total) by mouth daily.   CITALOPRAM (CELEXA) 10 MG TABLET    Take 1 tablet (10 mg total) by mouth daily.   CYCLOSPORINE (RESTASIS) 0.05 % OPHTHALMIC EMULSION    Place 1 drop into both eyes 2 (two) times daily.   DENOSUMAB (PROLIA) 60 MG/ML SOSY INJECTION    Inject 60 mg into the skin every 6 (six) months.   DIPHENOXYLATE-ATROPINE (LOMOTIL) 2.5-0.025 MG TABLET    Take 1-2 tablets by mouth every 8 (eight) hours as needed.   ETANERCEPT (ENBREL) 50 MG/ML INJECTION    Inject 50 mg into the skin once a week.   FLUTICASONE (FLONASE) 50 MCG/ACT NASAL SPRAY    Place 1 spray into both nostrils daily for 14 days.   FOLIC ACID (FOLVITE) 1 MG TABLET    Take 1 mg by mouth daily.   FUROSEMIDE (LASIX)  20 MG TABLET    Take 1 tablet (20 mg total) by mouth daily.   GLUCOSAMINE-CHONDROIT-VIT C-MN (GLUCOSAMINE CHONDR 500 COMPLEX PO)    Take 2 tablets by mouth daily.    HYDROXYCHLOROQUINE (PLAQUENIL) 200 MG TABLET       LOSARTAN (COZAAR) 50 MG TABLET    Take 1 tablet (50 mg total) by mouth daily.   MENTHOL, TOPICAL ANALGESIC, (ASPERCREME MAX ROLL-ON EX)    Apply topically.   MINOCYCLINE (MINOCIN) 100 MG CAPSULE    Take 100 mg by mouth 2 (two) times daily.   MULTIPLE VITAMIN (MULTIVITAMIN) TABLET  Take 1 tablet by mouth daily.    NAPROXEN (NAPROSYN) 500 MG TABLET    Take 1 tablet (500 mg total) by mouth in the morning and at bedtime.   OMEGA-3 FATTY ACIDS (FISH OIL) 1000 MG CAPS    Take 1,000 mg by mouth daily.    OMEPRAZOLE (PRILOSEC) 40 MG CAPSULE    Take 1 capsule by mouth once daily   PREDNISONE (DELTASONE) 5 MG TABLET    TAKE 1 TABLET BY MOUTH WITH BREAKFAST   PROPYLENE GLYCOL 0.6 % SOLN    Place 1 drop into both eyes 2 (two) times daily as needed (dry eyes).    TIROSINT 50 MCG CAPS    Take 1 tablet by mouth once a week.   TRAMADOL (ULTRAM) 50 MG TABLET    Take 1 tablet (50 mg total) by mouth 2 (two) times daily as needed.   TRETINOIN (RETIN-A) 0.025 % CREAM    tretinoin 0.025 % topical cream  APPLY TO FACE AT BEDTIME   UREA (CARMOL) 10 % CREAM    Apply topically as needed.   UREA (CARMOL) 10 % CREAM    Apply topically as needed.   ZINC GLUCONATE 50 MG TABLET    Take by mouth as needed.  Modified Medications   No medications on file  Discontinued Medications   No medications on file    Physical Exam:  Vitals:   04/04/23 0825  BP: 130/80  Pulse: 65  Resp: 16  Temp: (!) 97.5 F (36.4 C)  SpO2: 98%  Weight: 94 lb 6.4 oz (42.8 kg)  Height: 4\' 8"  (1.422 m)   Body mass index is 21.16 kg/m. Wt Readings from Last 3 Encounters:  04/04/23 94 lb 6.4 oz (42.8 kg)  02/06/23 93 lb 9.6 oz (42.5 kg)  01/09/23 94 lb 4.8 oz (42.8 kg)    Physical Exam Vitals and nursing note reviewed.   Constitutional:      Appearance: Normal appearance.  Cardiovascular:     Rate and Rhythm: Normal rate and regular rhythm.  Pulmonary:     Effort: Pulmonary effort is normal.     Breath sounds: Normal breath sounds.  Musculoskeletal:     Comments: Multiple joints on hands and fingers affected by arthritis  Neurological:     General: No focal deficit present.     Mental Status: She is alert and oriented to person, place, and time.     Labs reviewed: Basic Metabolic Panel: Recent Labs    05/02/22 1021 08/30/22 0920 09/14/22 0911 12/06/22 1003 03/20/23 0000  NA 141 140  --  141 141  K 4.6 4.4  --  4.3 4.6  CL 105 109  --  104 106  CO2 27 24  --  28 27*  GLUCOSE 77 77  --  79  --   BUN 30* 25  --  31*  --   CREATININE 1.12* 0.92  --  1.10* 1.0  CALCIUM 9.2 7.8* 8.0* 8.8 8.7  TSH  --   --   --   --  5.42   Liver Function Tests: Recent Labs    04/11/22 1309 03/20/23 0000  AST 14*  --   ALT 11  --   ALKPHOS 47  --   BILITOT 0.3  --   PROT 6.6  --   ALBUMIN 3.7 3.7   No results for input(s): "LIPASE", "AMYLASE" in the last 8760 hours. No results for input(s): "AMMONIA" in the last 8760 hours. CBC: Recent Labs  07/13/22 1020 10/12/22 1033 01/09/23 0940  WBC 13.8* 10.6* 12.7*  NEUTROABS 10.9* 8.2* 9.4*  HGB 11.7* 11.7* 12.8  HCT 35.3* 35.7* 39.0  MCV 94.4 97.0 97.0  PLT 309 306 296   Lipid Panel: No results for input(s): "CHOL", "HDL", "LDLCALC", "TRIG", "CHOLHDL", "LDLDIRECT" in the last 8760 hours. TSH: Recent Labs    03/20/23 0000  TSH 5.42   A1C: Lab Results  Component Value Date   HGBA1C 5.2 11/17/2019     Assessment/Plan  1. Bilateral impacted cerumen Ears irrigated successfully  2. Anemia, unspecified type Complains of tiredness.  Will check B12 level but hemoglobin is good at 12.5    4. Fibromyalgia Patient continues on antidepressant as well as low-dose amitriptyline for chronic pain.  Also takes tramadol  5. Primary  hypertension She thinks her blood pressure may be but 130/90 today continues with losartan 50 mg a day  6. Rheumatoid arthritis, involving unspecified site, unspecified whether rheumatoid factor present (HCC) By rheumatology currently taking Enbrel weekly as well as Naprosyn   Jacalyn Lefevre, MD [facility] [phone]

## 2023-04-13 ENCOUNTER — Encounter: Payer: Self-pay | Admitting: Family Medicine

## 2023-04-16 ENCOUNTER — Encounter: Payer: Self-pay | Admitting: Family Medicine

## 2023-04-17 ENCOUNTER — Encounter: Payer: Self-pay | Admitting: Family Medicine

## 2023-05-15 ENCOUNTER — Ambulatory Visit: Payer: Medicare PPO | Admitting: Podiatry

## 2023-05-15 DIAGNOSIS — M79675 Pain in left toe(s): Secondary | ICD-10-CM

## 2023-05-15 DIAGNOSIS — B351 Tinea unguium: Secondary | ICD-10-CM

## 2023-05-15 DIAGNOSIS — M79674 Pain in right toe(s): Secondary | ICD-10-CM | POA: Diagnosis not present

## 2023-05-15 DIAGNOSIS — L97921 Non-pressure chronic ulcer of unspecified part of left lower leg limited to breakdown of skin: Secondary | ICD-10-CM | POA: Diagnosis not present

## 2023-05-15 DIAGNOSIS — L97911 Non-pressure chronic ulcer of unspecified part of right lower leg limited to breakdown of skin: Secondary | ICD-10-CM | POA: Diagnosis not present

## 2023-05-15 DIAGNOSIS — T148XXA Other injury of unspecified body region, initial encounter: Secondary | ICD-10-CM | POA: Diagnosis not present

## 2023-05-15 DIAGNOSIS — L84 Corns and callosities: Secondary | ICD-10-CM

## 2023-05-15 DIAGNOSIS — L0390 Cellulitis, unspecified: Secondary | ICD-10-CM | POA: Diagnosis not present

## 2023-05-15 NOTE — Progress Notes (Signed)
  Subjective:  Patient ID: Joan [name], female    DOB: 20-Sep-1945,  MRN: [id number]  Chief Complaint  Patient presents with   Nail Problem    Routine Foot Care   Callouses    Callus trim to bilateral feet.     77 y.o. female presents with the above complaint. History confirmed with patient. Patient presenting with pain related to dystrophic thickened elongated nails. Patient is unable to trim own nails related to nail dystrophy and/or mobility issues. Patient does not have a history of T2DM. Patient does have callus present located at the sub right 2nd met head and sub left 1st met head causing pain.   Objective:  Physical Exam: warm, good capillary refill nail exam onychomycosis of the toenails, onycholysis, and dystrophic nails DP pulses palpable, PT pulses palpable, and protective sensation intact Left Foot:  Pain with palpation of nails due to elongation and dystrophic growth. Callus sub 2nd met head Right Foot: Pain with palpation of nails due to elongation and dystrophic growth. Callus sub 1st met head  Assessment:   1. Pain due to onychomycosis of toenails of both feet   2. Callus of foot     Plan:  Patient was evaluated and treated and all questions answered.  #Hyperkeratotic lesions/pre ulcerative calluses present sub 2nd met head right, sub 1st met head left All symptomatic hyperkeratoses x 2 separate lesions were safely debrided with a sterile #10 blade to patient's level of comfort without incident. We discussed preventative and palliative care of these lesions including supportive and accommodative shoegear, padding, prefabricated and custom molded accommodative orthoses, use of a pumice stone and lotions/creams daily.  #Onychomycosis with pain  -Nails palliatively debrided as below. -Educated on self-care  Procedure: Nail Debridement Rationale: Pain Type of Debridement: manual, sharp debridement. Instrumentation: Nail nipper, rotary burr. Number of Nails:  10  Return in about 3 months (around 08/14/2023) for RFC.         Corinna Gab, DPM [facility]

## 2023-05-22 ENCOUNTER — Ambulatory Visit: Payer: Medicare PPO | Admitting: Internal Medicine

## 2023-05-22 DIAGNOSIS — T148XXA Other injury of unspecified body region, initial encounter: Secondary | ICD-10-CM | POA: Diagnosis not present

## 2023-05-22 DIAGNOSIS — L97921 Non-pressure chronic ulcer of unspecified part of left lower leg limited to breakdown of skin: Secondary | ICD-10-CM | POA: Diagnosis not present

## 2023-05-22 DIAGNOSIS — L97911 Non-pressure chronic ulcer of unspecified part of right lower leg limited to breakdown of skin: Secondary | ICD-10-CM | POA: Diagnosis not present

## 2023-05-28 DIAGNOSIS — E890 Postprocedural hypothyroidism: Secondary | ICD-10-CM | POA: Diagnosis not present

## 2023-05-30 ENCOUNTER — Ambulatory Visit: Payer: Medicare PPO | Admitting: Internal Medicine

## 2023-05-30 ENCOUNTER — Encounter: Payer: Self-pay | Admitting: Internal Medicine

## 2023-05-30 VITALS — BP 130/60 | HR 62 | Ht <= 58 in | Wt 98.0 lb

## 2023-05-30 DIAGNOSIS — K52839 Microscopic colitis, unspecified: Secondary | ICD-10-CM

## 2023-05-30 DIAGNOSIS — R1310 Dysphagia, unspecified: Secondary | ICD-10-CM

## 2023-05-30 DIAGNOSIS — K219 Gastro-esophageal reflux disease without esophagitis: Secondary | ICD-10-CM

## 2023-05-30 DIAGNOSIS — K52831 Collagenous colitis: Secondary | ICD-10-CM | POA: Diagnosis not present

## 2023-05-30 DIAGNOSIS — K259 Gastric ulcer, unspecified as acute or chronic, without hemorrhage or perforation: Secondary | ICD-10-CM

## 2023-05-30 MED ORDER — OMEPRAZOLE 40 MG PO CPDR
40.0000 mg | DELAYED_RELEASE_CAPSULE | Freq: Every day | ORAL | 3 refills | Status: DC
Start: 2023-05-30 — End: 2023-10-31

## 2023-05-30 MED ORDER — DIPHENOXYLATE-ATROPINE 2.5-0.025 MG PO TABS: 1.0000 | ORAL_TABLET | Freq: Three times a day (TID) | ORAL | 3 refills | Status: DC | PRN

## 2023-05-30 MED ORDER — BUDESONIDE 3 MG PO CPEP: 9.0000 mg | ORAL_CAPSULE | Freq: Every day | ORAL | 3 refills | Status: DC

## 2023-05-30 NOTE — Patient Instructions (Signed)
We have sent the following medications to your pharmacy for you to pick up at your convenience:  Omeprazole, Lomotil, Budesonide  You have been scheduled for an endoscopy. Please follow written instructions given to you at your visit today.  If you use inhalers (even only as needed), please bring them with you on the day of your procedure.  If you take any of the following medications, they will need to be adjusted prior to your procedure:   DO NOT TAKE 7 DAYS PRIOR TO TEST- Trulicity (dulaglutide) Ozempic, Wegovy (semaglutide) Mounjaro (tirzepatide) Bydureon Bcise (exanatide extended release)  DO NOT TAKE 1 DAY PRIOR TO YOUR TEST Rybelsus (semaglutide) Adlyxin (lixisenatide) Victoza (liraglutide) Byetta (exanatide) ___________________________________________________________________________    _______________________________________________________  If your blood pressure at your visit was 140/90 or greater, please contact your primary care physician to follow up on this.  _______________________________________________________  If you are age 17 or older, your body mass index should be between 23-30. Your Body mass index is 21.97 kg/m. If this is out of the aforementioned range listed, please consider follow up with your Primary Care Provider.  If you are age 65 or younger, your body mass index should be between 19-25. Your Body mass index is 21.97 kg/m. If this is out of the aformentioned range listed, please consider follow up with your Primary Care Provider.   ________________________________________________________  The [facility] providers would like to encourage you to use [facility] to communicate with providers for non-urgent requests or questions.  Due to long hold times on the telephone, sending your provider a message by [facility] may be a faster and more efficient way to get a response.  Please allow 48 business hours for a response.  Please remember that this is for  non-urgent requests.  _______________________________________________________

## 2023-05-30 NOTE — Progress Notes (Signed)
HISTORY OF PRESENT ILLNESS:  Joan [name] is a 77 y.o. female with multiple medical problems including deforming rheumatoid arthritis who presents today for follow-up regarding the following problems:  1.  Neck scopic colitis diagnosed in December 2020.  Chronic diarrhea. 2.  GERD complicated by peptic stricture.  Now with recurrent dysphagia 3.  History of iron deficiency anemia secondary to NSAID induced gastric ulcers  Patient was last seen in this office September 08, 2022.  See that dictation for details.  States at that time we increased her budesonide to 9 mg daily.  She was continued on omeprazole for GERD.  She follows up with hematology regarding her iron deficiency anemia.  She did contact the office intermittently.  We have tried to wean her budesonide.  However, she seems to do best on 9 mg daily.  Currently with 3 bowel movements in the morning.  1 formed.  2 loose.  Not taking Lomotil, but requests a refill.  She continues on omeprazole for her reflux.  No active reflux symptoms.  She does report recurrent problems with dysphagia to large pills and solids.  She did undergo upper endoscopy with esophageal dilation in July 2023.  20 mm balloon.  She states this was helpful.  Recurrent swallowing issues have occurred over the past several months.  REVIEW OF SYSTEMS:  All non-GI ROS negative unless otherwise stated in the HPI except for easy bruisability and bleeding, lower extremity edema, arthritic pain  Past Medical History:  Diagnosis Date   Alopecia areata 02/16/2006   Anxiety state, unspecified 2004   Benign paroxysmal positional vertigo 04/01/03   Cancer (HCC)    skin    Cervicalgia 04/01/03   Chronic rhinitis 09/01/05   Corns and callosities    Dermatophytosis of nail    Diarrhea 04/15/2015   Diverticulosis    Dysphagia, unspecified(787.20) 2004   Enthesopathy of ankle and tarsus, unspecified 03/31/96   External hemorrhoids without mention of complication     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 Lumbo-sacral anterolisthesis     Senile osteoporosis 04/01/03   Sprain of metatarsophalangeal (joint) of foot    Thyrotoxicosis    Thyrotoxicosis without mention of goiter or other cause, without mention of thyrotoxic crisis or storm    Unspecified vitamin D deficiency    Urinary frequency     Past Surgical History:  Procedure Laterality Date   COLONOSCOPY     COLONOSCOPY N/A 04/15/2015   Procedure: COLONOSCOPY;  Surgeon: Iva Boop, MD;  Location: [facility] END[facility];  Service: Endoscopy;  Laterality: N/A;   DG FINGERS, MULTIPLE LT HAND ([facility] HX)     EYE SURGERY     FOOT SURGERY Bilateral 05/22/2014   Dr.Strom    LUMBAR DISC SURGERY  12/12/2017   Dr Lovell Sheehan   REMOVE GANGLION  1998   DR [facility]    RIGHT WRIST SUGERY FOR TENOSYNOVITIS  1997   DR SYPHER    TONSILLECTOMY AND ADENOIDECTOMY  1974   TUBAL LIGATION  1973    Social History Joan [name]  reports that she has never smoked. She has never used smokeless tobacco. She  reports that she does not drink alcohol and does not use drugs.  family history includes Heart attack in her father; Heart disease in her father.  Allergies  Allergen Reactions   Arava [Leflunomide] Diarrhea, Nausea Only and Other (See Comments)    Loss appetite   Penicillins Rash and Other (See Comments)    PATIENT HAS HAD A PCN REACTION WITH IMMEDIATE RASH, FACIAL/TONGUE/THROAT SWELLING, SOB, OR LIGHTHEADEDNESS WITH HYPOTENSION:  #  #  YES  #  #  Has patient had a PCN reaction causing severe rash involving mucus membranes or skin necrosis: no Has patient had a PCN  reaction that required hospitalization: no    Methotrexate Other (See Comments)   Penicillamine Other (See Comments)   Methotrexate Derivatives Other (See Comments)    Hair loss   Neurontin [Gabapentin] Nausea And Vomiting       PHYSICAL EXAMINATION: Vital signs: BP 130/60   Pulse 62   Ht 4\' 8"  (1.422 m)   Wt 98 lb (44.5 kg)   BMI 21.97 kg/m   Constitutional: Petite chronically ill-appearing female, no acute distress Psychiatric: alert and oriented x3, cooperative Eyes: extraocular movements intact, anicteric, conjunctiva pink Mouth: oral pharynx moist, no lesions Neck: supple no lymphadenopathy Cardiovascular: heart regular rate and rhythm, no murmur Lungs: clear to auscultation bilaterally Abdomen: soft, nontender, nondistended, no obvious ascites, no peritoneal signs, normal bowel sounds, no organomegaly Rectal: Omitted Extremities: no clubbing or cyanosis.  1+ lower extremity edema bilaterally.  Deforming changes on the hands Skin: no lesions on visible extremities save ecchymoses and scarring Neuro: No focal deficits.  Cranial nerves intact  ASSESSMENT:   1.  Microscopic colitis.  Seemingly well-managed on budesonide 9 mg daily.  We discussed her attempts to retaper the medication.  Not needing Lomotil as frequently as previous 2.  GERD complicated by peptic stricture.  Again reports dysphagia to large pills.  She points to the cervical esophagus.  However, also some solid foods.  Cutting large pills, which helps.  She does feel that repeat dilation might be helpful and wishes to schedule such. 3.  Iron deficiency anemia secondary to NSAID induced ulcers.  On PPI.  Being followed by hematology.  Good response to IV iron.     PLAN:   1.  Continue budesonide to 9 mg daily.  Prescription refilled.  Medication risk reviewed.  Attempted taper by 3 mg monthly if possible. 2.  Continue omeprazole daily.  Prescription refilled.  Medication risks reviewed 3.  Continue to follow  with hematology regarding iron deficiency anemia 4.  Refill Lomotil.  Medication risk reviewed 5.  Schedule upper endoscopy with esophageal dilation.  The patient is high risk given her age and comorbidities. 6.  Routine office follow-up 6 months  Total time of 40 minutes was spent preparing to see the patient, reviewing data, obtaining history, performing medically appropriate physical examination, counseling the patient and her husband regarding the above listed issues, ordering multiple medications, scheduling therapeutic endoscopic procedure, directing follow-up plans, and documenting clinical information in the health record

## 2023-06-07 ENCOUNTER — Encounter: Payer: Self-pay | Admitting: Internal Medicine

## 2023-06-07 ENCOUNTER — Ambulatory Visit: Payer: Medicare PPO | Admitting: Internal Medicine

## 2023-06-07 VITALS — BP 168/99 | HR 72 | Temp 98.1°F | Resp 15 | Ht <= 58 in | Wt 98.0 lb

## 2023-06-07 DIAGNOSIS — R1310 Dysphagia, unspecified: Secondary | ICD-10-CM

## 2023-06-07 DIAGNOSIS — M797 Fibromyalgia: Secondary | ICD-10-CM | POA: Diagnosis not present

## 2023-06-07 DIAGNOSIS — E039 Hypothyroidism, unspecified: Secondary | ICD-10-CM | POA: Diagnosis not present

## 2023-06-07 DIAGNOSIS — K219 Gastro-esophageal reflux disease without esophagitis: Secondary | ICD-10-CM | POA: Diagnosis not present

## 2023-06-07 DIAGNOSIS — K222 Esophageal obstruction: Secondary | ICD-10-CM | POA: Diagnosis not present

## 2023-06-07 DIAGNOSIS — I10 Essential (primary) hypertension: Secondary | ICD-10-CM | POA: Diagnosis not present

## 2023-06-07 DIAGNOSIS — F419 Anxiety disorder, unspecified: Secondary | ICD-10-CM | POA: Diagnosis not present

## 2023-06-07 NOTE — Progress Notes (Signed)
Called to room to assist during endoscopic procedure.  Patient ID and intended procedure confirmed with present staff. Received instructions for my participation in the procedure from the performing physician.  

## 2023-06-07 NOTE — Progress Notes (Signed)
Pt stated that she was okay to go to the bathroom by herself.

## 2023-06-07 NOTE — Progress Notes (Signed)
Pt's states no medical or surgical changes since previsit or office visit. 

## 2023-06-07 NOTE — Op Note (Signed)
[facility] Patient Name: Joan [name] Procedure Date: 06/07/2023 10:29 AM MRN: [id number] Endoscopist: Wilhemina Bonito. [name] , MD, [id number] Age: 77 Referring MD:  Date of Birth: 1946/09/01 Gender: Female Account #: [id number] Procedure:                Upper GI endoscopy with balloon dilation esophagua                            18-94mm Indications:              Dysphagia, Esophageal reflux Medicines:                Monitored Anesthesia Care Procedure:                Pre-Anesthesia Assessment:                           - Prior to the procedure, a History and Physical                            was performed, and patient medications and                            allergies were reviewed. The patient's tolerance of                            previous anesthesia was also reviewed. The risks                            and benefits of the procedure and the sedation                            options and risks were discussed with the patient.                            All questions were answered, and informed consent                            was obtained. Prior Anticoagulants: The patient has                            taken no anticoagulant or antiplatelet agents. ASA                            Grade Assessment: III - A patient with severe                            systemic disease. After reviewing the risks and                            benefits, the patient was deemed in satisfactory                            condition to undergo the procedure.  After obtaining informed consent, the endoscope was                            passed under direct vision. Throughout the                            procedure, the patient's blood pressure, pulse, and                            oxygen saturations were monitored continuously. The                            GIF [id number] #[id number] was introduced through the                            mouth, and advanced to the second  part of duodenum.                            The upper GI endoscopy was accomplished without                            difficulty. The patient tolerated the procedure                            well. Scope In: Scope Out: Findings:                 The cricopharyngeus muscle is tight. The exam of                            the esophagus was otherwise normal.                           One benign-appearing, intrinsic moderate stenosis                            was found 30 cm from the incisors. This stenosis                            measured 1.5 cm (inner diameter). A TTS dilator was                            passed through the scope. Dilation with an 18-19-20                            mm balloon dilator was performed to 20 mm.                           The stomach was normal, save large H/H.                           The examined duodenum was normal.                           The cardia and  gastric fundus were normal on                            retroflexion. Complications:            No immediate complications. Estimated Blood Loss:     Estimated blood loss: none. Impression:               - Tight UES.                           - Benign-appearing esophageal stenosis. Dilated.                           - Normal stomach with large H/H                           - Normal examined duodenum.                           - No specimens collected. Recommendation:           - Patient has a contact number available for                            emergencies. The signs and symptoms of potential                            delayed complications were discussed with the                            patient. Return to normal activities tomorrow.                            Written discharge instructions were provided to the                            patient.                           - Resume previous diet.                           - Continue present medications.                           - Office  follow up 6 months [name] N. [name], MD 06/07/2023 10:51:15 AM This report has been signed electronically.

## 2023-06-07 NOTE — Progress Notes (Signed)
Uneventful anesthetic. Report to pacu rn. Vss. Care resumed by rn. 

## 2023-06-07 NOTE — Progress Notes (Signed)
Expand All Collapse All HISTORY OF PRESENT ILLNESS:   Joan [name] is a 77 y.o. female with multiple medical problems including deforming rheumatoid arthritis who presents today for follow-up regarding the following problems:   1.  Neck scopic colitis diagnosed in December 2020.  Chronic diarrhea. 2.  GERD complicated by peptic stricture.  Now with recurrent dysphagia 3.  History of iron deficiency anemia secondary to NSAID induced gastric ulcers   Patient was last seen in this office September 08, 2022.  See that dictation for details.  States at that time we increased her budesonide to 9 mg daily.  She was continued on omeprazole for GERD.  She follows up with hematology regarding her iron deficiency anemia.  She did contact the office intermittently.  We have tried to wean her budesonide.  However, she seems to do best on 9 mg daily.  Currently with 3 bowel movements in the morning.  1 formed.  2 loose.  Not taking Lomotil, but requests a refill.   She continues on omeprazole for her reflux.  No active reflux symptoms.  She does report recurrent problems with dysphagia to large pills and solids.  She did undergo upper endoscopy with esophageal dilation in July 2023.  20 mm balloon.  She states this was helpful.  Recurrent swallowing issues have occurred over the past several months.   REVIEW OF SYSTEMS:   All non-GI ROS negative unless otherwise stated in the HPI except for easy bruisability and bleeding, lower extremity edema, arthritic pain       Past Medical History:  Diagnosis Date   Alopecia areata 02/16/2006   Anxiety state, unspecified 2004   Benign paroxysmal positional vertigo 04/01/03   Cancer (HCC)      skin    Cervicalgia 04/01/03   Chronic rhinitis 09/01/05   Corns and callosities     Dermatophytosis of nail     Diarrhea 04/15/2015   Diverticulosis     Dysphagia, unspecified(787.20) 2004   Enthesopathy of ankle and tarsus, unspecified 03/31/96   External hemorrhoids  without mention of complication     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 Lumbo-sacral anterolisthesis     Senile osteoporosis 04/01/03   Sprain of metatarsophalangeal (joint) of foot     Thyrotoxicosis     Thyrotoxicosis without mention of goiter or other cause, without mention of thyrotoxic crisis or storm     Unspecified vitamin D deficiency     Urinary frequency                 Past Surgical History:  Procedure Laterality Date   COLONOSCOPY       COLONOSCOPY N/A 04/15/2015    Procedure: COLONOSCOPY;  Surgeon: Iva Boop, MD;  Location: [facility] ENDOSCOPY;  Service: Endoscopy;  Laterality: N/A;   DG FINGERS, MULTIPLE LT HAND ([facility] HX)       EYE SURGERY       FOOT SURGERY Bilateral 05/22/2014    Dr.Strom    LUMBAR DISC SURGERY   12/12/2017    Dr Lovell Sheehan   REMOVE GANGLION  1998    DR SYPHER    RIGHT WRIST SUGERY FOR TENOSYNOVITIS   1997    DR SYPHER    TONSILLECTOMY AND ADENOIDECTOMY   1974   TUBAL LIGATION   1973          Social History Joan [name]  reports that she has never smoked. She has never used smokeless tobacco. She reports that she does not drink alcohol and does not use drugs.   family history includes Heart attack in her father; Heart disease in her father.   Allergies       Allergies  Allergen Reactions   Arava [Leflunomide] Diarrhea, Nausea Only and Other (See Comments)      Loss appetite   Penicillins Rash and Other (See Comments)      PATIENT HAS HAD A PCN REACTION WITH IMMEDIATE RASH, FACIAL/TONGUE/THROAT SWELLING, SOB, OR LIGHTHEADEDNESS WITH HYPOTENSION:  #  #  YES  #  #  Has  patient had a PCN reaction causing severe rash involving mucus membranes or skin necrosis: no Has patient had a PCN reaction that required hospitalization: no     Methotrexate Other (See Comments)   Penicillamine Other (See Comments)   Methotrexate Derivatives Other (See Comments)      Hair loss   Neurontin [Gabapentin] Nausea And Vomiting            PHYSICAL EXAMINATION: Vital signs: BP 130/60   Pulse 62   Ht 4\' 8"  (1.422 m)   Wt 98 lb (44.5 kg)   BMI 21.97 kg/m   Constitutional: Petite chronically ill-appearing female, no acute distress Psychiatric: alert and oriented x3, cooperative Eyes: extraocular movements intact, anicteric, conjunctiva pink Mouth: oral pharynx moist, no lesions Neck: supple no lymphadenopathy Cardiovascular: heart regular rate and rhythm, no murmur Lungs: clear to auscultation bilaterally Abdomen: soft, nontender, nondistended, no obvious ascites, no peritoneal signs, normal bowel sounds, no organomegaly Rectal: Omitted Extremities: no clubbing or cyanosis.  1+ lower extremity edema bilaterally.  Deforming changes on the hands Skin: no lesions on visible extremities save ecchymoses and scarring Neuro: No focal deficits.  Cranial nerves intact   ASSESSMENT:   1.  Microscopic colitis.  Seemingly well-managed on budesonide 9 mg daily.  We discussed her attempts to retaper the medication.  Not needing Lomotil as frequently as previous 2.  GERD complicated by peptic stricture.  Again reports dysphagia to large pills.  She points to the cervical esophagus.  However, also some solid foods.  Cutting large pills, which helps.  She does feel that repeat dilation might be helpful and wishes to schedule such. 3.  Iron deficiency anemia secondary to NSAID induced ulcers.  On PPI.  Being followed by hematology.  Good response to IV iron.     PLAN:   1.  Continue budesonide to 9 mg daily.  Prescription refilled.  Medication risk reviewed.  Attempted taper by 3 mg  monthly if possible. 2.  Continue omeprazole daily.  Prescription refilled.  Medication risks reviewed 3.  Continue to follow with hematology regarding iron deficiency anemia 4.  Refill Lomotil.  Medication risk reviewed 5.  Schedule upper endoscopy with esophageal dilation.  The patient is high risk given her age and comorbidities. 6.  Routine office follow-up 6 months

## 2023-06-07 NOTE — Patient Instructions (Addendum)
Resume all of your previous medications today as ordered.  Read discharge instructions given to you by your recovery room nurse.  YOU HAD AN ENDOSCOPIC PROCEDURE TODAY AT THE [facility]:   Refer to the procedure report that was given to you for any specific questions about what was found during the examination.  If the procedure report does not answer your questions, please call your gastroenterologist to clarify.  If you requested that your care partner not be given the details of your procedure findings, then the procedure report has been included in a sealed envelope for you to review at your convenience later.  YOU SHOULD EXPECT: Some feelings of bloating in the abdomen. Passage of more gas than usual.   Please Note:  You might notice some irritation and congestion in your nose or some drainage.  This is from the oxygen used during your procedure.  There is no need for concern and it should clear up in a day or so.  SYMPTOMS TO REPORT IMMEDIATELY:   Following upper endoscopy (EGD)  Vomiting of blood or coffee ground material  New chest pain or pain under the shoulder blades  Painful or persistently difficult swallowing  New shortness of breath  Fever of 100F or higher  Black, tarry-looking stools  For urgent or emergent issues, a gastroenterologist can be reached at any hour by calling [phone]. Do not use MyChart messaging for urgent concerns.    DIET:  We do recommend a small meal at first, but then you may proceed to your regular diet.  Drink plenty of fluids but you should avoid alcoholic beverages for 24 hours.  ACTIVITY:  You should plan to take it easy for the rest of today and you should NOT DRIVE or use heavy machinery until tomorrow (because of the sedation medicines used during the test).    FOLLOW UP: Our staff will call the number listed on your records the next business day following your procedure.  We will call around 7:15- 8:00 am to check on you  and address any questions or concerns that you may have regarding the information given to you following your procedure. If we do not reach you, we will leave a message.     If any biopsies were taken you will be contacted by phone or by letter within the next 1-3 weeks.  Please call us at [phone] if you have not heard about the biopsies in 3 weeks.    SIGNATURES/CONFIDENTIALITY: You and/or your care partner have signed paperwork which will be entered into your electronic medical record.  These signatures attest to the fact that that the information above on your After Visit Summary has been reviewed and is understood.  Full responsibility of the confidentiality of this discharge information lies with you and/or your care-partner.

## 2023-06-08 ENCOUNTER — Telehealth: Payer: Self-pay | Admitting: *Deleted

## 2023-06-08 NOTE — Telephone Encounter (Signed)
  Follow up Call-     06/07/2023   10:02 AM 03/16/2022    8:32 AM  Call back number  Post procedure Call Back phone  # [phone]  Permission to leave phone message Yes Yes     Patient questions:  Do you have a fever, pain , or abdominal swelling? No. Pain Score  0 *  Have you tolerated food without any problems? Yes.    Have you been able to return to your normal activities? Yes.    Do you have any questions about your discharge instructions: Diet   No. Medications  No. Follow up visit  No.  Do you have questions or concerns about your Care? No.  Actions: * If pain score is 4 or above: No action needed, pain <4.

## 2023-06-19 ENCOUNTER — Other Ambulatory Visit: Payer: Self-pay | Admitting: Sports Medicine

## 2023-06-19 DIAGNOSIS — Z1231 Encounter for screening mammogram for malignant neoplasm of breast: Secondary | ICD-10-CM

## 2023-06-26 DIAGNOSIS — H04123 Dry eye syndrome of bilateral lacrimal glands: Secondary | ICD-10-CM | POA: Diagnosis not present

## 2023-06-26 DIAGNOSIS — H16221 Keratoconjunctivitis sicca, not specified as Sjogren's, right eye: Secondary | ICD-10-CM | POA: Diagnosis not present

## 2023-06-26 DIAGNOSIS — H353130 Nonexudative age-related macular degeneration, bilateral, stage unspecified: Secondary | ICD-10-CM | POA: Diagnosis not present

## 2023-06-26 DIAGNOSIS — H52223 Regular astigmatism, bilateral: Secondary | ICD-10-CM | POA: Diagnosis not present

## 2023-06-28 DIAGNOSIS — I872 Venous insufficiency (chronic) (peripheral): Secondary | ICD-10-CM | POA: Diagnosis not present

## 2023-06-28 DIAGNOSIS — L97911 Non-pressure chronic ulcer of unspecified part of right lower leg limited to breakdown of skin: Secondary | ICD-10-CM | POA: Diagnosis not present

## 2023-06-28 DIAGNOSIS — L97921 Non-pressure chronic ulcer of unspecified part of left lower leg limited to breakdown of skin: Secondary | ICD-10-CM | POA: Diagnosis not present

## 2023-07-02 DIAGNOSIS — Z681 Body mass index (BMI) 19 or less, adult: Secondary | ICD-10-CM | POA: Diagnosis not present

## 2023-07-02 DIAGNOSIS — R1310 Dysphagia, unspecified: Secondary | ICD-10-CM | POA: Diagnosis not present

## 2023-07-10 ENCOUNTER — Inpatient Hospital Stay: Payer: Medicare PPO | Admitting: Internal Medicine

## 2023-07-10 ENCOUNTER — Inpatient Hospital Stay: Payer: Medicare PPO | Attending: Internal Medicine

## 2023-07-10 VITALS — BP 162/70 | HR 78 | Temp 98.3°F | Resp 15 | Ht <= 58 in | Wt 90.7 lb

## 2023-07-10 DIAGNOSIS — D500 Iron deficiency anemia secondary to blood loss (chronic): Secondary | ICD-10-CM | POA: Diagnosis not present

## 2023-07-10 DIAGNOSIS — K922 Gastrointestinal hemorrhage, unspecified: Secondary | ICD-10-CM | POA: Insufficient documentation

## 2023-07-10 DIAGNOSIS — D508 Other iron deficiency anemias: Secondary | ICD-10-CM

## 2023-07-10 LAB — IRON AND IRON BINDING CAPACITY (CC-WL,HP ONLY)
Iron: 52 ug/dL (ref 28–170)
Saturation Ratios: 16 % (ref 10.4–31.8)
TIBC: 323 ug/dL (ref 250–450)
UIBC: 271 ug/dL (ref 148–442)

## 2023-07-10 LAB — CBC WITH DIFFERENTIAL (CANCER CENTER ONLY)
Abs Immature Granulocytes: 0.15 10*3/uL — ABNORMAL HIGH (ref 0.00–0.07)
Basophils Absolute: 0 10*3/uL (ref 0.0–0.1)
Basophils Relative: 0 %
Eosinophils Absolute: 0 10*3/uL (ref 0.0–0.5)
Eosinophils Relative: 0 %
HCT: 38.1 % (ref 36.0–46.0)
Hemoglobin: 12.5 g/dL (ref 12.0–15.0)
Immature Granulocytes: 1 %
Lymphocytes Relative: 14 %
Lymphs Abs: 1.9 10*3/uL (ref 0.7–4.0)
MCH: 31.8 pg (ref 26.0–34.0)
MCHC: 32.8 g/dL (ref 30.0–36.0)
MCV: 96.9 fL (ref 80.0–100.0)
Monocytes Absolute: 0.9 10*3/uL (ref 0.1–1.0)
Monocytes Relative: 7 %
Neutro Abs: 10.7 10*3/uL — ABNORMAL HIGH (ref 1.7–7.7)
Neutrophils Relative %: 78 %
Platelet Count: 342 10*3/uL (ref 150–400)
RBC: 3.93 MIL/uL (ref 3.87–5.11)
RDW: 12.9 % (ref 11.5–15.5)
WBC Count: 13.7 10*3/uL — ABNORMAL HIGH (ref 4.0–10.5)
nRBC: 0 % (ref 0.0–0.2)

## 2023-07-10 NOTE — Progress Notes (Signed)
[facility] Telephone:([phone]   Fax:([phone]  OFFICE PROGRESS NOTE  Venita Sheffield, MD [address] [place] [address]  DIAGNOSIS: Iron deficiency anemia secondary to gastrointestinal blood loss.  PRIOR THERAPY: Iron infusion with Venofer 300 Mg IV weekly for 3 weeks.  CURRENT THERAPY: Iron rich diet  INTERVAL HISTORY: Joan [name] 77 y.o. female returns to the clinic today for follow-up visit accompanied by her husband.Discussed the use of AI scribe software for clinical note transcription with the patient, who gave verbal consent to proceed.  History of Present Illness   Joan [name], a 77 year old patient with a history of iron deficiency anemia secondary to gastrointestinal blood loss, presented for follow-up. The patient had previously received iron infusions for the same issue. However, at the time of the visit, the patient was not on iron supplements and was attempting to manage the condition through dietary changes.  The patient's recent blood work showed a hemoglobin level of 12.5 and hematocrit of 38.1, indicating an improvement in the anemia. However, the white blood cell count was slightly elevated, suggesting possible inflammation. The ferritin level was pending at the time of the visit.  The patient also has a history of osteoarthritis and has been experiencing issues with leg wounds, particularly on the right leg. These wounds have been causing significant bleeding and have been treated by a dermatologist for several years. The nature of the leg wounds and the specific treatment provided by the dermatologist were not specified. The patient's leg issues have been causing inflammation, which could be contributing to the elevated white blood cell count.       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 Lumbo-sacral anterolisthesis     Senile osteoporosis 04/01/03   Sprain of metatarsophalangeal (joint) of foot    Thyrotoxicosis    Thyrotoxicosis without mention of goiter or other cause, without mention of thyrotoxic crisis or storm    Unspecified vitamin D deficiency    Urinary frequency     ALLERGIES:  is allergic to arava [leflunomide], penicillins, methotrexate, penicillamine, methotrexate derivatives, and neurontin [gabapentin].  MEDICATIONS:  Current Outpatient Medications  Medication Sig Dispense Refill   acetaminophen (TYLENOL) 650 MG CR tablet Take 1,300 mg by mouth at bedtime. (Patient not taking: Reported on 06/07/2023)     amitriptyline (ELAVIL) 10 MG tablet TAKE 1 TABLET BY MOUTH ONCE DAILY AT BEDTIME FOR MUSCLE RELAXATION 90 tablet 0   Biotin 1000 MCG tablet Take 1 tablet (1 mg total) by mouth 2 (two) times daily. 60 tablet 3   budesonide (ENTOCORT EC) 3  MG 24 hr capsule Take 3 capsules (9 mg total) by mouth daily. 270 capsule 3   Calcium Carb-Cholecalciferol (CALTRATE 600+D3) 600-800 MG-UNIT TABS Take 1 tablet by mouth 2 (two) times daily. 60 tablet 11   Cholecalciferol (VITAMIN D3) 50 MCG (2000 UT) capsule Take 1 capsule (2,000 Units total) by mouth daily. 30  capsule 3   citalopram (CELEXA) 10 MG tablet Take 1 tablet (10 mg total) by mouth daily. (Patient not taking: Reported on 06/07/2023) 30 tablet 3   cycloSPORINE (RESTASIS) 0.05 % ophthalmic emulsion Place 1 drop into both eyes 2 (two) times daily.     denosumab (PROLIA) 60 MG/ML SOSY injection Inject 60 mg into the skin every 6 (six) months. (Patient not taking: Reported on 06/07/2023) 1 mL 1   diphenoxylate-atropine (LOMOTIL) 2.5-0.025 MG tablet Take 1-2 tablets by mouth every 8 (eight) hours as needed. 60 tablet 3   etanercept (ENBREL) 50 MG/ML injection Inject 50 mg into the skin once a week.     fluticasone (FLONASE) 50 MCG/ACT nasal spray Place 1 spray into both nostrils daily for 14 days. (Patient not taking: Reported on 06/07/2023) 1 g 0   folic acid (FOLVITE) 1 MG tablet Take 1 mg by mouth daily.     furosemide (LASIX) 20 MG tablet Take 1 tablet (20 mg total) by mouth daily. (Patient not taking: Reported on 06/07/2023) 90 tablet 3   Glucosamine-Chondroit-Vit C-Mn (GLUCOSAMINE CHONDR 500 COMPLEX PO) Take 2 tablets by mouth daily.     losartan (COZAAR) 50 MG tablet Take 1 tablet (50 mg total) by mouth daily. 90 tablet 3   Menthol, Topical Analgesic, (ASPERCREME MAX ROLL-ON EX) Apply topically. (Patient not taking: Reported on 06/07/2023)     minocycline (MINOCIN) 100 MG capsule Take 100 mg by mouth 2 (two) times daily. (Patient not taking: Reported on 06/07/2023)     Multiple Vitamin (MULTIVITAMIN) tablet Take 1 tablet by mouth daily.     mupirocin ointment (BACTROBAN) 2 % Apply 1 Application topically 2 (two) times daily.     naproxen (NAPROSYN) 500 MG tablet Take 1 tablet (500 mg total) by mouth in the morning and at bedtime. 180 tablet 2   Omega-3 Fatty Acids (FISH OIL) 1000 MG CAPS Take 1,000 mg by mouth daily.     omeprazole (PRILOSEC) 40 MG capsule Take 1 capsule (40 mg total) by mouth daily. (Patient not taking: Reported on 06/07/2023) 90 capsule 3   predniSONE (DELTASONE) 5 MG tablet TAKE 1  TABLET BY MOUTH WITH BREAKFAST 90 tablet 0   Propylene Glycol 0.6 % SOLN Place 1 drop into both eyes 2 (two) times daily as needed (dry eyes).     TIROSINT 50 MCG CAPS Take 1 tablet by mouth once a week.     traMADol (ULTRAM) 50 MG tablet Take 1 tablet (50 mg total) by mouth 2 (two) times daily as needed. (Patient not taking: Reported on 06/07/2023) 60 tablet 5   tretinoin (RETIN-A) 0.025 % cream      urea (CARMOL) 10 % cream Apply topically as needed. (Patient not taking: Reported on 06/07/2023) 71 g 0   zinc gluconate 50 MG tablet Take by mouth as needed. (Patient not taking: Reported on 06/07/2023)     No current facility-administered medications for this visit.    SURGICAL HISTORY:  Past Surgical History:  Procedure Laterality Date   COLONOSCOPY     COLONOSCOPY N/A 04/15/2015   Procedure: COLONOSCOPY;  Surgeon: Iva Boop, MD;  Location: [facility] ENDOSCOPY;  Service: Endoscopy;  Laterality: N/A;   DG FINGERS, MULTIPLE LT HAND ([facility] HX)     EYE SURGERY     FOOT SURGERY Bilateral 05/22/2014   Dr.Strom    LUMBAR DISC SURGERY  12/12/2017   Dr Lovell Sheehan   REMOVE GANGLION  1998   DR [facility]    RIGHT WRIST SUGERY FOR TENOSYNOVITIS  1997   DR SYPHER    TONSILLECTOMY AND ADENOIDECTOMY  1974   TUBAL LIGATION  1973    REVIEW OF SYSTEMS:  A comprehensive review of systems was negative except for: Constitutional: positive for fatigue   PHYSICAL EXAMINATION: General appearance: alert, cooperative, and no distress Head: Normocephalic, without obvious abnormality, atraumatic Neck: no adenopathy, no JVD, supple, symmetrical, trachea midline, and thyroid not enlarged, symmetric, no tenderness/mass/nodules Lymph nodes: Cervical, supraclavicular, and axillary nodes normal. Resp: clear to auscultation bilaterally Back: symmetric, no curvature. ROM normal. No CVA tenderness. Cardio: regular rate and rhythm, S1, S2 normal, no murmur, click, rub or gallop GI: soft, non-tender; bowel sounds normal; no  masses,  no organomegaly Extremities: extremities normal, atraumatic, no cyanosis or edema  ECOG PERFORMANCE STATUS: 1 - Symptomatic but completely ambulatory  Blood pressure (!) 162/70, pulse 78, temperature 98.3 F (36.8 C), temperature source Oral, resp. rate 15, height 4\' 8"  (1.422 m), weight 90 lb 11.2 oz (41.1 kg), SpO2 100%.  LABORATORY DATA: Lab Results  Component Value Date   WBC 13.7 (H) 07/10/2023   HGB 12.5 07/10/2023   HCT 38.1 07/10/2023   MCV 96.9 07/10/2023   PLT 342 07/10/2023      Chemistry      Component Value Date/Time   NA 141 03/20/2023 0000   K 4.6 03/20/2023 0000   CL 106 03/20/2023 0000   CO2 27 (A) 03/20/2023 0000   BUN 31 (H) 12/06/2022 1003   BUN 13 02/02/2016 0840   CREATININE 1.0 03/20/2023 0000   CREATININE 1.10 (H) 12/06/2022 1003   GLU 90 03/20/2023 0000      Component Value Date/Time   CALCIUM 8.7 03/20/2023 0000   ALKPHOS 47 04/11/2022 1309   AST 14 (L) 04/11/2022 1309   ALT 11 04/11/2022 1309   BILITOT 0.3 04/11/2022 1309       RADIOGRAPHIC STUDIES: No results found.  ASSESSMENT AND PLAN: This is a very pleasant 77 years old white female with history of iron deficiency anemia secondary to gastrointestinal blood loss.  The patient has intolerance to the oral iron tablets. She was treated with iron infusion with Venofer 300 mg IV weekly for 3 weeks and tolerated it well.  The patient is currently on observation with iron rich diet. Assessment and Plan    Iron Deficiency Anemia Stable with hemoglobin of 12.5 and hematocrit of 38.1. Iron level is 52 with saturation of 16%. Ferritin pending. Patient is not currently on iron supplements but is attempting to maintain an iron-rich diet. -Continue iron-rich diet. -Repeat blood work in 6 months.  Elevated White Blood Cell Count Mildly elevated, possibly due to inflammation from osteoarthritis or leg wounds. -Monitor WBC count.  Leg Wounds Chronic leg wounds, possibly contributing to  elevated WBC count. Patient has seen a dermatologist but no definitive diagnosis or treatment plan has been established. -Recommend referral to a wound clinic for further evaluation and management.  Follow-up in 6 months.  She was advised to call immediately if she has any other concerning symptoms in the interval.    The patient voices understanding of current disease status and treatment options  and is in agreement with the current care plan. All questions were answered. The patient knows to call the clinic with any problems, questions or concerns. We can certainly see the patient much sooner if necessary.  The total time spent in the appointment was 20 minutes.  Disclaimer: This note was dictated with voice recognition software. Similar sounding words can inadvertently be transcribed and may not be corrected upon review.

## 2023-07-11 LAB — FERRITIN: Ferritin: 87 ng/mL (ref 11–307)

## 2023-07-12 ENCOUNTER — Other Ambulatory Visit: Payer: Self-pay | Admitting: Family Medicine

## 2023-07-12 ENCOUNTER — Ambulatory Visit: Payer: Medicare PPO

## 2023-07-13 NOTE — Telephone Encounter (Signed)
Patient is requesting another 90 day supply. Medication pend and sent to Mast, Man, NP due to PCP Venita Sheffield, MD being out of office.

## 2023-07-17 ENCOUNTER — Other Ambulatory Visit: Payer: Self-pay | Admitting: Family Medicine

## 2023-07-17 DIAGNOSIS — M0579 Rheumatoid arthritis with rheumatoid factor of multiple sites without organ or systems involvement: Secondary | ICD-10-CM | POA: Diagnosis not present

## 2023-07-20 ENCOUNTER — Other Ambulatory Visit: Payer: Self-pay | Admitting: Family Medicine

## 2023-07-24 DIAGNOSIS — L97812 Non-pressure chronic ulcer of other part of right lower leg with fat layer exposed: Secondary | ICD-10-CM | POA: Diagnosis not present

## 2023-07-24 DIAGNOSIS — I83018 Varicose veins of right lower extremity with ulcer other part of lower leg: Secondary | ICD-10-CM | POA: Diagnosis not present

## 2023-07-24 DIAGNOSIS — L97822 Non-pressure chronic ulcer of other part of left lower leg with fat layer exposed: Secondary | ICD-10-CM | POA: Diagnosis not present

## 2023-07-24 DIAGNOSIS — L97818 Non-pressure chronic ulcer of other part of right lower leg with other specified severity: Secondary | ICD-10-CM | POA: Diagnosis not present

## 2023-07-30 ENCOUNTER — Other Ambulatory Visit: Payer: Self-pay

## 2023-07-30 DIAGNOSIS — I83018 Varicose veins of right lower extremity with ulcer other part of lower leg: Secondary | ICD-10-CM | POA: Diagnosis not present

## 2023-07-30 DIAGNOSIS — M810 Age-related osteoporosis without current pathological fracture: Secondary | ICD-10-CM | POA: Insufficient documentation

## 2023-07-30 DIAGNOSIS — I739 Peripheral vascular disease, unspecified: Secondary | ICD-10-CM | POA: Diagnosis not present

## 2023-07-30 DIAGNOSIS — L97822 Non-pressure chronic ulcer of other part of left lower leg with fat layer exposed: Secondary | ICD-10-CM | POA: Diagnosis not present

## 2023-07-30 DIAGNOSIS — I872 Venous insufficiency (chronic) (peripheral): Secondary | ICD-10-CM | POA: Diagnosis not present

## 2023-07-30 DIAGNOSIS — L97818 Non-pressure chronic ulcer of other part of right lower leg with other specified severity: Secondary | ICD-10-CM | POA: Diagnosis not present

## 2023-07-30 DIAGNOSIS — L97812 Non-pressure chronic ulcer of other part of right lower leg with fat layer exposed: Secondary | ICD-10-CM | POA: Diagnosis not present

## 2023-07-31 ENCOUNTER — Telehealth: Payer: Self-pay | Admitting: Pharmacy Technician

## 2023-07-31 ENCOUNTER — Ambulatory Visit: Payer: Medicare PPO

## 2023-07-31 ENCOUNTER — Ambulatory Visit: Payer: Medicare PPO | Admitting: Family

## 2023-07-31 NOTE — Telephone Encounter (Signed)
Auth Submission: APPROVED Site of care: Site of care: [facility] Payer: [employer] Medication & CPT/J Code(s) submitted: Prolia (Denosumab) [id number] Route of submission (phone, fax, portal):  Phone # Fax # Auth type: Buy/Bill PB Units/visits requested: X2 DOSES Reference number: [id number] Approval from: 09/05/23 to 09/03/24

## 2023-08-01 ENCOUNTER — Ambulatory Visit: Payer: Medicare PPO | Admitting: Family Medicine

## 2023-08-01 DIAGNOSIS — Z23 Encounter for immunization: Secondary | ICD-10-CM | POA: Diagnosis not present

## 2023-08-07 DIAGNOSIS — L97822 Non-pressure chronic ulcer of other part of left lower leg with fat layer exposed: Secondary | ICD-10-CM | POA: Diagnosis not present

## 2023-08-07 DIAGNOSIS — L97818 Non-pressure chronic ulcer of other part of right lower leg with other specified severity: Secondary | ICD-10-CM | POA: Diagnosis not present

## 2023-08-07 DIAGNOSIS — L97212 Non-pressure chronic ulcer of right calf with fat layer exposed: Secondary | ICD-10-CM | POA: Diagnosis not present

## 2023-08-07 DIAGNOSIS — I872 Venous insufficiency (chronic) (peripheral): Secondary | ICD-10-CM | POA: Diagnosis not present

## 2023-08-07 DIAGNOSIS — M069 Rheumatoid arthritis, unspecified: Secondary | ICD-10-CM | POA: Diagnosis not present

## 2023-08-07 DIAGNOSIS — T148XXA Other injury of unspecified body region, initial encounter: Secondary | ICD-10-CM | POA: Diagnosis not present

## 2023-08-07 DIAGNOSIS — I83028 Varicose veins of left lower extremity with ulcer other part of lower leg: Secondary | ICD-10-CM | POA: Diagnosis not present

## 2023-08-07 DIAGNOSIS — I83018 Varicose veins of right lower extremity with ulcer other part of lower leg: Secondary | ICD-10-CM | POA: Diagnosis not present

## 2023-08-07 DIAGNOSIS — I83012 Varicose veins of right lower extremity with ulcer of calf: Secondary | ICD-10-CM | POA: Diagnosis not present

## 2023-08-10 ENCOUNTER — Ambulatory Visit: Payer: Medicare PPO | Admitting: Family

## 2023-08-13 ENCOUNTER — Ambulatory Visit: Payer: Medicare PPO | Admitting: Podiatry

## 2023-08-14 DIAGNOSIS — M069 Rheumatoid arthritis, unspecified: Secondary | ICD-10-CM | POA: Diagnosis not present

## 2023-08-14 DIAGNOSIS — I83028 Varicose veins of left lower extremity with ulcer other part of lower leg: Secondary | ICD-10-CM | POA: Diagnosis not present

## 2023-08-14 DIAGNOSIS — L97212 Non-pressure chronic ulcer of right calf with fat layer exposed: Secondary | ICD-10-CM | POA: Diagnosis not present

## 2023-08-14 DIAGNOSIS — L97812 Non-pressure chronic ulcer of other part of right lower leg with fat layer exposed: Secondary | ICD-10-CM | POA: Diagnosis not present

## 2023-08-14 DIAGNOSIS — I83012 Varicose veins of right lower extremity with ulcer of calf: Secondary | ICD-10-CM | POA: Diagnosis not present

## 2023-08-14 DIAGNOSIS — L97822 Non-pressure chronic ulcer of other part of left lower leg with fat layer exposed: Secondary | ICD-10-CM | POA: Diagnosis not present

## 2023-08-14 DIAGNOSIS — I83018 Varicose veins of right lower extremity with ulcer other part of lower leg: Secondary | ICD-10-CM | POA: Diagnosis not present

## 2023-08-21 DIAGNOSIS — I83018 Varicose veins of right lower extremity with ulcer other part of lower leg: Secondary | ICD-10-CM | POA: Diagnosis not present

## 2023-08-21 DIAGNOSIS — I83893 Varicose veins of bilateral lower extremities with other complications: Secondary | ICD-10-CM | POA: Diagnosis not present

## 2023-08-21 DIAGNOSIS — R600 Localized edema: Secondary | ICD-10-CM | POA: Diagnosis not present

## 2023-08-21 DIAGNOSIS — L97822 Non-pressure chronic ulcer of other part of left lower leg with fat layer exposed: Secondary | ICD-10-CM | POA: Diagnosis not present

## 2023-08-21 DIAGNOSIS — I83012 Varicose veins of right lower extremity with ulcer of calf: Secondary | ICD-10-CM | POA: Diagnosis not present

## 2023-08-21 DIAGNOSIS — L97212 Non-pressure chronic ulcer of right calf with fat layer exposed: Secondary | ICD-10-CM | POA: Diagnosis not present

## 2023-08-21 DIAGNOSIS — I83028 Varicose veins of left lower extremity with ulcer other part of lower leg: Secondary | ICD-10-CM | POA: Diagnosis not present

## 2023-08-21 DIAGNOSIS — M069 Rheumatoid arthritis, unspecified: Secondary | ICD-10-CM | POA: Diagnosis not present

## 2023-08-21 DIAGNOSIS — L97812 Non-pressure chronic ulcer of other part of right lower leg with fat layer exposed: Secondary | ICD-10-CM | POA: Diagnosis not present

## 2023-08-24 ENCOUNTER — Ambulatory Visit: Payer: Medicare PPO

## 2023-08-24 ENCOUNTER — Ambulatory Visit: Payer: Medicare PPO | Admitting: Family

## 2023-08-28 DIAGNOSIS — I83012 Varicose veins of right lower extremity with ulcer of calf: Secondary | ICD-10-CM | POA: Diagnosis not present

## 2023-08-28 DIAGNOSIS — L97212 Non-pressure chronic ulcer of right calf with fat layer exposed: Secondary | ICD-10-CM | POA: Diagnosis not present

## 2023-08-28 DIAGNOSIS — L97812 Non-pressure chronic ulcer of other part of right lower leg with fat layer exposed: Secondary | ICD-10-CM | POA: Diagnosis not present

## 2023-08-28 DIAGNOSIS — M069 Rheumatoid arthritis, unspecified: Secondary | ICD-10-CM | POA: Diagnosis not present

## 2023-08-28 DIAGNOSIS — L97822 Non-pressure chronic ulcer of other part of left lower leg with fat layer exposed: Secondary | ICD-10-CM | POA: Diagnosis not present

## 2023-08-28 DIAGNOSIS — R600 Localized edema: Secondary | ICD-10-CM | POA: Diagnosis not present

## 2023-08-28 DIAGNOSIS — I83028 Varicose veins of left lower extremity with ulcer other part of lower leg: Secondary | ICD-10-CM | POA: Diagnosis not present

## 2023-08-28 DIAGNOSIS — I83893 Varicose veins of bilateral lower extremities with other complications: Secondary | ICD-10-CM | POA: Diagnosis not present

## 2023-08-28 DIAGNOSIS — I83018 Varicose veins of right lower extremity with ulcer other part of lower leg: Secondary | ICD-10-CM | POA: Diagnosis not present

## 2023-08-30 ENCOUNTER — Ambulatory Visit (HOSPITAL_BASED_OUTPATIENT_CLINIC_OR_DEPARTMENT_OTHER): Payer: Medicare PPO | Admitting: General Surgery

## 2023-08-31 DIAGNOSIS — E890 Postprocedural hypothyroidism: Secondary | ICD-10-CM | POA: Diagnosis not present

## 2023-08-31 DIAGNOSIS — E041 Nontoxic single thyroid nodule: Secondary | ICD-10-CM | POA: Diagnosis not present

## 2023-09-03 DIAGNOSIS — L97818 Non-pressure chronic ulcer of other part of right lower leg with other specified severity: Secondary | ICD-10-CM | POA: Diagnosis not present

## 2023-09-03 DIAGNOSIS — M069 Rheumatoid arthritis, unspecified: Secondary | ICD-10-CM | POA: Diagnosis not present

## 2023-09-03 DIAGNOSIS — I83012 Varicose veins of right lower extremity with ulcer of calf: Secondary | ICD-10-CM | POA: Diagnosis not present

## 2023-09-03 DIAGNOSIS — I83018 Varicose veins of right lower extremity with ulcer other part of lower leg: Secondary | ICD-10-CM | POA: Diagnosis not present

## 2023-09-03 DIAGNOSIS — I872 Venous insufficiency (chronic) (peripheral): Secondary | ICD-10-CM | POA: Diagnosis not present

## 2023-09-03 DIAGNOSIS — I83028 Varicose veins of left lower extremity with ulcer other part of lower leg: Secondary | ICD-10-CM | POA: Diagnosis not present

## 2023-09-03 DIAGNOSIS — L97822 Non-pressure chronic ulcer of other part of left lower leg with fat layer exposed: Secondary | ICD-10-CM | POA: Diagnosis not present

## 2023-09-03 DIAGNOSIS — L97212 Non-pressure chronic ulcer of right calf with fat layer exposed: Secondary | ICD-10-CM | POA: Diagnosis not present

## 2023-09-04 DIAGNOSIS — M810 Age-related osteoporosis without current pathological fracture: Secondary | ICD-10-CM | POA: Diagnosis not present

## 2023-09-07 DIAGNOSIS — L97822 Non-pressure chronic ulcer of other part of left lower leg with fat layer exposed: Secondary | ICD-10-CM | POA: Diagnosis not present

## 2023-09-07 DIAGNOSIS — L97815 Non-pressure chronic ulcer of other part of right lower leg with muscle involvement without evidence of necrosis: Secondary | ICD-10-CM | POA: Diagnosis not present

## 2023-09-07 DIAGNOSIS — I872 Venous insufficiency (chronic) (peripheral): Secondary | ICD-10-CM | POA: Diagnosis not present

## 2023-09-11 DIAGNOSIS — L97822 Non-pressure chronic ulcer of other part of left lower leg with fat layer exposed: Secondary | ICD-10-CM | POA: Diagnosis not present

## 2023-09-11 DIAGNOSIS — L97815 Non-pressure chronic ulcer of other part of right lower leg with muscle involvement without evidence of necrosis: Secondary | ICD-10-CM | POA: Diagnosis not present

## 2023-09-11 DIAGNOSIS — I872 Venous insufficiency (chronic) (peripheral): Secondary | ICD-10-CM | POA: Diagnosis not present

## 2023-09-13 ENCOUNTER — Ambulatory Visit: Payer: Medicare PPO | Admitting: Family

## 2023-09-13 ENCOUNTER — Ambulatory Visit: Payer: Medicare PPO

## 2023-09-18 DIAGNOSIS — L97815 Non-pressure chronic ulcer of other part of right lower leg with muscle involvement without evidence of necrosis: Secondary | ICD-10-CM | POA: Diagnosis not present

## 2023-09-18 DIAGNOSIS — L97822 Non-pressure chronic ulcer of other part of left lower leg with fat layer exposed: Secondary | ICD-10-CM | POA: Diagnosis not present

## 2023-09-18 DIAGNOSIS — I872 Venous insufficiency (chronic) (peripheral): Secondary | ICD-10-CM | POA: Diagnosis not present

## 2023-09-18 DIAGNOSIS — I83018 Varicose veins of right lower extremity with ulcer other part of lower leg: Secondary | ICD-10-CM | POA: Diagnosis not present

## 2023-09-21 ENCOUNTER — Other Ambulatory Visit: Payer: Self-pay | Admitting: Nurse Practitioner

## 2023-09-21 NOTE — Telephone Encounter (Signed)
Message sent to admin staff to schedule 2 month follow-up

## 2023-09-21 NOTE — Telephone Encounter (Signed)
High risk or very high risk warning populated when attempting to refill medication. RX request sent to PCP for review and approval if warranted.

## 2023-09-21 NOTE — Telephone Encounter (Signed)
Joan Sheffield, MD to Me Regarding result: amitriptyline (ELAVIL) 10 MG tablet     09/21/23  9:50 AM Plz schedule visit with me in 2 months

## 2023-09-26 DIAGNOSIS — I872 Venous insufficiency (chronic) (peripheral): Secondary | ICD-10-CM | POA: Diagnosis not present

## 2023-09-26 DIAGNOSIS — L97822 Non-pressure chronic ulcer of other part of left lower leg with fat layer exposed: Secondary | ICD-10-CM | POA: Diagnosis not present

## 2023-09-26 DIAGNOSIS — L97815 Non-pressure chronic ulcer of other part of right lower leg with muscle involvement without evidence of necrosis: Secondary | ICD-10-CM | POA: Diagnosis not present

## 2023-09-26 DIAGNOSIS — I83018 Varicose veins of right lower extremity with ulcer other part of lower leg: Secondary | ICD-10-CM | POA: Diagnosis not present

## 2023-10-02 DIAGNOSIS — I83893 Varicose veins of bilateral lower extremities with other complications: Secondary | ICD-10-CM | POA: Diagnosis not present

## 2023-10-02 DIAGNOSIS — I83018 Varicose veins of right lower extremity with ulcer other part of lower leg: Secondary | ICD-10-CM | POA: Diagnosis not present

## 2023-10-02 DIAGNOSIS — R609 Edema, unspecified: Secondary | ICD-10-CM | POA: Diagnosis not present

## 2023-10-02 DIAGNOSIS — L97815 Non-pressure chronic ulcer of other part of right lower leg with muscle involvement without evidence of necrosis: Secondary | ICD-10-CM | POA: Diagnosis not present

## 2023-10-02 DIAGNOSIS — L97812 Non-pressure chronic ulcer of other part of right lower leg with fat layer exposed: Secondary | ICD-10-CM | POA: Diagnosis not present

## 2023-10-02 DIAGNOSIS — L97822 Non-pressure chronic ulcer of other part of left lower leg with fat layer exposed: Secondary | ICD-10-CM | POA: Diagnosis not present

## 2023-10-02 DIAGNOSIS — I872 Venous insufficiency (chronic) (peripheral): Secondary | ICD-10-CM | POA: Diagnosis not present

## 2023-10-05 ENCOUNTER — Encounter: Payer: Self-pay | Admitting: Internal Medicine

## 2023-10-08 DIAGNOSIS — L97822 Non-pressure chronic ulcer of other part of left lower leg with fat layer exposed: Secondary | ICD-10-CM | POA: Diagnosis not present

## 2023-10-08 DIAGNOSIS — I872 Venous insufficiency (chronic) (peripheral): Secondary | ICD-10-CM | POA: Diagnosis not present

## 2023-10-08 DIAGNOSIS — I83018 Varicose veins of right lower extremity with ulcer other part of lower leg: Secondary | ICD-10-CM | POA: Diagnosis not present

## 2023-10-08 DIAGNOSIS — R600 Localized edema: Secondary | ICD-10-CM | POA: Diagnosis not present

## 2023-10-08 DIAGNOSIS — L97815 Non-pressure chronic ulcer of other part of right lower leg with muscle involvement without evidence of necrosis: Secondary | ICD-10-CM | POA: Diagnosis not present

## 2023-10-15 DIAGNOSIS — R600 Localized edema: Secondary | ICD-10-CM | POA: Diagnosis not present

## 2023-10-15 DIAGNOSIS — L97822 Non-pressure chronic ulcer of other part of left lower leg with fat layer exposed: Secondary | ICD-10-CM | POA: Diagnosis not present

## 2023-10-15 DIAGNOSIS — I872 Venous insufficiency (chronic) (peripheral): Secondary | ICD-10-CM | POA: Diagnosis not present

## 2023-10-15 DIAGNOSIS — L97815 Non-pressure chronic ulcer of other part of right lower leg with muscle involvement without evidence of necrosis: Secondary | ICD-10-CM | POA: Diagnosis not present

## 2023-10-17 ENCOUNTER — Ambulatory Visit: Payer: Medicare PPO | Admitting: Sports Medicine

## 2023-10-17 ENCOUNTER — Ambulatory Visit: Payer: Medicare PPO

## 2023-10-22 DIAGNOSIS — I872 Venous insufficiency (chronic) (peripheral): Secondary | ICD-10-CM | POA: Diagnosis not present

## 2023-10-22 DIAGNOSIS — I83018 Varicose veins of right lower extremity with ulcer other part of lower leg: Secondary | ICD-10-CM | POA: Diagnosis not present

## 2023-10-22 DIAGNOSIS — L97815 Non-pressure chronic ulcer of other part of right lower leg with muscle involvement without evidence of necrosis: Secondary | ICD-10-CM | POA: Diagnosis not present

## 2023-10-22 DIAGNOSIS — L97822 Non-pressure chronic ulcer of other part of left lower leg with fat layer exposed: Secondary | ICD-10-CM | POA: Diagnosis not present

## 2023-10-22 DIAGNOSIS — R600 Localized edema: Secondary | ICD-10-CM | POA: Diagnosis not present

## 2023-10-29 DIAGNOSIS — I872 Venous insufficiency (chronic) (peripheral): Secondary | ICD-10-CM | POA: Diagnosis not present

## 2023-10-29 DIAGNOSIS — L97815 Non-pressure chronic ulcer of other part of right lower leg with muscle involvement without evidence of necrosis: Secondary | ICD-10-CM | POA: Diagnosis not present

## 2023-10-29 DIAGNOSIS — I83018 Varicose veins of right lower extremity with ulcer other part of lower leg: Secondary | ICD-10-CM | POA: Diagnosis not present

## 2023-10-29 DIAGNOSIS — R600 Localized edema: Secondary | ICD-10-CM | POA: Diagnosis not present

## 2023-10-29 DIAGNOSIS — L97822 Non-pressure chronic ulcer of other part of left lower leg with fat layer exposed: Secondary | ICD-10-CM | POA: Diagnosis not present

## 2023-10-31 ENCOUNTER — Ambulatory Visit: Payer: Medicare PPO

## 2023-10-31 ENCOUNTER — Encounter: Payer: Self-pay | Admitting: Sports Medicine

## 2023-10-31 ENCOUNTER — Ambulatory Visit (INDEPENDENT_AMBULATORY_CARE_PROVIDER_SITE_OTHER): Payer: Medicare PPO | Admitting: Sports Medicine

## 2023-10-31 VITALS — BP 120/66 | HR 80 | Temp 97.3°F | Resp 16 | Ht <= 58 in | Wt 93.8 lb

## 2023-10-31 DIAGNOSIS — M810 Age-related osteoporosis without current pathological fracture: Secondary | ICD-10-CM

## 2023-10-31 DIAGNOSIS — F411 Generalized anxiety disorder: Secondary | ICD-10-CM | POA: Diagnosis not present

## 2023-10-31 DIAGNOSIS — T148XXA Other injury of unspecified body region, initial encounter: Secondary | ICD-10-CM | POA: Diagnosis not present

## 2023-10-31 DIAGNOSIS — F5101 Primary insomnia: Secondary | ICD-10-CM

## 2023-10-31 DIAGNOSIS — Z131 Encounter for screening for diabetes mellitus: Secondary | ICD-10-CM

## 2023-10-31 DIAGNOSIS — I10 Essential (primary) hypertension: Secondary | ICD-10-CM | POA: Diagnosis not present

## 2023-10-31 DIAGNOSIS — R5383 Other fatigue: Secondary | ICD-10-CM | POA: Diagnosis not present

## 2023-10-31 DIAGNOSIS — D509 Iron deficiency anemia, unspecified: Secondary | ICD-10-CM

## 2023-10-31 MED ORDER — TRAZODONE HCL 50 MG PO TABS: 50.0000 mg | ORAL_TABLET | Freq: Every day | ORAL | 2 refills | Status: DC

## 2023-10-31 MED ORDER — SERTRALINE HCL 50 MG PO TABS: 50.0000 mg | ORAL_TABLET | Freq: Every day | ORAL | 3 refills | Status: AC

## 2023-10-31 NOTE — Progress Notes (Signed)
 Careteam: Patient Care Team: Venita Sheffield, MD as PCP - General (Internal Medicine) Jake Bathe, MD as PCP - Cardiology (Cardiology) Donnetta Hail, MD as Consulting Physician (Rheumatology) Altheimer, Casimiro Needle, MD as Consulting Physician (Endocrinology) Tressie Stalker, MD as Consulting Physician (Neurosurgery) Odette Fraction, MD (Inactive) as Consulting Physician (Anesthesiology) Benard Rink, Jackquline Denmark, MD (Inactive) as Consulting Physician (Diagnostic Radiology) Olegario Shearer, MD as Referring Physician (Dermatology) Albin Felling, OD as Consulting Physician (Optometry) Felicity Coyer, MD as Referring Physician (Internal Medicine)  PLACE OF SERVICE:  [facility]  Advanced Directive information Does Patient Have a Medical Advance Directive?: No, Would patient like information on creating a medical advance directive?: No - Patient declined  Allergies  Allergen Reactions   Arava [Leflunomide] Diarrhea, Nausea Only and Other (See Comments)    Loss appetite   Penicillins Other (See Comments) and Rash    PATIENT HAS HAD A PCN REACTION WITH IMMEDIATE RASH, FACIAL/TONGUE/THROAT SWELLING, SOB, OR LIGHTHEADEDNESS WITH HYPOTENSION:  #  #  YES  #  #   Has patient had a PCN reaction causing severe rash involving mucus membranes or skin necrosis: no  Has patient had a PCN reaction that required hospitalization: no   Clindamycin Other (See Comments)    Trouble swallowing   Methotrexate Other (See Comments)   Penicillamine Other (See Comments)   Methotrexate Derivatives Other (See Comments)    Hair loss   Neurontin [Gabapentin] Nausea And Vomiting    Chief Complaint  Patient presents with   Follow-up    2 month follow up on medication Amitriptyline     Discussed the use of AI scribe software for clinical note transcription with the patient, who gave verbal consent to proceed.  History of Present Illness   Joan [name] is a 78 year old female with anemia and a  chronic wound who presents for follow-up   She has anemia and follows with hematology.   She has a chronic wound on her leg and follows up with a wound clinic weekly. The wound is improving, and she uses a boot to prevent leg movement.    She has rheumatoid arthritis and receives Enbrel injections. She takes prednisone 5 mg daily and Naprosyn, which she cuts in half. She also takes tramadol two to three times a day for lower back pain, which she rates as 6-7/10 before medication.   She has been on amitriptyline for years to help with sleep but has not tried other medications. She has difficulty falling asleep and feels tired often.  She feels anxious, especially about her mother, who is in a nursing home. She has a history of taking citalopram for mood but is not currently on it.  Her hypertension is well-controlled with losartan 50 mg. She used to take Lasix but no longer does. She drinks water throughout the day but does not measure her intake.  She reports feeling tired          Review of Systems:  Review of Systems  Constitutional:  Positive for malaise/fatigue. Negative for chills and fever.  HENT:  Negative for congestion and sore throat.   Eyes:  Negative for double vision.  Respiratory:  Negative for cough, sputum production and shortness of breath.   Cardiovascular:  Negative for chest pain, palpitations and leg swelling.  Gastrointestinal:  Negative for abdominal pain, heartburn and nausea.  Genitourinary:  Negative for dysuria, frequency and hematuria.  Musculoskeletal:  Positive for joint pain. Negative for myalgias.  Neurological:  Negative for dizziness, sensory change and focal weakness.   Negative unless indicated in HPI.   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 Lumbo-sacral anterolisthesis     Senile osteoporosis 04/01/03   Sprain of metatarsophalangeal (joint) of foot    Thyrotoxicosis    Thyrotoxicosis without mention of goiter or other cause, without mention of thyrotoxic crisis or storm    Unspecified vitamin D deficiency    Urinary frequency    Past Surgical History:  Procedure Laterality Date   COLONOSCOPY     COLONOSCOPY N/A 04/15/2015   Procedure: COLONOSCOPY;  Surgeon: Iva Boop, MD;  Location: [facility] ENDOSCOPY;  Service: Endoscopy;  Laterality: N/A;   DG FINGERS, MULTIPLE LT HAND ([facility] HX)     EYE SURGERY     FOOT SURGERY Bilateral 05/22/2014   Dr.Strom    LUMBAR DISC SURGERY  12/12/2017   Dr Lovell Sheehan   REMOVE GANGLION  1998   DR [facility]    RIGHT WRIST SUGERY FOR TENOSYNOVITIS  1997   DR SYPHER    TONSILLECTOMY AND ADENOIDECTOMY  1974   TUBAL LIGATION  1973   Social History:   reports that she has never smoked. She has never used smokeless tobacco. She reports that she does not drink alcohol and does not use drugs.  Family History  Problem Relation Age of Onset   Heart attack Father        age 44   Heart disease Father    Colon cancer Neg Hx    Esophageal cancer Neg Hx    Rectal cancer Neg Hx    Stomach cancer Neg Hx     Medications: Patient's Medications  New Prescriptions   SERTRALINE (ZOLOFT) 50  MG TABLET    Take 1 tablet (50 mg total) by mouth daily.   TRAZODONE (DESYREL) 50 MG TABLET    Take 1 tablet (50 mg total) by mouth at bedtime.  Previous Medications   ACETAMINOPHEN (TYLENOL) 650 MG CR TABLET    Take 1,300 mg by mouth at bedtime.   BIOTIN 1000 MCG TABLET    Take 1 tablet (1 mg total) by mouth 2 (two) times daily.   BUDESONIDE (ENTOCORT EC) 3 MG 24 HR CAPSULE    Take 3 capsules (9 mg total) by mouth daily.   CALCIUM CARB-CHOLECALCIFEROL (CALTRATE 600+D3) 600-800 MG-UNIT TABS    Take 1 tablet by mouth 2 (two) times daily.   CHOLECALCIFEROL (VITAMIN D3) 50 MCG (2000 UT) CAPSULE    Take 1 capsule (2,000 Units total) by mouth daily.   CYCLOSPORINE (RESTASIS) 0.05 % OPHTHALMIC EMULSION    Place 1 drop  into both eyes 2 (two) times daily.   DENOSUMAB (PROLIA) 60 MG/ML SOSY INJECTION    Inject 60 mg into the skin every 6 (six) months.   DIPHENOXYLATE-ATROPINE (LOMOTIL) 2.5-0.025 MG TABLET    Take 1-2 tablets by mouth every 8 (eight) hours as needed.   ETANERCEPT (ENBREL) 50 MG/ML INJECTION    Inject 50 mg into the skin once a week.   FLUTICASONE (FLONASE) 50 MCG/ACT NASAL SPRAY    Place 1 spray into both nostrils as needed for allergies or rhinitis.   FOLIC ACID (FOLVITE) 1 MG TABLET    Take 1 mg by mouth daily.   GLUCOSAMINE-CHONDROIT-VIT C-MN (GLUCOSAMINE CHONDR 500 COMPLEX PO)    Take 2 tablets by mouth every other day.   LOSARTAN (COZAAR) 50 MG TABLET    Take 1 tablet (50 mg total) by mouth daily.   MENTHOL, TOPICAL ANALGESIC, (ASPERCREME MAX ROLL-ON EX)    Apply topically as needed.   MULTIPLE VITAMIN (MULTIVITAMIN) TABLET    Take 1 tablet by mouth daily.   MUPIROCIN OINTMENT (BACTROBAN) 2 %    Apply 1 Application topically as needed.   NAPROXEN (NAPROSYN) 500 MG TABLET    Take 250 mg by mouth daily.   OMEGA-3 FATTY ACIDS (FISH OIL) 1000 MG CAPS    Take 1,000 mg by mouth daily.   OMEPRAZOLE (PRILOSEC) 40 MG CAPSULE    Take 40 mg by mouth as needed.   PREDNISONE (DELTASONE) 5 MG  TABLET    TAKE 1 TABLET BY MOUTH WITH BREAKFAST   PROPYLENE GLYCOL 0.6 % SOLN    Place 1 drop into both eyes 2 (two) times daily as needed (dry eyes).   TIROSINT 50 MCG CAPS    Take 1 tablet by mouth as directed. 5 times weekly. NOT ON SATURDAY & SUNDAY   TRAMADOL (ULTRAM) 50 MG TABLET    Take 1 tablet (50 mg total) by mouth 2 (two) times daily as needed.   TRETINOIN (RETIN-A) 0.025 % CREAM       UREA (CARMOL) 10 % CREAM    Apply topically as needed.   ZINC GLUCONATE 50 MG TABLET    Take by mouth as needed.  Modified Medications   No medications on file  Discontinued Medications   AMITRIPTYLINE (ELAVIL) 10 MG TABLET    TAKE 1 TABLET BY MOUTH ONCE DAILY AT BEDTIME FOR MUSCLE RELAXATION   CITALOPRAM (CELEXA) 10 MG TABLET    Take 1 tablet (10 mg total) by mouth daily.   FLUTICASONE (FLONASE) 50 MCG/ACT NASAL SPRAY    Place 1 spray into both nostrils daily for 14 days.   FUROSEMIDE (LASIX) 20 MG TABLET    Take 1 tablet (20 mg total) by mouth daily.   FUROSEMIDE (LASIX) 20 MG TABLET    Take 20 mg by mouth as needed.   MINOCYCLINE (MINOCIN) 100 MG CAPSULE    Take 100 mg by mouth 2 (two) times daily.   NAPROXEN (NAPROSYN) 500 MG TABLET    Take 1 tablet (500 mg total) by mouth in the morning and at bedtime.   OMEPRAZOLE (PRILOSEC) 40 MG CAPSULE    Take 1 capsule (40 mg total) by mouth daily.    Physical Exam: Vitals:   10/31/23 0844  BP: 120/66  Pulse: 80  Resp: 16  Temp: (!) 97.3 F (36.3 C)  SpO2: 95%  Weight: 93 lb 12.8 oz (42.5 kg)  Height: 4\' 8"  (1.422 m)   Body mass index is 21.03 kg/m. BP  Readings from Last 3 Encounters:  10/31/23 120/66  07/10/23 (!) 162/70  06/07/23 (!) 168/99   Wt Readings from Last 3 Encounters:  10/31/23 93 lb 12.8 oz (42.5 kg)  07/10/23 90 lb 11.2 oz (41.1 kg)  06/07/23 98 lb (44.5 kg)    Physical Exam Constitutional:      Appearance: Normal appearance.  HENT:     Head: Normocephalic and atraumatic.  Cardiovascular:     Rate and Rhythm: Normal  rate and regular rhythm.  Pulmonary:     Effort: Pulmonary effort is normal. No respiratory distress.     Breath sounds: Normal breath sounds. No wheezing.  Abdominal:     General: Bowel sounds are normal. There is no distension.     Tenderness: There is no abdominal tenderness. There is no guarding or rebound.     Comments:    Musculoskeletal:        General: No swelling.  Neurological:     Mental Status: She is alert. Mental status is at baseline.     Sensory: No sensory deficit.     Motor: No weakness.     Labs reviewed: Basic Metabolic Panel: Recent Labs    12/06/22 1003 03/20/23 0000 10/31/23 0952  NA 141 141 140  K 4.3 4.6 4.4  CL 104 106 105  CO2 28 27* 27  GLUCOSE 79  --  83  BUN 31*  --  32*  CREATININE 1.10* 1.0 1.07*  CALCIUM 8.8 8.7 8.5*  TSH  --  5.42  --    Liver Function Tests: Recent Labs    03/20/23 0000 10/31/23 0952  AST  --  13  ALT  --  12  BILITOT  --  0.3  PROT  --  6.3  ALBUMIN 3.7  --    No results for input(s): "LIPASE", "AMYLASE" in the last 8760 hours. No results for input(s): "AMMONIA" in the last 8760 hours. CBC: Recent Labs    01/09/23 0940 07/10/23 1503 10/31/23 0952  WBC 12.7* 13.7* 13.3*  NEUTROABS 9.4* 10.7* 10,321*  HGB 12.8 12.5 11.5*  HCT 39.0 38.1 35.3  MCV 97.0 96.9 99.4  PLT 296 342 270   Lipid Panel: No results for input(s): "CHOL", "HDL", "LDLCALC", "TRIG", "CHOLHDL", "LDLDIRECT" in the last 8760 hours. TSH: Recent Labs    03/20/23 0000  TSH 5.42   A1C: Lab Results  Component Value Date   HGBA1C 5.6 10/31/2023    Assessment and Plan    Chronic Wound Patient has been following up with a wound clinic weekly for a chronic wound. Reports that the wound is improving. -Continue weekly follow-ups with wound clinic.  Anemia Patient has been seeing a hematologist    will check cbc  Hypertension Blood pressure is well-controlled on Losartan 50mg  daily. -Continue Losartan 50mg  daily.  Rheumatoid  Arthritis Patient is on Enbrel injections and Prednisone 5mg  daily. Reports pain in the lower back. -Continue current medications.  Insomnia Patient has been on Amitriptyline for years for sleep. Discussed potential   side effects   -will wean amitriptyline will start trazodone    Hyperlipidemia  will check lipid panel at next visit     Osteoporosis Patient receives Prolia injections for osteoporosis by endocrinology    Anxiety Patient reports feeling worried and anxious, particularly about her mother who is in a nursing home. -Start Zoloft for mood and sleep.         Return in about 4 weeks (around 11/28/2023), or MWV, for schedule in  4 weeks  schedule morning appt for fasting lab.:

## 2023-10-31 NOTE — Patient Instructions (Signed)
 Take 1/2 tab elavil for 2 weeks and then take 1/2 tab every other day and stop  Start taking trazodone 50 mg

## 2023-11-01 ENCOUNTER — Encounter: Payer: Self-pay | Admitting: Sports Medicine

## 2023-11-01 LAB — CBC WITH DIFFERENTIAL/PLATELET
Absolute Lymphocytes: 1716 {cells}/uL (ref 850–3900)
Absolute Monocytes: 1197 {cells}/uL — ABNORMAL HIGH (ref 200–950)
Basophils Absolute: 27 {cells}/uL (ref 0–200)
Basophils Relative: 0.2 %
Eosinophils Absolute: 40 {cells}/uL (ref 15–500)
Eosinophils Relative: 0.3 %
HCT: 35.3 % (ref 35.0–45.0)
Hemoglobin: 11.5 g/dL — ABNORMAL LOW (ref 11.7–15.5)
MCH: 32.4 pg (ref 27.0–33.0)
MCHC: 32.6 g/dL (ref 32.0–36.0)
MCV: 99.4 fL (ref 80.0–100.0)
MPV: 10.6 fL (ref 7.5–12.5)
Monocytes Relative: 9 %
Neutro Abs: 10321 {cells}/uL — ABNORMAL HIGH (ref 1500–7800)
Neutrophils Relative %: 77.6 %
Platelets: 270 10*3/uL (ref 140–400)
RBC: 3.55 10*6/uL — ABNORMAL LOW (ref 3.80–5.10)
RDW: 12.1 % (ref 11.0–15.0)
Total Lymphocyte: 12.9 %
WBC: 13.3 10*3/uL — ABNORMAL HIGH (ref 3.8–10.8)

## 2023-11-01 LAB — COMPLETE METABOLIC PANEL WITH GFR
AG Ratio: 1.3 (calc) (ref 1.0–2.5)
ALT: 12 U/L (ref 6–29)
AST: 13 U/L (ref 10–35)
Albumin: 3.6 g/dL (ref 3.6–5.1)
Alkaline phosphatase (APISO): 45 U/L (ref 37–153)
BUN/Creatinine Ratio: 30 (calc) — ABNORMAL HIGH (ref 6–22)
BUN: 32 mg/dL — ABNORMAL HIGH (ref 7–25)
CO2: 27 mmol/L (ref 20–32)
Calcium: 8.5 mg/dL — ABNORMAL LOW (ref 8.6–10.4)
Chloride: 105 mmol/L (ref 98–110)
Creat: 1.07 mg/dL — ABNORMAL HIGH (ref 0.60–1.00)
Globulin: 2.7 g/dL (ref 1.9–3.7)
Glucose, Bld: 83 mg/dL (ref 65–139)
Potassium: 4.4 mmol/L (ref 3.5–5.3)
Sodium: 140 mmol/L (ref 135–146)
Total Bilirubin: 0.3 mg/dL (ref 0.2–1.2)
Total Protein: 6.3 g/dL (ref 6.1–8.1)
eGFR: 54 mL/min/{1.73_m2} — ABNORMAL LOW (ref >=60)

## 2023-11-01 LAB — HEMOGLOBIN A1C
Hgb A1c MFr Bld: 5.6 %{Hb} (ref <5.7)
Mean Plasma Glucose: 114 mg/dL
eAG (mmol/L): 6.3 mmol/L

## 2023-11-01 LAB — IRON,TIBC AND FERRITIN PANEL
%SAT: 26 % (ref 16–45)
Ferritin: 65 ng/mL (ref 16–288)
Iron: 74 ng/mL (ref 45–288)
TIBC: 286 ug/dL (ref 250–450)

## 2023-11-01 LAB — VITAMIN D 25 HYDROXY (VIT D DEFICIENCY, FRACTURES): Vit D, 25-Hydroxy: 57 ng/mL (ref 30–100)

## 2023-11-05 ENCOUNTER — Telehealth: Payer: Self-pay | Admitting: *Deleted

## 2023-11-05 DIAGNOSIS — M810 Age-related osteoporosis without current pathological fracture: Secondary | ICD-10-CM | POA: Diagnosis not present

## 2023-11-05 DIAGNOSIS — M1991 Primary osteoarthritis, unspecified site: Secondary | ICD-10-CM | POA: Diagnosis not present

## 2023-11-05 DIAGNOSIS — M7022 Olecranon bursitis, left elbow: Secondary | ICD-10-CM | POA: Diagnosis not present

## 2023-11-05 DIAGNOSIS — D508 Other iron deficiency anemias: Secondary | ICD-10-CM | POA: Diagnosis not present

## 2023-11-05 DIAGNOSIS — M0579 Rheumatoid arthritis with rheumatoid factor of multiple sites without organ or systems involvement: Secondary | ICD-10-CM | POA: Diagnosis not present

## 2023-11-05 DIAGNOSIS — N1831 Chronic kidney disease, stage 3a: Secondary | ICD-10-CM | POA: Diagnosis not present

## 2023-11-05 DIAGNOSIS — M21241 Flexion deformity, right finger joints: Secondary | ICD-10-CM | POA: Diagnosis not present

## 2023-11-05 DIAGNOSIS — R5383 Other fatigue: Secondary | ICD-10-CM | POA: Diagnosis not present

## 2023-11-05 DIAGNOSIS — M5136 Other intervertebral disc degeneration, lumbar region with discogenic back pain only: Secondary | ICD-10-CM | POA: Diagnosis not present

## 2023-11-05 NOTE — Telephone Encounter (Signed)
 Patient called and stated that she had labwork done on 10/31/2023 and would like to know the results.   Please Advise.

## 2023-11-06 DIAGNOSIS — L97815 Non-pressure chronic ulcer of other part of right lower leg with muscle involvement without evidence of necrosis: Secondary | ICD-10-CM | POA: Diagnosis not present

## 2023-11-06 DIAGNOSIS — R609 Edema, unspecified: Secondary | ICD-10-CM | POA: Diagnosis not present

## 2023-11-06 DIAGNOSIS — I83891 Varicose veins of right lower extremities with other complications: Secondary | ICD-10-CM | POA: Diagnosis not present

## 2023-11-06 DIAGNOSIS — I872 Venous insufficiency (chronic) (peripheral): Secondary | ICD-10-CM | POA: Diagnosis not present

## 2023-11-06 DIAGNOSIS — L97812 Non-pressure chronic ulcer of other part of right lower leg with fat layer exposed: Secondary | ICD-10-CM | POA: Diagnosis not present

## 2023-11-06 DIAGNOSIS — I83018 Varicose veins of right lower extremity with ulcer other part of lower leg: Secondary | ICD-10-CM | POA: Diagnosis not present

## 2023-11-13 DIAGNOSIS — L97815 Non-pressure chronic ulcer of other part of right lower leg with muscle involvement without evidence of necrosis: Secondary | ICD-10-CM | POA: Diagnosis not present

## 2023-11-13 DIAGNOSIS — I872 Venous insufficiency (chronic) (peripheral): Secondary | ICD-10-CM | POA: Diagnosis not present

## 2023-11-13 DIAGNOSIS — I83018 Varicose veins of right lower extremity with ulcer other part of lower leg: Secondary | ICD-10-CM | POA: Diagnosis not present

## 2023-11-15 DIAGNOSIS — E890 Postprocedural hypothyroidism: Secondary | ICD-10-CM | POA: Diagnosis not present

## 2023-11-19 DIAGNOSIS — I872 Venous insufficiency (chronic) (peripheral): Secondary | ICD-10-CM | POA: Diagnosis not present

## 2023-11-19 DIAGNOSIS — L97822 Non-pressure chronic ulcer of other part of left lower leg with fat layer exposed: Secondary | ICD-10-CM | POA: Diagnosis not present

## 2023-11-19 DIAGNOSIS — I83018 Varicose veins of right lower extremity with ulcer other part of lower leg: Secondary | ICD-10-CM | POA: Diagnosis not present

## 2023-11-19 DIAGNOSIS — L97815 Non-pressure chronic ulcer of other part of right lower leg with muscle involvement without evidence of necrosis: Secondary | ICD-10-CM | POA: Diagnosis not present

## 2023-11-21 ENCOUNTER — Other Ambulatory Visit: Payer: Self-pay | Admitting: Sports Medicine

## 2023-11-23 DIAGNOSIS — E041 Nontoxic single thyroid nodule: Secondary | ICD-10-CM | POA: Diagnosis not present

## 2023-11-23 DIAGNOSIS — E890 Postprocedural hypothyroidism: Secondary | ICD-10-CM | POA: Diagnosis not present

## 2023-11-26 DIAGNOSIS — L97815 Non-pressure chronic ulcer of other part of right lower leg with muscle involvement without evidence of necrosis: Secondary | ICD-10-CM | POA: Diagnosis not present

## 2023-11-26 DIAGNOSIS — I83018 Varicose veins of right lower extremity with ulcer other part of lower leg: Secondary | ICD-10-CM | POA: Diagnosis not present

## 2023-11-26 DIAGNOSIS — I872 Venous insufficiency (chronic) (peripheral): Secondary | ICD-10-CM | POA: Diagnosis not present

## 2023-11-27 ENCOUNTER — Other Ambulatory Visit: Payer: Self-pay | Admitting: Sports Medicine

## 2023-11-27 NOTE — Telephone Encounter (Signed)
Requested medication is not on active medication list  

## 2023-11-28 ENCOUNTER — Ambulatory Visit: Payer: Medicare PPO | Admitting: Sports Medicine

## 2023-11-28 ENCOUNTER — Encounter: Payer: Self-pay | Admitting: Sports Medicine

## 2023-11-28 ENCOUNTER — Ambulatory Visit
Admission: RE | Admit: 2023-11-28 | Discharge: 2023-11-28 | Disposition: A | Discharge status: Home / Self Care | Payer: Medicare PPO | Source: Ambulatory Visit | Attending: Sports Medicine | Admitting: Sports Medicine

## 2023-11-28 VITALS — BP 128/80 | HR 68 | Temp 96.7°F | Resp 16 | Ht <= 58 in | Wt 91.4 lb

## 2023-11-28 DIAGNOSIS — F411 Generalized anxiety disorder: Secondary | ICD-10-CM

## 2023-11-28 DIAGNOSIS — Z1231 Encounter for screening mammogram for malignant neoplasm of breast: Secondary | ICD-10-CM

## 2023-11-28 DIAGNOSIS — F5101 Primary insomnia: Secondary | ICD-10-CM

## 2023-11-28 DIAGNOSIS — E782 Mixed hyperlipidemia: Secondary | ICD-10-CM | POA: Diagnosis not present

## 2023-11-28 DIAGNOSIS — T148XXA Other injury of unspecified body region, initial encounter: Secondary | ICD-10-CM | POA: Diagnosis not present

## 2023-11-28 MED ORDER — MIRTAZAPINE 7.5 MG PO TABS: 7.5000 mg | ORAL_TABLET | Freq: Every day | ORAL | 2 refills | Status: AC

## 2023-11-28 NOTE — Patient Instructions (Signed)
 Take amitriptyline  1/2 tab for a week and then 1/2 tab every other day for a week and stop it.  You can start taking rameron 7.5 mg  starting today every night   Take melatonin  5 mg over the counter to help with sleep

## 2023-11-28 NOTE — Progress Notes (Signed)
 Careteam: Patient Care Team: Venita Sheffield, MD as PCP - General (Internal Medicine) Jake Bathe, MD as PCP - Cardiology (Cardiology) Donnetta Hail, MD as Consulting Physician (Rheumatology) Altheimer, Casimiro Needle, MD as Consulting Physician (Endocrinology) Tressie Stalker, MD as Consulting Physician (Neurosurgery) Odette Fraction, MD (Inactive) as Consulting Physician (Anesthesiology) Benard Rink, Jackquline Denmark, MD (Inactive) as Consulting Physician (Diagnostic Radiology) Olegario Shearer, MD as Referring Physician (Dermatology) Albin Felling, OD as Consulting Physician (Optometry) Felicity Coyer, MD as Referring Physician (Internal Medicine)  PLACE OF SERVICE:  [facility]  Advanced Directive information    Allergies  Allergen Reactions   Arava [Leflunomide] Diarrhea, Nausea Only and Other (See Comments)    Loss appetite   Penicillins Other (See Comments) and Rash    PATIENT HAS HAD A PCN REACTION WITH IMMEDIATE RASH, FACIAL/TONGUE/THROAT SWELLING, SOB, OR LIGHTHEADEDNESS WITH HYPOTENSION:  #  #  YES  #  #   Has patient had a PCN reaction causing severe rash involving mucus membranes or skin necrosis: no  Has patient had a PCN reaction that required hospitalization: no   Clindamycin Other (See Comments)    Trouble swallowing   Methotrexate Other (See Comments)   Penicillamine Other (See Comments)   Methotrexate Derivatives Other (See Comments)    Hair loss   Neurontin [Gabapentin] Nausea And Vomiting    Chief Complaint  Patient presents with   Anxiety    4 week follow up on anxiety.    Labs     Fasting labs      Discussed the use of AI scribe software for clinical note transcription with the patient, who gave verbal consent to proceed.  History of Present Illness     Joan [name] is a 78 year old female who presents for medication management of anxiety and sleep disturbances.  She stopped taking trazodone after three to four days due to dizziness. Pt  reported that she feels dizzy when standing up. States she is not drinking enough water.  She continues to take amitriptyline. She is not taking zoloft consistently   Denies chest pain, palpitations, SOB, abdominal pain, nausea, vomiting.  She has not been walking as much as she used to, noting she previously walked a mile regularly.  She is concerned about her lab results, particularly regarding diabetes, but is reassured that she is not diabetic.  She has chronic wound and follows with wound clinic.  No blood in urine or stool. No current cough, sore throat, pain with urination, or burning sensation during urination. No current chest pain or shortness of breath at rest.         Review of Systems:  Review of Systems  Constitutional:  Negative for chills and fever.  HENT:  Negative for congestion and sore throat.   Respiratory:  Negative for cough, sputum production and shortness of breath.   Cardiovascular:  Negative for chest pain, palpitations and leg swelling.  Gastrointestinal:  Negative for abdominal pain, heartburn and nausea.  Genitourinary:  Negative for dysuria, frequency and hematuria.  Musculoskeletal:  Negative for falls and myalgias.  Neurological:  Negative for dizziness, sensory change and focal weakness.  Psychiatric/Behavioral:  The patient is nervous/anxious and has insomnia.    Negative unless indicated in HPI.   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 Lumbo-sacral anterolisthesis     Senile osteoporosis 04/01/03   Sprain of metatarsophalangeal (joint) of foot    Thyrotoxicosis    Thyrotoxicosis without mention of goiter or other cause, without mention of thyrotoxic crisis or storm    Unspecified vitamin D deficiency    Urinary frequency    Past Surgical History:  Procedure Laterality Date   COLONOSCOPY     COLONOSCOPY N/A 04/15/2015   Procedure: COLONOSCOPY;  Surgeon: Iva Boop, MD;  Location: [facility] ENDOSCOPY;  Service: Endoscopy;  Laterality: N/A;   DG FINGERS, MULTIPLE LT HAND ([facility] HX)     EYE SURGERY     FOOT SURGERY Bilateral 05/22/2014   Dr.Strom    LUMBAR DISC SURGERY  12/12/2017   Dr Lovell Sheehan   REMOVE GANGLION  1998   DR [facility]    RIGHT WRIST SUGERY FOR TENOSYNOVITIS  1997   DR SYPHER    TONSILLECTOMY AND ADENOIDECTOMY  1974   TUBAL LIGATION  1973   Social History:   reports that she has never smoked. She has never used smokeless tobacco. She reports that she does not drink alcohol and does not use drugs.  Family History  Problem Relation Age of Onset   Heart attack Father        age 38   Heart disease Father    Colon cancer Neg Hx    Esophageal cancer Neg Hx    Rectal cancer Neg Hx    Stomach cancer Neg Hx    Breast cancer Neg Hx    BRCA 1/2 Neg Hx     Medications: Patient's Medications  New Prescriptions   MIRTAZAPINE (REMERON) 7.5 MG TABLET    Take 1 tablet (7.5 mg total) by mouth at bedtime.  Previous Medications   ACETAMINOPHEN (TYLENOL) 650 MG CR TABLET    Take 1,300 mg by mouth at bedtime.   BIOTIN 1000 MCG TABLET    Take 1 tablet (1 mg total) by mouth 2 (two) times daily.    BUDESONIDE (ENTOCORT EC) 3 MG 24 HR CAPSULE    Take 3 capsules (9 mg total) by mouth daily.   CALCIUM CARB-CHOLECALCIFEROL (CALTRATE 600+D3) 600-800 MG-UNIT TABS    Take 1 tablet by mouth 2 (two) times daily.   CHOLECALCIFEROL (VITAMIN D3) 50 MCG (2000 UT) CAPSULE    Take 1 capsule (2,000 Units total) by mouth daily.   CYCLOSPORINE (RESTASIS) 0.05 % OPHTHALMIC EMULSION    Place 1 drop into both eyes 2 (two) times daily.   DENOSUMAB (PROLIA) 60 MG/ML SOSY INJECTION    Inject 60 mg into the skin every 6 (six) months.   DIPHENOXYLATE-ATROPINE (LOMOTIL) 2.5-0.025 MG TABLET    Take 1-2 tablets by mouth every 8 (eight) hours as needed.   ETANERCEPT (ENBREL) 50 MG/ML INJECTION    Inject 50 mg  into the skin once a week.   FLUTICASONE (FLONASE) 50 MCG/ACT NASAL SPRAY    Place 1 spray into both nostrils as needed for allergies or rhinitis.   FOLIC ACID (FOLVITE) 1 MG TABLET    Take 1 mg by mouth daily.   GLUCOSAMINE-CHONDROIT-VIT C-MN (GLUCOSAMINE CHONDR 500 COMPLEX PO)    Take 2 tablets by mouth every other day.   LOSARTAN (COZAAR) 50 MG TABLET    Take 1 tablet (50 mg total) by mouth daily.   MENTHOL, TOPICAL ANALGESIC, (ASPERCREME MAX ROLL-ON EX)    Apply topically as needed.   MULTIPLE VITAMIN (MULTIVITAMIN) TABLET    Take 1 tablet by mouth daily.   MUPIROCIN OINTMENT (BACTROBAN) 2 %    Apply 1 Application topically as needed.   NAPROXEN (NAPROSYN) 500 MG TABLET    Take 250 mg by mouth daily.   OMEGA-3 FATTY ACIDS (FISH OIL) 1000 MG CAPS    Take 1,000 mg by mouth daily.   OMEPRAZOLE (PRILOSEC) 40 MG CAPSULE    Take 40 mg by mouth as needed.   PREDNISONE (DELTASONE) 5 MG TABLET    TAKE 1 TABLET BY MOUTH WITH BREAKFAST   PROPYLENE GLYCOL 0.6 % SOLN    Place 1 drop into both eyes 2 (two) times daily as needed (dry eyes).   SERTRALINE (ZOLOFT) 50 MG TABLET    Take 1 tablet (50 mg total) by mouth daily.   TIROSINT 50 MCG CAPS    Take 1 tablet by mouth as directed. 5 times weekly. NOT ON SATURDAY & SUNDAY    TRAMADOL (ULTRAM) 50 MG TABLET    Take 1 tablet (50 mg total) by mouth 2 (two) times daily as needed.   TRETINOIN (RETIN-A) 0.025 % CREAM       UREA (CARMOL) 10 % CREAM    Apply topically as needed.   ZINC GLUCONATE 50 MG TABLET    Take by mouth as needed.  Modified Medications   No medications on file  Discontinued Medications   TRAZODONE (DESYREL) 50 MG TABLET    Take 1 tablet (50 mg total) by mouth at bedtime.    Physical Exam: Vitals:   11/28/23 0835  BP: 128/80  Pulse: 68  Resp: 16  Temp: (!) 96.7 F (35.9 C)  SpO2: 96%  Weight: 91 lb 6.4 oz (41.5 kg)  Height: 4\' 8"  (1.422 m)   Body mass index is 20.49 kg/m. BP Readings from Last 3 Encounters:  11/28/23 128/80  10/31/23 120/66  07/10/23 (!) 162/70   Wt Readings from Last 3 Encounters:  11/28/23 91 lb 6.4 oz (41.5 kg)  10/31/23 93 lb 12.8 oz (42.5 kg)  07/10/23 90 lb 11.2 oz (41.1 kg)    Physical Exam Constitutional:      Appearance: Normal appearance.  HENT:     Head: Normocephalic and atraumatic.  Cardiovascular:     Rate and Rhythm: Normal rate and regular rhythm.  Pulmonary:     Effort: Pulmonary effort is normal. No respiratory distress.     Breath sounds: Normal breath sounds. No wheezing.  Abdominal:     General: Bowel sounds are normal. There is no distension.     Tenderness: There is no abdominal tenderness. There is no guarding or rebound.     Comments:    Musculoskeletal:        General: No swelling or tenderness.  Neurological:     Mental Status: She is alert. Mental status is at baseline.     Sensory:  No sensory deficit.     Motor: No weakness.     Labs reviewed: Basic Metabolic Panel: Recent Labs    12/06/22 1003 03/20/23 0000 10/31/23 0952  NA 141 141 140  K 4.3 4.6 4.4  CL 104 106 105  CO2 28 27* 27  GLUCOSE 79  --  83  BUN 31*  --  32*  CREATININE 1.10* 1.0 1.07*  CALCIUM 8.8 8.7 8.5*  TSH  --  5.42  --    Liver Function Tests: Recent Labs    03/20/23 0000  10/31/23 0952  AST  --  13  ALT  --  12  BILITOT  --  0.3  PROT  --  6.3  ALBUMIN 3.7  --    No results for input(s): "LIPASE", "AMYLASE" in the last 8760 hours. No results for input(s): "AMMONIA" in the last 8760 hours. CBC: Recent Labs    01/09/23 0940 07/10/23 1503 10/31/23 0952  WBC 12.7* 13.7* 13.3*  NEUTROABS 9.4* 10.7* 10,321*  HGB 12.8 12.5 11.5*  HCT 39.0 38.1 35.3  MCV 97.0 96.9 99.4  PLT 296 342 270   Lipid Panel: No results for input(s): "CHOL", "HDL", "LDLCALC", "TRIG", "CHOLHDL", "LDLDIRECT" in the last 8760 hours. TSH: Recent Labs    03/20/23 0000  TSH 5.42   A1C: Lab Results  Component Value Date   HGBA1C 5.6 10/31/2023    Assessment and Plan    Anxiety Anxiety score increased to 11.   Instructed patient to take zoloft consistently  - Continue Zoloft for anxiety management.  Insomnia Pt reported dizziness with trazodone Informed patient about the side effects of amitriptyline - Taper off amitriptyline by taking half a tablet for a week, then half a tablet every other day for another week, and then stop. - Start Remeron 7.5 mg at night  - Consider melatonin as an over-the-counter option for sleep.     Chronic Wound Slow-healing leg wound. Follow up with wound clinic       HLD  Will check lipid panel       Return in about 2 months (around 01/28/2024), or schedule for mwv.:   Manvir Prabhu

## 2023-11-29 ENCOUNTER — Ambulatory Visit: Payer: Self-pay

## 2023-11-29 LAB — LIPID PANEL
Cholesterol: 196 mg/dL (ref <200)
HDL: 83 mg/dL (ref >=50)
LDL Cholesterol (Calc): 90 mg/dL
Non-HDL Cholesterol (Calc): 113 mg/dL (ref <130)
Total CHOL/HDL Ratio: 2.4 (calc) (ref <5.0)
Triglycerides: 132 mg/dL (ref <150)

## 2023-11-29 NOTE — Telephone Encounter (Signed)
 1st attempt, left voicemail to return call.   Copied from CRM [id number]. Topic: Clinical - Lab/Test Results >> Nov 29, 2023 10:08 AM Antony Haste wrote: Reason for CRM: The patient wanted to review her lab results. Advised the update left by her PCP verbatim, she still has additional questions.

## 2023-11-29 NOTE — Telephone Encounter (Signed)
 Copied from CRM [id number]. Topic: Clinical - Lab/Test Results >> Nov 29, 2023 10:11 AM Antony Haste wrote: Reason for CRM:  The patient wanted to review her lab results. Advised the update left by her PCP verbatim, she still has additional questions. Reason for Disposition  Health Information question, no triage required and triager able to answer question  Answer Assessment - Initial Assessment Questions 1. REASON FOR CALL or QUESTION: "What is your reason for calling today?" or "How can I best help you?" or "What question do you have that I can help answer?"     Pt called to inquiry about lab work /cholesterol levels.  Informed patient that per PCP instructions were "Plz call the patient and inform that blood work looks good.:  patient verbalized understanding,.  Protocols used: Information Only Call - No Triage-A-AH

## 2023-11-29 NOTE — Telephone Encounter (Signed)
 Spoke with patient, patient asked was there any dietary changes she needs to make and I advised that the provider did not give a directive to make any dietary changes as the lab work looks good and she can continue doing whatever she is doing. Patient verbalized understanding

## 2023-11-29 NOTE — Telephone Encounter (Signed)
 Duplicate crm

## 2023-12-03 DIAGNOSIS — L97815 Non-pressure chronic ulcer of other part of right lower leg with muscle involvement without evidence of necrosis: Secondary | ICD-10-CM | POA: Diagnosis not present

## 2023-12-03 DIAGNOSIS — I83018 Varicose veins of right lower extremity with ulcer other part of lower leg: Secondary | ICD-10-CM | POA: Diagnosis not present

## 2023-12-03 DIAGNOSIS — I872 Venous insufficiency (chronic) (peripheral): Secondary | ICD-10-CM | POA: Diagnosis not present

## 2023-12-04 ENCOUNTER — Encounter: Payer: Medicare PPO | Admitting: Family

## 2023-12-04 DIAGNOSIS — M4317 Spondylolisthesis, lumbosacral region: Secondary | ICD-10-CM | POA: Diagnosis not present

## 2023-12-07 DIAGNOSIS — R059 Cough, unspecified: Secondary | ICD-10-CM | POA: Diagnosis not present

## 2023-12-07 DIAGNOSIS — R0981 Nasal congestion: Secondary | ICD-10-CM | POA: Diagnosis not present

## 2023-12-10 DIAGNOSIS — Z7952 Long term (current) use of systemic steroids: Secondary | ICD-10-CM | POA: Diagnosis not present

## 2023-12-10 DIAGNOSIS — L97812 Non-pressure chronic ulcer of other part of right lower leg with fat layer exposed: Secondary | ICD-10-CM | POA: Diagnosis not present

## 2023-12-10 DIAGNOSIS — L97819 Non-pressure chronic ulcer of other part of right lower leg with unspecified severity: Secondary | ICD-10-CM | POA: Diagnosis not present

## 2023-12-10 DIAGNOSIS — I83018 Varicose veins of right lower extremity with ulcer other part of lower leg: Secondary | ICD-10-CM | POA: Diagnosis not present

## 2023-12-10 DIAGNOSIS — I10 Essential (primary) hypertension: Secondary | ICD-10-CM | POA: Diagnosis not present

## 2023-12-10 DIAGNOSIS — K219 Gastro-esophageal reflux disease without esophagitis: Secondary | ICD-10-CM | POA: Diagnosis not present

## 2023-12-10 DIAGNOSIS — I87311 Chronic venous hypertension (idiopathic) with ulcer of right lower extremity: Secondary | ICD-10-CM | POA: Diagnosis not present

## 2023-12-10 DIAGNOSIS — I872 Venous insufficiency (chronic) (peripheral): Secondary | ICD-10-CM | POA: Diagnosis not present

## 2023-12-10 DIAGNOSIS — I340 Nonrheumatic mitral (valve) insufficiency: Secondary | ICD-10-CM | POA: Diagnosis not present

## 2023-12-10 DIAGNOSIS — E039 Hypothyroidism, unspecified: Secondary | ICD-10-CM | POA: Diagnosis not present

## 2023-12-10 DIAGNOSIS — L84 Corns and callosities: Secondary | ICD-10-CM | POA: Diagnosis not present

## 2023-12-10 DIAGNOSIS — L97815 Non-pressure chronic ulcer of other part of right lower leg with muscle involvement without evidence of necrosis: Secondary | ICD-10-CM | POA: Diagnosis not present

## 2023-12-13 DIAGNOSIS — Z9181 History of falling: Secondary | ICD-10-CM | POA: Diagnosis not present

## 2023-12-13 DIAGNOSIS — Z1339 Encounter for screening examination for other mental health and behavioral disorders: Secondary | ICD-10-CM | POA: Diagnosis not present

## 2023-12-13 DIAGNOSIS — Z1331 Encounter for screening for depression: Secondary | ICD-10-CM | POA: Diagnosis not present

## 2023-12-13 DIAGNOSIS — J029 Acute pharyngitis, unspecified: Secondary | ICD-10-CM | POA: Diagnosis not present

## 2023-12-17 DIAGNOSIS — I340 Nonrheumatic mitral (valve) insufficiency: Secondary | ICD-10-CM | POA: Diagnosis not present

## 2023-12-17 DIAGNOSIS — I87311 Chronic venous hypertension (idiopathic) with ulcer of right lower extremity: Secondary | ICD-10-CM | POA: Diagnosis not present

## 2023-12-17 DIAGNOSIS — L97812 Non-pressure chronic ulcer of other part of right lower leg with fat layer exposed: Secondary | ICD-10-CM | POA: Diagnosis not present

## 2023-12-17 DIAGNOSIS — K219 Gastro-esophageal reflux disease without esophagitis: Secondary | ICD-10-CM | POA: Diagnosis not present

## 2023-12-17 DIAGNOSIS — L97815 Non-pressure chronic ulcer of other part of right lower leg with muscle involvement without evidence of necrosis: Secondary | ICD-10-CM | POA: Diagnosis not present

## 2023-12-17 DIAGNOSIS — Z7952 Long term (current) use of systemic steroids: Secondary | ICD-10-CM | POA: Diagnosis not present

## 2023-12-17 DIAGNOSIS — I83018 Varicose veins of right lower extremity with ulcer other part of lower leg: Secondary | ICD-10-CM | POA: Diagnosis not present

## 2023-12-17 DIAGNOSIS — E039 Hypothyroidism, unspecified: Secondary | ICD-10-CM | POA: Diagnosis not present

## 2023-12-17 DIAGNOSIS — L97819 Non-pressure chronic ulcer of other part of right lower leg with unspecified severity: Secondary | ICD-10-CM | POA: Diagnosis not present

## 2023-12-17 DIAGNOSIS — I10 Essential (primary) hypertension: Secondary | ICD-10-CM | POA: Diagnosis not present

## 2023-12-17 DIAGNOSIS — I872 Venous insufficiency (chronic) (peripheral): Secondary | ICD-10-CM | POA: Diagnosis not present

## 2023-12-24 DIAGNOSIS — I10 Essential (primary) hypertension: Secondary | ICD-10-CM | POA: Diagnosis not present

## 2023-12-24 DIAGNOSIS — L97819 Non-pressure chronic ulcer of other part of right lower leg with unspecified severity: Secondary | ICD-10-CM | POA: Diagnosis not present

## 2023-12-24 DIAGNOSIS — I340 Nonrheumatic mitral (valve) insufficiency: Secondary | ICD-10-CM | POA: Diagnosis not present

## 2023-12-24 DIAGNOSIS — E039 Hypothyroidism, unspecified: Secondary | ICD-10-CM | POA: Diagnosis not present

## 2023-12-24 DIAGNOSIS — I87311 Chronic venous hypertension (idiopathic) with ulcer of right lower extremity: Secondary | ICD-10-CM | POA: Diagnosis not present

## 2023-12-24 DIAGNOSIS — K219 Gastro-esophageal reflux disease without esophagitis: Secondary | ICD-10-CM | POA: Diagnosis not present

## 2023-12-24 DIAGNOSIS — L97815 Non-pressure chronic ulcer of other part of right lower leg with muscle involvement without evidence of necrosis: Secondary | ICD-10-CM | POA: Diagnosis not present

## 2023-12-24 DIAGNOSIS — I83018 Varicose veins of right lower extremity with ulcer other part of lower leg: Secondary | ICD-10-CM | POA: Diagnosis not present

## 2023-12-24 DIAGNOSIS — I872 Venous insufficiency (chronic) (peripheral): Secondary | ICD-10-CM | POA: Diagnosis not present

## 2023-12-24 DIAGNOSIS — L97812 Non-pressure chronic ulcer of other part of right lower leg with fat layer exposed: Secondary | ICD-10-CM | POA: Diagnosis not present

## 2023-12-24 DIAGNOSIS — Z7952 Long term (current) use of systemic steroids: Secondary | ICD-10-CM | POA: Diagnosis not present

## 2023-12-25 ENCOUNTER — Other Ambulatory Visit: Payer: Self-pay | Admitting: Cardiology

## 2023-12-31 DIAGNOSIS — L97812 Non-pressure chronic ulcer of other part of right lower leg with fat layer exposed: Secondary | ICD-10-CM | POA: Diagnosis not present

## 2023-12-31 DIAGNOSIS — K219 Gastro-esophageal reflux disease without esophagitis: Secondary | ICD-10-CM | POA: Diagnosis not present

## 2023-12-31 DIAGNOSIS — Z7952 Long term (current) use of systemic steroids: Secondary | ICD-10-CM | POA: Diagnosis not present

## 2023-12-31 DIAGNOSIS — E039 Hypothyroidism, unspecified: Secondary | ICD-10-CM | POA: Diagnosis not present

## 2023-12-31 DIAGNOSIS — L97819 Non-pressure chronic ulcer of other part of right lower leg with unspecified severity: Secondary | ICD-10-CM | POA: Diagnosis not present

## 2023-12-31 DIAGNOSIS — L97815 Non-pressure chronic ulcer of other part of right lower leg with muscle involvement without evidence of necrosis: Secondary | ICD-10-CM | POA: Diagnosis not present

## 2023-12-31 DIAGNOSIS — I340 Nonrheumatic mitral (valve) insufficiency: Secondary | ICD-10-CM | POA: Diagnosis not present

## 2023-12-31 DIAGNOSIS — I83018 Varicose veins of right lower extremity with ulcer other part of lower leg: Secondary | ICD-10-CM | POA: Diagnosis not present

## 2023-12-31 DIAGNOSIS — I10 Essential (primary) hypertension: Secondary | ICD-10-CM | POA: Diagnosis not present

## 2023-12-31 DIAGNOSIS — I872 Venous insufficiency (chronic) (peripheral): Secondary | ICD-10-CM | POA: Diagnosis not present

## 2023-12-31 DIAGNOSIS — I87311 Chronic venous hypertension (idiopathic) with ulcer of right lower extremity: Secondary | ICD-10-CM | POA: Diagnosis not present

## 2024-01-01 ENCOUNTER — Other Ambulatory Visit: Payer: Self-pay | Admitting: Cardiology

## 2024-01-07 DIAGNOSIS — M069 Rheumatoid arthritis, unspecified: Secondary | ICD-10-CM | POA: Diagnosis not present

## 2024-01-07 DIAGNOSIS — L97815 Non-pressure chronic ulcer of other part of right lower leg with muscle involvement without evidence of necrosis: Secondary | ICD-10-CM | POA: Diagnosis not present

## 2024-01-07 DIAGNOSIS — Z7952 Long term (current) use of systemic steroids: Secondary | ICD-10-CM | POA: Diagnosis not present

## 2024-01-07 DIAGNOSIS — Z79899 Other long term (current) drug therapy: Secondary | ICD-10-CM | POA: Diagnosis not present

## 2024-01-07 DIAGNOSIS — E039 Hypothyroidism, unspecified: Secondary | ICD-10-CM | POA: Diagnosis not present

## 2024-01-07 DIAGNOSIS — I83018 Varicose veins of right lower extremity with ulcer other part of lower leg: Secondary | ICD-10-CM | POA: Diagnosis not present

## 2024-01-07 DIAGNOSIS — L97812 Non-pressure chronic ulcer of other part of right lower leg with fat layer exposed: Secondary | ICD-10-CM | POA: Diagnosis not present

## 2024-01-07 DIAGNOSIS — I10 Essential (primary) hypertension: Secondary | ICD-10-CM | POA: Diagnosis not present

## 2024-01-07 DIAGNOSIS — K219 Gastro-esophageal reflux disease without esophagitis: Secondary | ICD-10-CM | POA: Diagnosis not present

## 2024-01-07 DIAGNOSIS — I872 Venous insufficiency (chronic) (peripheral): Secondary | ICD-10-CM | POA: Diagnosis not present

## 2024-01-08 ENCOUNTER — Inpatient Hospital Stay: Payer: Medicare PPO | Attending: Internal Medicine | Admitting: Internal Medicine

## 2024-01-08 ENCOUNTER — Inpatient Hospital Stay: Payer: Medicare PPO

## 2024-01-08 VITALS — BP 139/68 | HR 70 | Temp 98.3°F | Resp 17 | Ht <= 58 in | Wt 90.5 lb

## 2024-01-08 DIAGNOSIS — D500 Iron deficiency anemia secondary to blood loss (chronic): Secondary | ICD-10-CM | POA: Insufficient documentation

## 2024-01-08 DIAGNOSIS — Z79899 Other long term (current) drug therapy: Secondary | ICD-10-CM | POA: Insufficient documentation

## 2024-01-08 DIAGNOSIS — K922 Gastrointestinal hemorrhage, unspecified: Secondary | ICD-10-CM | POA: Diagnosis not present

## 2024-01-08 LAB — CBC WITH DIFFERENTIAL (CANCER CENTER ONLY)
Abs Immature Granulocytes: 0.09 10*3/uL — ABNORMAL HIGH (ref 0.00–0.07)
Basophils Absolute: 0 10*3/uL (ref 0.0–0.1)
Basophils Relative: 0 %
Eosinophils Absolute: 0 10*3/uL (ref 0.0–0.5)
Eosinophils Relative: 0 %
HCT: 37.1 % (ref 36.0–46.0)
Hemoglobin: 12.2 g/dL (ref 12.0–15.0)
Immature Granulocytes: 1 %
Lymphocytes Relative: 17 %
Lymphs Abs: 1.9 10*3/uL (ref 0.7–4.0)
MCH: 31.2 pg (ref 26.0–34.0)
MCHC: 32.9 g/dL (ref 30.0–36.0)
MCV: 94.9 fL (ref 80.0–100.0)
Monocytes Absolute: 1 10*3/uL (ref 0.1–1.0)
Monocytes Relative: 8 %
Neutro Abs: 8.4 10*3/uL — ABNORMAL HIGH (ref 1.7–7.7)
Neutrophils Relative %: 74 %
Platelet Count: 244 10*3/uL (ref 150–400)
RBC: 3.91 MIL/uL (ref 3.87–5.11)
RDW: 14 % (ref 11.5–15.5)
WBC Count: 11.5 10*3/uL — ABNORMAL HIGH (ref 4.0–10.5)
nRBC: 0 % (ref 0.0–0.2)

## 2024-01-08 LAB — IRON AND IRON BINDING CAPACITY (CC-WL,HP ONLY)
Iron: 88 ug/dL (ref 28–170)
Saturation Ratios: 28 % (ref 10.4–31.8)
TIBC: 309 ug/dL (ref 250–450)
UIBC: 221 ug/dL (ref 148–442)

## 2024-01-08 LAB — FERRITIN: Ferritin: 79 ng/mL (ref 11–307)

## 2024-01-08 NOTE — Progress Notes (Signed)
 [name] [phone]   [phone]  OFFICE PROGRESS NOTE  [name], MD [address] [place] [address]  DIAGNOSIS: Iron  deficiency anemia secondary to gastrointestinal blood loss.  PRIOR THERAPY: Iron  infusion with Venofer  300 Mg IV weekly for 3 weeks.  CURRENT THERAPY: Iron  rich diet  INTERVAL HISTORY: Joan [name] 78 y.o. female returns to the clinic today for follow-up visit accompanied by her husband. Discussed the use of AI scribe software for clinical note transcription with the patient, who gave verbal consent to proceed.  History of Present Illness   Joan [name] is a 78 year old female with iron  deficiency anemia secondary to gastrointestinal blood loss who presents for evaluation and repeat blood work. She is accompanied by her husband.  She has a history of iron  deficiency anemia due to gastrointestinal blood loss and has previously been treated with iron  infusions using Venofer . Currently, she is on an iron -rich diet as she is intolerant to oral iron  tablets. She is not taking iron  pills or receiving infusions at present, and her recent blood work shows improvement in her hemoglobin and hematocrit levels.  She reports feeling 'pretty good' since her last visit. However, she mentions experiencing dizziness since undergoing cataract surgery two years ago. She denies any new complaints but notes a lack of energy. No fatigue or weakness is reported. She tries to maintain an iron -rich diet, consuming foods like broccoli occasionally.       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 Lumbo-sacral anterolisthesis     Senile osteoporosis 04/01/03   Sprain of metatarsophalangeal (joint) of foot    Thyrotoxicosis    Thyrotoxicosis without mention of goiter or other cause, without mention of thyrotoxic crisis or storm    Unspecified vitamin D  deficiency    Urinary frequency     ALLERGIES:  is allergic to arava [leflunomide], penicillins, clindamycin , methotrexate, penicillamine, methotrexate derivatives, and neurontin [gabapentin].  MEDICATIONS:  Current Outpatient Medications  Medication Sig Dispense Refill   acetaminophen  (TYLENOL ) 650 MG CR tablet Take 1,300 mg by mouth at bedtime.     Biotin  1000 MCG tablet Take 1 tablet (1 mg total) by mouth 2 (two) times daily. 60 tablet 3   budesonide  (ENTOCORT EC ) 3 MG 24 hr capsule Take 3 capsules (9 mg total) by mouth daily. 270 capsule 3   Calcium  Carb-Cholecalciferol  (CALTRATE 600+D3) 600-800 MG-UNIT TABS Take 1 tablet by mouth 2 (two) times daily. 60 tablet 11   Cholecalciferol  (VITAMIN D3) 50 MCG (2000 UT) capsule Take 1 capsule (2,000 Units total) by mouth  daily. 30 capsule 3   cycloSPORINE  (RESTASIS ) 0.05 % ophthalmic emulsion Place 1 drop into both eyes 2 (two) times daily.     denosumab  (PROLIA ) 60 MG/ML SOSY injection Inject 60 mg into the skin every 6 (six) months. 1 mL 1   diphenoxylate -atropine  (LOMOTIL ) 2.5-0.025 MG tablet Take 1-2 tablets by mouth every 8 (eight) hours as needed. 60 tablet 3    etanercept (ENBREL) 50 MG/ML injection Inject 50 mg into the skin once a week.     fluticasone  (FLONASE ) 50 MCG/ACT nasal spray Place 1 spray into both nostrils as needed for allergies or rhinitis.     folic acid  (FOLVITE ) 1 MG tablet Take 1 mg by mouth daily.     Glucosamine-Chondroit-Vit C-Mn (GLUCOSAMINE CHONDR 500 COMPLEX PO) Take 2 tablets by mouth every other day.     losartan  (COZAAR ) 50 MG tablet Take 1 tablet by mouth once daily 90 tablet 0   Menthol , Topical Analgesic, (ASPERCREME MAX ROLL-ON EX) Apply topically as needed.     mirtazapine  (REMERON ) 7.5 MG tablet Take 1 tablet (7.5 mg total) by mouth at bedtime. 30 tablet 2   Multiple Vitamin (MULTIVITAMIN) tablet Take 1 tablet by mouth daily.     mupirocin  ointment (BACTROBAN ) 2 % Apply 1 Application topically as needed.     naproxen  (NAPROSYN ) 500 MG tablet Take 250 mg by mouth daily.     Omega-3 Fatty Acids (FISH OIL) 1000 MG CAPS Take 1,000 mg by mouth daily.     omeprazole  (PRILOSEC) 40 MG capsule Take 40 mg by mouth as needed.     predniSONE  (DELTASONE ) 5 MG tablet TAKE 1 TABLET BY MOUTH WITH BREAKFAST 90 tablet 0   Propylene Glycol 0.6 % SOLN Place 1 drop into both eyes 2 (two) times daily as needed (dry eyes).     sertraline  (ZOLOFT ) 50 MG tablet Take 1 tablet (50 mg total) by mouth daily. (Patient not taking: Reported on 11/28/2023) 90 tablet 3   TIROSINT  50 MCG CAPS Take 1 tablet by mouth as directed. 5 times weekly. NOT ON SATURDAY & SUNDAY     traMADol  (ULTRAM ) 50 MG tablet Take 1 tablet (50 mg total) by mouth 2 (two) times daily as needed. 60 tablet 5   tretinoin (RETIN-A) 0.025 % cream      urea  (CARMOL) 10 % cream Apply topically as needed. 71 g 0   zinc  gluconate 50 MG tablet Take by mouth as needed.     No current facility-administered medications for this visit.    SURGICAL HISTORY:  Past Surgical History:  Procedure Laterality Date   COLONOSCOPY     COLONOSCOPY N/A 04/15/2015   Procedure: COLONOSCOPY;   Surgeon: Kenney Peacemaker, MD;  Location: [facility] ENDOSCOPY;  Service: Endoscopy;  Laterality: N/A;   DG FINGERS, MULTIPLE LT HAND ([facility] HX)     EYE SURGERY     FOOT SURGERY Bilateral 05/22/2014   Dr.Strom    LUMBAR DISC SURGERY  12/12/2017   Dr Larrie Po   REMOVE GANGLION  1998   DR [facility]    RIGHT WRIST SUGERY FOR TENOSYNOVITIS  1997   DR SYPHER    TONSILLECTOMY AND ADENOIDECTOMY  1974   TUBAL LIGATION  1973    REVIEW OF SYSTEMS:  A comprehensive review of systems was negative except for: Constitutional: positive for fatigue   PHYSICAL EXAMINATION: General appearance: alert, cooperative, and no distress Head: Normocephalic, without obvious abnormality, atraumatic Neck: no adenopathy, no JVD, supple, symmetrical, trachea midline, and thyroid  not  enlarged, symmetric, no tenderness/mass/nodules Lymph nodes: Cervical, supraclavicular, and axillary nodes normal. Resp: clear to auscultation bilaterally Back: symmetric, no curvature. ROM normal. No CVA tenderness. Cardio: regular rate and rhythm, S1, S2 normal, no murmur, click, rub or gallop GI: soft, non-tender; bowel sounds normal; no masses,  no organomegaly Extremities: extremities normal, atraumatic, no cyanosis or edema  ECOG PERFORMANCE STATUS: 1 - Symptomatic but completely ambulatory  Blood pressure 139/68, pulse 70, temperature 98.3 F (36.8 C), temperature source Temporal, resp. rate 17, height 4\' 8"  (1.422 m), weight 90 lb 8 oz (41.1 kg), SpO2 100%.  LABORATORY DATA: Lab Results  Component Value Date   WBC 11.5 (H) 01/08/2024   HGB 12.2 01/08/2024   HCT 37.1 01/08/2024   MCV 94.9 01/08/2024   PLT 244 01/08/2024      Chemistry      Component Value Date/Time   NA 140 10/31/2023 0952   NA 141 03/20/2023 0000   K 4.4 10/31/2023 0952   CL 105 10/31/2023 0952   CO2 27 10/31/2023 0952   BUN 32 (H) 10/31/2023 0952   BUN 13 02/02/2016 0840   CREATININE 1.07 (H) 10/31/2023 0952   GLU 90 03/20/2023 0000      Component Value  Date/Time   CALCIUM  8.5 (L) 10/31/2023 0952   ALKPHOS 47 04/11/2022 1309   AST 13 10/31/2023 0952   AST 14 (L) 04/11/2022 1309   ALT 12 10/31/2023 0952   ALT 11 04/11/2022 1309   BILITOT 0.3 10/31/2023 0952   BILITOT 0.3 04/11/2022 1309       RADIOGRAPHIC STUDIES: No results found.  ASSESSMENT AND PLAN: This is a very pleasant 78 years old white female with history of iron  deficiency anemia secondary to gastrointestinal blood loss.  The patient has intolerance to the oral iron  tablets. She was treated with iron  infusion with Venofer  300 mg IV weekly for 3 weeks and tolerated it well.  The patient is currently on observation with iron  rich diet.    Iron  deficiency anemia Iron  deficiency anemia secondary to gastrointestinal blood loss. Previously treated with Venofer  infusions and currently managed with an iron -rich diet due to intolerance to oral iron  tablets. Recent blood work shows improvement with hemoglobin at 12.2 and hematocrit at 31.1, indicating no current anemia. Awaiting iron  studies to determine further management. White blood cell count is elevated at 11.5 but improving, and platelet count is normal at 244. - Await results of iron  studies. - If iron  levels are low, contact for possible iron  infusion or oral iron  therapy. She prefers oral iron  supplements if needed. - Schedule follow-up appointment in four months.   The patient was advised to call immediately if she has any concerning symptoms in the interval.  The patient voices understanding of current disease status and treatment options and is in agreement with the current care plan. All questions were answered. The patient knows to call the clinic with any problems, questions or concerns. We can certainly see the patient much sooner if necessary.  The total time spent in the appointment was 20 minutes.  Disclaimer: This note was dictated with voice recognition software. Similar sounding words can inadvertently be  transcribed and may not be corrected upon review.

## 2024-01-14 DIAGNOSIS — M069 Rheumatoid arthritis, unspecified: Secondary | ICD-10-CM | POA: Diagnosis not present

## 2024-01-14 DIAGNOSIS — E039 Hypothyroidism, unspecified: Secondary | ICD-10-CM | POA: Diagnosis not present

## 2024-01-14 DIAGNOSIS — Z7952 Long term (current) use of systemic steroids: Secondary | ICD-10-CM | POA: Diagnosis not present

## 2024-01-14 DIAGNOSIS — I10 Essential (primary) hypertension: Secondary | ICD-10-CM | POA: Diagnosis not present

## 2024-01-14 DIAGNOSIS — I872 Venous insufficiency (chronic) (peripheral): Secondary | ICD-10-CM | POA: Diagnosis not present

## 2024-01-14 DIAGNOSIS — K219 Gastro-esophageal reflux disease without esophagitis: Secondary | ICD-10-CM | POA: Diagnosis not present

## 2024-01-14 DIAGNOSIS — L97815 Non-pressure chronic ulcer of other part of right lower leg with muscle involvement without evidence of necrosis: Secondary | ICD-10-CM | POA: Diagnosis not present

## 2024-01-14 DIAGNOSIS — Z79899 Other long term (current) drug therapy: Secondary | ICD-10-CM | POA: Diagnosis not present

## 2024-01-14 DIAGNOSIS — I83018 Varicose veins of right lower extremity with ulcer other part of lower leg: Secondary | ICD-10-CM | POA: Diagnosis not present

## 2024-01-21 DIAGNOSIS — I10 Essential (primary) hypertension: Secondary | ICD-10-CM | POA: Diagnosis not present

## 2024-01-21 DIAGNOSIS — L97815 Non-pressure chronic ulcer of other part of right lower leg with muscle involvement without evidence of necrosis: Secondary | ICD-10-CM | POA: Diagnosis not present

## 2024-01-21 DIAGNOSIS — E039 Hypothyroidism, unspecified: Secondary | ICD-10-CM | POA: Diagnosis not present

## 2024-01-21 DIAGNOSIS — L97818 Non-pressure chronic ulcer of other part of right lower leg with other specified severity: Secondary | ICD-10-CM | POA: Diagnosis not present

## 2024-01-21 DIAGNOSIS — Z7952 Long term (current) use of systemic steroids: Secondary | ICD-10-CM | POA: Diagnosis not present

## 2024-01-21 DIAGNOSIS — M069 Rheumatoid arthritis, unspecified: Secondary | ICD-10-CM | POA: Diagnosis not present

## 2024-01-21 DIAGNOSIS — K219 Gastro-esophageal reflux disease without esophagitis: Secondary | ICD-10-CM | POA: Diagnosis not present

## 2024-01-21 DIAGNOSIS — I83018 Varicose veins of right lower extremity with ulcer other part of lower leg: Secondary | ICD-10-CM | POA: Diagnosis not present

## 2024-01-21 DIAGNOSIS — I872 Venous insufficiency (chronic) (peripheral): Secondary | ICD-10-CM | POA: Diagnosis not present

## 2024-01-21 DIAGNOSIS — Z79899 Other long term (current) drug therapy: Secondary | ICD-10-CM | POA: Diagnosis not present

## 2024-01-21 DIAGNOSIS — R600 Localized edema: Secondary | ICD-10-CM | POA: Diagnosis not present

## 2024-01-24 DIAGNOSIS — M6240 Contracture of muscle, unspecified site: Secondary | ICD-10-CM | POA: Diagnosis not present

## 2024-01-24 DIAGNOSIS — R636 Underweight: Secondary | ICD-10-CM | POA: Diagnosis not present

## 2024-01-24 DIAGNOSIS — I83013 Varicose veins of right lower extremity with ulcer of ankle: Secondary | ICD-10-CM | POA: Diagnosis not present

## 2024-01-24 DIAGNOSIS — L97313 Non-pressure chronic ulcer of right ankle with necrosis of muscle: Secondary | ICD-10-CM | POA: Diagnosis not present

## 2024-01-24 DIAGNOSIS — Z681 Body mass index (BMI) 19 or less, adult: Secondary | ICD-10-CM | POA: Diagnosis not present

## 2024-01-24 DIAGNOSIS — I10 Essential (primary) hypertension: Secondary | ICD-10-CM | POA: Diagnosis not present

## 2024-01-24 DIAGNOSIS — R2681 Unsteadiness on feet: Secondary | ICD-10-CM | POA: Diagnosis not present

## 2024-01-29 DIAGNOSIS — Z79899 Other long term (current) drug therapy: Secondary | ICD-10-CM | POA: Diagnosis not present

## 2024-01-29 DIAGNOSIS — I872 Venous insufficiency (chronic) (peripheral): Secondary | ICD-10-CM | POA: Diagnosis not present

## 2024-01-29 DIAGNOSIS — R600 Localized edema: Secondary | ICD-10-CM | POA: Diagnosis not present

## 2024-01-29 DIAGNOSIS — I83018 Varicose veins of right lower extremity with ulcer other part of lower leg: Secondary | ICD-10-CM | POA: Diagnosis not present

## 2024-01-29 DIAGNOSIS — M069 Rheumatoid arthritis, unspecified: Secondary | ICD-10-CM | POA: Diagnosis not present

## 2024-01-29 DIAGNOSIS — K219 Gastro-esophageal reflux disease without esophagitis: Secondary | ICD-10-CM | POA: Diagnosis not present

## 2024-01-29 DIAGNOSIS — L97815 Non-pressure chronic ulcer of other part of right lower leg with muscle involvement without evidence of necrosis: Secondary | ICD-10-CM | POA: Diagnosis not present

## 2024-01-29 DIAGNOSIS — Z7952 Long term (current) use of systemic steroids: Secondary | ICD-10-CM | POA: Diagnosis not present

## 2024-01-29 DIAGNOSIS — I10 Essential (primary) hypertension: Secondary | ICD-10-CM | POA: Diagnosis not present

## 2024-01-29 DIAGNOSIS — E039 Hypothyroidism, unspecified: Secondary | ICD-10-CM | POA: Diagnosis not present

## 2024-01-30 ENCOUNTER — Ambulatory Visit: Admitting: Sports Medicine

## 2024-02-04 DIAGNOSIS — I83018 Varicose veins of right lower extremity with ulcer other part of lower leg: Secondary | ICD-10-CM | POA: Diagnosis not present

## 2024-02-04 DIAGNOSIS — L97818 Non-pressure chronic ulcer of other part of right lower leg with other specified severity: Secondary | ICD-10-CM | POA: Diagnosis not present

## 2024-02-19 ENCOUNTER — Ambulatory Visit: Admitting: Internal Medicine

## 2024-02-19 ENCOUNTER — Encounter: Payer: Self-pay | Admitting: Internal Medicine

## 2024-02-19 VITALS — BP 110/60 | HR 74 | Ht <= 58 in | Wt 90.0 lb

## 2024-02-19 DIAGNOSIS — Z8711 Personal history of peptic ulcer disease: Secondary | ICD-10-CM

## 2024-02-19 DIAGNOSIS — K529 Noninfective gastroenteritis and colitis, unspecified: Secondary | ICD-10-CM | POA: Diagnosis not present

## 2024-02-19 DIAGNOSIS — K52838 Other microscopic colitis: Secondary | ICD-10-CM

## 2024-02-19 DIAGNOSIS — R1310 Dysphagia, unspecified: Secondary | ICD-10-CM | POA: Diagnosis not present

## 2024-02-19 DIAGNOSIS — K52831 Collagenous colitis: Secondary | ICD-10-CM

## 2024-02-19 DIAGNOSIS — K222 Esophageal obstruction: Secondary | ICD-10-CM | POA: Diagnosis not present

## 2024-02-19 DIAGNOSIS — D509 Iron deficiency anemia, unspecified: Secondary | ICD-10-CM | POA: Diagnosis not present

## 2024-02-19 DIAGNOSIS — K219 Gastro-esophageal reflux disease without esophagitis: Secondary | ICD-10-CM | POA: Diagnosis not present

## 2024-02-19 DIAGNOSIS — K52839 Microscopic colitis, unspecified: Secondary | ICD-10-CM | POA: Diagnosis not present

## 2024-02-19 MED ORDER — BUDESONIDE 3 MG PO CPEP: 9.0000 mg | ORAL_CAPSULE | Freq: Every day | ORAL | 3 refills | Status: AC

## 2024-02-19 MED ORDER — OMEPRAZOLE 40 MG PO CPDR: 40.0000 mg | DELAYED_RELEASE_CAPSULE | ORAL | 3 refills | Status: AC | PRN

## 2024-02-19 MED ORDER — DIPHENOXYLATE-ATROPINE 2.5-0.025 MG PO TABS: 1.0000 | ORAL_TABLET | Freq: Three times a day (TID) | ORAL | 3 refills | Status: AC | PRN

## 2024-02-19 NOTE — Patient Instructions (Signed)
 We have sent the following medications to your pharmacy for you to pick up at your convenience: budesonide , Omeprazole , Lomotil   Please follow up in 6 months.  _______________________________________________________  If your blood pressure at your visit was 140/90 or greater, please contact your primary care physician to follow up on this.  _______________________________________________________  If you are age 77 or older, your body mass index should be between 23-30. Your Body mass index is 20.18 kg/m. If this is out of the aforementioned range listed, please consider follow up with your Primary Care Provider.  If you are age 87 or younger, your body mass index should be between 19-25. Your Body mass index is 20.18 kg/m. If this is out of the aformentioned range listed, please consider follow up with your Primary Care Provider.   ________________________________________________________  The [facility] providers would like to encourage you to use MYCHART to communicate with providers for non-urgent requests or questions.  Due to long hold times on the telephone, sending your provider a message by [facility] may be a faster and more efficient way to get a response.  Please allow 48 business hours for a response.  Please remember that this is for non-urgent requests.  _______________________________________________________

## 2024-02-19 NOTE — Progress Notes (Signed)
 HISTORY OF PRESENT ILLNESS:  Joan [name] is a 78 y.o. female  with multiple medical problems including deforming rheumatoid arthritis who presents today for follow-up regarding the following problems:   1.  Microscopic colitis diagnosed in December 2020.  Chronic diarrhea. 2.  GERD complicated by peptic stricture.   3.  History of iron  deficiency anemia secondary to NSAID induced gastric ulcer  She presents today for ongoing management of her GI issues, has complaints of pill dysphagia, and request medication refills.  She is accompanied by her husband.  The patient tells me that she has been stable since her last visit.  She has chronic issues with dizziness and low energy.  Her bowel habits have been better.  She describes 2 formed bowel movements most days.  Averaging 6 mg of budesonide  daily.  Rarely needs Lomotil .  Terms of GERD, symptoms controlled with PPI (omeprazole  40 mg daily).  No further problems with solid food dysphagia.  She continues to have pill dysphagia.  Her last endoscopy revealed peptic stricture.  Also, tight UES.  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 Lumbo-sacral anterolisthesis     Senile osteoporosis 04/01/03   Sprain of metatarsophalangeal (joint) of foot    Thyrotoxicosis    Thyrotoxicosis without mention of goiter or other cause, without mention of thyrotoxic crisis or storm    Unspecified vitamin D  deficiency    Urinary frequency     Past Surgical History:  Procedure Laterality Date   COLONOSCOPY     COLONOSCOPY N/A 04/15/2015   Procedure: COLONOSCOPY;  Surgeon: Kenney Peacemaker, MD;  Location: [facility] ENDOSCOPY;  Service: Endoscopy;  Laterality: N/A;   DG FINGERS, MULTIPLE LT HAND ([facility] HX)     EYE SURGERY     FOOT SURGERY Bilateral 05/22/2014   Dr.Strom    LUMBAR DISC SURGERY  12/12/2017   Dr Larrie Po   REMOVE GANGLION  1998   DR [facility]    RIGHT WRIST SUGERY FOR TENOSYNOVITIS  1997   DR SYPHER    TONSILLECTOMY AND ADENOIDECTOMY  1974   TUBAL LIGATION  1973    Social History Hazelle Cheek Dawkins  reports that she has never smoked. She has never used smokeless tobacco. She reports that she does not drink alcohol  and does not use drugs.  family history includes Heart attack in her father; Heart disease in her father.  Allergies  Allergen Reactions   Arava [Leflunomide] Diarrhea, Nausea Only and Other (See Comments)    Loss appetite   Penicillins Other (See Comments) and  Rash    PATIENT HAS HAD A PCN REACTION WITH IMMEDIATE RASH, FACIAL/TONGUE/THROAT SWELLING, SOB, OR LIGHTHEADEDNESS WITH HYPOTENSION:  #  #  YES  #  #   Has patient had a PCN reaction causing severe rash involving mucus membranes or skin necrosis: no  Has patient had a PCN reaction that required hospitalization: no   Clindamycin  Other (See Comments)    Trouble swallowing   Methotrexate Other (See Comments)   Penicillamine Other (See Comments)   Methotrexate Derivatives Other (See Comments)    Hair loss   Neurontin [Gabapentin] Nausea And Vomiting       PHYSICAL  EXAMINATION: Vital signs: BP 110/60   Pulse 74   Ht 4' 8 (1.422 m)   Wt 90 lb (40.8 kg)   BMI 20.18 kg/m   Constitutional: Frail thin chronically ill-appearing female, no acute distress Psychiatric: alert and oriented x3, cooperative Eyes: extraocular movements intact, anicteric, conjunctiva pink Mouth: oral pharynx moist, no lesions Neck: supple no lymphadenopathy Cardiovascular: heart regular rate and rhythm, no murmur Lungs: clear to auscultation bilaterally Abdomen: soft, nontender, nondistended, no obvious ascites, no peritoneal signs, normal bowel sounds, no organomegaly.  Midline diastases Rectal: Omitted Extremities: no clubbing or cyanosis.  Trace lower extremity edema bilaterally Skin: no lesions on visible extremities save ecchymoses Neuro: No focal deficits.  Cranial nerves intact  ASSESSMENT:  1.  Microscopic colitis with associated diarrhea.  Currently managed with budesonide  and occasional Lomotil  2.  GERD complicated by peptic stricture.  No active GERD symptoms on PPI.  Esophageal dysphagia resolved. 3.  Pill dysphagia secondary to tight UES 4.  History of gastric ulcers with associated anemia 5. Multiple significant medical problems   PLAN:  1.  Refill budesonide .  Medication risks reviewed 2.  Refill Lomotil .  Medication risks reviewed 3.  Reflux precautions 4.  Refill omeprazole .  Medication risks reviewed 5.  Recommend cutting her polarizing pills (if okay with pharmacist) as well as chin tuck maneuver. 6.  Resume general medical care with PCP and other specialist 7.  GI follow-up 6 months

## 2024-02-26 DIAGNOSIS — I83018 Varicose veins of right lower extremity with ulcer other part of lower leg: Secondary | ICD-10-CM | POA: Diagnosis not present

## 2024-02-26 DIAGNOSIS — L97818 Non-pressure chronic ulcer of other part of right lower leg with other specified severity: Secondary | ICD-10-CM | POA: Diagnosis not present

## 2024-03-04 ENCOUNTER — Ambulatory Visit: Admitting: *Deleted

## 2024-03-04 VITALS — BP 117/70 | HR 74 | Temp 98.4°F | Resp 18 | Ht <= 58 in | Wt 90.6 lb

## 2024-03-04 DIAGNOSIS — M7022 Olecranon bursitis, left elbow: Secondary | ICD-10-CM | POA: Diagnosis not present

## 2024-03-04 DIAGNOSIS — M21241 Flexion deformity, right finger joints: Secondary | ICD-10-CM | POA: Diagnosis not present

## 2024-03-04 DIAGNOSIS — M810 Age-related osteoporosis without current pathological fracture: Secondary | ICD-10-CM

## 2024-03-04 DIAGNOSIS — D508 Other iron deficiency anemias: Secondary | ICD-10-CM | POA: Diagnosis not present

## 2024-03-04 DIAGNOSIS — M5136 Other intervertebral disc degeneration, lumbar region with discogenic back pain only: Secondary | ICD-10-CM | POA: Diagnosis not present

## 2024-03-04 DIAGNOSIS — Z111 Encounter for screening for respiratory tuberculosis: Secondary | ICD-10-CM | POA: Diagnosis not present

## 2024-03-04 DIAGNOSIS — M0579 Rheumatoid arthritis with rheumatoid factor of multiple sites without organ or systems involvement: Secondary | ICD-10-CM | POA: Diagnosis not present

## 2024-03-04 DIAGNOSIS — R5383 Other fatigue: Secondary | ICD-10-CM | POA: Diagnosis not present

## 2024-03-04 DIAGNOSIS — N1831 Chronic kidney disease, stage 3a: Secondary | ICD-10-CM | POA: Diagnosis not present

## 2024-03-04 DIAGNOSIS — M1991 Primary osteoarthritis, unspecified site: Secondary | ICD-10-CM | POA: Diagnosis not present

## 2024-03-04 MED ORDER — DENOSUMAB 60 MG/ML ~~LOC~~ SOSY
60.0000 mg | PREFILLED_SYRINGE | Freq: Once | SUBCUTANEOUS | Status: AC
  Administered 2024-03-04: 60 mg via SUBCUTANEOUS
  Filled 2024-03-04: qty 1

## 2024-03-04 NOTE — Progress Notes (Signed)
 Diagnosis: Osteoporosis  Provider:  Mannam, Praveen MD  Procedure: Injection  Prolia  (Denosumab ), Dose: 60 mg, Site: subcutaneous, Number of injections: 1  Injection Site(s): Left arm  Post Care: Patient declined observation  Discharge: Condition: Good, Destination: Home . AVS Provided  Performed by:  Mathew Therisa NOVAK, RN

## 2024-03-06 ENCOUNTER — Ambulatory Visit

## 2024-03-06 ENCOUNTER — Ambulatory Visit: Payer: Medicare PPO

## 2024-03-06 ENCOUNTER — Ambulatory Visit: Admitting: Cardiology

## 2024-03-09 LAB — LAB REPORT - SCANNED: EGFR: 60

## 2024-05-06 ENCOUNTER — Telehealth: Payer: Self-pay | Admitting: Internal Medicine

## 2024-05-12 ENCOUNTER — Inpatient Hospital Stay

## 2024-05-12 ENCOUNTER — Inpatient Hospital Stay: Admitting: Internal Medicine

## 2024-05-13 ENCOUNTER — Ambulatory Visit: Admitting: Cardiology

## 2024-05-29 ENCOUNTER — Ambulatory Visit: Admitting: Cardiology

## 2024-06-24 DIAGNOSIS — H60393 Other infective otitis externa, bilateral: Secondary | ICD-10-CM | POA: Diagnosis not present

## 2024-06-24 DIAGNOSIS — H6123 Impacted cerumen, bilateral: Secondary | ICD-10-CM | POA: Diagnosis not present

## 2024-06-24 DIAGNOSIS — I110 Hypertensive heart disease with heart failure: Secondary | ICD-10-CM | POA: Diagnosis not present

## 2024-06-24 DIAGNOSIS — M0579 Rheumatoid arthritis with rheumatoid factor of multiple sites without organ or systems involvement: Secondary | ICD-10-CM | POA: Diagnosis not present

## 2024-06-24 DIAGNOSIS — N1831 Chronic kidney disease, stage 3a: Secondary | ICD-10-CM | POA: Diagnosis not present

## 2024-06-24 DIAGNOSIS — M6240 Contracture of muscle, unspecified site: Secondary | ICD-10-CM | POA: Diagnosis not present

## 2024-06-24 DIAGNOSIS — M810 Age-related osteoporosis without current pathological fracture: Secondary | ICD-10-CM | POA: Diagnosis not present

## 2024-06-24 DIAGNOSIS — Z23 Encounter for immunization: Secondary | ICD-10-CM | POA: Diagnosis not present

## 2024-06-24 DIAGNOSIS — Z0000 Encounter for general adult medical examination without abnormal findings: Secondary | ICD-10-CM | POA: Diagnosis not present

## 2024-07-11 ENCOUNTER — Other Ambulatory Visit (HOSPITAL_COMMUNITY): Payer: Self-pay | Admitting: Internal Medicine

## 2024-07-11 ENCOUNTER — Telehealth (HOSPITAL_COMMUNITY): Payer: Self-pay

## 2024-07-11 NOTE — Telephone Encounter (Signed)
 Auth Submission: APPROVED Site of care: Site of care: [facility] Payer: [employer] Medication & CPT/J Code(s) submitted: Prolia  (Denosumab ) [id number] Diagnosis Code: M81.0 Route of submission (phone, fax, portal):  Phone # Fax # Auth type: Buy/Bill HB Units/visits requested: 60mg  q55months x 2 doses Reference number: [id number] Approval from: 09/04/24 to 09/03/25   Approval letter has been scanned into media tab.

## 2024-07-21 ENCOUNTER — Ambulatory Visit: Admitting: Cardiology

## 2024-07-22 DIAGNOSIS — E538 Deficiency of other specified B group vitamins: Secondary | ICD-10-CM | POA: Diagnosis not present

## 2024-07-22 DIAGNOSIS — E785 Hyperlipidemia, unspecified: Secondary | ICD-10-CM | POA: Diagnosis not present

## 2024-07-22 DIAGNOSIS — M810 Age-related osteoporosis without current pathological fracture: Secondary | ICD-10-CM | POA: Diagnosis not present

## 2024-07-22 DIAGNOSIS — Z131 Encounter for screening for diabetes mellitus: Secondary | ICD-10-CM | POA: Diagnosis not present

## 2024-07-29 DIAGNOSIS — Z681 Body mass index (BMI) 19 or less, adult: Secondary | ICD-10-CM | POA: Diagnosis not present

## 2024-07-29 DIAGNOSIS — M47816 Spondylosis without myelopathy or radiculopathy, lumbar region: Secondary | ICD-10-CM | POA: Diagnosis not present

## 2024-08-14 ENCOUNTER — Telehealth: Payer: Self-pay

## 2024-08-14 NOTE — Telephone Encounter (Signed)
 Auth Submission: APPROVED Site of care: Site of care: [facility] [place] Payer: [employer] medicare Medication & CPT/J Code(s) submitted: Prolia  (Denosumab ) [id number] Diagnosis Code:  Route of submission (phone, fax, portal): portal Phone # Fax # Auth type: Buy/Bill PB Units/visits requested: 60mg  q57months x 2 doses Reference number: [id number] Approval from: 09/05/23 to 09/03/25

## 2024-09-09 ENCOUNTER — Ambulatory Visit

## 2024-09-22 ENCOUNTER — Ambulatory Visit

## 2024-10-06 ENCOUNTER — Ambulatory Visit

## 2024-10-14 ENCOUNTER — Ambulatory Visit

## 2024-10-21 ENCOUNTER — Ambulatory Visit

## 2024-10-22 ENCOUNTER — Ambulatory Visit: Admitting: Cardiology

## 2025-01-06 ENCOUNTER — Ambulatory Visit: Admitting: Cardiology
